# Patient Record
Sex: Female | Born: 1945 | Race: White | Hispanic: No | Smoking: Never smoker
Health system: Southern US, Community
[De-identification: ages and names within clinical notes are randomized; demographics above are authoritative.]

## PROBLEM LIST (undated history)

## (undated) DIAGNOSIS — K449 Diaphragmatic hernia without obstruction or gangrene: Secondary | ICD-10-CM

## (undated) DIAGNOSIS — R112 Nausea with vomiting, unspecified: Secondary | ICD-10-CM

## (undated) DIAGNOSIS — E785 Hyperlipidemia, unspecified: Secondary | ICD-10-CM

## (undated) DIAGNOSIS — K760 Fatty (change of) liver, not elsewhere classified: Secondary | ICD-10-CM

## (undated) DIAGNOSIS — J069 Acute upper respiratory infection, unspecified: Secondary | ICD-10-CM

## (undated) DIAGNOSIS — J018 Other acute sinusitis: Secondary | ICD-10-CM

## (undated) DIAGNOSIS — R0989 Other specified symptoms and signs involving the circulatory and respiratory systems: Secondary | ICD-10-CM

## (undated) DIAGNOSIS — I10 Essential (primary) hypertension: Secondary | ICD-10-CM

## (undated) DIAGNOSIS — Z8719 Personal history of other diseases of the digestive system: Secondary | ICD-10-CM

## (undated) DIAGNOSIS — H269 Unspecified cataract: Secondary | ICD-10-CM

## (undated) DIAGNOSIS — M1990 Unspecified osteoarthritis, unspecified site: Secondary | ICD-10-CM

## (undated) DIAGNOSIS — G4700 Insomnia, unspecified: Secondary | ICD-10-CM

## (undated) DIAGNOSIS — E7800 Pure hypercholesterolemia, unspecified: Secondary | ICD-10-CM

## (undated) DIAGNOSIS — R5381 Other malaise: Secondary | ICD-10-CM

## (undated) DIAGNOSIS — K648 Other hemorrhoids: Secondary | ICD-10-CM

## (undated) DIAGNOSIS — Z0000 Encounter for general adult medical examination without abnormal findings: Secondary | ICD-10-CM

## (undated) DIAGNOSIS — R5383 Other fatigue: Secondary | ICD-10-CM

## (undated) DIAGNOSIS — F5104 Psychophysiologic insomnia: Secondary | ICD-10-CM

## (undated) DIAGNOSIS — Z1211 Encounter for screening for malignant neoplasm of colon: Secondary | ICD-10-CM

## (undated) DIAGNOSIS — H35372 Puckering of macula, left eye: Secondary | ICD-10-CM

## (undated) DIAGNOSIS — Z86010 Personal history of colonic polyps: Secondary | ICD-10-CM

## (undated) DIAGNOSIS — Z780 Asymptomatic menopausal state: Secondary | ICD-10-CM

## (undated) DIAGNOSIS — I499 Cardiac arrhythmia, unspecified: Secondary | ICD-10-CM

## (undated) DIAGNOSIS — I480 Paroxysmal atrial fibrillation: Secondary | ICD-10-CM

## (undated) DIAGNOSIS — G4730 Sleep apnea, unspecified: Secondary | ICD-10-CM

## (undated) DIAGNOSIS — R011 Cardiac murmur, unspecified: Secondary | ICD-10-CM

## (undated) DIAGNOSIS — G2581 Restless legs syndrome: Secondary | ICD-10-CM

## (undated) DIAGNOSIS — K5790 Diverticulosis of intestine, part unspecified, without perforation or abscess without bleeding: Secondary | ICD-10-CM

## (undated) DIAGNOSIS — J189 Pneumonia, unspecified organism: Secondary | ICD-10-CM

## (undated) DIAGNOSIS — K219 Gastro-esophageal reflux disease without esophagitis: Secondary | ICD-10-CM

## (undated) DIAGNOSIS — Z9889 Other specified postprocedural states: Secondary | ICD-10-CM

## (undated) DIAGNOSIS — Z13220 Encounter for screening for lipoid disorders: Secondary | ICD-10-CM

## (undated) DIAGNOSIS — K635 Polyp of colon: Secondary | ICD-10-CM

## (undated) HISTORY — DX: Fatty (change of) liver, not elsewhere classified: K76.0

## (undated) HISTORY — PX: COLONOSCOPY: SHX174

## (undated) HISTORY — DX: Other specified symptoms and signs involving the circulatory and respiratory systems: R09.89

## (undated) HISTORY — DX: Encounter for screening for lipoid disorders: Z13.220

## (undated) HISTORY — DX: Encounter for general adult medical examination without abnormal findings: Z00.00

## (undated) HISTORY — DX: Diverticulosis of intestine, part unspecified, without perforation or abscess without bleeding: K57.90

## (undated) HISTORY — PX: OTHER SURGICAL HISTORY: SHX169

## (undated) HISTORY — DX: Insomnia, unspecified: G47.00

## (undated) HISTORY — PX: TUBAL LIGATION: SHX77

## (undated) HISTORY — DX: Hyperlipidemia, unspecified: E78.5

## (undated) HISTORY — DX: Asymptomatic menopausal state: Z78.0

## (undated) HISTORY — DX: Personal history of colonic polyps: Z86.010

## (undated) HISTORY — PX: EYE SURGERY: SHX253

## (undated) HISTORY — DX: Other fatigue: R53.81

## (undated) HISTORY — PX: KNEE ARTHROSCOPY: SUR90

## (undated) HISTORY — DX: Sleep apnea, unspecified: G47.30

## (undated) HISTORY — DX: Diaphragmatic hernia without obstruction or gangrene: K44.9

## (undated) HISTORY — PX: UPPER GI ENDOSCOPY: SHX6162

## (undated) HISTORY — DX: Other acute sinusitis: J01.80

## (undated) HISTORY — DX: Polyp of colon: K63.5

## (undated) HISTORY — DX: Essential (primary) hypertension: I10

## (undated) HISTORY — PX: CHOLECYSTECTOMY: SHX55

## (undated) HISTORY — DX: Acute upper respiratory infection, unspecified: J06.9

## (undated) HISTORY — DX: Other malaise: R53.83

## (undated) HISTORY — DX: Puckering of macula, left eye: H35.372

## (undated) HISTORY — DX: Other hemorrhoids: K64.8

## (undated) HISTORY — PX: PARS PLANA VITRECTOMY W/ REPAIR OF MACULAR HOLE: SHX2170

## (undated) HISTORY — DX: Unspecified cataract: H26.9

## (undated) HISTORY — DX: Psychophysiologic insomnia: F51.04

## (undated) HISTORY — DX: Restless legs syndrome: G25.81

## (undated) HISTORY — DX: Paroxysmal atrial fibrillation: I48.0

## (undated) HISTORY — DX: Pure hypercholesterolemia, unspecified: E78.00

## (undated) HISTORY — DX: Encounter for screening for malignant neoplasm of colon: Z12.11

---

## 1999-09-15 ENCOUNTER — Other Ambulatory Visit: Admission: RE | Admit: 1999-09-15 | Discharge: 1999-09-15 | Payer: Self-pay | Admitting: Obstetrics and Gynecology

## 1999-09-17 ENCOUNTER — Encounter: Payer: Self-pay | Admitting: Obstetrics and Gynecology

## 1999-09-17 ENCOUNTER — Ambulatory Visit (HOSPITAL_COMMUNITY): Admission: RE | Admit: 1999-09-17 | Discharge: 1999-09-17 | Payer: Self-pay | Admitting: Obstetrics and Gynecology

## 1999-09-19 DIAGNOSIS — D259 Leiomyoma of uterus, unspecified: Secondary | ICD-10-CM

## 1999-09-19 HISTORY — DX: Leiomyoma of uterus, unspecified: D25.9

## 1999-11-18 ENCOUNTER — Encounter (INDEPENDENT_AMBULATORY_CARE_PROVIDER_SITE_OTHER): Payer: Self-pay | Admitting: Specialist

## 1999-11-18 ENCOUNTER — Other Ambulatory Visit: Admission: RE | Admit: 1999-11-18 | Discharge: 1999-11-18 | Payer: Self-pay | Admitting: Obstetrics and Gynecology

## 2001-07-11 ENCOUNTER — Other Ambulatory Visit: Admission: RE | Admit: 2001-07-11 | Discharge: 2001-07-11 | Payer: Self-pay | Admitting: Obstetrics and Gynecology

## 2003-09-25 ENCOUNTER — Other Ambulatory Visit: Admission: RE | Admit: 2003-09-25 | Discharge: 2003-09-25 | Payer: Self-pay | Admitting: Obstetrics and Gynecology

## 2004-08-04 ENCOUNTER — Ambulatory Visit (HOSPITAL_BASED_OUTPATIENT_CLINIC_OR_DEPARTMENT_OTHER): Admission: RE | Admit: 2004-08-04 | Discharge: 2004-08-04 | Payer: Self-pay | Admitting: Family Medicine

## 2004-09-23 ENCOUNTER — Ambulatory Visit: Payer: Self-pay | Admitting: Pulmonary Disease

## 2005-04-07 HISTORY — PX: OTHER SURGICAL HISTORY: SHX169

## 2005-04-18 ENCOUNTER — Ambulatory Visit: Payer: Self-pay | Admitting: Family Medicine

## 2005-04-19 ENCOUNTER — Ambulatory Visit: Payer: Self-pay | Admitting: Family Medicine

## 2005-04-21 ENCOUNTER — Ambulatory Visit: Payer: Self-pay

## 2005-06-10 ENCOUNTER — Other Ambulatory Visit: Admission: RE | Admit: 2005-06-10 | Discharge: 2005-06-10 | Payer: Self-pay | Admitting: Obstetrics and Gynecology

## 2006-03-24 ENCOUNTER — Ambulatory Visit: Payer: Self-pay | Admitting: Family Medicine

## 2006-07-08 HISTORY — PX: OTHER SURGICAL HISTORY: SHX169

## 2006-08-01 ENCOUNTER — Ambulatory Visit (HOSPITAL_COMMUNITY): Admission: RE | Admit: 2006-08-01 | Discharge: 2006-08-02 | Payer: Self-pay | Admitting: Orthopaedic Surgery

## 2006-09-22 ENCOUNTER — Ambulatory Visit: Payer: Self-pay

## 2006-10-09 ENCOUNTER — Ambulatory Visit: Payer: Self-pay | Admitting: Cardiology

## 2006-10-18 ENCOUNTER — Ambulatory Visit: Payer: Self-pay

## 2006-10-18 ENCOUNTER — Encounter: Payer: Self-pay | Admitting: Internal Medicine

## 2006-10-26 ENCOUNTER — Ambulatory Visit: Payer: Self-pay | Admitting: Cardiology

## 2006-12-20 HISTORY — PX: OTHER SURGICAL HISTORY: SHX169

## 2006-12-21 ENCOUNTER — Encounter: Payer: Self-pay | Admitting: Internal Medicine

## 2006-12-22 ENCOUNTER — Ambulatory Visit: Payer: Self-pay | Admitting: Internal Medicine

## 2006-12-28 ENCOUNTER — Ambulatory Visit: Payer: Self-pay | Admitting: Family Medicine

## 2006-12-29 ENCOUNTER — Ambulatory Visit: Payer: Self-pay | Admitting: Family Medicine

## 2007-01-01 ENCOUNTER — Ambulatory Visit: Payer: Self-pay | Admitting: Family Medicine

## 2007-01-03 ENCOUNTER — Ambulatory Visit: Payer: Self-pay | Admitting: Internal Medicine

## 2007-01-08 ENCOUNTER — Ambulatory Visit: Payer: Self-pay | Admitting: Cardiology

## 2007-01-15 ENCOUNTER — Ambulatory Visit: Payer: Self-pay | Admitting: Internal Medicine

## 2007-01-22 ENCOUNTER — Ambulatory Visit: Payer: Self-pay | Admitting: Cardiology

## 2007-02-13 ENCOUNTER — Ambulatory Visit: Payer: Self-pay | Admitting: Cardiology

## 2007-02-16 ENCOUNTER — Emergency Department (HOSPITAL_COMMUNITY): Admission: EM | Admit: 2007-02-16 | Discharge: 2007-02-16 | Payer: Self-pay | Admitting: Emergency Medicine

## 2007-03-07 ENCOUNTER — Ambulatory Visit: Payer: Self-pay | Admitting: Internal Medicine

## 2007-04-05 ENCOUNTER — Ambulatory Visit: Payer: Self-pay | Admitting: *Deleted

## 2007-05-21 ENCOUNTER — Encounter: Payer: Self-pay | Admitting: Internal Medicine

## 2007-05-21 DIAGNOSIS — G4730 Sleep apnea, unspecified: Secondary | ICD-10-CM | POA: Insufficient documentation

## 2007-05-21 DIAGNOSIS — G4700 Insomnia, unspecified: Secondary | ICD-10-CM | POA: Insufficient documentation

## 2007-05-21 DIAGNOSIS — G2581 Restless legs syndrome: Secondary | ICD-10-CM | POA: Insufficient documentation

## 2007-05-24 ENCOUNTER — Ambulatory Visit: Payer: Self-pay | Admitting: Cardiology

## 2007-06-05 ENCOUNTER — Ambulatory Visit: Payer: Self-pay | Admitting: Cardiology

## 2007-07-11 ENCOUNTER — Ambulatory Visit: Payer: Self-pay | Admitting: Internal Medicine

## 2007-08-08 ENCOUNTER — Ambulatory Visit: Payer: Self-pay | Admitting: Cardiology

## 2007-11-22 ENCOUNTER — Ambulatory Visit: Payer: Self-pay | Admitting: Internal Medicine

## 2007-12-25 ENCOUNTER — Ambulatory Visit: Payer: Self-pay | Admitting: Cardiology

## 2008-01-11 ENCOUNTER — Ambulatory Visit: Payer: Self-pay | Admitting: Family Medicine

## 2008-01-22 ENCOUNTER — Ambulatory Visit: Payer: Self-pay | Admitting: Internal Medicine

## 2008-04-29 ENCOUNTER — Ambulatory Visit: Payer: Self-pay | Admitting: Cardiology

## 2008-05-27 ENCOUNTER — Ambulatory Visit: Payer: Self-pay | Admitting: Cardiology

## 2008-06-25 ENCOUNTER — Ambulatory Visit: Payer: Self-pay | Admitting: Cardiology

## 2008-07-16 ENCOUNTER — Ambulatory Visit: Payer: Self-pay | Admitting: Internal Medicine

## 2008-07-17 ENCOUNTER — Ambulatory Visit: Payer: Self-pay | Admitting: Internal Medicine

## 2008-07-25 DIAGNOSIS — I10 Essential (primary) hypertension: Secondary | ICD-10-CM | POA: Insufficient documentation

## 2008-07-28 ENCOUNTER — Ambulatory Visit: Payer: Self-pay | Admitting: Internal Medicine

## 2008-07-28 LAB — CONVERTED CEMR LAB
Albumin: 3.7 g/dL (ref 3.5–5.2)
Alkaline Phosphatase: 77 units/L (ref 39–117)
Amylase: 55 units/L (ref 27–131)
Basophils Absolute: 0 10*3/uL (ref 0.0–0.1)
Basophils Relative: 0.6 % (ref 0.0–3.0)
Eosinophils Absolute: 0.1 10*3/uL (ref 0.0–0.7)
Eosinophils Relative: 2 % (ref 0.0–5.0)
HCT: 40.1 % (ref 36.0–46.0)
Hemoglobin: 13.9 g/dL (ref 12.0–15.0)
MCHC: 34.8 g/dL (ref 30.0–36.0)
MCV: 90.5 fL (ref 78.0–100.0)
Neutro Abs: 3.2 10*3/uL (ref 1.4–7.7)
Neutrophils Relative %: 46.1 % (ref 43.0–77.0)
RBC: 4.43 M/uL (ref 3.87–5.11)
Total Protein: 7 g/dL (ref 6.0–8.3)
WBC: 6.7 10*3/uL (ref 4.5–10.5)

## 2008-07-31 ENCOUNTER — Ambulatory Visit (HOSPITAL_COMMUNITY): Admission: RE | Admit: 2008-07-31 | Discharge: 2008-07-31 | Payer: Self-pay | Admitting: Internal Medicine

## 2008-08-04 ENCOUNTER — Ambulatory Visit: Payer: Self-pay | Admitting: Internal Medicine

## 2008-08-04 ENCOUNTER — Encounter: Payer: Self-pay | Admitting: Internal Medicine

## 2008-08-05 ENCOUNTER — Telehealth: Payer: Self-pay | Admitting: Internal Medicine

## 2008-08-06 ENCOUNTER — Encounter: Payer: Self-pay | Admitting: Internal Medicine

## 2008-08-06 DIAGNOSIS — K8020 Calculus of gallbladder without cholecystitis without obstruction: Secondary | ICD-10-CM | POA: Insufficient documentation

## 2008-09-01 ENCOUNTER — Telehealth: Payer: Self-pay | Admitting: Internal Medicine

## 2008-09-01 ENCOUNTER — Ambulatory Visit: Payer: Self-pay | Admitting: Cardiology

## 2008-09-01 ENCOUNTER — Encounter: Payer: Self-pay | Admitting: Internal Medicine

## 2009-01-29 ENCOUNTER — Encounter: Payer: Self-pay | Admitting: Internal Medicine

## 2009-02-27 ENCOUNTER — Ambulatory Visit (HOSPITAL_COMMUNITY): Admission: RE | Admit: 2009-02-27 | Discharge: 2009-02-28 | Payer: Self-pay | Admitting: General Surgery

## 2009-02-27 ENCOUNTER — Encounter (INDEPENDENT_AMBULATORY_CARE_PROVIDER_SITE_OTHER): Payer: Self-pay | Admitting: General Surgery

## 2009-03-23 ENCOUNTER — Telehealth: Payer: Self-pay | Admitting: Internal Medicine

## 2009-03-23 ENCOUNTER — Encounter: Payer: Self-pay | Admitting: Internal Medicine

## 2009-03-24 ENCOUNTER — Ambulatory Visit: Payer: Self-pay | Admitting: Internal Medicine

## 2009-04-08 ENCOUNTER — Encounter: Payer: Self-pay | Admitting: *Deleted

## 2009-04-14 ENCOUNTER — Ambulatory Visit: Payer: Self-pay | Admitting: Cardiology

## 2009-04-14 LAB — CONVERTED CEMR LAB: POC INR: 2.5

## 2009-05-13 ENCOUNTER — Encounter: Payer: Self-pay | Admitting: *Deleted

## 2009-05-18 ENCOUNTER — Ambulatory Visit: Payer: Self-pay | Admitting: Cardiology

## 2009-05-18 ENCOUNTER — Encounter (INDEPENDENT_AMBULATORY_CARE_PROVIDER_SITE_OTHER): Payer: Self-pay | Admitting: Internal Medicine

## 2009-05-18 ENCOUNTER — Encounter (INDEPENDENT_AMBULATORY_CARE_PROVIDER_SITE_OTHER): Payer: Self-pay | Admitting: Cardiology

## 2009-05-18 LAB — CONVERTED CEMR LAB: Prothrombin Time: 20.4 s

## 2009-05-22 ENCOUNTER — Telehealth: Payer: Self-pay | Admitting: Internal Medicine

## 2009-06-15 ENCOUNTER — Ambulatory Visit: Payer: Self-pay | Admitting: Internal Medicine

## 2009-06-15 LAB — CONVERTED CEMR LAB: POC INR: 3.8

## 2009-08-11 ENCOUNTER — Ambulatory Visit: Payer: Self-pay | Admitting: Cardiology

## 2009-08-11 LAB — CONVERTED CEMR LAB: POC INR: 2.3

## 2009-11-15 ENCOUNTER — Emergency Department (HOSPITAL_COMMUNITY): Admission: EM | Admit: 2009-11-15 | Discharge: 2009-11-16 | Payer: Self-pay | Admitting: Emergency Medicine

## 2009-11-28 ENCOUNTER — Telehealth (INDEPENDENT_AMBULATORY_CARE_PROVIDER_SITE_OTHER): Payer: Self-pay | Admitting: Physician Assistant

## 2009-12-02 ENCOUNTER — Ambulatory Visit: Payer: Self-pay | Admitting: Internal Medicine

## 2009-12-10 ENCOUNTER — Ambulatory Visit: Payer: Self-pay | Admitting: Family Medicine

## 2009-12-22 ENCOUNTER — Other Ambulatory Visit: Admission: RE | Admit: 2009-12-22 | Discharge: 2009-12-22 | Payer: Self-pay | Admitting: Family Medicine

## 2009-12-22 ENCOUNTER — Ambulatory Visit: Payer: Self-pay | Admitting: Family Medicine

## 2009-12-22 DIAGNOSIS — R5383 Other fatigue: Secondary | ICD-10-CM | POA: Insufficient documentation

## 2009-12-23 LAB — CONVERTED CEMR LAB
ALT: 23 units/L (ref 0–35)
Bilirubin, Direct: 0.1 mg/dL (ref 0.0–0.3)
Chloride: 109 meq/L (ref 96–112)
Direct LDL: 126.1 mg/dL
GFR calc non Af Amer: 76.4 mL/min (ref >60)
HDL: 48 mg/dL (ref >39.00)
Potassium: 4 meq/L (ref 3.5–5.1)
Sodium: 143 meq/L (ref 135–145)
Total Bilirubin: 0.5 mg/dL (ref 0.3–1.2)
Total Protein: 6.1 g/dL (ref 6.0–8.3)
Triglycerides: 164 mg/dL — ABNORMAL HIGH (ref 0.0–149.0)
VLDL: 32.8 mg/dL (ref 0.0–40.0)

## 2009-12-24 ENCOUNTER — Encounter (INDEPENDENT_AMBULATORY_CARE_PROVIDER_SITE_OTHER): Payer: Self-pay | Admitting: *Deleted

## 2009-12-24 LAB — CONVERTED CEMR LAB: Pap Smear: NEGATIVE

## 2010-03-26 ENCOUNTER — Encounter (INDEPENDENT_AMBULATORY_CARE_PROVIDER_SITE_OTHER): Payer: Self-pay | Admitting: Pharmacist

## 2010-04-08 ENCOUNTER — Ambulatory Visit: Payer: Self-pay | Admitting: Cardiovascular Disease

## 2010-04-08 LAB — CONVERTED CEMR LAB: POC INR: 1.1

## 2010-04-14 ENCOUNTER — Ambulatory Visit: Payer: Self-pay | Admitting: Cardiovascular Disease

## 2010-04-14 LAB — CONVERTED CEMR LAB: POC INR: 1.9

## 2010-04-23 ENCOUNTER — Ambulatory Visit: Payer: Self-pay | Admitting: Cardiology

## 2010-04-29 ENCOUNTER — Ambulatory Visit: Payer: Self-pay | Admitting: Internal Medicine

## 2010-05-18 ENCOUNTER — Ambulatory Visit: Payer: Self-pay | Admitting: Cardiology

## 2010-06-01 ENCOUNTER — Ambulatory Visit: Payer: Self-pay | Admitting: Cardiovascular Disease

## 2010-06-16 ENCOUNTER — Telehealth: Payer: Self-pay | Admitting: Internal Medicine

## 2010-07-20 ENCOUNTER — Ambulatory Visit: Payer: Self-pay | Admitting: Internal Medicine

## 2010-07-22 ENCOUNTER — Ambulatory Visit: Payer: Self-pay | Admitting: Cardiology

## 2010-07-29 ENCOUNTER — Telehealth: Payer: Self-pay | Admitting: Internal Medicine

## 2010-07-29 ENCOUNTER — Ambulatory Visit: Payer: Self-pay | Admitting: Internal Medicine

## 2010-08-03 ENCOUNTER — Encounter: Payer: Self-pay | Admitting: Internal Medicine

## 2010-08-10 ENCOUNTER — Ambulatory Visit: Payer: Self-pay | Admitting: Family Medicine

## 2010-08-10 ENCOUNTER — Ambulatory Visit: Payer: Self-pay | Admitting: Cardiology

## 2010-08-10 DIAGNOSIS — E7800 Pure hypercholesterolemia, unspecified: Secondary | ICD-10-CM | POA: Insufficient documentation

## 2010-08-10 LAB — CONVERTED CEMR LAB: POC INR: 1.8

## 2010-08-11 ENCOUNTER — Encounter: Payer: Self-pay | Admitting: Family Medicine

## 2010-08-11 LAB — CONVERTED CEMR LAB
Cholesterol: 208 mg/dL — ABNORMAL HIGH (ref 0–200)
HDL: 46.8 mg/dL (ref >39.00)
Total CHOL/HDL Ratio: 4
Triglycerides: 142 mg/dL (ref 0.0–149.0)
VLDL: 28.4 mg/dL (ref 0.0–40.0)

## 2010-09-13 ENCOUNTER — Ambulatory Visit: Payer: Self-pay | Admitting: Cardiovascular Disease

## 2010-10-11 ENCOUNTER — Ambulatory Visit: Payer: Self-pay | Admitting: Internal Medicine

## 2010-10-29 ENCOUNTER — Ambulatory Visit: Payer: Self-pay | Admitting: Physician Assistant

## 2010-10-29 ENCOUNTER — Telehealth: Payer: Self-pay | Admitting: Physician Assistant

## 2010-10-29 LAB — CONVERTED CEMR LAB
Eosinophils Absolute: 0.1 10*3/uL (ref 0.0–0.7)
Eosinophils Relative: 1.4 % (ref 0.0–5.0)
HCT: 37.6 % (ref 36.0–46.0)
Lymphs Abs: 2.6 10*3/uL (ref 0.7–4.0)
MCHC: 34.7 g/dL (ref 30.0–36.0)
MCV: 90.5 fL (ref 78.0–100.0)
Monocytes Absolute: 0.6 10*3/uL (ref 0.1–1.0)
Platelets: 194 10*3/uL (ref 150.0–400.0)
RDW: 12.9 % (ref 11.5–14.6)

## 2010-11-03 ENCOUNTER — Ambulatory Visit: Payer: Self-pay | Admitting: Internal Medicine

## 2010-11-03 DIAGNOSIS — K449 Diaphragmatic hernia without obstruction or gangrene: Secondary | ICD-10-CM | POA: Insufficient documentation

## 2010-11-03 DIAGNOSIS — K5730 Diverticulosis of large intestine without perforation or abscess without bleeding: Secondary | ICD-10-CM | POA: Insufficient documentation

## 2010-11-03 DIAGNOSIS — K625 Hemorrhage of anus and rectum: Secondary | ICD-10-CM

## 2010-11-03 DIAGNOSIS — Z8719 Personal history of other diseases of the digestive system: Secondary | ICD-10-CM

## 2010-11-03 DIAGNOSIS — K648 Other hemorrhoids: Secondary | ICD-10-CM | POA: Insufficient documentation

## 2010-11-04 DIAGNOSIS — Z860100 Personal history of colon polyps, unspecified: Secondary | ICD-10-CM | POA: Insufficient documentation

## 2010-11-16 ENCOUNTER — Ambulatory Visit: Admission: RE | Admit: 2010-11-16 | Discharge: 2010-11-16 | Payer: Self-pay | Source: Home / Self Care

## 2010-11-16 LAB — CONVERTED CEMR LAB: POC INR: 2.8

## 2010-12-07 NOTE — Assessment & Plan Note (Signed)
Summary: discuss colon on BT--ch.   History of Present Illness Primary GI MD: Lina Sar MD Primary Provider: Ruthe Mannan, MD  Requesting Provider: na Chief Complaint: Consult colon. Pt is on Warfarin. Pt  denies any GI complaints  History of Present Illness:   This is a 65 showed white female who comes to discuss a colonoscopy. She had an appointment in March of this year and was a no-show but she says that nobody gave her the appointment information which she really wanted to have early in the year. Clearly, there was a communication problem. She denies any GI symptoms and there is no family history of colon cancer. We have seen her for upper GI problems including cholelithiasis for which she is status post cholecystectomy in 2009 and an esophageal stricture. An upper endoscopy in September 2009 showed a mild stricture which was dilated with a 48 [place] Maloney dilator. She has a 2 cm hiatal hernia. She has been taking omeprazole 20 mg daily. Patient has paroxysmal atrial fibrillation for which she takes Coumadin. Her Coumadin was discontinued for gallbladder surgery as well as for her last endoscopy.   GI Review of Systems      Denies abdominal pain, acid reflux, belching, bloating, chest pain, dysphagia with liquids, dysphagia with solids, heartburn, loss of appetite, nausea, vomiting, vomiting blood, weight loss, and  weight gain.        Denies anal fissure, black tarry stools, change in bowel habit, constipation, diarrhea, diverticulosis, fecal incontinence, heme positive stool, hemorrhoids, irritable bowel syndrome, jaundice, light color stool, liver problems, rectal bleeding, and  rectal pain.    Current Medications (verified): 1)  Toprol Xl 50 Mg  Tb24 (Metoprolol Succinate) .... Take 1 Tablet By Mouth Once A Day 2)  Propafenone Hcl 225 Mg Tabs (Propafenone Hcl) .... Take One Tablet By Mouth Bid 3)  Warfarin Sodium 5 Mg Tabs (Warfarin Sodium) .... Use As Directed By Anticoagulation  Clinic. 4)  Cozaar 100 Mg Tabs (Losartan Potassium) .Marland Kitchen.. 1 By Mouth Once Daily 5)  Ambien 10 Mg Tabs (Zolpidem Tartrate) .... Take 1 Tab By Mouth At Bedtime 6)  Omeprazole 40 Mg Cpdr (Omeprazole) .... Take 1 Tablet By Mouth Once A Day  Must Have Office Visit For Further Refills!  Allergies (verified): 1)  ! Codeine 2)  ! * Synthetic Codeine  Past History:  Past Medical History: Reviewed history from 12/02/2009 and no changes required. 1. ABDOMINAL PAIN, UNSPECIFIED SITE  2. NAUSEA 3. HYPERTENSION 4. URI 5. ATRIAL FIBRILLATION, PAROXYSMAL  11/07     --dobutamine cardiac MRI at [facility] - normal 6. INSOMNIA, CHRONIC  7. SLEEP APNEA  8. RESTLESS LEG SYNDROME   Past Surgical History: Reviewed history from 07/25/2008 and no changes required. cardio vascular stress MRI,  a.fib 12/20/2006 ORIF L) distal radius 9/07 cardiolyte neg 6/06 Tubal Ligation  Family History: Family History of Breast Cancer: Mother Family History of Heart Disease: Father Family History of Diabetes:  Cousins No FH of Colon Cancer:  Social History: Reviewed history from 04/13/2009 and no changes required. Occupation: Catering manager Alcohol Use - yes-on occasion Daily Caffeine Use- 1-2 daily Illicit Drug Use - no Married  Tobacco Use - No.   Review of Systems  The patient denies allergy/sinus, anemia, anxiety-new, arthritis/joint pain, back pain, blood in urine, breast changes/lumps, change in vision, confusion, cough, coughing up blood, depression-new, fainting, fatigue, fever, headaches-new, hearing problems, heart murmur, heart rhythm changes, itching, menstrual pain, muscle pains/cramps, night sweats, nosebleeds, pregnancy symptoms, shortness of breath,  skin rash, sleeping problems, sore throat, swelling of feet/legs, swollen lymph glands, thirst - excessive , urination - excessive , urination changes/pain, urine leakage, vision changes, and voice change.         Pertinent positive and negative review of  systems were noted in the above HPI. All other ROS was otherwise negative.   Vital Signs:  Patient profile:   65 year old female Height:      62 inches Weight:      200 pounds BMI:     36.71 BSA:     1.91 Pulse rate:   72 / minute Pulse rhythm:   regular BP sitting:   132 / 80  (left arm) Cuff size:   regular  Vitals Entered By: Ok Anis CMA (July 20, 2010 10:10 AM)  Physical Exam  General:  Well developed, well nourished, no acute distress. Eyes:  PERRLA, no icterus. Mouth:  No deformity or lesions, dentition normal. Lungs:  Clear throughout to auscultation. Heart:  Regular rate and rhythm; no murmurs, rubs,  or bruits. Abdomen:  multiple well healed surgical scars below the umbilicus. Normoactive bowel sounds. No tenderness. Extremities:  No clubbing, cyanosis, edema or deformities noted. Skin:  Intact without significant lesions or rashes. Psych:  Alert and cooperative. Normal mood and affect.   Impression & Recommendations:  Problem # 1:  SPECIAL SCREENING FOR MALIGNANT NEOPLASMS COLON (ICD-V76.51)  Patient is a suitable candidate for a screening colonoscopy. There are no risk factors. She will discontinue Coumadin 5 days prior to the procedure. We have discussed stopping the Coumadin and the risks and the benefits.  Orders: Colonoscopy (Colon)  Problem # 2:  CHOLELITHIASIS (ICD-574.20) Patient is status post laparoscopic cholecystectomy in 2009. Patient remains asymptomatic.  Problem # 3:  ATRIAL FIBRILLATION, PAROXYSMAL  11/07 (ICD-427.31) Patient is on chronic Coumadin therapy. Coumadin will be discontinued 5 days prior to colonoscopy.  Patient Instructions: 1)  screening colonoscopy using Moviprep. 2)  Hold Coumadin 5 days prior to the procedure. 3)  Copy sent to : Dr T.Aron 4)  The medication list was reviewed and reconciled.  All changed / newly prescribed medications were explained.  A complete medication list was provided to the patient /  caregiver. Prescriptions: MOVIPREP 100 GM  SOLR (PEG-KCL-NACL-NASULF-NA ASC-C) As per prep instructions.  #1 x 0   Entered by:   Lamona Curl CMA (AAMA)   Authorized by:   Hart Carwin MD   Signed by:   Lamona Curl CMA (AAMA) on 07/20/2010   Method used:   [facility]        [facility]  [place]       [address]       [place], [place]  [address]       Ph: [phone] or [phone]       Fax: [phone]   RxID:   [id number]

## 2010-12-07 NOTE — Assessment & Plan Note (Signed)
Summary: ROV/AMD   Visit Type:  ROV Primary Provider:  Joycelyn Man  CC:  no complaints.  History of Present Illness: [name] is a very pleasant 65 year old woman with a history of hypertension, glucose intolerance, sleep apnea, and paroxysmal atrial fibrillation maintaining sinus rhythm on propafenone.  She previously underwent a dobutamine cardiac MRI at [facility] in 2007 which was normal.    Has been doing realtively well except for a recent URI. Continues to take propafenone and coumadin without any difficulty. INRs have been well controlled. No palpitations, CP or HF symptoms. About to participate in a bp study at [facility]. Not exercising regulalrly. +cough. No fevers/chills/sputum.   Current Medications (verified): 1)  Toprol Xl 50 Mg  Tb24 (Metoprolol Succinate) .... Take 1 Tablet By Mouth Once A Day 2)  Propafenone Hcl 225 Mg Tabs (Propafenone Hcl) .... Take One Tablet By Mouth Bid 3)  Warfarin Sodium 5 Mg Tabs (Warfarin Sodium) .... Use As Directed By Anticoagulation Clinic. 4)  Cozaar 100 Mg Tabs (Losartan Potassium) .Marland Kitchen.. 1 By Mouth Once Daily  Allergies (verified): 1)  ! Codeine 2)  ! * Synthetic Codeine  Past History:  Past Medical History: 1. ABDOMINAL PAIN, UNSPECIFIED SITE  2. NAUSEA 3. HYPERTENSION 4. URI 5. ATRIAL FIBRILLATION, PAROXYSMAL  11/07     --dobutamine cardiac MRI at [facility] - normal 6. INSOMNIA, CHRONIC  7. SLEEP APNEA  8. RESTLESS LEG SYNDROME   Review of Systems       As per HPI and past medical history; otherwise all systems negative.   Vital Signs:  Patient profile:   65 year old female Height:      62 inches Weight:      192 pounds BMI:     35.24 Pulse rate:   74 / minute Pulse rhythm:   regular BP sitting:   132 / 70  (left arm) Cuff size:   regular  Vitals Entered By: Mercer Pod (December 02, 2009 2:21 PM)  Physical Exam  General:  Gen: well appearing. no resp difficulty. +cough HEENT: normal Neck:  supple. no JVD. Carotids 2+ bilat; no bruits. No lymphadenopathy or thryomegaly appreciated. Cor: PMI nondisplaced. Regular rate & rhythm. No rubs, gallops, murmur. Lungs: clear Abdomen: obese. soft, nontender, nondistended. No hepatosplenomegaly. No bruits or masses. Good bowel sounds. Extremities: no cyanosis, clubbing, rash, edema Neuro: alert & orientedx3, cranial nerves grossly intact. moves all 4 extremities w/o difficulty. affect pleasant    Impression & Recommendations:  Problem # 1:  ATRIAL FIBRILLATION, PAROXYSMAL  11/07 (ICD-427.31) Maintaining sinus rhythm on propafenone. No problems with coumadin. We did discuss pros/cons of switching to Pradaxa and we will call her insurance company to find out cost for her and get back to her.   Appended Document: R[facility], 1 month supply of pradaxa at local pharmacy is $75, 3 month mail order is $225.  pt informed. Charlena Cross RN BSN

## 2010-12-07 NOTE — Medication Information (Signed)
Summary: CCR/AMD  Anticoagulant Therapy  Managed by: Sherri Rad, RN, BSN Referring MD: Nona Dell PCP: Joycelyn Man Supervising MD: Julien Nordmann Indication 1: Atrial Fibrillation (ICD-427.31) Lab Used: [facility] [place] Site: [place] INR POC 1.1 INR RANGE 2 - 3  Dietary changes: no    Health status changes: no    Bleeding/hemorrhagic complications: no    Recent/future hospitalizations: no    Any changes in medication regimen? no    Recent/future dental: no  Any missed doses?: yes     Details: The patient missed a dose 3 days ago. She thinks she may have missed more doses over the last few weeks.  Is patient compliant with meds? yes       Allergies: 1)  ! Codeine 2)  ! * Synthetic Codeine  Anticoagulation Management History:      Positive risk factors for bleeding include an age of 67 years or older.  The bleeding index is 'intermediate risk'.  Positive CHADS2 values include History of HTN.  Negative CHADS2 values include Age > 67 years old.  The start date was 12/20/2006.  Anticoagulation responsible provider: Julien Nordmann.  INR POC: 1.1.  Cuvette Lot#: [id number].  Exp: 06/08/2011.    Anticoagulation Management Assessment/Plan:      The patient's current anticoagulation dose is Warfarin sodium 5 mg tabs: Use as directed by anticoagulation clinic..  The target INR is 2 - 3.  The next INR is due 04/15/2010.  Anticoagulation instructions were given to patient.  Results were reviewed/authorized by Sherri Rad, RN, BSN.  She was notified by Sherri Rad, RN, BSN.         Prior Anticoagulation Instructions: INR 2.3  Continue current dosing schedule. Take 1/2 tablet daily.  Current Anticoagulation Instructions: INR- 1.1  The patient took 2.5mg  this morning. She will take an extra 2.5mg  today, 5 mg tomorrow, then resume 2.5mg  once daily.  Recheck 1 week in the [place] office. Warfarin sodium 5 mg tabs: Use as directed by  anticoagulation clinic.Marland Kitchen

## 2010-12-07 NOTE — Progress Notes (Signed)
Summary: Triage  Phone Note Call from Patient Call back at Home Phone [phone]   Caller: Patient Call For: Dr. Juanda Chance Reason for Call: Talk to Nurse Summary of Call: pt. said she does not feel like she can finish the prep. She drunk 3/4 last night. Any suggestions? Initial call taken by: Karna Christmas,  July 29, 2010 8:06 AM  Follow-up for Phone Call        pt.stated that able to drink 3/4 of prep last night and stools are liquid tan. informed dr. Juanda Chance of pt. concerns and pt. instructed to drink prep at a slower pace and to start drinking at 9:00 a.m. and to notify us by 12 noon if she is unable to get any of prep down per dr. Juanda Chance, pt. informed and verbalize understanding. Follow-up by: Greer Ee RN,  July 29, 2010 8:23 AM

## 2010-12-07 NOTE — Letter (Signed)
Summary: Patient Notice- Polyp Results   Gastroenterology  [address] [place], [place] [address]   Phone: [phone]  Fax: [phone]        August 03, 2010 MRN: [id number]    MARLISA CARIDI [address] [place], [place]  [address]    Dear Ms. DSOUZA,  I am pleased to inform you that the colon polyp(s) removed during your recent colonoscopy was (were) found to be benign (no cancer detected) upon pathologic examination.The polyp was adenomatous ( precancerous) 5 I recommend you have a repeat colonoscopy examination in _ years to look for recurrent polyps, as having colon polyps increases your risk for having recurrent polyps or even colon cancer in the future.  Should you develop new or worsening symptoms of abdominal pain, bowel habit changes or bleeding from the rectum or bowels, please schedule an evaluation with either your primary care physician or with me.  Additional information/recommendations:  _x_ No further action with gastroenterology is needed at this time. Please      follow-up with your primary care physician for your other healthcare      needs.  __ Please call [phone] to schedule a return visit to review your      situation.  __ Please keep your follow-up visit as already scheduled.  __ Continue treatment plan as outlined the day of your exam.  Please call us if you are having persistent problems or have questions about your condition that have not been fully answered at this time.  Sincerely,  Hart Carwin MD  This letter has been electronically signed by your physician.  Appended Document: Patient Notice- Polyp Results letter mailed

## 2010-12-07 NOTE — Progress Notes (Signed)
Summary: Medication refill  Phone Note Call from Patient Call back at Home Phone [phone]   Caller: Patient Call For: Dr. Juanda Chance Reason for Call: Refill Medication Summary of Call: needs a refill on her Omeprazole....sch'd appt for 07-20-10.Marland KitchenMarland [facility] Initial call taken by: Karna Christmas,  June 16, 2010 3:20 PM  Follow-up for Phone Call        Prescription sent for patient to get enough medication until her appointment on 07/20/10. Follow-up by: Ronny Bacon CMA Duncan Dull),  June 16, 2010 3:25 PM    Prescriptions: OMEPRAZOLE 40 MG CPDR (OMEPRAZOLE) Take 1 tablet by mouth once a day  MUST HAVE OFFICE VISIT FOR FURTHER REFILLS!  #30 x 1   Entered by:   Ronny Bacon CMA (AAMA)   Authorized by:   Hart Carwin MD   Signed by:   Ronny Bacon CMA (AAMA) on 06/16/2010   Method used:   [facility]        [facility]  [place]       [address]       [place], [place]  [address]       Ph: [phone] or [phone]       Fax: [phone]   RxID:   [id number]

## 2010-12-07 NOTE — Medication Information (Signed)
Summary: rov/tm  Anticoagulant Therapy  Managed by: Weston Brass, PharmD Referring MD: Gala Romney PCP: Ruthe Mannan, MD  Supervising MD: Tenny Craw MD, Gunnar Fusi Indication 1: Atrial Fibrillation (ICD-427.31) Lab Used: [facility] [place] Site: [address] INR POC 2.5 INR RANGE 2 - 3  Dietary changes: no    Health status changes: no    Bleeding/hemorrhagic complications: no    Recent/future hospitalizations: no    Any changes in medication regimen? no    Recent/future dental: no  Any missed doses?: no       Is patient compliant with meds? yes       Allergies: 1)  ! Codeine 2)  ! * Synthetic Codeine  Anticoagulation Management History:      The patient is taking warfarin and comes in today for a routine follow up visit.  Positive risk factors for bleeding include an age of 36 years or older.  The bleeding index is 'intermediate risk'.  Positive CHADS2 values include History of HTN.  Negative CHADS2 values include Age > 85 years old.  The start date was 12/20/2006.  Anticoagulation responsible provider: Tenny Craw MD, Gunnar Fusi.  INR POC: 2.5.  Cuvette Lot#: [id number].  Exp: 10/2011.    Anticoagulation Management Assessment/Plan:      The patient's current anticoagulation dose is Warfarin sodium 5 mg tabs: Use as directed by anticoagulation clinic..  The target INR is 2 - 3.  The next INR is due 11/16/2010.  Anticoagulation instructions were given to patient.  Results were reviewed/authorized by Weston Brass, PharmD.  She was notified by Weston Brass PharmD.         Prior Anticoagulation Instructions: INR 2.0 Today take extra 2.5mg s then resume 2.5mg s everyday. Recheck in 4 weeks.   Current Anticoagulation Instructions: INR 2.5  Continue same dose of 1/2 tablet every day.  Recheck INR in 4 weeks.

## 2010-12-07 NOTE — Letter (Signed)
Summary: Generic Letter  [place] [facility]  [address] [place], [place] [address]   Phone: [phone]  Fax: [phone]    08/11/2010  [name] [address] [place], [place]  [address]  Dear [name],   We have received your lab results and Dr. Dayton Martes says that your triglycerides and good cholesterol (HDL) look great!  Bad cholesterol, (LDL) only mildly elevated.  Decrease added sugars, eliminate trans fats, increase fiber and limit alcohol.  We will recheck this next year.         Sincerely,        Linde Gillis CMA (AAMA)for Dr. Ruthe Mannan

## 2010-12-07 NOTE — Progress Notes (Signed)
  Phone Note Call from Patient   Call For: Dr Nicholes Mango Reason for Call: Refill Medication Action Taken: Rx Called In Summary of Call: Pt dtr called, pt has home in [place] and is down there. C/o tachypalps and does not have propafenone with her. Pt currently refusing eval and wants Rx. Advised dtr pt had not seen cardiac MD in > 25yr. Will call in temp Rx for propafenone and Toprol XL. Dtr promises she will make appt upon return. Initial call taken by: Park Breed PA-C,  November 28, 2009 10:17 AM

## 2010-12-07 NOTE — Letter (Signed)
Summary: [facility] - Delinquent Coumadin 1  Coumadin  [address]   [place], [place] [address]   Phone: [phone]  Fax: [phone]     Mar 26, 2010 MRN: [id number]   [name] [address] [place], [place]  [address]   Dear Ms. SACCA,  This letter is being sent to you as a reminder that it is necessary for you to get your INR/PT checked regularly so that we can optimize your care.  Our records indicate that you were scheduled to have a test done recently.  As of today, we have not received the results of this test.  It is very important that you have your INR checked.  Please call our office at the number listed above to schedule an appointment at your earliest convenience.    If you have recently had your protime checked or have discontinued this medication, please contact our office at the above phone number to clarify this issue.  Thank you for this prompt attention to this important health care matter.  Sincerely,   [facility] Team

## 2010-12-07 NOTE — Medication Information (Signed)
Summary: rov/sp  Anticoagulant Therapy  Managed by: Cloyde Reams, RN, BSN Referring MD: Gala Romney PCP: Joycelyn Man Supervising MD: Johney Frame MD, Fayrene Fearing Indication 1: Atrial Fibrillation (ICD-427.31) Lab Used: [facility] [place] Site: [place] INR POC 1.8 INR RANGE 2 - 3  Dietary changes: no    Health status changes: no    Bleeding/hemorrhagic complications: no    Recent/future hospitalizations: no    Any changes in medication regimen? no    Recent/future dental: no  Any missed doses?: no       Is patient compliant with meds? yes       Allergies: 1)  ! Codeine 2)  ! * Synthetic Codeine  Anticoagulation Management History:      The patient is taking warfarin and comes in today for a routine follow up visit.  Positive risk factors for bleeding include an age of 23 years or older.  The bleeding index is 'intermediate risk'.  Positive CHADS2 values include History of HTN.  Negative CHADS2 values include Age > 64 years old.  The start date was 12/20/2006.  Anticoagulation responsible provider: Jhordan Kinter MD, Fayrene Fearing.  INR POC: 1.8.  Cuvette Lot#: [id number].  Exp: 07/2011.    Anticoagulation Management Assessment/Plan:      The patient's current anticoagulation dose is Warfarin sodium 5 mg tabs: Use as directed by anticoagulation clinic..  The target INR is 2 - 3.  The next INR is due 05/18/2010.  Anticoagulation instructions were given to patient.  Results were reviewed/authorized by Cloyde Reams, RN, BSN.  She was notified by Cloyde Reams RN.         Prior Anticoagulation Instructions: INR 3.4  Skip today's dose of Coumadin then decrease dose to 1/2 tablet every day.    Current Anticoagulation Instructions: INR 1.8  Take 1 tablet on Thursdays and 1/2 tablet all other days.  Recheck as soon as returns back to town.

## 2010-12-07 NOTE — Medication Information (Signed)
Summary: rov/sl  Anticoagulant Therapy  Managed by: Bethena Midget, RN, BSN Referring MD: Gala Romney PCP: Ruthe Mannan, MD  Supervising MD: Nahser MD, Philip Indication 1: Atrial Fibrillation (ICD-427.31) Lab Used: [facility] [place] Site: [address] INR RANGE 2 - 3  Dietary changes: no    Health status changes: no    Bleeding/hemorrhagic complications: no    Recent/future hospitalizations: no    Any changes in medication regimen? no    Recent/future dental: no  Any missed doses?: no       Is patient compliant with meds? yes       Allergies: 1)  ! Codeine 2)  ! * Synthetic Codeine  Anticoagulation Management History:      The patient is taking warfarin and comes in today for a routine follow up visit.  Positive risk factors for bleeding include an age of 67 years or older.  The bleeding index is 'intermediate risk'.  Positive CHADS2 values include History of HTN.  Negative CHADS2 values include Age > 67 years old.  The start date was 12/20/2006.  Anticoagulation responsible provider: Nahser MD, Loistine Chance.  Cuvette Lot#: [id number].  Exp: 09/2011.    Anticoagulation Management Assessment/Plan:      The patient's current anticoagulation dose is Warfarin sodium 5 mg tabs: Use as directed by anticoagulation clinic..  The target INR is 2 - 3.  The next INR is due 10/11/2010.  Anticoagulation instructions were given to patient.  Results were reviewed/authorized by Bethena Midget, RN, BSN.  She was notified by Bethena Midget, RN, BSN.         Prior Anticoagulation Instructions: INR 1.8  Tomorrow, Wednesday, October 5th, take Coumadin 1 tab (5 mg). Then, continue regular schedule of Coumadin 0.5 tab (2.5 mg) every day. Return to clinic in 3 weeks.   Current Anticoagulation Instructions: INR 2.0 Today take extra 2.5mg s then resume 2.5mg s everyday. Recheck in 4 weeks.

## 2010-12-07 NOTE — Letter (Signed)
Summary: Results Follow up Letter  [place] [facility]  [address] [place], [place] [address]   Phone: [phone]  Fax: [phone]    12/24/2009 MRN: [id number]    [name] [address] [place], [place]  [address]    Dear [name],  The following are the results of your recent test(s):  Test         Result    Pap Smear:        Normal ___X__  Not Normal _____ Comments: ______________________________________________________ Cholesterol: LDL(Bad cholesterol):         Your goal is less than:         HDL (Good cholesterol):       Your goal is more than: Comments:  ______________________________________________________ Mammogram:        Normal _____  Not Normal _____ Comments:  ___________________________________________________________________ Hemoccult:        Normal _____  Not normal _______ Comments:    _____________________________________________________________________ Other Tests:    We routinely do not discuss normal results over the telephone.  If you desire a copy of the results, or you have any questions about this information we can discuss them at your next office visit.   Sincerely,    Ruthe Mannan,  M.D.  TA:lsf

## 2010-12-07 NOTE — Medication Information (Signed)
Summary: rov/cb  Anticoagulant Therapy  Managed by: Weston Brass, PharmD Referring MD: Gala Romney PCP: Joycelyn Man Supervising MD: Juanda Chance MD, Aiana Nordquist Indication 1: Atrial Fibrillation (ICD-427.31) Lab Used: [facility] [place] Site: [place] INR POC 3.4 INR RANGE 2 - 3  Dietary changes: no    Health status changes: yes       Details: noticed BP has been changing dramatically from day to day (SBP from 120 to 200 in 1 day).  Asked her to start writing BP down and noting any changes in salt intake, etc to see if there is any correlation between her BP and lifestyle   Bleeding/hemorrhagic complications: no    Recent/future hospitalizations: no    Any changes in medication regimen? no    Recent/future dental: no  Any missed doses?: no       Is patient compliant with meds? yes       Allergies: 1)  ! Codeine 2)  ! * Synthetic Codeine  Anticoagulation Management History:      The patient is taking warfarin and comes in today for a routine follow up visit.  Positive risk factors for bleeding include an age of 67 years or older.  The bleeding index is 'intermediate risk'.  Positive CHADS2 values include History of HTN.  Negative CHADS2 values include Age > 67 years old.  The start date was 12/20/2006.  Anticoagulation responsible provider: Juanda Chance MD, Smitty Cords.  INR POC: 3.4.  Cuvette Lot#: [id number].  Exp: 06/08/2011.    Anticoagulation Management Assessment/Plan:      The patient's current anticoagulation dose is Warfarin sodium 5 mg tabs: Use as directed by anticoagulation clinic..  The target INR is 2 - 3.  The next INR is due 04/29/2010.  Anticoagulation instructions were given to patient.  Results were reviewed/authorized by Weston Brass, PharmD.  She was notified by Weston Brass PharmD.         Prior Anticoagulation Instructions: INR 1.9. Take an extra 1/2 tablet today, then take 1/2 tablet daily except 1 tablet on Tues.  Recheck 04/23/10.  Current Anticoagulation  Instructions: INR 3.4  Skip today's dose of Coumadin then decrease dose to 1/2 tablet every day.

## 2010-12-07 NOTE — Medication Information (Signed)
Summary: rov/ewj  Anticoagulant Therapy  Managed by: Weston Brass, PharmD Referring MD: Gala Romney PCP: Joycelyn Man Supervising MD: Eden Emms MD, Theron Arista Indication 1: Atrial Fibrillation (ICD-427.31) Lab Used: [facility] [place] Site: [place] INR POC 2.3 INR RANGE 2 - 3  Dietary changes: no    Health status changes: no    Bleeding/hemorrhagic complications: no    Recent/future hospitalizations: no    Any changes in medication regimen? no    Recent/future dental: no  Any missed doses?: no       Is patient compliant with meds? yes      Comments: pt is having a colonoscopy on 8/18. does not know yet whether coumadin will be held. instructed patient to call us when she finds out so we can change her follow-up appointment if needed  Allergies: 1)  ! Codeine 2)  ! * Synthetic Codeine  Anticoagulation Management History:      The patient is taking warfarin and comes in today for a routine follow up visit.  Positive risk factors for bleeding include an age of 67 years or older.  The bleeding index is 'intermediate risk'.  Positive CHADS2 values include History of HTN.  Negative CHADS2 values include Age > 67 years old.  The start date was 12/20/2006.  Anticoagulation responsible provider: Eden Emms MD, Theron Arista.  INR POC: 2.3.  Cuvette Lot#: [id number].  Exp: 08/2011.    Anticoagulation Management Assessment/Plan:      The patient's current anticoagulation dose is Warfarin sodium 5 mg tabs: Use as directed by anticoagulation clinic..  The target INR is 2 - 3.  The next INR is due 06/25/2010.  Anticoagulation instructions were given to patient.  Results were reviewed/authorized by Weston Brass, PharmD.  She was notified by Dillard Cannon.         Prior Anticoagulation Instructions: INR 1.7  Start taking 1/2 tablet daily except 1 tablet on Tuesdays and Thursdays.  Recheck in 2 weeks.    Current Anticoagulation Instructions: INR 2.3  Continue same regimen of 1/2 tab daily  except for 1 tab on Tuesday and Thursday.  Re-check in 3.5 weeks.

## 2010-12-07 NOTE — Assessment & Plan Note (Signed)
Summary: CPX  CYD   Vital Signs:  Patient profile:   65 year old female Height:      62 inches Weight:      198 pounds BMI:     36.35 Temp:     98.3 degrees F oral Pulse rate:   80 / minute Pulse rhythm:   regular BP sitting:   140 / 76  (left arm) Cuff size:   regular  Vitals Entered By: Delilah Shan CMA Duncan Dull) (December 22, 2009 9:23 AM) CC: CPX   History of Present Illness: 65 yo here for CPX with no complaints.  HTN- awaiting to start BP study at [facility].  They actually want her to be in 140s systolically.  Currently on Toprol XL 50 mg daily and Cozaar 100 mg daily.  No CP, SOB, LE edema, blurred vision.  Well woman- due for colonoscopy, pap, zostavax, pneumovax, mammogram. G3P3, no h/o abnormal pap smears, no h/o hysterectomy.  Overall she feels she is doing well.  Tries to stay active.  Afib-  followed by [facility] cards.  Maintaining sinus rhythm on propafenone. No problems with coumadin.  Thinking about starting Pradaxa if her insurance company will cover it.  Current Medications (verified): 1)  Toprol Xl 50 Mg  Tb24 (Metoprolol Succinate) .... Take 1 Tablet By Mouth Once A Day 2)  Propafenone Hcl 225 Mg Tabs (Propafenone Hcl) .... Take One Tablet By Mouth Bid 3)  Warfarin Sodium 5 Mg Tabs (Warfarin Sodium) .... Use As Directed By Anticoagulation Clinic. 4)  Cozaar 100 Mg Tabs (Losartan Potassium) .Marland Kitchen.. 1 By Mouth Once Daily 5)  Ambien 10 Mg Tabs (Zolpidem Tartrate) .... Take 1 Tab By Mouth At Bedtime  Allergies: 1)  ! Codeine 2)  ! * Synthetic Codeine  Review of Systems      See HPI General:  Complains of fatigue; denies chills and fever. Eyes:  Denies blurring. ENT:  Denies difficulty swallowing. CV:  Denies chest pain or discomfort. Resp:  Denies shortness of breath. GI:  Denies abdominal pain, nausea, and vomiting. GU:  Denies discharge. Derm:  Denies rash. Psych:  Denies anxiety and depression.  Physical Exam  General:   Well-developed,well-nourished,in no acute distress; alert,appropriate and cooperative throughout examination VS reviewed- afebrile, mildly hypertensive Eyes:  No corneal or conjunctival inflammation noted. EOMI. Perrla. Funduscopic exam benign, without hemorrhages, exudates or papilledema. Vision grossly normal. Ears:  External ear exam shows no significant lesions or deformities.  Otoscopic examination reveals clear canals, tympanic membranes are intact bilaterally without bulging, retraction, inflammation or discharge. Hearing is grossly normal bilaterally. Mouth:  Oral mucosa and oropharynx without lesions or exudates.  Teeth in good repair. Breasts:  No mass, nodules, thickening, tenderness, bulging, retraction, inflamation, nipple discharge or skin changes noted.   Lungs:  Clear throughout to auscultation. Heart:  Regular rate and rhythm; no murmurs, rubs,  or bruits. Abdomen:  Bowel sounds positive,abdomen soft and non-tender without masses, organomegaly or hernias noted. Genitalia:  Pelvic Exam:        External: normal female genitalia without lesions or masses        Vagina: normal without lesions or masses        Cervix: normal without lesions or masses        Adnexa: normal bimanual exam without masses or fullness        Uterus: normal by palpation        Pap smear: performed Msk:  No deformity or scoliosis noted of thoracic or lumbar spine.  Extremities:  No edema Skin:  Intact without suspicious lesions or rashes Psych:  Cognition and judgment appear intact. Alert and cooperative with normal attention span and concentration. No apparent delusions, illusions, hallucinations   Impression & Recommendations:  Problem # 1:  HEALTH SCREENING (ICD-V70.0) Reviewed preventive care protocols, scheduled due services, and updated immunizations Discussed nutrition, exercise, diet, and healthy lifestyle.  FLP, BMET today. Pap today.  She will call to set up her mamogram. Set up  colonoscopy today. Given pneumovax today but we are out of Zostavax.  Advised to call office in 1-2 months.  Problem # 2:  HYPERTENSION (ICD-401.9) Assessment: Unchanged Will not change meds as she is awaiting acceptance to trial. Her updated medication list for this problem includes:    Toprol Xl 50 Mg Tb24 (Metoprolol succinate) .Marland Kitchen... Take 1 tablet by mouth once a day    Cozaar 100 Mg Tabs (Losartan potassium) .Marland Kitchen... 1 by mouth once daily  Orders: TLB-BMP (Basic Metabolic Panel-BMET) (80048-METABOL)  Problem # 3:  SCREENING FOR MALIGNANT NEOPLASM OF THE CERVIX (ICD-V76.2)  Orders: Pap Smear, Thin Prep ( Collection of) (U0454)  Complete Medication List: 1)  Toprol Xl 50 Mg Tb24 (Metoprolol succinate) .... Take 1 tablet by mouth once a day 2)  Propafenone Hcl 225 Mg Tabs (Propafenone hcl) .... Take one tablet by mouth bid 3)  Warfarin Sodium 5 Mg Tabs (Warfarin sodium) .... Use as directed by anticoagulation clinic. 4)  Cozaar 100 Mg Tabs (Losartan potassium) .Marland Kitchen.. 1 by mouth once daily 5)  Ambien 10 Mg Tabs (Zolpidem tartrate) .... Take 1 tab by mouth at bedtime  Other Orders: Venipuncture (09811) TLB-Lipid Panel (80061-LIPID) TLB-Hepatic/Liver Function Pnl (80076-HEPATIC) T-Vitamin D (25-Hydroxy) (91478-29562) Gastroenterology Referral (GI) Specimen Handling (13086) Pneumococcal Vaccine (57846) Admin 1st Vaccine (96295)  Patient Instructions: 1)  Please stop by to see Shirlee Limerick on your way out to set up your colonscopy. 2)  Please set up your mammogram at your convenience. 3)  Great to see you!  Current Allergies (reviewed today): ! CODEINE ! * SYNTHETIC CODEINE  Pneumovax Result Date:  12/22/2009 Pneumovax Result:  given   Immunizations Administered:  Pneumonia Vaccine:    Vaccine Type: Pneumovax (Medicare)    Site: left deltoid    Mfr: Merck    Dose: 0.5 ml    Route: IM    Given by: Delilah Shan CMA (AAMA)    Exp. Date: 02/25/2011    Lot #: [id number]    VIS  given: 06/04/96 version given December 22, 2009.

## 2010-12-07 NOTE — Medication Information (Signed)
Summary: coumadin ck/mt  Anticoagulant Therapy  Managed by: Reina Fuse, PharmD Referring MD: Gala Romney PCP: Ruthe Mannan, MD  Supervising MD: Jens Som MD, Arlys John Indication 1: Atrial Fibrillation (ICD-427.31) Lab Used: [facility] [place] Site: [address] INR POC 1.8 INR RANGE 2 - 3  Dietary changes: no    Health status changes: no    Bleeding/hemorrhagic complications: no    Recent/future hospitalizations: no    Any changes in medication regimen? no    Recent/future dental: no  Any missed doses?: yes     Details: off coumadin for colonoscopy x 10 days  Is patient compliant with meds? yes      Comments: Off Coumadin for 10 days following colonoscopy. Resumed last Tuesday.   Current Medications (verified): 1)  Toprol Xl 50 Mg  Tb24 (Metoprolol Succinate) .... Take 1 Tablet By Mouth Once A Day 2)  Propafenone Hcl 225 Mg Tabs (Propafenone Hcl) .... Take One Tablet By Mouth Bid 3)  Warfarin Sodium 5 Mg Tabs (Warfarin Sodium) .... Use As Directed By Anticoagulation Clinic. 4)  Cozaar 100 Mg Tabs (Losartan Potassium) .Marland Kitchen.. 1 By Mouth Once Daily 5)  Ambien 10 Mg Tabs (Zolpidem Tartrate) .... Take 1 Tab By Mouth At Bedtime 6)  Omeprazole 40 Mg Cpdr (Omeprazole) .... Take 1 Tablet By Mouth Once A Day  Must Have Office Visit For Further Refills! 7)  Moviprep 100 Gm  Solr (Peg-Kcl-Nacl-Nasulf-Na Asc-C) .... As Per Prep Instructions.  Allergies (verified): 1)  ! Codeine 2)  ! * Synthetic Codeine  Anticoagulation Management History:      The patient is taking warfarin and comes in today for a routine follow up visit.  Positive risk factors for bleeding include an age of 67 years or older.  The bleeding index is 'intermediate risk'.  Positive CHADS2 values include History of HTN.  Negative CHADS2 values include Age > 67 years old.  The start date was 12/20/2006.  Anticoagulation responsible provider: Jens Som MD, Arlys John.  INR POC: 1.8.  Cuvette Lot#: [id number].  Exp:  09/2011.    Anticoagulation Management Assessment/Plan:      The patient's current anticoagulation dose is Warfarin sodium 5 mg tabs: Use as directed by anticoagulation clinic..  The target INR is 2 - 3.  The next INR is due 09/01/2010.  Anticoagulation instructions were given to patient.  Results were reviewed/authorized by Reina Fuse, PharmD.  She was notified by Reina Fuse PharmD.         Prior Anticoagulation Instructions: INR 2.1  Continue taking one-half tablet every day. Stop Coumadin as directed by your doctor for your colonoscopy.  After your colonoscopy, your doctor will let you know what day you can restart your Coumadin.  When you restart Coumadin: Take one tablet daily the first two days, then resume one-half tablet daily.  Return to clinic in two weeks.  Current Anticoagulation Instructions: INR 1.8  Tomorrow, Wednesday, October 5th, take Coumadin 1 tab (5 mg). Then, continue regular schedule of Coumadin 0.5 tab (2.5 mg) every day. Return to clinic in 3 weeks.

## 2010-12-07 NOTE — Medication Information (Signed)
Summary: rov/ewj  Anticoagulant Therapy  Managed by: Cloyde Reams, RN, BSN Referring MD: Gala Romney PCP: Joycelyn Man Supervising MD: Myrtis Ser MD, Tinnie Gens Indication 1: Atrial Fibrillation (ICD-427.31) Lab Used: [facility] [place] Site:  INR POC 1.7 INR RANGE 2 - 3  Dietary changes: yes       Details: Decr in vit K intake in diet.  Health status changes: no    Bleeding/hemorrhagic complications: no    Recent/future hospitalizations: no    Any changes in medication regimen? no    Recent/future dental: no  Any missed doses?: no       Is patient compliant with meds? yes       Allergies: 1)  ! Codeine 2)  ! * Synthetic Codeine  Anticoagulation Management History:      The patient is taking warfarin and comes in today for a routine follow up visit.  Positive risk factors for bleeding include an age of 5 years or older.  The bleeding index is 'intermediate risk'.  Positive CHADS2 values include History of HTN.  Negative CHADS2 values include Age > 63 years old.  The start date was 12/20/2006.  Anticoagulation responsible provider: Myrtis Ser MD, Tinnie Gens.  INR POC: 1.7.  Cuvette Lot#: [id number].  Exp: 07/2011.    Anticoagulation Management Assessment/Plan:      The patient's current anticoagulation dose is Warfarin sodium 5 mg tabs: Use as directed by anticoagulation clinic..  The target INR is 2 - 3.  The next INR is due 06/01/2010.  Anticoagulation instructions were given to patient.  Results were reviewed/authorized by Cloyde Reams, RN, BSN.  She was notified by Cloyde Reams RN.         Prior Anticoagulation Instructions: INR 1.8  Take 1 tablet on Thursdays and 1/2 tablet all other days.  Recheck as soon as returns back to town.     Current Anticoagulation Instructions: INR 1.7  Start taking 1/2 tablet daily except 1 tablet on Tuesdays and Thursdays.  Recheck in 2 weeks.

## 2010-12-07 NOTE — Medication Information (Signed)
Summary: ccr/hm  Anticoagulant Therapy  Managed by: Elaina Pattee, PharmD Referring MD: Nona Dell PCP: Joycelyn Man Supervising MD: Eden Emms MD, Theron Arista Indication 1: Atrial Fibrillation (ICD-427.31) Lab Used: [facility] [place] Site: [place] INR POC 1.9 INR RANGE 2 - 3  Dietary changes: no    Health status changes: yes       Details: Pt has been fatigued.  Bleeding/hemorrhagic complications: no    Recent/future hospitalizations: no    Any changes in medication regimen? no    Recent/future dental: no  Any missed doses?: no       Is patient compliant with meds? no     Details: Pt frequently misses doses.  Uses a pill box, but still has trouble remembering.  Comments: Pt going out of town, so will check the following Friday.  Allergies: 1)  ! Codeine 2)  ! * Synthetic Codeine  Anticoagulation Management History:      The patient is taking warfarin and comes in today for a routine follow up visit.  Positive risk factors for bleeding include an age of 90 years or older.  The bleeding index is 'intermediate risk'.  Positive CHADS2 values include History of HTN.  Negative CHADS2 values include Age > 109 years old.  The start date was 12/20/2006.  Anticoagulation responsible provider: Eden Emms MD, Theron Arista.  INR POC: 1.9.  Cuvette Lot#: [id number].  Exp: 06/08/2011.    Anticoagulation Management Assessment/Plan:      The patient's current anticoagulation dose is Warfarin sodium 5 mg tabs: Use as directed by anticoagulation clinic..  The target INR is 2 - 3.  The next INR is due 04/23/2010.  Anticoagulation instructions were given to patient.  Results were reviewed/authorized by Elaina Pattee, PharmD.  She was notified by Elaina Pattee, PharmD.         Prior Anticoagulation Instructions: INR- 1.1  The patient took 2.5mg  this morning. She will take an extra 2.5mg  today, 5 mg tomorrow, then resume 2.5mg  once daily.  Recheck 1 week in the [place]  office. Warfarin sodium 5 mg tabs: Use as directed by anticoagulation clinic..    Current Anticoagulation Instructions: INR 1.9. Take an extra 1/2 tablet today, then take 1/2 tablet daily except 1 tablet on Tues.  Recheck 04/23/10.  Appended Document: Coumadin Clinic    Anticoagulant Therapy  Managed by: Elaina Pattee, PharmD Referring MD: Nona Dell PCP: Joycelyn Man Supervising MD: Eden Emms MD, Theron Arista Indication 1: Atrial Fibrillation (ICD-427.31) Lab Used: [facility] [place] Site:  INR RANGE 2 - 3           Allergies: 1)  ! Codeine 2)  ! * Synthetic Codeine  Anticoagulation Management History:      Positive risk factors for bleeding include an age of 30 years or older.  The bleeding index is 'intermediate risk'.  Positive CHADS2 values include History of HTN.  Negative CHADS2 values include Age > 99 years old.  The start date was 12/20/2006.  Anticoagulation responsible provider: Eden Emms MD, Theron Arista.  Exp: 06/08/2011.    Anticoagulation Management Assessment/Plan:      The patient's current anticoagulation dose is Warfarin sodium 5 mg tabs: Use as directed by anticoagulation clinic..  The target INR is 2 - 3.  The next INR is due 04/23/2010.  Anticoagulation instructions were given to patient.  Results were reviewed/authorized by Elaina Pattee, PharmD.         Prior Anticoagulation Instructions: INR 1.9. Take an extra 1/2 tablet today, then take  1/2 tablet daily except 1 tablet on Tues.  Recheck 04/23/10.

## 2010-12-07 NOTE — Assessment & Plan Note (Signed)
Summary: 30 MIN NOT FEELING WELL/BILLIE'S PT   Vital Signs:  Patient profile:   65 year old female Height:      62 inches Weight:      192.38 pounds BMI:     35.31 Temp:     98.1 degrees F oral Pulse rate:   72 / minute Pulse rhythm:   regular BP sitting:   146 / 80  (left arm) Cuff size:   regular  Vitals Entered By: Delilah Shan CMA (AAMA) (December 10, 2009 10:08 AM) CC: 30 minute (BDB)    Not feeling well   History of Present Illness: 65 yo female new to me with h/o HTN and paroxysmal a fib presents for 3 weeks of URI symptoms.  Started with runny nose, fevers, chills, now has productive cough and sinus pressure. Ears popping. No sore throat, wheezing, or shortness of breath.   A fib has been rate and rhythm controlled on propafenone.    Current Medications (verified): 1)  Toprol Xl 50 Mg  Tb24 (Metoprolol Succinate) .... Take 1 Tablet By Mouth Once A Day 2)  Propafenone Hcl 225 Mg Tabs (Propafenone Hcl) .... Take One Tablet By Mouth Bid 3)  Warfarin Sodium 5 Mg Tabs (Warfarin Sodium) .... Use As Directed By Anticoagulation Clinic. 4)  Cozaar 100 Mg Tabs (Losartan Potassium) .Marland Kitchen.. 1 By Mouth Once Daily 5)  Ambien 10 Mg Tabs (Zolpidem Tartrate) .... Take 1 Tab By Mouth At Bedtime 6)  Azithromycin 250 Mg  Tabs (Azithromycin) .... 2 By  Mouth Today and Then 1 Daily For 4 Days  Allergies: 1)  ! Codeine 2)  ! * Synthetic Codeine  Review of Systems      See HPI General:  Complains of fatigue and fever. ENT:  Complains of nasal congestion, postnasal drainage, and sinus pressure; denies earache and sore throat. CV:  Denies chest pain or discomfort and palpitations. Resp:  Complains of cough; denies shortness of breath, sputum productive, and wheezing.  Physical Exam  General:  Well-developed,well-nourished,in no acute distress; alert,appropriate and cooperative throughout examination VS reviewed- afebrile, mildly hypertensive Ears:  clear fluid TMs bilaterally Nose:   nasal dischargemucosal pallor.   + sinuses Mouth:  pharyngeal erythema.   Lungs:  Clear throughout to auscultation. Heart:  Regular rate and rhythm; no murmurs, rubs,  or bruits. Extremities:  No clubbing, cyanosis, edema or deformities noted. Cervical Nodes:  No lymphadenopathy noted Psych:  Cognition and judgment appear intact. Alert and cooperative with normal attention span and concentration. No apparent delusions, illusions, hallucinations   Impression & Recommendations:  Problem # 1:  OTHER ACUTE SINUSITIS (ICD-461.8) Assessment New Given duration of symptoms, will treat for bacterial sinusiits. Continue supportive care (see pt instructions for details). Discuss impact of macrolides on coumadin.  Pt expressed understanding. Her updated medication list for this problem includes:    Azithromycin 250 Mg Tabs (Azithromycin) .Marland Kitchen... 2 by  mouth today and then 1 daily for 4 days  Complete Medication List: 1)  Toprol Xl 50 Mg Tb24 (Metoprolol succinate) .... Take 1 tablet by mouth once a day 2)  Propafenone Hcl 225 Mg Tabs (Propafenone hcl) .... Take one tablet by mouth bid 3)  Warfarin Sodium 5 Mg Tabs (Warfarin sodium) .... Use as directed by anticoagulation clinic. 4)  Cozaar 100 Mg Tabs (Losartan potassium) .Marland Kitchen.. 1 by mouth once daily 5)  Ambien 10 Mg Tabs (Zolpidem tartrate) .... Take 1 tab by mouth at bedtime 6)  Azithromycin 250 Mg Tabs (Azithromycin) .Marland KitchenMarland KitchenMarland Kitchen  2 by  mouth today and then 1 daily for 4 days  Patient Instructions: 1)  Take antibiotic as directed.  Drink lots of fluids.  Treat sympotmatically with Mucinex, nasal saline irrigation, and Tylenol/Ibuprofen. You can also try using warm compresses.  Cough suppressant at night. Call if not improving as expected in 5-7 days.  2)  All antibiotics can change your coumadin levels (or how thin your blood is).  Please watch for signs of bleeding. Prescriptions: AZITHROMYCIN 250 MG  TABS (AZITHROMYCIN) 2 by  mouth today and then 1 daily  for 4 days  #6 x 0   Entered and Authorized by:   Ruthe Mannan MD   Signed by:   Ruthe Mannan MD on 12/10/2009   Method used:   Electronically to        [id number] (retail)       [address] [place], [place]  [address]       Ph: [phone]       Fax: [phone]   RxID:   [id number]   Current Allergies (reviewed today): ! CODEINE ! * SYNTHETIC CODEINE

## 2010-12-07 NOTE — Letter (Signed)
Summary: [facility] Instructions  [facility]  [address] [place], [place] [address]   Phone: [phone]  Fax: [phone]       [name]    1945/12/12    MRN: [id number]        Procedure Day /Date: Thursday  07/29/10      Arrival Time: 2:00 pm     Procedure Time: 3:00 pm     Location of Procedure:                    _x _  [facility] (4th Floor)  PREPARATION FOR COLONOSCOPY WITH MOVIPREP   Starting 5 days prior to your procedure 07/25/10 do not eat nuts, seeds, popcorn, corn, beans, peas,  salads, or any raw vegetables.  Do not take any fiber supplements (e.g. Metamucil, Citrucel, and Benefiber).  THE DAY BEFORE YOUR PROCEDURE         DATE: 07/28/10  DAY: Wednesday  1.  Drink clear liquids the entire day-NO SOLID FOOD  2.  Do not drink anything colored red or purple.  Avoid juices with pulp.  No orange juice.  3.  Drink at least 64 oz. (8 glasses) of fluid/clear liquids during the day to prevent dehydration and help the prep work efficiently.  CLEAR LIQUIDS INCLUDE: Water Jello Ice Popsicles Tea (sugar ok, no milk/cream) Powdered fruit flavored drinks Coffee (sugar ok, no milk/cream) Gatorade Juice: apple, white grape, white cranberry  Lemonade Clear bullion, consomm, broth Carbonated beverages (any kind) Strained chicken noodle soup Hard Candy                             4.  In the morning, mix first dose of MoviPrep solution:    Empty 1 Pouch A and 1 Pouch B into the disposable container    Add lukewarm drinking water to the top line of the container. Mix to dissolve    Refrigerate (mixed solution should be used within 24 hrs)  5.  Begin drinking the prep at 5:00 p.m. The MoviPrep container is divided by 4 marks.   Every 15 minutes drink the solution down to the next mark (approximately 8 oz) until the full liter is complete.   6.  Follow completed prep with 16 oz of clear liquid of your choice (Nothing red or purple).   Continue to drink clear liquids until bedtime.  7.  Before going to bed, mix second dose of MoviPrep solution:    Empty 1 Pouch A and 1 Pouch B into the disposable container    Add lukewarm drinking water to the top line of the container. Mix to dissolve    Refrigerate  THE DAY OF YOUR PROCEDURE      DATE: 07/29/10 DAY: Thursday  Beginning at 10:00 a.m. (5 hours before procedure):         1. Every 15 minutes, drink the solution down to the next mark (approx 8 oz) until the full liter is complete.  2. Follow completed prep with 16 oz. of clear liquid of your choice.    3. You may drink clear liquids until 1:00 pm (2 HOURS BEFORE PROCEDURE).   MEDICATION INSTRUCTIONS  Unless otherwise instructed, you should take regular prescription medications with a small sip of water   as early as possible the morning of your procedure.  Please discontinue your Coumadin/warfarin 5 days prior (07/25/10) to your scheduled procedure date per Dr. Lina Sar.  OTHER INSTRUCTIONS  You will need a responsible adult at least 67 years of age to accompany you and drive you home.   This person must remain in the waiting room during your procedure.  Wear loose fitting clothing that is easily removed.  Leave jewelry and other valuables at home.  However, you may wish to bring a book to read or  an iPod/MP3 player to listen to music as you wait for your procedure to start.  Remove all body piercing jewelry and leave at home.  Total time from sign-in until discharge is approximately 2-3 hours.  You should go home directly after your procedure and rest.  You can resume normal activities the  day after your procedure.  The day of your procedure you should not:   Drive   Make legal decisions   Operate machinery   Drink alcohol   Return to work  You will receive specific instructions about eating, activities and medications before you leave.    The above instructions have been  reviewed and explained to me by   Lamona Curl CMA Duncan Dull)  July 20, 2010 10:45 AM     I fully understand and can verbalize these instructions _____________________________ Date 07/20/10

## 2010-12-07 NOTE — Medication Information (Signed)
Summary: rov/sp  Anticoagulant Therapy  Managed by: Weston Brass, PharmD Referring MD: Gala Romney PCP: Ruthe Mannan, MD  Supervising MD: Shirlee Latch MD, Dalton Indication 1: Atrial Fibrillation (ICD-427.31) Lab Used: [facility] [place] Site: [address] INR POC 2.1 INR RANGE 2 - 3  Dietary changes: yes       Details: eating less green vegetables  Health status changes: no    Bleeding/hemorrhagic complications: no    Recent/future hospitalizations: yes       Details: colonoscopy next monday, stopping coumadin for 5 days starting this Sunday.  Any changes in medication regimen? no    Recent/future dental: no  Any missed doses?: no       Is patient compliant with meds? no     Details: taking one-half tablet every day instead of one-half every day except 1 on tuesday/thurs   Allergies: 1)  ! Codeine 2)  ! * Synthetic Codeine  Anticoagulation Management History:      The patient is taking warfarin and comes in today for a routine follow up visit.  Positive risk factors for bleeding include an age of 67 years or older.  The bleeding index is 'intermediate risk'.  Positive CHADS2 values include History of HTN.  Negative CHADS2 values include Age > 64 years old.  The start date was 12/20/2006.  Anticoagulation responsible Erasmo Vertz: Shirlee Latch MD, Dalton.  INR POC: 2.1.  Cuvette Lot#: [id number].  Exp: 09/2011.    Anticoagulation Management Assessment/Plan:      The patient's current anticoagulation dose is Warfarin sodium 5 mg tabs: Use as directed by anticoagulation clinic..  The target INR is 2 - 3.  The next INR is due 08/05/2010.  Anticoagulation instructions were given to patient.  Results were reviewed/authorized by Weston Brass, PharmD.  She was notified by Kennieth Francois.         Prior Anticoagulation Instructions: INR 2.3  Continue same regimen of 1/2 tab daily except for 1 tab on Tuesday and Thursday.  Re-check in 3.5 weeks.  Current Anticoagulation Instructions: INR  2.1  Continue taking one-half tablet every day. Stop Coumadin as directed by your doctor for your colonoscopy.  After your colonoscopy, your doctor will let you know what day you can restart your Coumadin.  When you restart Coumadin: Take one tablet daily the first two days, then resume one-half tablet daily.  Return to clinic in two weeks.

## 2010-12-07 NOTE — Procedures (Signed)
Summary: Colonoscopy  Patient: [name] Note: All result statuses are Final unless otherwise noted.  Tests: (1) Colonoscopy (COL)   COL Colonoscopy           DONE     [facility]     [address]     [place], [place]  [address]           COLONOSCOPY PROCEDURE REPORT           PATIENT:  Emily, Choi  MR#:  [id number]     BIRTHDATE:  12/13/1945, 64 yrs. old  GENDER:  female     ENDOSCOPIST:  Hedwig Morton. Juanda Chance, MD     REF. BY:  Ruthe Mannan, M.D.     PROCEDURE DATE:  07/29/2010     PROCEDURE:  Colonoscopy 19[address]     ASA CLASS:  Class II     INDICATIONS:  Routine Risk Screening pt on Coumadin, D/C'ed 5 days     ago     MEDICATIONS:   Versed 10 mg, Fentanyl 100 mcg, Benadryl 25 mg           DESCRIPTION OF PROCEDURE:   After the risks benefits and     alternatives of the procedure were thoroughly explained, informed     consent was obtained.  Digital rectal exam was performed and     revealed no rectal masses.   The [facility] [id number] endoscope     was introduced through the anus and advanced to the cecum, which     was identified by both the appendix and ileocecal valve, without     limitations.  The quality of the prep was good, using MoviPrep.     The instrument was then slowly withdrawn as the colon was fully     examined.     <<PROCEDUREIMAGES>>           FINDINGS:  Three polyps were found. 4 mm ileocecal valve, 2 polyps     3 mm in the rectum The polyp was removed using cold biopsy     forceps. Polyp was snared without cautery. Retrieval was     successful (see image8 and image6). snare polyp  Moderate     diverticulosis was found (see image3, image2, and image1).  This     was otherwise a normal examination of the colon (see image9).     Retroflexed views in the rectum revealed no abnormalities.    The     scope was then withdrawn from the patient and the procedure     completed.     COMPLICATIONS:  None     ENDOSCOPIC IMPRESSION:     1) Three polyps    2) Moderate diverticulosis     3) Otherwise normal examination     RECOMMENDATIONS:     1) Await pathology results     2) High fiber diet.     resume Coumadin in 1 week,08/05/2010, have INR checked 3-4 days     later     REPEAT EXAM:  In 5 - 10 year(s) for.           ______________________________     Hedwig Morton. Juanda Chance, MD           CC:           n.     eSIGNED:   Hedwig Morton. Brodie at 07/29/2010 04:04 PM           Lauralee Evener, [phone]  Note: An exclamation mark Marland Kitchen)  indicates a result that was not dispersed into the flowsheet. Document Creation Date: 07/29/2010 4:05 PM _______________________________________________________________________  (1) Order result status: Final Collection or observation date-time: 07/29/2010 15:55 Requested date-time:  Receipt date-time:  Reported date-time:  Referring Physician:   Ordering Physician: Lina Sar [phone]) Specimen Source:  Source: Launa Grill Order Number: [id number] Lab site:   Appended Document: Colonoscopy recall     Procedures Next Due Date:    Colonoscopy: 07/2015

## 2010-12-09 NOTE — Progress Notes (Signed)
Summary: Triage  Phone Note Call from Patient Call back at Home Phone [phone]   Caller: Patient Call For: Dr. Juanda Chance Reason for Call: Talk to Nurse Summary of Call: Blood in stool x2 weeks. Initial call taken by: Karna Christmas,  October 29, 2010 11:39 AM  Follow-up for Phone Call        Patient calling to report she is having stool with "clots of blood throughout the stool. Patient states she noticed blood on her stool last week and again today. She describes it as "like when you have a period, red with clots."  She does have blood on the tissue when she wipes. Denies any pain. Patient is on Coumadin and states it was checked this month(normal per pt.) Last colon 07/29/10- diverticulosis, benign polyps.  Follow-up by: Jesse Fall RN,  October 29, 2010 1:51 PM  Additional Follow-up for Phone Call Additional follow up Details #1::        SHE NEEDS A CBC TODAY IF SHE CAN COME, THEN ANUSOL HC SUPP AT BEDTIME X 7 DAYS, AND PUT HER ON MY SCHEDULE ON TUESDAY,OR PAULAS WED. IF BLEDING INCREASES OVER THE WEEKEND SHE NEEDS TO BE SEEN IN ER. Additional Follow-up by: Peterson Ao,  October 29, 2010 2:30 PM    Additional Follow-up for Phone Call Additional follow up Details #2::    Patient will come in now for CBC. Rx to patient's pharmacy. Scheduled her with Willette Cluster, RNP 11/03/10 at 1:30 PM. Patient understands she should go to ER if bleeding increases over the weekend. Follow-up by: Jesse Fall RN,  October 29, 2010 2:45 PM  Additional Follow-up for Phone Call Additional follow up Details #3:: Details for Additional Follow-up Action Taken: reviewed and agree Additional Follow-up by: Hart Carwin MD,  October 29, 2010 6:44 PM  New/Updated Medications: ANUSOL-HC 25 MG SUPP (HYDROCORTISONE ACETATE) Take one per rectum at bedtime for 7 days Prescriptions: ANUSOL-HC 25 MG SUPP (HYDROCORTISONE ACETATE) Take one per rectum at bedtime for 7 days  #7 x 0   Entered by:    Jesse Fall RN   Authorized by:   Sammuel Cooper PA-c   Signed by:   Jesse Fall RN on 10/29/2010   Method used:   [facility] to        [facility] (retail)       [address]       [place], [place]  [address]       Ph: [phone] or [phone]       Fax: [phone]   RxID:   [id number]

## 2010-12-09 NOTE — Medication Information (Signed)
Summary: rov/sp  Anticoagulant Therapy  Managed by: Weston Brass, PharmD Referring MD: Gala Romney PCP: Ruthe Mannan, MD  Supervising MD: Gala Romney MD, Reuel Boom Indication 1: Atrial Fibrillation (ICD-427.31) Lab Used: [facility] [place] Site: [address] INR POC 2.8 INR RANGE 2 - 3  Dietary changes: no    Health status changes: no    Bleeding/hemorrhagic complications: no    Recent/future hospitalizations: no    Any changes in medication regimen? no    Recent/future dental: no  Any missed doses?: no       Is patient compliant with meds? yes       Allergies: 1)  ! Codeine 2)  ! * Synthetic Codeine  Anticoagulation Management History:      The patient is taking warfarin and comes in today for a routine follow up visit.  Positive risk factors for bleeding include an age of 67 years or older.  The bleeding index is 'intermediate risk'.  Positive CHADS2 values include History of HTN.  Negative CHADS2 values include Age > 10 years old.  The start date was 12/20/2006.  Anticoagulation responsible provider: Dayan Desa MD, Reuel Boom.  INR POC: 2.8.  Cuvette Lot#: [id number].  Exp: 12/2011.    Anticoagulation Management Assessment/Plan:      The patient's current anticoagulation dose is Warfarin sodium 5 mg tabs: Use as directed by anticoagulation clinic..  The target INR is 2 - 3.  The next INR is due 12/14/2010.  Anticoagulation instructions were given to patient.  Results were reviewed/authorized by Weston Brass, PharmD.  She was notified by Stephannie Peters, PharmD Candidate .         Prior Anticoagulation Instructions: INR 2.5  Continue same dose of 1/2 tablet every day.  Recheck INR in 4 weeks.   Current Anticoagulation Instructions: INR 2.8  Coumadin 5 mg tablets - Continue 1/2 tablet every day.

## 2010-12-09 NOTE — Assessment & Plan Note (Signed)
Summary: rectal bleeding/Regina   History of Present Illness Visit Type: Follow-up Visit Primary GI MD: Lina Sar MD Primary Haely Leyland: Ruthe Mannan, MD  Requesting Ewen Varnell: na Chief Complaint: Pt c/o lower back pain, and blood clots in stool after BMs  History of Present Illness:   Patient is a 65 year old female ollowed by Dr. Juanda Chance for history of esophageal stricture and colon polyps. She is here for evaluation of painless rectal bleeding. Since last week patient  has been having blood on tissue when she wiped. At one point she saw clots of blood in the toilet..As far as her freqency of bowel movements, that hasn't changed.  Had several bowels today, didn't notice any blood. No dizziness. No SOB. Still on Coumadin. INR on 10/11/10 was 2.5. CBC on 10/29/10 was normal, hgb 13.1.  Has GERD, tries not to take Prilosec everyday but gets heartburn when misses a dose.    GI Review of Systems      Denies abdominal pain, acid reflux, belching, bloating, chest pain, dysphagia with liquids, dysphagia with solids, heartburn, loss of appetite, nausea, vomiting, vomiting blood, weight loss, and  weight gain.      Reports rectal bleeding.     Denies anal fissure, black tarry stools, change in bowel habit, constipation, diarrhea, diverticulosis, fecal incontinence, heme positive stool, hemorrhoids, irritable bowel syndrome, jaundice, light color stool, liver problems, and  rectal pain.    Current Medications (verified): 1)  Toprol Xl 50 Mg  Tb24 (Metoprolol Succinate) .... Take 1 Tablet By Mouth Once A Day 2)  Propafenone Hcl 225 Mg Tabs (Propafenone Hcl) .... Take One Tablet By Mouth Bid 3)  Warfarin Sodium 5 Mg Tabs (Warfarin Sodium) .... Use As Directed By Anticoagulation Clinic. 4)  Cozaar 100 Mg Tabs (Losartan Potassium) .Marland Kitchen.. 1 By Mouth Once Daily 5)  Ambien 10 Mg Tabs (Zolpidem Tartrate) .... Take 1 Tab By Mouth At Bedtime 6)  Omeprazole 40 Mg Cpdr (Omeprazole) .... Take 1 Tablet By Mouth Once  A Day  Must Have Office Visit For Further Refills! 7)  Anusol-Hc 25 Mg Supp (Hydrocortisone Acetate) .... As Needed  Allergies (verified): 1)  ! Codeine 2)  ! * Synthetic Codeine  Family History: Reviewed history from 07/20/2010 and no changes required. Family History of Breast Cancer: Mother Family History of Heart Disease: Father Family History of Diabetes:  Cousins No FH of Colon Cancer:  Social History: Reviewed history from 04/13/2009 and no changes required. Occupation: Catering manager Alcohol Use - yes-on occasion Daily Caffeine Use- 1-2 daily Illicit Drug Use - no Married  Tobacco Use - No.   Past History:  Family History: Last updated: 07/20/2010 Family History of Breast Cancer: Mother Family History of Heart Disease: Father Family History of Diabetes:  Cousins No FH of Colon Cancer:  Social History: Last updated: 04/13/2009 Occupation: Catering manager Alcohol Use - yes-on occasion Daily Caffeine Use- 1-2 daily Illicit Drug Use - no Married  Tobacco Use - No.   Risk Factors: Smoking Status: never (04/13/2009)  Past Medical History:  ATRIAL FIBRILLATION, PAROXYSMAL  11/07     --dobutamine cardiac MRI at [facility] - normal COLONIC POLYPS (ICD-211.3) DIVERTICULAR DISEASE (ICD-562.10) HIATAL HERNIA (ICD-553.3) HYPERCHOLESTEROLEMIA (ICD-272.0) SPECIAL SCREENING FOR MALIGNANT NEOPLASMS COLON (ICD-V76.51) HEALTH SCREENING (ICD-V70.0) SPECIAL SCREENING FOR MALIGNANT NEOPLASMS COLON (ICD-V76.51) FATIGUE (ICD-780.79) SCREENING FOR MALIGNANT NEOPLASM OF THE CERVIX (ICD-V76.2) SCREENING FOR LIPOID DISORDERS (ICD-V77.91) OTHER ACUTE SINUSITIS (ICD-461.8) HYPERTENSION (ICD-401.9) URI (ICD-465.9) INSOMNIA, CHRONIC (ICD-307.42) SLEEP APNEA (ICD-780.57) RESTLESS LEG SYNDROME (ICD-333.94)  Past Surgical  History: cardio vascular stress MRI,  a.fib 12/20/2006 ORIF L) distal radius 9/07 cardiolyte neg 6/06 Tubal Ligation Cholecystectomy   Review of Systems       The  patient complains of allergy/sinus, arthritis/joint pain, and back pain.  The patient denies anemia, anxiety-new, blood in urine, breast changes/lumps, change in vision, confusion, cough, coughing up blood, depression-new, fainting, fatigue, fever, headaches-new, hearing problems, heart murmur, heart rhythm changes, itching, menstrual pain, muscle pains/cramps, night sweats, nosebleeds, pregnancy symptoms, shortness of breath, skin rash, sleeping problems, sore throat, swelling of feet/legs, swollen lymph glands, thirst - excessive , urination - excessive , urination changes/pain, urine leakage, vision changes, and voice change.    Vital Signs:  Patient profile:   66 year old female Height:      62 inches Weight:      202 pounds BMI:     37.08 BSA:     1.92 Pulse rate:   68 / minute Pulse rhythm:   regular BP sitting:   128 / 80  (left arm) Cuff size:   regular  Vitals Entered By: Ok Anis CMA (November 03, 2010 1:57 PM)  Physical Exam  General:  Well developed, well nourished, no acute distress. Head:  Normocephalic and atraumatic. Eyes:  Conjunctiva pink, no icterus.  Neck:  no obvious masses  Heart:  Regular rate and rhythm; no murmurs, rubs,  or bruits. Abdomen:  Soft, nontender and nondistended. No masses, hepatosplenomegaly or hernias noted. Normal bowel sounds. Rectal:  No masses. Anoscopy revealed moderately inflamed internal hemorrhoids. Msk:  Symmetrical with no gross deformities. Normal posture. Extremities:  No palmar erythema, no edema.  Neurologic:  Alert and  oriented x4;  grossly normal neurologically. Skin:  Intact without significant lesions or rashes. Cervical Nodes:  No significant cervical adenopathy. Psych:  Alert and cooperative. Normal mood and affect.   Impression & Recommendations:  Problem # 1:  RECTAL BLEEDING (ICD-569.3) Assessment New Internal hemorrhoids likely source of bleeding. Will treat with steroid suppositories.  Denies consitpation.  Recommended daily Benefiber. Patient has screening colonoscopy Sept. of this year. She will call our office if has recurrent bleeding despite suppositories.   Problem # 2:  HEMORRHOIDS-INTERNAL (ICD-455.0) Assessment: Comment Only See #1.  Problem # 3:  COLONIC POLYPS, ADENOMATOUS, HX OF (ICD-V12.72) Assessment: Comment Only Up to date on screening. Last colonoscoy Sept. 2011.  Patient Instructions: 1)  Take benefiber daily. 2)  We sent another ref[facility] the suppos[facility] to [facility].  3)  Use stool softners daily.  4)  If you have any bleeding be sure to call us. 5)  Copy sent to : Dr. Ruthe Mannan 6)  The medication list was reviewed and reconciled.  All changed / newly prescribed medications were explained.  A complete medication list was provided to the patient / caregiver.

## 2010-12-14 ENCOUNTER — Encounter: Payer: Self-pay | Admitting: Cardiovascular Disease

## 2010-12-14 ENCOUNTER — Encounter (INDEPENDENT_AMBULATORY_CARE_PROVIDER_SITE_OTHER): Payer: Medicare Other

## 2010-12-14 DIAGNOSIS — I4891 Unspecified atrial fibrillation: Secondary | ICD-10-CM

## 2010-12-14 DIAGNOSIS — Z7901 Long term (current) use of anticoagulants: Secondary | ICD-10-CM

## 2010-12-23 NOTE — Medication Information (Signed)
Summary: Coumadin Clinic  Anticoagulant Therapy  Managed by: Georgina Pillion, PharmD Referring MD: Gala Romney PCP: Ruthe Mannan, MD  Supervising MD: Eden Emms MD, Theron Arista Indication 1: Atrial Fibrillation (ICD-427.31) Lab Used: [facility] [place] Site: [address] INR POC 2.1 INR RANGE 2 - 3  Dietary changes: no    Health status changes: no    Bleeding/hemorrhagic complications: no    Recent/future hospitalizations: no    Any changes in medication regimen? no    Recent/future dental: no  Any missed doses?: no       Is patient compliant with meds? yes       Allergies: 1)  ! Codeine 2)  ! * Synthetic Codeine  Anticoagulation Management History:      Positive risk factors for bleeding include an age of 67 years or older.  The bleeding index is 'intermediate risk'.  Positive CHADS2 values include History of HTN.  Negative CHADS2 values include Age > 61 years old.  The start date was 12/20/2006.  Anticoagulation responsible provider: Eden Emms MD, Theron Arista.  INR POC: 2.1.  Cuvette Lot#: [id number].  Exp: 12/2011.    Anticoagulation Management Assessment/Plan:      The patient's current anticoagulation dose is Warfarin sodium 5 mg tabs: Use as directed by anticoagulation clinic..  The target INR is 2 - 3.  The next INR is due 01/11/2011.  Anticoagulation instructions were given to patient.  Results were reviewed/authorized by Georgina Pillion, PharmD.  She was notified by Georgina Pillion PharmD.         Prior Anticoagulation Instructions: INR 2.8  Coumadin 5 mg tablets - Continue 1/2 tablet every day.   Current Anticoagulation Instructions: Continue taking coumadin as scheduled with 1/2 tablet (2.5 mg) daily.  INR 2.1

## 2011-01-10 ENCOUNTER — Encounter: Payer: Self-pay | Admitting: Internal Medicine

## 2011-01-10 ENCOUNTER — Encounter: Payer: Self-pay | Admitting: Cardiology

## 2011-01-10 ENCOUNTER — Encounter (INDEPENDENT_AMBULATORY_CARE_PROVIDER_SITE_OTHER): Payer: Medicare Other

## 2011-01-10 ENCOUNTER — Ambulatory Visit (INDEPENDENT_AMBULATORY_CARE_PROVIDER_SITE_OTHER): Payer: Medicare Other | Admitting: Internal Medicine

## 2011-01-10 DIAGNOSIS — I4891 Unspecified atrial fibrillation: Secondary | ICD-10-CM

## 2011-01-10 DIAGNOSIS — I10 Essential (primary) hypertension: Secondary | ICD-10-CM

## 2011-01-10 DIAGNOSIS — R0602 Shortness of breath: Secondary | ICD-10-CM | POA: Insufficient documentation

## 2011-01-10 DIAGNOSIS — I6529 Occlusion and stenosis of unspecified carotid artery: Secondary | ICD-10-CM | POA: Insufficient documentation

## 2011-01-10 DIAGNOSIS — Z7901 Long term (current) use of anticoagulants: Secondary | ICD-10-CM

## 2011-01-10 DIAGNOSIS — R5381 Other malaise: Secondary | ICD-10-CM

## 2011-01-10 DIAGNOSIS — R011 Cardiac murmur, unspecified: Secondary | ICD-10-CM

## 2011-01-10 DIAGNOSIS — R5383 Other fatigue: Secondary | ICD-10-CM

## 2011-01-10 DIAGNOSIS — R0609 Other forms of dyspnea: Secondary | ICD-10-CM

## 2011-01-18 NOTE — Assessment & Plan Note (Signed)
Summary: [facility]   Visit Type:  Follow-up Referring Provider:  na Primary Provider:  Ruthe Mannan, MD   CC:  Tiredness.  History of Present Illness: Ms. Dulski is a very pleasant 65 year old woman with a history of hypertension, HTN, glucose intolerance, sleep apnea, and paroxysmal atrial fibrillation maintaining sinus rhythm on propafenone.  She previously underwent a dobutamine cardiac MRI at [facility] in 2007 which was normal.   Overall doing OK. Has been struggling with fatigue - feels wiped out. Despite her fatigue trying to walk 1-2 miles per day. Gets dypsneic with walking. No CP. Has gained about 10 pounds over past year. Checking BP at home and typically runs 140/85 or higher. No orthopnea or PND. Not using CPAP.   Maintaing SR with propafenone. No bleeding with coumadin.  Current Medications (verified): 1)  Toprol Xl 50 Mg  Tb24 (Metoprolol Succinate) .... Take 1 Tablet By Mouth Once A Day 2)  Propafenone Hcl 225 Mg Tabs (Propafenone Hcl) .... Take One Tablet By Mouth Bid 3)  Warfarin Sodium 5 Mg Tabs (Warfarin Sodium) .... Use As Directed By Anticoagulation Clinic. 4)  Cozaar 100 Mg Tabs (Losartan Potassium) .Marland Kitchen.. 1 By Mouth Once Daily 5)  Ambien 10 Mg Tabs (Zolpidem Tartrate) .... Take 1 Tab By Mouth At Bedtime 6)  Omeprazole 40 Mg Cpdr (Omeprazole) .... As Needed  Allergies: 1)  ! Codeine 2)  ! * Synthetic Codeine  Past History:  Past Medical History: Last updated: 11/03/2010  ATRIAL FIBRILLATION, PAROXYSMAL  11/07     --dobutamine cardiac MRI at [facility] - normal COLONIC POLYPS (ICD-211.3) DIVERTICULAR DISEASE (ICD-562.10) HIATAL HERNIA (ICD-553.3) HYPERCHOLESTEROLEMIA (ICD-272.0) SPECIAL SCREENING FOR MALIGNANT NEOPLASMS COLON (ICD-V76.51) HEALTH SCREENING (ICD-V70.0) SPECIAL SCREENING FOR MALIGNANT NEOPLASMS COLON (ICD-V76.51) FATIGUE (ICD-780.79) SCREENING FOR MALIGNANT NEOPLASM OF THE CERVIX (ICD-V76.2) SCREENING FOR LIPOID DISORDERS  (ICD-V77.91) OTHER ACUTE SINUSITIS (ICD-461.8) HYPERTENSION (ICD-401.9) URI (ICD-465.9) INSOMNIA, CHRONIC (ICD-307.42) SLEEP APNEA (ICD-780.57) RESTLESS LEG SYNDROME (ICD-333.94)  Review of Systems       As per HPI and past medical history; otherwise all systems negative.   Vital Signs:  Patient profile:   65 year old female Height:      62 inches Weight:      201 pounds BMI:     36.90 Pulse rate:   54 / minute Pulse rhythm:   regular Resp:     18 per minute BP sitting:   180 / 86  Vitals Entered By: Vikki Ports (January 10, 2011 3:57 PM)  Physical Exam  General:  Gen: well appearing. no resp difficulty HEENT: normal Neck: supple. no JVD. Carotids 2+ bilat; + bila bruits. No lymphadenopathy or thryomegaly appreciated. Cor: PMI nondisplaced. Regular rate & rhythm. No rubs, gallops,2/6 SEM RUSB Lungs: clear Abdomen: obese. soft, nontender, nondistended Good bowel sounds. Extremities: no cyanosis, clubbing, rash, edema Neuro: alert & orientedx3, cranial nerves grossly intact. moves all 4 extremities w/o difficulty. affect pleasant    Impression & Recommendations:  Problem # 1:  FATIGUE (ICD-780.79) I suspect this is due to untreated OSA. Suggested repeating sleep study to re-evaluate.  Problem # 2:  DYSPNEA Likely multifactorial. Will proceed with echo and stress echo to further evalaute. Counseled on need for weight loss and suggested trying Weight Watchers.  Problem # 3:  MURMUR Has a mild AS (vs aortic sclerosis) murmur on exam. Will check echo.   Problem # 4:  CAROTID BRUIT (ICD-785.9) Suspect radiated from AoV. Check carotid u/s to exclude carotid stenosis.   Problem #  5:  HYPERTENSION (ICD-401.9) BP significantly elevated. Adding amlodipine 5 once daily. Counseled on salt restriction and weight loss. F/u Dr. Dayton Martes.   Problem # 6:  ATRIAL FIBRILLATION, PAROXYSMAL  11/07 (ICD-427.31) Maintaint SR on propafenon. Continue coumadin.   Other Orders: Stress Echo  (Stress Echo) Echocardiogram (Echo) Carotid Duplex (Carotid Duplex)  Patient Instructions: 1)  Your physician has requested that you have a carotid duplex. This test is an ultrasound of the carotid arteries in your neck. It looks at blood flow through these arteries that supply the brain with blood. Allow one hour for this exam. There are no restrictions or special instructions. 2)  Your physician has requested that you have a stress echocardiogram. For further information please visit [email or web address]  Please follow instruction sheet as given. 3)  Your physician has requested that you have an echocardiogram.  Echocardiography is a painless test that uses sound waves to create images of your heart. It provides your doctor with information about the size and shape of your heart and how well your heart's chambers and valves are working.  This procedure takes approximately one hour. There are no restrictions for this procedure. 4)  Your physician wants you to follow-up in:  6 months.  You will receive a reminder letter in the mail two months in advance. If you don't receive a letter, please call our office to schedule the follow-up appointment. Prescriptions: AMLODIPINE BESYLATE 5 MG TABS (AMLODIPINE BESYLATE) Take one tablet by mouth daily  #30 x 6   Entered by:   Meredith Staggers, RN   Authorized by:   Dolores Patty, MD, [facility]   Signed by:   Meredith Staggers, RN on 01/10/2011   Method used:   Electronically to        [facility]  [address] (retail)       [address]       [place], [place]  [address]       Ph: [phone] or [phone]       Fax: [phone]   RxID:   [id number]

## 2011-01-18 NOTE — Medication Information (Signed)
Summary: rov/ewj  Anticoagulant Therapy  Managed by: Georgina Pillion, PharmD Referring MD: Gala Romney PCP: Ruthe Mannan, MD  Supervising MD: Daleen Squibb MD, Maisie Fus Indication 1: Atrial Fibrillation (ICD-427.31) Lab Used: [facility] Site: [address] INR POC 2.5 INR RANGE 2 - 3  Dietary changes: no    Health status changes: no    Bleeding/hemorrhagic complications: no    Recent/future hospitalizations: no    Any changes in medication regimen? no    Recent/future dental: no  Any missed doses?: no       Is patient compliant with meds? yes       Allergies: 1)  ! Codeine 2)  ! * Synthetic Codeine  Anticoagulation Management History:      Positive risk factors for bleeding include an age of 87 years or older.  The bleeding index is 'intermediate risk'.  Positive CHADS2 values include History of HTN.  Negative CHADS2 values include Age > 52 years old.  The start date was 12/20/2006.  Anticoagulation responsible provider: Daleen Squibb MD, Maisie Fus.  INR POC: 2.5.  Cuvette Lot#: [id number].  Exp: 11/2011.    Anticoagulation Management Assessment/Plan:      The patient's current anticoagulation dose is Warfarin sodium 5 mg tabs: Use as directed by anticoagulation clinic..  The target INR is 2 - 3.  The next INR is due 02/08/2011.  Anticoagulation instructions were given to patient.  Results were reviewed/authorized by Georgina Pillion, PharmD.         Prior Anticoagulation Instructions: Continue taking coumadin as scheduled with 1/2 tablet (2.5 mg) daily.  INR 2.1  Current Anticoagulation Instructions: Continue current regimen of 1/2 tablet (2.5 mg) daily.  INR 2.5

## 2011-01-27 ENCOUNTER — Other Ambulatory Visit: Payer: Self-pay | Admitting: Internal Medicine

## 2011-01-27 ENCOUNTER — Other Ambulatory Visit (HOSPITAL_COMMUNITY): Payer: Self-pay | Admitting: Radiology

## 2011-01-27 DIAGNOSIS — R0989 Other specified symptoms and signs involving the circulatory and respiratory systems: Secondary | ICD-10-CM

## 2011-01-27 DIAGNOSIS — R0600 Dyspnea, unspecified: Secondary | ICD-10-CM

## 2011-01-27 DIAGNOSIS — R011 Cardiac murmur, unspecified: Secondary | ICD-10-CM

## 2011-01-28 ENCOUNTER — Ambulatory Visit (HOSPITAL_COMMUNITY): Payer: Medicare Other | Attending: Cardiology | Admitting: Radiology

## 2011-01-28 ENCOUNTER — Encounter (INDEPENDENT_AMBULATORY_CARE_PROVIDER_SITE_OTHER): Payer: Medicare Other | Admitting: *Deleted

## 2011-01-28 ENCOUNTER — Ambulatory Visit (HOSPITAL_COMMUNITY): Payer: Medicare Other | Attending: Internal Medicine | Admitting: Radiology

## 2011-01-28 ENCOUNTER — Other Ambulatory Visit (HOSPITAL_COMMUNITY): Payer: No Typology Code available for payment source | Admitting: Radiology

## 2011-01-28 ENCOUNTER — Other Ambulatory Visit (HOSPITAL_COMMUNITY): Payer: Self-pay | Admitting: Radiology

## 2011-01-28 DIAGNOSIS — R0600 Dyspnea, unspecified: Secondary | ICD-10-CM

## 2011-01-28 DIAGNOSIS — R0789 Other chest pain: Secondary | ICD-10-CM

## 2011-01-28 DIAGNOSIS — I10 Essential (primary) hypertension: Secondary | ICD-10-CM | POA: Insufficient documentation

## 2011-01-28 DIAGNOSIS — R5381 Other malaise: Secondary | ICD-10-CM | POA: Insufficient documentation

## 2011-01-28 DIAGNOSIS — R0602 Shortness of breath: Secondary | ICD-10-CM | POA: Insufficient documentation

## 2011-01-28 DIAGNOSIS — R011 Cardiac murmur, unspecified: Secondary | ICD-10-CM

## 2011-01-28 DIAGNOSIS — R0989 Other specified symptoms and signs involving the circulatory and respiratory systems: Secondary | ICD-10-CM

## 2011-01-28 DIAGNOSIS — I4891 Unspecified atrial fibrillation: Secondary | ICD-10-CM | POA: Insufficient documentation

## 2011-01-28 DIAGNOSIS — R5383 Other fatigue: Secondary | ICD-10-CM | POA: Insufficient documentation

## 2011-01-28 DIAGNOSIS — I6529 Occlusion and stenosis of unspecified carotid artery: Secondary | ICD-10-CM

## 2011-02-01 ENCOUNTER — Telehealth: Payer: Self-pay | Admitting: Internal Medicine

## 2011-02-01 NOTE — Telephone Encounter (Signed)
Pt had echo and stress echo 3/23, results not in epic yet, echo dept. Did pull reports test are both normal pt is aware

## 2011-02-01 NOTE — Telephone Encounter (Signed)
PT CALLING RE TEST RESULTS

## 2011-02-04 ENCOUNTER — Telehealth: Payer: Self-pay | Admitting: *Deleted

## 2011-02-04 NOTE — Telephone Encounter (Signed)
Pt aware of carotid u/s results

## 2011-02-08 ENCOUNTER — Encounter: Payer: No Typology Code available for payment source | Admitting: *Deleted

## 2011-02-10 ENCOUNTER — Encounter: Payer: Self-pay | Admitting: Internal Medicine

## 2011-02-14 ENCOUNTER — Other Ambulatory Visit: Payer: Self-pay | Admitting: *Deleted

## 2011-02-14 MED ORDER — OMEPRAZOLE 40 MG PO CPDR: 40.0000 mg | DELAYED_RELEASE_CAPSULE | Freq: Every day | ORAL | Status: DC

## 2011-02-16 LAB — COMPREHENSIVE METABOLIC PANEL
AST: 21 U/L (ref 0–37)
CO2: 29 mEq/L (ref 19–32)
Calcium: 9.3 mg/dL (ref 8.4–10.5)
Creatinine, Ser: 0.92 mg/dL (ref 0.4–1.2)
GFR calc Af Amer: >60 mL/min (ref >60)
GFR calc non Af Amer: >60 mL/min (ref >60)
Glucose, Bld: 104 mg/dL — ABNORMAL HIGH (ref 70–99)

## 2011-02-16 LAB — DIFFERENTIAL
Lymphocytes Relative: 38 % (ref 12–46)
Lymphs Abs: 2.1 10*3/uL (ref 0.7–4.0)
Monocytes Absolute: 0.4 10*3/uL (ref 0.1–1.0)
Monocytes Relative: 8 % (ref 3–12)
Neutro Abs: 2.8 10*3/uL (ref 1.7–7.7)

## 2011-02-16 LAB — CBC
MCHC: 34.5 g/dL (ref 30.0–36.0)
MCV: 90.1 fL (ref 78.0–100.0)
RBC: 4.45 MIL/uL (ref 3.87–5.11)
RDW: 13.3 % (ref 11.5–15.5)

## 2011-02-16 LAB — APTT: aPTT: 28 seconds (ref 24–37)

## 2011-02-16 LAB — PROTIME-INR
INR: 1.3 (ref 0.00–1.49)
Prothrombin Time: 16.4 seconds — ABNORMAL HIGH (ref 11.6–15.2)

## 2011-02-24 ENCOUNTER — Other Ambulatory Visit: Payer: Self-pay | Admitting: Internal Medicine

## 2011-03-01 ENCOUNTER — Telehealth: Payer: Self-pay | Admitting: Internal Medicine

## 2011-03-01 NOTE — Telephone Encounter (Signed)
4 month history of rectal bleeding.  Was seen in December for the same problem,.  She was prescribed suppositories that minimally improved her symptoms.  She has had 3 BM with blood this am, every once in a while she will see clots of blood.  She has no pain with BMs, but does c/o some lower abdominal cramping and back pain. Stools can be loose or solid and has been going on for several months.  She will come in tomorrow and see Amy Esterwood PA at 1:30.

## 2011-03-02 ENCOUNTER — Ambulatory Visit (INDEPENDENT_AMBULATORY_CARE_PROVIDER_SITE_OTHER): Payer: Medicare Other | Admitting: Physician Assistant

## 2011-03-02 ENCOUNTER — Encounter: Payer: Self-pay | Admitting: Physician Assistant

## 2011-03-02 ENCOUNTER — Other Ambulatory Visit (INDEPENDENT_AMBULATORY_CARE_PROVIDER_SITE_OTHER): Payer: Medicare Other

## 2011-03-02 DIAGNOSIS — K5140 Inflammatory polyps of colon without complications: Secondary | ICD-10-CM

## 2011-03-02 DIAGNOSIS — K625 Hemorrhage of anus and rectum: Secondary | ICD-10-CM

## 2011-03-02 DIAGNOSIS — K51411 Inflammatory polyps of colon with rectal bleeding: Secondary | ICD-10-CM

## 2011-03-02 LAB — CBC WITH DIFFERENTIAL/PLATELET
Basophils Relative: 0.6 % (ref 0.0–3.0)
Eosinophils Absolute: 0.2 10*3/uL (ref 0.0–0.7)
Eosinophils Relative: 2.2 % (ref 0.0–5.0)
Lymphocytes Relative: 41.4 % (ref 12.0–46.0)
MCHC: 34.3 g/dL (ref 30.0–36.0)
Monocytes Relative: 8.1 % (ref 3.0–12.0)
Neutrophils Relative %: 47.7 % (ref 43.0–77.0)
RBC: 4.18 Mil/uL (ref 3.87–5.11)
WBC: 7.1 10*3/uL (ref 4.5–10.5)

## 2011-03-02 LAB — PROTIME-INR: INR: 3.2 ratio — ABNORMAL HIGH (ref 0.8–1.0)

## 2011-03-02 MED ORDER — HYDROCORTISONE ACETATE 25 MG RE SUPP: RECTAL | Status: DC

## 2011-03-02 NOTE — Patient Instructions (Signed)
Please go to the basement level to have your labs drawn.  We scheduled the Flex Sigmoidoscopy with Dr. Juanda Chance on 03-10-2011. Directions and brochure provided.

## 2011-03-03 ENCOUNTER — Telehealth: Payer: Self-pay | Admitting: *Deleted

## 2011-03-03 ENCOUNTER — Encounter: Payer: Self-pay | Admitting: Physician Assistant

## 2011-03-03 ENCOUNTER — Ambulatory Visit (INDEPENDENT_AMBULATORY_CARE_PROVIDER_SITE_OTHER): Payer: Medicare Other | Admitting: Internal Medicine

## 2011-03-03 DIAGNOSIS — I4891 Unspecified atrial fibrillation: Secondary | ICD-10-CM

## 2011-03-03 LAB — PROTIME-INR

## 2011-03-03 NOTE — Progress Notes (Signed)
Subjective:    Patient ID: [name], female    DOB: 02/06/1946, 65 y.o.   MRN: [id number]  HPI [name] is a 65 year old white female known to Dr. Lina Sar with history of an esophageal stricture and colon polyps. She was seen last in December of 2011 for painless rectal bleeding. At that time she was occasionally seen some clots in the toilet as well had not noted any changes in her bowel habits. Hemoglobin was normal at 13.1. She is on chronic Coumadin for history of atrial fibrillation. She was seen by [facility] and on anoscopy felt to have internal hemorrhoids which were inflamed. She was treated with a course of Anusol-HC suppositories. Patient says that the suppositories did seem to help decrease the bleeding for a while but then it recurred. Now over the past couple of months she is seeing blood on a regular basis, usually with each bowel movement. She says her stools are generally not hard and she is not having difficulty with straining. Occasionally she does get some low back discomfort. She is concerned at this time because over the past month she feels that she's passing more blood than she had previously, and that there is blood mixed in with her bowel movements not just blood in the commode or on the tissue. She says that she is frequently seen dark clots mixed in with her bowel movements as well.  From her last colonoscopy was done in September of 2011 him and she had 3 polyps found in moderate diverticulosis. 2 of the polyps were found in the rectum at 10 cm and 5 cm and were hyperplastic.  She is concerned that she is bleeding from something besides hemorrhoids.    Review of Systems  Constitutional: Negative.   HENT: Negative.   Eyes: Negative.   Respiratory: Negative.   Cardiovascular: Negative.   Gastrointestinal: Positive for blood in stool and anal bleeding.  Genitourinary: Negative.   Musculoskeletal: Positive for back pain.  Skin: Negative.     Neurological: Negative.   Hematological: Negative.   Psychiatric/Behavioral: Negative.        Objective:   Physical Exam Well-developed white female in no acute distress, alert and oriented x3, pleasant HEENT nontraumatic normocephalic EOMI PERRLA sclera anicteric  Neck; supple no JVD  Cardiovascular; regular rate and rhythm with S1-S2 no murmur rub or gallop  Pulmonary; clear bilaterally Abdomen soft nontender nondistended bowel sounds active no palpable mass or hepatosplenomegaly  Rectal; no external hemorrhoids on anoscopy she has some oozing of fresh blood and one inflamed internal hemorrhoid question polypoid tissue.  Skin; warm and dry benign without lesion  Psych; mood and affect appropriate        Assessment & Plan:  #14 65 year old female with persistent rectal bleeding, in the setting of chronic Coumadin. Previous anoscopy showed inflamed internal hemorrhoids. Patient also had rectal polyps on colonoscopy September 2011. She does have small internal hemorrhoids on current exam, but am concerned she may have some polypoid tissue in the rectum as well.  Plan; Check CBC today  Resume Anusol-HC suppositories at bedtime x2-3 weeks then as needed if bleeding recurs. After discussion with the patient, Will schedule for flexible sigmoidoscopy with Dr. Juanda Chance to reevaluate and assure no other bleeding source other than internal hemorrhoids. If the flexible sigmoidoscopy is otherwise unremarkable, patient may want to consider surgical management for her hemorrhoids.  #2 Chronic anticoagulation secondary to history of atrial fibrillation.  #3 diverticulosis  #4 History of hyperplastic  colon polyps.

## 2011-03-03 NOTE — Progress Notes (Signed)
Reviewed and agree with management. Carl E. Gessner, MD, FACG  

## 2011-03-03 NOTE — Telephone Encounter (Signed)
Left a message for patient to call me at home and cell number. 

## 2011-03-03 NOTE — Telephone Encounter (Signed)
Message copied by Jesse Fall on Thu Mar 03, 2011 10:39 AM ------      Message from: [place], [place]      Created: Thu Mar 03, 2011 10:10 AM       Please let Kori know her hgb is very stable at 13.2,so no significant blood loss. inr is 3.2, we should fax this to whoever manages her coumadin thanks:)

## 2011-03-04 ENCOUNTER — Telehealth: Payer: Self-pay | Admitting: *Deleted

## 2011-03-04 ENCOUNTER — Encounter: Payer: Self-pay | Admitting: Internal Medicine

## 2011-03-04 NOTE — Telephone Encounter (Signed)
Message copied by Jesse Fall on Fri Mar 04, 2011  9:14 AM ------      Message from: [place], [place]      Created: Thu Mar 03, 2011 10:10 AM       Please let [name] know her hgb is very stable at 13.2,so no significant blood loss. inr is 3.2, we should fax this to whoever manages her coumadin thanks:)

## 2011-03-04 NOTE — Telephone Encounter (Signed)
Patient given results as per Mike Gip, PA. Patient states the Coumadin in managed by [facility] and they have already called her about the INR. Faxed results as requested.

## 2011-03-10 ENCOUNTER — Other Ambulatory Visit: Payer: Medicare Other | Admitting: Internal Medicine

## 2011-03-21 ENCOUNTER — Encounter: Payer: Medicare Other | Admitting: *Deleted

## 2011-03-22 NOTE — Op Note (Signed)
NAME:  [name], [name]             ACCOUNT NO.:  [id number]   MEDICAL RECORD NO.:  [id number]          PATIENT TYPE:  AMB   LOCATION:  DAY                          FACILITY:  [facility]   PHYSICIAN:  Adolph Pollack, M.D.DATE OF BIRTH:  [birth year]   DATE OF PROCEDURE:  02/27/2009  DATE OF DISCHARGE:                               OPERATIVE REPORT   PREOPERATIVE DIAGNOSIS:  Symptomatic cholelithiasis.   POSTOPERATIVE DIAGNOSIS:  Symptomatic cholelithiasis.   PROCEDURE:  Laparoscopic cholecystectomy with intraoperative  cholangiogram.   SURGEON:  Adolph Pollack, M.D.   ASSISTANT:  Amber L. Freida Busman, M.D.   ANESTHESIA:  General.   INDICATIONS:  Ms. Miguez is a 65 year old female who has had some  biliary colic type pain and has cholelithiasis.  She has had cardiac  clearance.  She is on chronic anticoagulation, but her Coumadin has been  held, and now she presents for elective cholecystectomy.   TECHNIQUE:  She is brought to the operating room and placed supine on  the operating table and a general anesthetic was administered.  Her  abdominal wall was sterilely prepped and draped.  In the supraumbilical  region, Marcaine was infiltrated in the skin and subcutaneous tissue.  An incision was made through the skin and subcutaneous tissues until the  midline fascia was identified.  A small incision was made in the midline  fascia and the peritoneal cavity was entered under direct vision.  A  pursestring suture of 0 Vicryl was placed around the fascial edges.  A  Hassan trocar was introduced into the peritoneal cavity and a  pneumoperitoneum created by insufflation of CO2 gas.   The laparoscope was introduced and there was no underlying bleeding or  organ injury.  She was placed in the reverse Trendelenburg position and  the right side tilted slightly up.  An 11-mm trocar was placed through  an epigastric incision and two 5-mm trocars placed in the right upper  quadrant.  The fundus  of the gallbladder was grasped and the gallbladder  was noted to have a dull red color consistent with chronic inflammatory  change.  There were omental and duodenal adhesions to the gallbladder.  These were taken down bluntly and sharply.  The fundus of the  gallbladder was then retracted toward the right shoulder.  Using blunt  dissection, the infundibulum was mobilized, with dissection staying on  the gallbladder.  I then identified the cystic duct, created a window  around it, and achieved a critical view.  A clip was placed at the  cystic duct/gallbladder junction.  A small incision was made in the  cystic duct.  A cholangiocath was passed through the anterior abdominal  wall, placed into the cystic duct, and the cholangiogram was performed.   Under real time fluoroscopy, dilute contrast material was injected into  the cystic duct which was of moderate length.  The common hepatic, right  and left hepatic, common bile ducts all filled promptly and contrast  drained promptly into the duodenum without obvious evidence of  obstruction.  Final report is pending the radiologist's interpretation.   Following this, the cholangiocatheter  was removed, the cystic duct was  clipped 3 times on the biliary side and divided.  I then identified both  an anterior and posterior branch of the cystic artery close to the  gallbladder.  Windows were created around these and they were clipped  and divided.  The gallbladder was then dissected free from the liver  intact using electrocautery and placed in an Endopouch bag.   The gallbladder fossa was copiously irrigated and bleeding points were  controlled with electrocautery.  Further inspection demonstrated no  bleeding and no bile.  Irrigation fluid was evacuated as much as  possible.  A piece of Surgicel was placed in the gallbladder fossa.   The gallbladder in the Endopouch bag was then removed through the  subumbilical port and the subumbilical  fascial defect closed under  laparoscopic vision by tightening up and tying down the pursestring  suture.  The remaining trocars were removed and the CO2 was released.   The skin incisions were closed with 4-0 Monocryl subcuticular stitches  followed by Steri-Strips and sterile dressings.  She tolerated the  procedure without any apparent complications and was taken to recovery  in satisfactory condition.      Adolph Pollack, M.D.  Electronically Signed     TJR/MEDQ  D:  02/27/2009  T:  02/27/2009  Job:  [id number]   cc:   Hedwig Morton. Juanda Chance, MD  [address]  [place]  [place] [address]   Bevelyn Buckles. Bensimhon, MD  [address], [place] [address]

## 2011-03-22 NOTE — Assessment & Plan Note (Signed)
[facility] OFFICE NOTE   NAME:[name], [name]                    MRN:          [id number]  DATE:07/17/2008                            DOB:          [birth year]    PRIMARY CARE Paisely Brick:  Atha Starks. Bean, FNP   INTERVAL HISTORY:  [name] is a very pleasant 65 year old woman with  a history of hypertension, glucose intolerance, sleep apnea, and  paroxysmal atrial fibrillation.  She previously underwent a dobutamine  MRI at [facility] which was normal.   She presents today for a scheduled visit.  On Tuesday, she had an  episode of what sounds like recurrent atrial fibrillation with rapid  ventricular response.  Her heart rate she says was up in the 160s.  She  felt lightheaded and somewhat tight in her chest.  This lasted about 2-3  hours.  She ended up taking an extra dose of propafenone and it  resolved.  This was the first episode she really had since April 2008.  She is wondering if we need to do anything different.   Otherwise she is doing quite well.  She denies any exertional chest pain  or significant dyspnea.  She has not had any problems with her Coumadin.  She does drink an occasional cup of coffee, but no clear triggers to her  last episode.   CURRENT MEDICATIONS:  1. Atacand 32 a day.  2. Coumadin.  3. Propafenone 225 b.i.d.  4. Toprol XL 50.  5. Ambien 12.5.   PHYSICAL EXAMINATION:  GENERAL:  She is in no acute distress, ambulates  in and around the office without any respiratory difficulty.  VITAL SIGNS:  Blood pressure is 160/76, heart rate is 55, weight is 193.  HEENT:  Normal.  NECK:  Supple.  There is no JVD.  Carotids are 2+ plus bilateral without  bruits.  There is no lymphadenopathy or thyromegaly.  CARDIAC:  PMI is  nondisplaced.  She is bradycardic and regular.  No obvious murmurs, rubs  or gallops.  LUNGS:  Clear.  ABDOMEN:  Obese, nontender, nondistended.  No  hepatosplenomegaly.  No  bruits.  No masses.  Good bowel sounds.  EXTREMITIES:  Warm with no  cyanosis, clubbing or edema.  No rash.  NEURO:  Alert and oriented x3.  Cranial nerves II through XII are  intact.  Moves all four extremities without difficulty.   EKG shows sinus bradycardia at the rate of 55 with no ST-T wave  abnormalities.   ASSESSMENT AND PLAN:  1. Recurrent paroxysmal atrial fibrillation with rapid ventricular      response.  We had a fairly lengthy discussion about the      intermittent nature of atrial fibrillation.  Given that this is her      first episode in over a year and half, I think the propafenone has      been very successful.  I told her I think we ought to continue on      with propafenone therapy.  However, she does continue to have more  frequent episodes.  I did tell her that there are other options      such as other medications or even ablation.  She is quite      comfortable with continuing the propafenone and also will use the      pill in the pocket method as needed for any breakthroughs.  2. Hypertension.  Blood pressure is elevated today.  She says usually      it is more in the 130 range.  I have asked her to keep her blood      pressure log and followup with Billie Bean.   I will see her back in 2 months for followup.  I told her she is having  more atrial fibrillation, she should contact me and come in sooner.     Bevelyn Buckles. Bensimhon, MD  Electronically Signed    DRB/MedQ  DD: 07/17/2008  DT: 07/18/2008  Job #: [id number]

## 2011-03-25 NOTE — Assessment & Plan Note (Signed)
[name] OFFICE NOTE   [name], [name]                      MRN:          [id number]  DATE:12/21/2006                            DOB:          1946/08/24    PRIMARY CARDIOLOGIST:  Jonelle Sidle, M.D.   PRIMARY CARE PHYSICIAN:  Billie D. Bean, F.N.P.   INTERVAL HISTORY:  [name] is a very pleasant 65 year old woman with  a history of hypertension, glucose intolerance, sleep apnea, and  paroxysmal atrial fibrillation.  Apparently she underwent dobutamine MRI  at [facility] yesterday with Dr. Rosaland Lao as part of her  research study.  The MRI was normal with a normal ejection fraction and  no evidence of ischemia.  However, she did develop atrial fibrillation  with rapid ventricular response with ventricular rates up to 170.  She  was given beta blocker.  They called our office and she was set up to  come in for an acute care visit today.  This morning she is back in  sinus rhythm with a rate of 53.  She tells me that over the past few  weeks she has been having daily multiple episodes of palpitations with  rapid heart rates.  These leave her feeling very weak.  She did have a  Holter monitor at the end of last year which showed multiple runs of  atrial fibrillation with RVR.  She denies any chest pain, no orthopnea  or PND.   CURRENT MEDICATIONS:  1. Toprol-XL 50 a day.  2. Aspirin 325 a day.  3. Atacand 32 a day.  4. Ambien 12.5 q.h.s.   PHYSICAL EXAMINATION:  GENERAL:  She is well-appearing in no acute  distress, ambulates around the clinic without any respiratory  difficulty.  VITAL SIGNS:  Blood pressure is 156/86, the heart rate is 53, her weight  is 184.  HEENT:  Sclerae anicteric, EOMI.  There is no xanthelasmas.  Mucous  membranes are moist.  NECK:  Supple, no JVD.  Carotids are 2+ bilaterally without any bruits.  There is no lymphadenopathy or thyromegaly.  CARDIAC:  She  is bradycardic and regular.  There are no murmurs, rubs or  gallops.  LUNGS:  Clear.  ABDOMEN:  Obese, nontender, nondistended.  No hepatosplenomegaly, no  bruits, no masses appreciated.  EXTREMITIES:  Warm with no cyanosis, clubbing or edema; good distal  pulses.  NEUROLOGIC:  Alert and oriented x3.  Cranial nerves II-XII are intact.  Moves all four extremities without difficulty.  Affect is bright.   EKG shows sinus bradycardia at a rate of 53.   Echocardiogram from December 2007 shows an EF of 60-65%, trivial mitral  regurgitation.  There is mild septal hypertrophy but otherwise no  significant LVH.   ASSESSMENT AND PLAN:  Paroxysmal atrial fibrillation.  This seems like  it has become much more persistent for her and is quite symptomatic.  She also has evidence of sick sinus syndrome with resting bradycardia.  I have discussed it with Dr. Diona Browner and I do think the best thing is  to put her on  an antiarrhythmic and will start her on Rythmol SR 225 mg  b.i.d.  We also had a long discussion about the risks and benefits about  aspirin versus Coumadin.  Her CHADS score is 1, due to her hypertension.  However, she does also have glucose intolerance and I think that would  push me toward Coumadin anticoagulation.  We will go ahead and start her  on this and stop her aspirin.  She will followup in the [facility]  with Billie Bean.  Her TSH was on the low end of normal previously and  we will recheck her thyroid functions today.  Finally, she will be set  up for a stress test next week to rule out any proarrhythmia from the  propafenone.     Bevelyn Buckles. Bensimhon, MD  Electronically Signed    DRB/MedQ  DD: 12/21/2006  DT: 12/21/2006  Job #: [id number]   cc:   Jonelle Sidle, MD  Atha Starks. Bean, FNP

## 2011-03-25 NOTE — Assessment & Plan Note (Signed)
[facility] HEALTHCARE                            CARDIOLOGY OFFICE NOTE   NAME:WRIGHTTeeghan, Hammer                    MRN:          [id number]  DATE:12/20/2006                            DOB:          08/19/1946    This is a telephone call from Dr. Tilman Neat, [facility], [phone], concerning Lauralee Evener.   She had a dobutamine MRI today as part of a study.  I am not sure of the  name of the study.  She has a history of atrial fib and atrial tach.  She developed atrial fib, atrial flutter. There is no evidence of  ischemia.  She is still in atrial fib.  Dr. Lucianne Muss says she is quite  stable; her rhythm is stable.  She had held her beta blocker today and  is restarting that and suggested that she be seen here in the office  tomorrow.  Dr. Ladona Ridgel is the DOD.  We will give her an appointment to be  seen tomorrow, 12/21/2006, by Dr. Lewayne Bunting.     Cecil Cranker, MD, [facility]  Electronically Signed    EJL/MedQ  DD: 12/20/2006  DT: 12/20/2006  Job #: [id number]

## 2011-03-25 NOTE — Assessment & Plan Note (Signed)
[facility]                                 ON-CALL NOTE   NAME:Choi, Emily                    MRN:          [id number]  DATE:02/16/2007                            DOB:          [birth year]    PRIMARY CARDIOLOGIST:  Jonelle Sidle, MD   I received a call this evening from [name] who has a history of  paroxysmal atrial fibrillation on Rythmol therapy since December 20, 2006.  She says her heart rate has been fast this evening, as high as into the  150's.  She is quite distraught on the telephone, crying, although she  denies any chest pain or shortness of breath. She is just upset that she  is tachycardic.  I advised that she should come to the ED for further  evaluation.  She was agreeable and will come into the [facility] ED.     Nicolasa Ducking, ANP  Electronically Signed    CB/MedQ  DD: 02/16/2007  DT: 02/16/2007  Job #: [id number]   cc:   Jonelle Sidle, MD

## 2011-03-25 NOTE — Procedures (Signed)
NAME:  [name], [name] NO.:  [id number]   MEDICAL RECORD NO.:  [id number]          PATIENT TYPE:  OUT   LOCATION:  SLEEP CENTER                 FACILITY:  [facility]   PHYSICIAN:  Marcelyn Bruins, M.D. [facility] DATE OF BIRTH:  10/04/1946   DATE OF STUDY:  08/04/2004                              NOCTURNAL POLYSOMNOGRAM   REFERRING PHYSICIAN:  Laurita Quint, MD   INDICATIONS FOR STUDY:  Hypersomnia with sleep apnea.   SLEEP ARCHITECTURE:  The patient had a total sleep time of only 194 minutes  with a sleep efficiency of 43%. She was extremely restless during the entire  study.  The patient had minimal REM and never achieved slow wake sleep.  Sleep onset latency was very prolonged at 87 minutes and REM latency was  prolonged as well.   IMPRESSION/RECOMMENDATIONS:  1.  Moderate obstructive sleep apnea/hypopnea syndrome with O2 desaturations      low as 85%. The patient was found to have a respiratory disturbance      index of 28 events per hour and was associated with moderate snoring.      The patient did not meet split night  criteria due to the most of the      events occurring in the latter half of the night.  2.  No clinically significant cardiac arrhythmias.  3.  Large numbers of periodic leg movements with significant sleep      disruption. Clinical correlation is suggested if the patient does not      respond adequately to appropriate therapy for her obstructive sleep      apnea.      KC/MEDQ  D:  08/27/2004 17:14:35  T:  08/28/2004 04:54:09  Job:  [id number]
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
## 2011-03-25 NOTE — Assessment & Plan Note (Signed)
[facility]                            CARDIOLOGY OFFICE NOTE   NAME:WRIGHTJurnie, Garritano                    MRN:          [id number]  DATE:10/26/2006                            DOB:          [birth year]    PRIMARY CARE Malvika Tung:  Everrett Coombe, FNP   REASON FOR VISIT:  Follow-up cardiac testing.   HISTORY OF PRESENT ILLNESS:  I saw Mrs. Prange back in early December.  She has paroxysmal atrial fibrillation/atrial tachycardia.  She had had  prior reassuring ischemic evaluation and we referred her for TSH level  which was in the normal range at 0.62 and also a resting 2-D  echocardiogram which revealed an ejection fraction of 60% to 65% with no  wall motion abnormalities and no major valvular abnormalities.  She had  mildly increased pulmonary artery pressures with only trivial tricuspid  regurgitation, likely not clinically significant.   An electrocardiogram today showed sinus rhythm at 60 beats per minute.  She states that she tolerated well the addition of Toprol XL to her  regimen.  She has fewer palpitations and has felt better.  We otherwise  have her on aspirin at this point.   ALLERGIES:  CODEINE   PRESENT MEDICATIONS:  1. Toprol XL 50 mg p.o. daily.  2. Aspirin 325 mg p.o. daily.  3. Ambien 12.5 mg p.o. nightly.  4. Atacand 32 mg p.o. daily.   REVIEW OF SYSTEMS:  See history of present illness.   EXAMINATION:  Blood pressure 130/86, heart rate 60, weight 185 pounds.  The patient is comfortable, in no acute distress.  NECK:  No elevated jugular pressure, no bruits.  No thyromegaly.  LUNGS:  Clear without labored breathing.  CARDIAC:  Regular rate and rhythm without murmur, rub or gallop.  EXTREMITIES:  No pitting edema.   IMPRESSION:  1. Paroxysmal atrial fibrillation/atrial tachycardia.  We will plan to      continue the present medical      regimen and have him return for symptom follow up over the next 6      months.  2. Otherwise  continue regular followup with Billie Bean.     Jonelle Sidle, MD  Electronically Signed    SGM/MedQ  DD: 10/26/2006  DT: 10/26/2006  Job #: [id number]   cc:   Billie D. Bean, FNP  Melvyn Novas, M.D.
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
## 2011-03-25 NOTE — Procedures (Signed)
[facility] HEALTH[facility]                              EXERCISE TREADMILL   NAME:WRIGHTAprile, Dickenson                    MRN:          [id number]  DATE:01/03/2007                            DOB:          10/24/1946    PATIENT IDENTIFICATION:  [name] is a 65 year old woman followed by  Dr. Diona Browner.  She was recently discovered to have a paroxysmal atrial  fibrillation with multiple episodes of rapid ventricular response which  were symptomatic.  She was started on Propafenone.  She is here today  for routine stress testing to rule out Proarrhythmia.   EXERCISE TEST:  The patient walked for 5 minutes on a modified Bruce  protocol.  Peak heart rate was 116.  There was no chest pain.  She did  stop because of shortness of breath.  There were no ST-T wave changes  with exercise.  There was no evidence of ventricular ectopy.  There was  no chest pain.  She had good heart rate recovery.   CONCLUSIONS:  1. Normal exercise treadmill test with no events of proarrhythmia or      exercise-induced ischemia.  2. She does seem deconditioned and I have suggested starting a routine      exercise program.     Bevelyn Buckles. Bensimhon, MD  Electronically Signed    DRB/MedQ  DD: 01/03/2007  DT: 01/03/2007  Job #: [id number]

## 2011-03-25 NOTE — Assessment & Plan Note (Signed)
[facility]                            CARDIOLOGY OFFICE NOTE   NAME:WRIGHTAnya, [name]                    MRN:          [id number]  DATE:01/22/2007                            DOB:          16-Jul-1946    PRIMARY CARE PHYSICIAN:  Emily Starks. Bean, FNP   REASON FOR VISIT:  Routine cardiac followup.   HISTORY OF PRESENT ILLNESS:  [name] was last in the office in  February.  She has a history of paroxysmal atrial fibrillation with  recent reassuring ischemic evaluation via a dobutamine cardiac MRI at  [facility] showing no evidence of ischemia.  We initially managed  her with beta blocker therapy and aspirin; however, given recurrent  episodes of atrial fibrillation she was ultimately placed on Coumadin as  well as Rythmol as detailed below.  She saw Dr. Gala Romney in February  with these changes, and ultimately was referred for a followup exercise  treadmill test which showed no evidence of proarrhythmia or exercise-  induced ischemia.  I discussed this with her today.  She states that  generally  she has felt fairly well.  She initially had some continued  breakthroughs, although has had no major palpitations of late.  She is  apparently being considered for arthroscopic knee surgery due to a  medial meniscal tear on the right.  She is followed through our Coumadin  Clinic at this point.   ALLERGIES:  CODEINE.   PRESENT MEDICATIONS:  1. Rhymol SR 225 mg p.o. b.i.d.  2. Toprol XL 50 mg p.o. daily.  3. Atacand 32 mg p.o. daily.  4. Coumadin as directed by the Coumadin Clinic.  5. Ambien 12.5 mg p.o. q.h.s.   REVIEW OF SYSTEMS:  As described in the present illness.  She does state  that her blood pressure has been elevated some in the evenings, although  admits that she was not initially was not taking her Atacand regularly.  She has been doing this more regularly now.   PHYSICAL EXAMINATION:  Blood pressure 126/94, heart rate 60, weight  192  pounds.  The patient is comfortable in no acute distress.  NECK:  Examination of the neck reveals no elevated jugular venous  distention, no bruits, no thyromegaly.  LUNGS:  Clear without labored breathing.  CARDIAC EXAM:  Reveals a regular rate and rhythm.  No loud murmur or S3  gallops.  EXTREMITIES:  Showed no pitting edema.   IMPRESSION AND RECOMMENDATIONS:  1. Paroxysmal atrial fibrillation, under better control at this point,      on both Rhymol SR and Toprol XL as well as Coumadin.  No      proarrhythmia demonstrated by followup treadmill test and no      underlying ischemic heart disease evident by dobutamine cardiac MRI      through [facility].  2. From the prospective of pre-operative assessment prior to      arthroscopic knee surgery, [name] should be able to proceed as      planned.  She can certainly come off of Coumadin temporarily for      this, although  I would continue her other medications. She should      not require any additional cardiac studies given her reassuring      testing within the last 6 months.  I will otherwise plan to see her      back over the next 6 months assume that her rhythm remains stable.     Jonelle Sidle, MD  Electronically Signed    SGM/MedQ  DD: 01/22/2007  DT: 01/23/2007  Job #: [id number]   cc:   Emily D. Jillyn Hidden, FNP  [name], M.D.

## 2011-04-18 ENCOUNTER — Other Ambulatory Visit: Payer: Self-pay | Admitting: *Deleted

## 2011-04-18 MED ORDER — OMEPRAZOLE 40 MG PO CPDR: 40.0000 mg | DELAYED_RELEASE_CAPSULE | Freq: Every day | ORAL | Status: DC

## 2011-04-27 ENCOUNTER — Encounter: Payer: Self-pay | Admitting: Cardiovascular Disease

## 2011-05-03 ENCOUNTER — Ambulatory Visit (INDEPENDENT_AMBULATORY_CARE_PROVIDER_SITE_OTHER): Payer: Medicare Other | Admitting: *Deleted

## 2011-05-03 ENCOUNTER — Other Ambulatory Visit: Payer: Self-pay

## 2011-05-03 DIAGNOSIS — I4891 Unspecified atrial fibrillation: Secondary | ICD-10-CM

## 2011-05-03 MED ORDER — WARFARIN SODIUM 5 MG PO TABS: ORAL_TABLET | ORAL | Status: DC

## 2011-05-23 ENCOUNTER — Other Ambulatory Visit: Payer: Self-pay | Admitting: Internal Medicine

## 2011-05-31 ENCOUNTER — Ambulatory Visit (INDEPENDENT_AMBULATORY_CARE_PROVIDER_SITE_OTHER): Payer: Medicare Other | Admitting: *Deleted

## 2011-05-31 DIAGNOSIS — I4891 Unspecified atrial fibrillation: Secondary | ICD-10-CM

## 2011-06-08 ENCOUNTER — Ambulatory Visit (INDEPENDENT_AMBULATORY_CARE_PROVIDER_SITE_OTHER): Payer: Medicare Other | Admitting: Family Medicine

## 2011-06-08 ENCOUNTER — Encounter: Payer: Self-pay | Admitting: Family Medicine

## 2011-06-08 VITALS — BP 130/84 | HR 80 | Temp 98.6°F | Wt 195.5 lb

## 2011-06-08 DIAGNOSIS — J069 Acute upper respiratory infection, unspecified: Secondary | ICD-10-CM

## 2011-06-08 DIAGNOSIS — Z23 Encounter for immunization: Secondary | ICD-10-CM

## 2011-06-08 DIAGNOSIS — Z2911 Encounter for prophylactic immunotherapy for respiratory syncytial virus (RSV): Secondary | ICD-10-CM

## 2011-06-08 MED ORDER — AZITHROMYCIN 250 MG PO TABS: ORAL_TABLET | ORAL | Status: DC

## 2011-06-08 NOTE — Progress Notes (Signed)
SUBJECTIVE:  [name] is a 65 y.o. female who complains of coryza, sneezing, sore throat, post nasal drip and dry cough for 7 days. She denies a history of anorexia, chest pain, chills, dizziness, fevers, shortness of breath and vomiting and denies a history of asthma. Patient denies smoke cigarettes.   Patient Active Problem List  Diagnoses  . HYPERCHOLESTEROLEMIA  . INSOMNIA, CHRONIC  . RESTLESS LEG SYNDROME  . HYPERTENSION  . CHOLELITHIASIS  . SLEEP APNEA  . FATIGUE  . HEMORRHOIDS-INTERNAL  . HIATAL HERNIA  . DIVERTICULAR DISEASE  . RECTAL BLEEDING  . COLONIC POLYPS, ADENOMATOUS, HX OF  . FATTY LIVER DISEASE, HX OF  . Atrial fibrillation  . CAROTID BRUIT  . DYSPNEA   Past Medical History  Diagnosis Date  . Paroxysmal atrial fibrillation   . Diverticulosis   . Hiatal hernia   . Hyperlipidemia   . Hypertension   . Chronic insomnia   . Sleep apnea   . Restless leg syndrome   . Hx of adenomatous colonic polyps   . Internal hemorrhoids   . Carotid bruit   . Fatty liver   . Colon polyps   . Pure hypercholesterolemia   . Special screening for malignant neoplasms, colon   . Routine general medical examination at a health care facility   . Other malaise and fatigue   . Screening for lipoid disorders   . Other acute sinusitis   . Acute upper respiratory infections of unspecified site    Past Surgical History  Procedure Date  . Tubal ligation   . Cholecystectomy   . Cardiovascular stress mri 12/20/06    afib  . Orif- l distal radius 9/07  . Cardiolyte neg 6/06   History  Substance Use Topics  . Smoking status: Never Smoker   . Smokeless tobacco: Never Used  . Alcohol Use: Yes     occasional   Family History  Problem Relation Age of Onset  . Breast cancer Mother   . Heart disease Father   . Diabetes      cousins  . Colon cancer Neg Hx    Allergies  Allergen Reactions  . Codeine     REACTION: vomiting   Current Outpatient Prescriptions on File  Prior to Visit  Medication Sig Dispense Refill  . amLODipine (NORVASC) 5 MG tablet Take 5 mg by mouth daily.        . hydrocortisone (ANUSOL-HC) 25 MG suppository Use 1 suppository at bedtime  14 suppository  2  . losartan (COZAAR) 100 MG tablet Take 100 mg by mouth daily.        . metoprolol (TOPROL-XL) 50 MG 24 hr tablet TAKE 1 TABLET BY MOUTH ONCE A DAY  30 tablet  3  . omeprazole (PRILOSEC) 40 MG capsule Take 1 capsule (40 mg total) by mouth daily.  30 capsule  3  . propafenone (RYTHMOL) 225 MG tablet Take 225 mg by mouth 2 (two) times daily.        Marland Kitchen warfarin (COUMADIN) 5 MG tablet Take as directed by Anticoagulation clinic  30 tablet  0  . zolpidem (AMBIEN) 10 MG tablet Take 10 mg by mouth at bedtime as needed.         The PMH, PSH, Social History, Family History, Medications, and allergies have been reviewed in [facility], and have been updated if relevant.  OBJECTIVE: BP 130/84  Pulse 80  Temp 98.6 F (37 C)  Wt 195 lb 8 oz (88.678 kg)  She  appears well, vital signs are as noted. Ears normal.  Throat and pharynx normal.  Neck supple. No adenopathy in the neck. Nose is congested. Sinuses non tender. The chest is clear, without wheezes or rales.  ASSESSMENT:  viral upper respiratory illness  PLAN: Symptomatic therapy suggested: push fluids, rest and return office visit prn if symptoms persist or worsen. Lack of antibiotic effectiveness discussed with her. Call or return to clinic prn if these symptoms worsen or fail to improve as anticipated. Rx for Zpack given if symptoms progress or don't improve with next 5-7 days.

## 2011-06-20 ENCOUNTER — Other Ambulatory Visit: Payer: Self-pay | Admitting: Internal Medicine

## 2011-06-28 ENCOUNTER — Encounter: Payer: Medicare Other | Admitting: *Deleted

## 2011-07-22 ENCOUNTER — Ambulatory Visit (INDEPENDENT_AMBULATORY_CARE_PROVIDER_SITE_OTHER): Payer: Medicare Other | Admitting: *Deleted

## 2011-07-22 DIAGNOSIS — I4891 Unspecified atrial fibrillation: Secondary | ICD-10-CM

## 2011-07-22 LAB — POCT INR: INR: 2.3

## 2011-07-27 ENCOUNTER — Other Ambulatory Visit: Payer: Self-pay | Admitting: Pharmacist

## 2011-07-27 MED ORDER — WARFARIN SODIUM 5 MG PO TABS: ORAL_TABLET | ORAL | Status: DC

## 2011-08-19 ENCOUNTER — Ambulatory Visit (INDEPENDENT_AMBULATORY_CARE_PROVIDER_SITE_OTHER): Payer: Medicare Other | Admitting: *Deleted

## 2011-08-19 ENCOUNTER — Encounter: Payer: Self-pay | Admitting: Physician Assistant

## 2011-08-19 ENCOUNTER — Ambulatory Visit (INDEPENDENT_AMBULATORY_CARE_PROVIDER_SITE_OTHER): Payer: Medicare Other | Admitting: Physician Assistant

## 2011-08-19 VITALS — BP 151/79 | HR 58 | Ht 63.0 in | Wt 197.1 lb

## 2011-08-19 DIAGNOSIS — I4891 Unspecified atrial fibrillation: Secondary | ICD-10-CM

## 2011-08-19 DIAGNOSIS — I6529 Occlusion and stenosis of unspecified carotid artery: Secondary | ICD-10-CM

## 2011-08-19 DIAGNOSIS — I10 Essential (primary) hypertension: Secondary | ICD-10-CM

## 2011-08-19 NOTE — Assessment & Plan Note (Signed)
Repeat Dopplers in March 2013.

## 2011-08-19 NOTE — Patient Instructions (Signed)
Your physician has requested that you have a carotid duplex DX 433.10 CAROTID ARTERY DISEASE THIS IS TO BE DONE 01/2012 PER SCOTT WEAVER, PA-C. This test is an ultrasound of the carotid arteries in your neck. It looks at blood flow through these arteries that supply the brain with blood. Allow one hour for this exam. There are no restrictions or special instructions.  Your physician wants you to follow-up in: 5 MONTHS FOR A CAROTID DUPLEX. You will receive a reminder letter in the mail two months in advance. If you don't receive a letter, please call our office to schedule the follow-up appointment.

## 2011-08-19 NOTE — Assessment & Plan Note (Signed)
Maintaining sinus rhythm on Rythmol.  She remains on Coumadin which is followed in our Coumadin clinic.  She does have occasional palpitations.  We discussed proceeding with an event monitor versus watchful waiting.  She prefers watchful waiting.  Followup with Dr. Gala Romney in 6 months.

## 2011-08-19 NOTE — Assessment & Plan Note (Addendum)
Elevated.  She notes blood pressures in the optimal range when she checks them at home.  She has had some episodes of blood pressures dropping into the 90s systolically.  She does not have symptoms with this.  I recommend that she continue to try to hydrate herself well.  Continue current medications.

## 2011-08-19 NOTE — Progress Notes (Signed)
History of Present Illness: Primary Cardiologist:  Dr. Arvilla Meres   [name] is a 65 y.o. female with a h/o paroxysmal atrial fibrillation, maintaining NSR on Rythmol, HTN, HLP, glucose intolerance and OSA.  She had a negative dobutamine MRI in 2007.  She saw Dr. Gala Romney last 3/12.   Last echo 3/12: EF 60-65%, mild LAE.  Last stress echo 3/12: normal.  Carotids 3/12: RICA 40-59%, LICA 0-39%.  She returns for followup.  Overall, she is doing well.  She denies chest pain.  She denies significant dyspnea.  She denies orthopnea, PND or edema.  She has occasional palpitations.  These come and go and last only a couple of minutes.  They did not remind her of her atrial fibrillation.  She denies any bleeding problems.  We discussed the results of her carotids, stress echo an echocardiogram that were done in March.  Past Medical History  Diagnosis Date  . Paroxysmal atrial fibrillation   . Diverticulosis   . Hiatal hernia   . Hyperlipidemia   . Hypertension   . Chronic insomnia   . Sleep apnea   . Restless leg syndrome   . Hx of adenomatous colonic polyps   . Internal hemorrhoids   . Carotid bruit   . Fatty liver   . Colon polyps   . Pure hypercholesterolemia   . Special screening for malignant neoplasms, colon   . Routine general medical examination at a health care facility   . Other malaise and fatigue   . Screening for lipoid disorders   . Other acute sinusitis   . Acute upper respiratory infections of unspecified site     Current Outpatient Prescriptions  Medication Sig Dispense Refill  . amLODipine (NORVASC) 5 MG tablet Take 5 mg by mouth daily.        . hydrocortisone (ANUSOL-HC) 25 MG suppository as needed. Use 1 suppository at bedtime       . losartan (COZAAR) 100 MG tablet Take 100 mg by mouth daily.        . metoprolol (TOPROL-XL) 50 MG 24 hr tablet TAKE 1 TABLET BY MOUTH ONCE A DAY  30 tablet  3  . omeprazole (PRILOSEC) 40 MG capsule Take 40 mg by mouth as  needed.        . propafenone (RYTHMOL) 225 MG tablet TAKE 1 TABLET BY MOUTH TWICE DAILY  60 tablet  5  . warfarin (COUMADIN) 5 MG tablet Take as directed by Anticoagulation clinic  30 tablet  0  . zolpidem (AMBIEN) 10 MG tablet Take 10 mg by mouth at bedtime as needed.          Allergies: Allergies  Allergen Reactions  . Codeine     REACTION: vomiting    Vital Signs: BP 151/79  Pulse 58  Ht 5\' 3"  (1.6 m)  Wt 197 lb 1.9 oz (89.413 kg)  BMI 34.92 kg/m2  PHYSICAL EXAM: Well nourished, well developed, in no acute distress HEENT: normal Neck: no JVD Cardiac:  normal S1, S2; RRR; no murmur Lungs:  clear to auscultation bilaterally, no wheezing, rhonchi or rales Abd: soft, nontender, no hepatomegaly Ext: no edema Skin: warm and dry Neuro:  CNs 2-12 intact, no focal abnormalities noted  EKG:  Sinus bradycardia, heart rate 58, rightward axis, first degree AV block with a PR interval of 210 ms, no ischemic changes  ASSESSMENT AND PLAN:

## 2011-09-19 ENCOUNTER — Other Ambulatory Visit: Payer: Self-pay | Admitting: Internal Medicine

## 2011-09-19 ENCOUNTER — Encounter: Payer: Medicare Other | Admitting: *Deleted

## 2011-09-20 ENCOUNTER — Telehealth: Payer: Self-pay

## 2011-09-20 ENCOUNTER — Ambulatory Visit (INDEPENDENT_AMBULATORY_CARE_PROVIDER_SITE_OTHER): Payer: Medicare Other | Admitting: *Deleted

## 2011-09-20 DIAGNOSIS — I4891 Unspecified atrial fibrillation: Secondary | ICD-10-CM

## 2011-09-20 NOTE — Telephone Encounter (Signed)
Pt reports onset of URI symptoms x 1 week ago.  Nasal congestion clear drainage, but also has productive cough with yellow phlegm. Pt is requesting rx for z-pak please call pt and advise. [facility]

## 2011-09-20 NOTE — Telephone Encounter (Signed)
Unfortunately, we cannot call in abx without seeing pt. Drink lots of fluids.  Treat sympotmatically with Mucinex, nasal saline irrigation, and Tylenol/Ibuprofen.  Ok to send rx for tessalon perles 100 mg three times daily as needed for cough, #30 no refills if she is interested.

## 2011-09-20 NOTE — Telephone Encounter (Signed)
Called patient on home number [phone] and there was no answer, mailbox is full and I couldn't leave a message.  I will try calling patient again later.

## 2011-09-24 ENCOUNTER — Other Ambulatory Visit: Payer: Self-pay | Admitting: Internal Medicine

## 2011-10-28 ENCOUNTER — Ambulatory Visit (INDEPENDENT_AMBULATORY_CARE_PROVIDER_SITE_OTHER): Payer: Medicare Other | Admitting: *Deleted

## 2011-10-28 DIAGNOSIS — I4891 Unspecified atrial fibrillation: Secondary | ICD-10-CM

## 2011-11-02 NOTE — Telephone Encounter (Signed)
Called patient on home number, no answer and couldn't leave message on machine.  Will wait to hear from patient.

## 2011-12-07 ENCOUNTER — Other Ambulatory Visit: Payer: Self-pay | Admitting: Internal Medicine

## 2011-12-09 ENCOUNTER — Ambulatory Visit (INDEPENDENT_AMBULATORY_CARE_PROVIDER_SITE_OTHER): Payer: Medicare Other | Admitting: Pharmacist

## 2011-12-09 DIAGNOSIS — I4891 Unspecified atrial fibrillation: Secondary | ICD-10-CM | POA: Diagnosis not present

## 2011-12-09 LAB — POCT INR: INR: 1.8

## 2011-12-21 ENCOUNTER — Other Ambulatory Visit: Payer: Self-pay | Admitting: Internal Medicine

## 2012-01-09 ENCOUNTER — Ambulatory Visit (INDEPENDENT_AMBULATORY_CARE_PROVIDER_SITE_OTHER): Payer: Medicare Other

## 2012-01-09 ENCOUNTER — Telehealth: Payer: Self-pay | Admitting: Internal Medicine

## 2012-01-09 ENCOUNTER — Telehealth: Payer: Self-pay

## 2012-01-09 DIAGNOSIS — I4891 Unspecified atrial fibrillation: Secondary | ICD-10-CM

## 2012-01-09 NOTE — Telephone Encounter (Signed)
Please have her f/u with another provider

## 2012-01-09 NOTE — Telephone Encounter (Signed)
Pt wanting to know if Dr Gala Romney will be continuing to see her at the Heart Failure clinic or if she needs to establish with a new cardiologist?

## 2012-01-09 NOTE — Telephone Encounter (Signed)
Staff message sent to schedulers to schedule an appt for her.

## 2012-01-09 NOTE — Telephone Encounter (Signed)
Walk In Pt Form " Pt Of Bensimhon, Needs to know if he will be assigned to  Another Doctor" sent to Message Nurse  01/09/12/KM

## 2012-01-09 NOTE — Telephone Encounter (Signed)
Please schedule an appt to get established with a new cardiologist.

## 2012-01-09 NOTE — Telephone Encounter (Signed)
Pt was notified and states she would like to establish with Dr [place] if possible.

## 2012-01-19 ENCOUNTER — Other Ambulatory Visit (HOSPITAL_COMMUNITY): Payer: Self-pay | Admitting: Internal Medicine

## 2012-01-20 DIAGNOSIS — G4700 Insomnia, unspecified: Secondary | ICD-10-CM | POA: Diagnosis not present

## 2012-01-26 ENCOUNTER — Other Ambulatory Visit: Payer: Self-pay | Admitting: *Deleted

## 2012-01-26 DIAGNOSIS — I6529 Occlusion and stenosis of unspecified carotid artery: Secondary | ICD-10-CM

## 2012-01-31 ENCOUNTER — Encounter (INDEPENDENT_AMBULATORY_CARE_PROVIDER_SITE_OTHER): Payer: Medicare Other

## 2012-01-31 DIAGNOSIS — I6529 Occlusion and stenosis of unspecified carotid artery: Secondary | ICD-10-CM | POA: Diagnosis not present

## 2012-01-31 DIAGNOSIS — I779 Disorder of arteries and arterioles, unspecified: Secondary | ICD-10-CM

## 2012-01-31 HISTORY — DX: Disorder of arteries and arterioles, unspecified: I77.9

## 2012-02-15 ENCOUNTER — Ambulatory Visit (INDEPENDENT_AMBULATORY_CARE_PROVIDER_SITE_OTHER): Payer: Medicare Other | Admitting: Cardiology

## 2012-02-15 ENCOUNTER — Encounter: Payer: Self-pay | Admitting: Cardiology

## 2012-02-15 ENCOUNTER — Ambulatory Visit (INDEPENDENT_AMBULATORY_CARE_PROVIDER_SITE_OTHER): Payer: Medicare Other | Admitting: *Deleted

## 2012-02-15 VITALS — BP 130/78 | HR 60 | Ht 63.0 in | Wt 194.4 lb

## 2012-02-15 DIAGNOSIS — I6529 Occlusion and stenosis of unspecified carotid artery: Secondary | ICD-10-CM

## 2012-02-15 DIAGNOSIS — Z7901 Long term (current) use of anticoagulants: Secondary | ICD-10-CM | POA: Diagnosis not present

## 2012-02-15 DIAGNOSIS — R5383 Other fatigue: Secondary | ICD-10-CM | POA: Diagnosis not present

## 2012-02-15 DIAGNOSIS — R5381 Other malaise: Secondary | ICD-10-CM | POA: Diagnosis not present

## 2012-02-15 DIAGNOSIS — I4891 Unspecified atrial fibrillation: Secondary | ICD-10-CM | POA: Diagnosis not present

## 2012-02-15 LAB — BASIC METABOLIC PANEL
BUN: 20 mg/dL (ref 6–23)
CO2: 24 mEq/L (ref 19–32)
Chloride: 106 mEq/L (ref 96–112)
Potassium: 4.2 mEq/L (ref 3.5–5.1)

## 2012-02-15 LAB — CBC WITH DIFFERENTIAL/PLATELET
Basophils Relative: 0.8 % (ref 0.0–3.0)
Eosinophils Relative: 3 % (ref 0.0–5.0)
Lymphocytes Relative: 36.1 % (ref 12.0–46.0)
Monocytes Absolute: 0.6 10*3/uL (ref 0.1–1.0)
Monocytes Relative: 8.2 % (ref 3.0–12.0)
Neutrophils Relative %: 51.9 % (ref 43.0–77.0)
Platelets: 246 10*3/uL (ref 150.0–400.0)
RBC: 4.45 Mil/uL (ref 3.87–5.11)
WBC: 6.8 10*3/uL (ref 4.5–10.5)

## 2012-02-15 LAB — HEPATIC FUNCTION PANEL
Bilirubin, Direct: 0 mg/dL (ref 0.0–0.3)
Total Protein: 6.8 g/dL (ref 6.0–8.3)

## 2012-02-15 LAB — POCT INR: INR: 1.9

## 2012-02-15 LAB — TSH: TSH: 0.96 u[IU]/mL (ref 0.35–5.50)

## 2012-02-15 NOTE — Assessment & Plan Note (Signed)
She's had recent worsening fatigue. We'll check lab work today including CBC, chemistries, and TSH.

## 2012-02-15 NOTE — Progress Notes (Signed)
History of Present Illness: [name] is seen today to establish cardiac care. She is a former patient of Dr. Gala Romney. She has a history of paroxysmal atrial fibrillation for the past 6 years. She has been managed with Coumadin and Rythmol. She had extensive evaluation this past year including an echocardiogram and a stress echo. These studies were normal. She had mild left atrial enlargement. She also had recent carotid Doppler studies which demonstrated less than 40% disease bilaterally. On followup today she does report occasional palpitations typically at night. These last a few minutes. This does feel like her a 2 fibrillation. She states it hasn't been bad enough to where. Lately she has felt extremely tired and thinks she may have had a viral illness. She notices some mild shortness of breath.  Past Medical History  Diagnosis Date  . Paroxysmal atrial fibrillation   . Diverticulosis   . Hiatal hernia   . Hyperlipidemia   . Hypertension   . Chronic insomnia   . Sleep apnea   . Restless leg syndrome   . Hx of adenomatous colonic polyps   . Internal hemorrhoids   . Carotid bruit   . Fatty liver   . Colon polyps   . Pure hypercholesterolemia   . Special screening for malignant neoplasms, colon   . Routine general medical examination at a health care facility   . Other malaise and fatigue   . Screening for lipoid disorders   . Other acute sinusitis   . Acute upper respiratory infections of unspecified site     Current Outpatient Prescriptions  Medication Sig Dispense Refill  . amLODipine (NORVASC) 5 MG tablet TAKE ONE TABLET BY MOUTH DAILY  30 tablet  2  . hydrocortisone (ANUSOL-HC) 25 MG suppository as needed. Use 1 suppository at bedtime       . losartan (COZAAR) 100 MG tablet TAKE 1 TABLET BY MOUTH EVERY DAY  30 tablet  11  . metoprolol (TOPROL-XL) 50 MG 24 hr tablet TAKE 1 TABLET BY MOUTH ONCE A DAY  30 tablet  6  . omeprazole (PRILOSEC) 40 MG capsule Take 40 mg by  mouth as needed.        . propafenone (RYTHMOL) 225 MG tablet TAKE 1 TABLET BY MOUTH TWICE DAILY  60 tablet  5  . warfarin (COUMADIN) 5 MG tablet TAKE AS DIRECTED BY ANTICOAGULATION CLINIC  30 tablet  2  . zolpidem (AMBIEN) 10 MG tablet Take 10 mg by mouth at bedtime as needed.        Marland Kitchen DISCONTD: omeprazole (PRILOSEC) 40 MG capsule Take 1 capsule (40 mg total) by mouth daily.  30 capsule  3    Allergies: Allergies  Allergen Reactions  . Codeine     REACTION: vomiting    Vital Signs: BP 130/78  Pulse 60  Ht 5\' 3"  (1.6 m)  Wt 194 lb 6.4 oz (88.179 kg)  BMI 34.44 kg/m2  PHYSICAL EXAM: Well nourished, well developed, in no acute distress HEENT: normal Neck: no JVD or bruits Cardiac:  normal S1, S2; RRR; no murmur Lungs:  clear to auscultation bilaterally, no wheezing, rhonchi or rales Abd: soft, nontender, no hepatomegaly Ext: no edema Skin: warm and dry Neuro:  CNs 2-12 intact, no focal abnormalities noted  EKG:    ASSESSMENT AND PLAN:

## 2012-02-15 NOTE — Progress Notes (Signed)
Addended by: Tonita Phoenix on: 02/15/2012 10:57 AM   Modules accepted: Orders

## 2012-02-15 NOTE — Assessment & Plan Note (Signed)
She has chronic, paroxysmal atrial fibrillation. This has been well-controlled with Rythmol. She does have some mild breakthrough symptoms. [place] score is 1 and I recommended long-term anticoagulation. We discussed alternative options to Coumadin. She has done well on Coumadin and this is very inexpensive for her so she would like to continue on her current therapy. Will follow up again in 6 months we will repeat ECG at that time.

## 2012-02-15 NOTE — Patient Instructions (Signed)
We will check lab work on you today.  Continue your current medication  I will see you again in 6 months.

## 2012-02-15 NOTE — Assessment & Plan Note (Signed)
She has nonobstructive carotid disease. Will continue risk factor modification.

## 2012-03-03 ENCOUNTER — Emergency Department (HOSPITAL_COMMUNITY)
Admission: EM | Admit: 2012-03-03 | Discharge: 2012-03-03 | Disposition: A | Discharge status: Home / Self Care | Payer: Medicare Other | Attending: Emergency Medicine | Admitting: Emergency Medicine

## 2012-03-03 ENCOUNTER — Encounter (HOSPITAL_COMMUNITY): Payer: Self-pay | Admitting: Emergency Medicine

## 2012-03-03 ENCOUNTER — Emergency Department (HOSPITAL_COMMUNITY): Payer: Medicare Other

## 2012-03-03 DIAGNOSIS — E785 Hyperlipidemia, unspecified: Secondary | ICD-10-CM | POA: Diagnosis not present

## 2012-03-03 DIAGNOSIS — I10 Essential (primary) hypertension: Secondary | ICD-10-CM | POA: Insufficient documentation

## 2012-03-03 DIAGNOSIS — M1710 Unilateral primary osteoarthritis, unspecified knee: Secondary | ICD-10-CM | POA: Diagnosis not present

## 2012-03-03 DIAGNOSIS — Z860100 Personal history of colon polyps, unspecified: Secondary | ICD-10-CM | POA: Insufficient documentation

## 2012-03-03 DIAGNOSIS — M25569 Pain in unspecified knee: Secondary | ICD-10-CM | POA: Diagnosis not present

## 2012-03-03 DIAGNOSIS — I4891 Unspecified atrial fibrillation: Secondary | ICD-10-CM | POA: Diagnosis not present

## 2012-03-03 NOTE — ED Provider Notes (Signed)
Medical screening examination/treatment/procedure(s) were performed by non-physician practitioner and as supervising physician I was immediately available for consultation/collaboration.   Carleene Cooper III, MD 03/03/12 2105

## 2012-03-03 NOTE — ED Provider Notes (Signed)
History     CSN: [id number]  Arrival date & time 03/03/12  7829   First MD Initiated Contact with Patient 03/03/12 1008      Chief Complaint  Patient presents with  . Knee Pain    (Consider location/radiation/quality/duration/timing/severity/associated sxs/prior treatment) Patient is a 66 y.o. female presenting with knee pain.  Knee Pain This is a chronic problem. Episode onset: started 4 months ago. The problem occurs intermittently. The problem has been waxing and waning. Associated symptoms include joint swelling. Pertinent negatives include no abdominal pain, anorexia, arthralgias, change in bowel habit, chest pain, chills, congestion, coughing, diaphoresis, fatigue, fever, headaches, myalgias, nausea, neck pain, numbness, rash, sore throat, swollen glands, urinary symptoms, vertigo, visual change, vomiting or weakness. The symptoms are aggravated by walking. She has tried NSAIDs for the symptoms. The treatment provided mild relief.   Pt is currently on Coumadin for atrial fibrillation.  Recent long travel in January to [place], [place] and more recently to [facility] which was less than 5 hours.  No recent surgical h/o.  No h/o DVT.  Past Medical History  Diagnosis Date  . Paroxysmal atrial fibrillation   . Diverticulosis   . Hiatal hernia   . Hyperlipidemia   . Hypertension   . Chronic insomnia   . Sleep apnea   . Restless leg syndrome   . Hx of adenomatous colonic polyps   . Internal hemorrhoids   . Carotid bruit   . Fatty liver   . Colon polyps   . Pure hypercholesterolemia   . Special screening for malignant neoplasms, colon   . Routine general medical examination at a health care facility   . Other malaise and fatigue   . Screening for lipoid disorders   . Other acute sinusitis   . Acute upper respiratory infections of unspecified site     Past Surgical History  Procedure Date  . Tubal ligation   . Cholecystectomy   . Cardiovascular stress mri 12/20/06   afib  . Orif- l distal radius 9/07  . Cardiolyte neg 6/06    Family History  Problem Relation Age of Onset  . Breast cancer Mother   . Heart disease Father   . Diabetes      cousins  . Colon cancer Neg Hx     History  Substance Use Topics  . Smoking status: Never Smoker   . Smokeless tobacco: Never Used  . Alcohol Use: 0.6 oz/week    1 Glasses of wine per week     occasional    OB History    Grav Para Term Preterm Abortions TAB SAB Ect Mult Living                  Review of Systems  Constitutional: Negative for fever, chills, diaphoresis and fatigue.  HENT: Negative for congestion, sore throat and neck pain.   Respiratory: Negative for cough.   Cardiovascular: Negative for chest pain.  Gastrointestinal: Negative for nausea, vomiting, abdominal pain, anorexia and change in bowel habit.  Musculoskeletal: Positive for joint swelling. Negative for myalgias and arthralgias.  Skin: Negative for rash.  Neurological: Negative for vertigo, weakness, numbness and headaches.  All other systems reviewed and are negative.    Allergies  Amoxicillin and Codeine  Home Medications   Current Outpatient Rx  Name Route Sig Dispense Refill  . DIPHENHYDRAMINE-APAP (SLEEP) 25-500 MG PO TABS Oral Take 2 tablets by mouth at bedtime as needed. For pain and sleep.    Marland Kitchen LOSARTAN POTASSIUM  100 MG PO TABS  TAKE 1 TABLET BY MOUTH EVERY DAY 30 tablet 11  . METOPROLOL SUCCINATE ER 50 MG PO TB24  TAKE 1 TABLET BY MOUTH ONCE A DAY 30 tablet 6  . PROPAFENONE HCL 225 MG PO TABS  TAKE 1 TABLET BY MOUTH TWICE DAILY 60 tablet 5  . WARFARIN SODIUM 5 MG PO TABS Oral Take 2.5 mg by mouth daily.    Marland Kitchen ZOLPIDEM TARTRATE 10 MG PO TABS Oral Take 10 mg by mouth at bedtime as needed. For sleep    . WARFARIN SODIUM 5 MG PO TABS        BP 153/78  Pulse 68  Temp(Src) 98.3 F (36.8 C) (Oral)  Resp 16  SpO2 98%  Physical Exam  Nursing note and vitals reviewed. Constitutional: She is oriented to person,  place, and time. She appears well-developed and well-nourished. No distress.  HENT:  Head: Normocephalic and atraumatic.  Eyes: Conjunctivae and EOM are normal. Pupils are equal, round, and reactive to light.  Neck: Normal range of motion. Neck supple.  Cardiovascular: Normal rate, regular rhythm and normal heart sounds.   Musculoskeletal: Normal range of motion.       Left knee: She exhibits swelling. She exhibits no effusion, no ecchymosis, no deformity, no laceration, no erythema and no bony tenderness. No medial joint line, no lateral joint line, no MCL, no LCL and no patellar tendon tenderness noted.       Legs: Neurological: She is alert and oriented to person, place, and time.  Skin: Skin is warm and dry. No rash noted. She is not diaphoretic. No erythema.  Psychiatric: She has a normal mood and affect. Her behavior is normal.    ED Course  Procedures (including critical care time)  Labs Reviewed - No data to display Dg Knee Complete 4 Views Left  03/03/2012  *RADIOLOGY REPORT*  Clinical Data: Knee pain  LEFT KNEE - COMPLETE 4+ VIEW  Comparison: None  Findings: There is no evidence of fracture or dislocation.  There is no evidence of arthropathy or other focal bone abnormality. Soft tissues are unremarkable.  IMPRESSION:  Negative exam.  Original Report Authenticated By: Rosealee Albee, M.D.     1. Knee osteoarthritis       MDM  Knee pain likely osteoarthritis in nature.  Advised to continue taking Aleve or NSAIDs.  If pain continues, pt needs to f/u in the ED.  Wells criteria reveals low probability.  If sx worsen, advised to return to ED.  Pt and husband voice understanding.        Lindley Magnus [place], [place] 03/03/12 1059

## 2012-03-03 NOTE — ED Notes (Signed)
Pt. Stated, i've had some pain behind my left knee for several days to the point at one time I couldn't hardly walk

## 2012-03-03 NOTE — Discharge Instructions (Signed)
Knee Pain The knee is the complex joint between your thigh and your lower leg. It is made up of bones, tendons, ligaments, and cartilage. The bones that make up the knee are:  The femur in the thigh.   The tibia and fibula in the lower leg.   The patella or kneecap riding in the groove on the lower femur.  CAUSES  Knee pain is a common complaint with many causes. A few of these causes are:  Injury, such as:   A ruptured ligament or tendon injury.   Torn cartilage.   Medical conditions, such as:   Gout   Arthritis   Infections   Overuse, over training or overdoing a physical activity.  Knee pain can be minor or severe. Knee pain can accompany debilitating injury. Minor knee problems often respond well to self-care measures or get well on their own. More serious injuries may need medical intervention or even surgery. SYMPTOMS The knee is complex. Symptoms of knee problems can vary widely. Some of the problems are:  Pain with movement and weight bearing.   Swelling and tenderness.   Buckling of the knee.   Inability to straighten or extend your knee.   Your knee locks and you cannot straighten it.   Warmth and redness with pain and fever.   Deformity or dislocation of the kneecap.  DIAGNOSIS  Determining what is wrong may be very straight forward such as when there is an injury. It can also be challenging because of the complexity of the knee. Tests to make a diagnosis may include:  Your caregiver taking a history and doing a physical exam.   Routine X-rays can be used to rule out other problems. X-rays will not reveal a cartilage tear. Some injuries of the knee can be diagnosed by:   Arthroscopy a surgical technique by which a small video camera is inserted through tiny incisions on the sides of the knee. This procedure is used to examine and repair internal knee joint problems. Tiny instruments can be used during arthroscopy to repair the torn knee cartilage  (meniscus).   Arthrography is a radiology technique. A contrast liquid is directly injected into the knee joint. Internal structures of the knee joint then become visible on X-ray film.   An MRI scan is a non x-ray radiology procedure in which magnetic fields and a computer produce two- or three-dimensional images of the inside of the knee. Cartilage tears are often visible using an MRI scanner. MRI scans have largely replaced arthrography in diagnosing cartilage tears of the knee.   Blood work.   Examination of the fluid that helps to lubricate the knee joint (synovial fluid). This is done by taking a sample out using a needle and a syringe.  TREATMENT The treatment of knee problems depends on the cause. Some of these treatments are:  Depending on the injury, proper casting, splinting, surgery or physical therapy care will be needed.   Give yourself adequate recovery time. Do not overuse your joints. If you begin to get sore during workout routines, back off. Slow down or do fewer repetitions.   For repetitive activities such as cycling or running, maintain your strength and nutrition.   Alternate muscle groups. For example if you are a weight lifter, work the upper body on one day and the lower body the next.   Either tight or weak muscles do not give the proper support for your knee. Tight or weak muscles do not absorb the stress placed   on the knee joint. Keep the muscles surrounding the knee strong.   Take care of mechanical problems.   If you have flat feet, orthotics or special shoes may help. See your caregiver if you need help.   Arch supports, sometimes with wedges on the inner or outer aspect of the heel, can help. These can shift pressure away from the side of the knee most bothered by osteoarthritis.   A brace called an "unloader" brace also may be used to help ease the pressure on the most arthritic side of the knee.   If your caregiver has prescribed crutches, braces,  wraps or ice, use as directed. The acronym for this is PRICE. This means protection, rest, ice, compression and elevation.   Nonsteroidal anti-inflammatory drugs (NSAID's), can help relieve pain. But if taken immediately after an injury, they may actually increase swelling. Take NSAID's with food in your stomach. Stop them if you develop stomach problems. Do not take these if you have a history of ulcers, stomach pain or bleeding from the bowel. Do not take without your caregiver's approval if you have problems with fluid retention, heart failure, or kidney problems.   For ongoing knee problems, physical therapy may be helpful.   Glucosamine and chondroitin are over-the-counter dietary supplements. Both may help relieve the pain of osteoarthritis in the knee. These medicines are different from the usual anti-inflammatory drugs. Glucosamine may decrease the rate of cartilage destruction.   Injections of a corticosteroid drug into your knee joint may help reduce the symptoms of an arthritis flare-up. They may provide pain relief that lasts a few months. You may have to wait a few months between injections. The injections do have a small increased risk of infection, water retention and elevated blood sugar levels.   Hyaluronic acid injected into damaged joints may ease pain and provide lubrication. These injections may work by reducing inflammation. A series of shots may give relief for as long as 6 months.   Topical painkillers. Applying certain ointments to your skin may help relieve the pain and stiffness of osteoarthritis. Ask your pharmacist for suggestions. Many over the-counter products are approved for temporary relief of arthritis pain.   In some countries, doctors often prescribe topical NSAID's for relief of chronic conditions such as arthritis and tendinitis. A review of treatment with NSAID creams found that they worked as well as oral medications but without the serious side effects.    PREVENTION  Maintain a healthy weight. Extra pounds put more strain on your joints.   Get strong, stay limber. Weak muscles are a common cause of knee injuries. Stretching is important. Include flexibility exercises in your workouts.   Be smart about exercise. If you have osteoarthritis, chronic knee pain or recurring injuries, you may need to change the way you exercise. This does not mean you have to stop being active. If your knees ache after jogging or playing basketball, consider switching to swimming, water aerobics or other low-impact activities, at least for a few days a week. Sometimes limiting high-impact activities will provide relief.   Make sure your shoes fit well. Choose footwear that is right for your sport.   Protect your knees. Use the proper gear for knee-sensitive activities. Use kneepads when playing volleyball or laying carpet. Buckle your seat belt every time you drive. Most shattered kneecaps occur in car accidents.   Rest when you are tired.  SEEK MEDICAL CARE IF:  You have knee pain that is continual and does not   seem to be getting better.  SEEK IMMEDIATE MEDICAL CARE IF:  Your knee joint feels hot to the touch and you have a high fever. MAKE SURE YOU:   Understand these instructions.   Will watch your condition.   Will get help right away if you are not doing well or get worse.  Document Released: 08/21/2007 Document Revised: 10/13/2011 Document Reviewed: 08/21/2007 [employer] [facility] [employer] Patient Information 2012 [place], [place].Arthritis, Nonspecific Arthritis is inflammation of a joint. This usually means pain, redness, warmth or swelling are present. One or more joints may be involved. There are a number of types of arthritis. Your caregiver may not be able to tell what type of arthritis you have right away. CAUSES  The most common cause of arthritis is the wear and tear on the joint (osteoarthritis). This causes damage to the cartilage, which can break down over time.  The knees, hips, back and neck are most often affected by this type of arthritis. Other types of arthritis and common causes of joint pain include:  Sprains and other injuries near the joint. Sometimes minor sprains and injuries cause pain and swelling that develop hours later.   Rheumatoid arthritis. This affects hands, feet and knees. It usually affects both sides of your body at the same time. It is often associated with chronic ailments, fever, weight loss and general weakness.   Crystal arthritis. Gout and pseudo gout can cause occasional acute severe pain, redness and swelling in the foot, ankle, or knee.   Infectious arthritis. Bacteria can get into a joint through a break in overlying skin. This can cause infection of the joint. Bacteria and viruses can also spread through the blood and affect your joints.   Drug, infectious and allergy reactions. Sometimes joints can become mildly painful and slightly swollen with these types of illnesses.  SYMPTOMS   Pain is the main symptom.   Your joint or joints can also be red, swollen and warm or hot to the touch.   You may have a fever with certain types of arthritis, or even feel overall ill.   The joint with arthritis will hurt with movement. Stiffness is present with some types of arthritis.  DIAGNOSIS  Your caregiver will suspect arthritis based on your description of your symptoms and on your exam. Testing may be needed to find the type of arthritis:  Blood and sometimes urine tests.   X-ray tests and sometimes CT or MRI scans.   Removal of fluid from the joint (arthrocentesis) is done to check for bacteria, crystals or other causes. Your caregiver (or a specialist) will numb the area over the joint with a local anesthetic, and use a needle to remove joint fluid for examination. This procedure is only minimally uncomfortable.   Even with these tests, your caregiver may not be able to tell what kind of arthritis you have.  Consultation with a specialist (rheumatologist) may be helpful.  TREATMENT  Your caregiver will discuss with you treatment specific to your type of arthritis. If the specific type cannot be determined, then the following general recommendations may apply. Treatment of severe joint pain includes:  Rest.   Elevation.   Anti-inflammatory medication (for example, ibuprofen) may be prescribed. Avoiding activities that cause increased pain.   Only take over-the-counter or prescription medicines for pain and discomfort as recommended by your caregiver.   Cold packs over an inflamed joint may be used for 10 to 15 minutes every hour. Hot packs sometimes feel better, but do not use  overnight. Do not use hot packs if you are diabetic without your caregiver's permission.   A cortisone shot into arthritic joints may help reduce pain and swelling.   Any acute arthritis that gets worse over the next 1 to 2 days needs to be looked at to be sure there is no joint infection.  Long-term arthritis treatment involves modifying activities and lifestyle to reduce joint stress jarring. This can include weight loss. Also, exercise is needed to nourish the joint cartilage and remove waste. This helps keep the muscles around the joint strong. HOME CARE INSTRUCTIONS   Do not take aspirin to relieve pain if gout is suspected. This elevates uric acid levels.   Only take over-the-counter or prescription medicines for pain, discomfort or fever as directed by your caregiver.   Rest the joint as much as possible.   If your joint is swollen, keep it elevated.   Use crutches if the painful joint is in your leg.   Drinking plenty of fluids may help for certain types of arthritis.   Follow your caregiver's dietary instructions.   Try low-impact exercise such as:   Swimming.   Water aerobics.   Biking.   Walking.   Morning stiffness is often relieved by a warm shower.   Put your joints through regular  range-of-motion.  SEEK MEDICAL CARE IF:   You do not feel better in 24 hours or are getting worse.   You have side effects to medications, or are not getting better with treatment.  SEEK IMMEDIATE MEDICAL CARE IF:   You have a fever.   You develop severe joint pain, swelling or redness.   Many joints are involved and become painful and swollen.   There is severe back pain and/or leg weakness.   You have loss of bowel or bladder control.  Document Released: 12/01/2004 Document Revised: 10/13/2011 Document Reviewed: 12/17/2008 [facility] Patient Information 2012 [place], [place].

## 2012-03-05 DIAGNOSIS — M25569 Pain in unspecified knee: Secondary | ICD-10-CM | POA: Diagnosis not present

## 2012-03-07 ENCOUNTER — Ambulatory Visit (INDEPENDENT_AMBULATORY_CARE_PROVIDER_SITE_OTHER): Payer: Medicare Other | Admitting: Pharmacist

## 2012-03-07 DIAGNOSIS — I4891 Unspecified atrial fibrillation: Secondary | ICD-10-CM

## 2012-03-07 LAB — POCT INR: INR: 1.9

## 2012-03-13 DIAGNOSIS — M25569 Pain in unspecified knee: Secondary | ICD-10-CM | POA: Diagnosis not present

## 2012-03-21 ENCOUNTER — Ambulatory Visit (INDEPENDENT_AMBULATORY_CARE_PROVIDER_SITE_OTHER): Payer: Medicare Other | Admitting: Pharmacist

## 2012-03-21 DIAGNOSIS — I4891 Unspecified atrial fibrillation: Secondary | ICD-10-CM

## 2012-03-21 LAB — POCT INR: INR: 1.9

## 2012-03-27 ENCOUNTER — Other Ambulatory Visit: Payer: Self-pay | Admitting: Pharmacist

## 2012-03-27 MED ORDER — WARFARIN SODIUM 5 MG PO TABS: ORAL_TABLET | ORAL | Status: DC

## 2012-04-07 DIAGNOSIS — M1710 Unilateral primary osteoarthritis, unspecified knee: Secondary | ICD-10-CM | POA: Diagnosis not present

## 2012-04-10 ENCOUNTER — Ambulatory Visit (INDEPENDENT_AMBULATORY_CARE_PROVIDER_SITE_OTHER): Payer: Medicare Other | Admitting: Pharmacist

## 2012-04-10 DIAGNOSIS — M23329 Other meniscus derangements, posterior horn of medial meniscus, unspecified knee: Secondary | ICD-10-CM | POA: Diagnosis not present

## 2012-04-10 DIAGNOSIS — I4891 Unspecified atrial fibrillation: Secondary | ICD-10-CM | POA: Diagnosis not present

## 2012-04-10 LAB — POCT INR: INR: 3.1

## 2012-04-16 ENCOUNTER — Other Ambulatory Visit (HOSPITAL_COMMUNITY): Payer: Self-pay | Admitting: Adult Health

## 2012-04-16 MED ORDER — METOPROLOL SUCCINATE ER 50 MG PO TB24: 50.0000 mg | ORAL_TABLET | Freq: Every day | ORAL | Status: DC

## 2012-04-26 DIAGNOSIS — M23329 Other meniscus derangements, posterior horn of medial meniscus, unspecified knee: Secondary | ICD-10-CM | POA: Diagnosis not present

## 2012-04-26 DIAGNOSIS — M23305 Other meniscus derangements, unspecified medial meniscus, unspecified knee: Secondary | ICD-10-CM | POA: Diagnosis not present

## 2012-04-26 DIAGNOSIS — M9420 Chondromalacia, unspecified site: Secondary | ICD-10-CM | POA: Diagnosis not present

## 2012-05-28 ENCOUNTER — Other Ambulatory Visit (HOSPITAL_COMMUNITY): Payer: Self-pay

## 2012-05-30 MED ORDER — METOPROLOL SUCCINATE ER 50 MG PO TB24: 50.0000 mg | ORAL_TABLET | Freq: Every day | ORAL | Status: DC

## 2012-05-31 ENCOUNTER — Encounter (INDEPENDENT_AMBULATORY_CARE_PROVIDER_SITE_OTHER): Payer: Medicare Other

## 2012-05-31 DIAGNOSIS — I4891 Unspecified atrial fibrillation: Secondary | ICD-10-CM

## 2012-05-31 DIAGNOSIS — M25569 Pain in unspecified knee: Secondary | ICD-10-CM | POA: Diagnosis not present

## 2012-05-31 DIAGNOSIS — M2240 Chondromalacia patellae, unspecified knee: Secondary | ICD-10-CM | POA: Diagnosis not present

## 2012-05-31 DIAGNOSIS — M23329 Other meniscus derangements, posterior horn of medial meniscus, unspecified knee: Secondary | ICD-10-CM | POA: Diagnosis not present

## 2012-05-31 DIAGNOSIS — Z4789 Encounter for other orthopedic aftercare: Secondary | ICD-10-CM | POA: Diagnosis not present

## 2012-05-31 LAB — POCT INR: INR: 2.7

## 2012-06-21 ENCOUNTER — Other Ambulatory Visit (HOSPITAL_COMMUNITY): Payer: Self-pay | Admitting: *Deleted

## 2012-06-21 MED ORDER — PROPAFENONE HCL 225 MG PO TABS: 225.0000 mg | ORAL_TABLET | Freq: Two times a day (BID) | ORAL | Status: DC

## 2012-09-12 ENCOUNTER — Encounter: Payer: Self-pay | Admitting: Cardiology

## 2012-09-12 ENCOUNTER — Ambulatory Visit (INDEPENDENT_AMBULATORY_CARE_PROVIDER_SITE_OTHER): Payer: Medicare Other | Admitting: Cardiology

## 2012-09-12 ENCOUNTER — Ambulatory Visit (INDEPENDENT_AMBULATORY_CARE_PROVIDER_SITE_OTHER): Payer: Medicare Other | Admitting: *Deleted

## 2012-09-12 VITALS — BP 120/70 | HR 64 | Ht 63.0 in | Wt 178.0 lb

## 2012-09-12 DIAGNOSIS — I10 Essential (primary) hypertension: Secondary | ICD-10-CM | POA: Diagnosis not present

## 2012-09-12 DIAGNOSIS — R5381 Other malaise: Secondary | ICD-10-CM | POA: Diagnosis not present

## 2012-09-12 DIAGNOSIS — Z7901 Long term (current) use of anticoagulants: Secondary | ICD-10-CM

## 2012-09-12 DIAGNOSIS — R5383 Other fatigue: Secondary | ICD-10-CM

## 2012-09-12 DIAGNOSIS — I4891 Unspecified atrial fibrillation: Secondary | ICD-10-CM

## 2012-09-12 LAB — POCT INR: INR: 2.1

## 2012-09-12 NOTE — Patient Instructions (Addendum)
Continue your current medication.  Reduce Losartan to 50 mg daily.  I will see you again in 6 months.

## 2012-09-12 NOTE — Progress Notes (Signed)
History of Present Illness: [name] is seen today to followup on her atrial fibrillation. She has done well since her last visit. She underwent arthroscopic surgery on her left knee in May. She does have occasional episodes of palpitations in the evening when she is tired. Typically these will last less than 30 minutes. This is unchanged from her prior pattern. She does note that there are times when her blood pressure gets down low even into the 80s systolic. These episodes are associated with significant fatigue.  Past Medical History  Diagnosis Date  . Paroxysmal atrial fibrillation   . Diverticulosis   . Hiatal hernia   . Hyperlipidemia   . Hypertension   . Chronic insomnia   . Sleep apnea   . Restless leg syndrome   . Hx of adenomatous colonic polyps   . Internal hemorrhoids   . Carotid bruit   . Fatty liver   . Colon polyps   . Pure hypercholesterolemia   . Special screening for malignant neoplasms, colon   . Routine general medical examination at a health care facility   . Other malaise and fatigue   . Screening for lipoid disorders   . Other acute sinusitis   . Acute upper respiratory infections of unspecified site     Current Outpatient Prescriptions  Medication Sig Dispense Refill  . losartan (COZAAR) 100 MG tablet TAKE 1 TABLET BY MOUTH EVERY DAY  30 tablet  11  . metoprolol succinate (TOPROL-XL) 50 MG 24 hr tablet Take 1 tablet (50 mg total) by mouth daily. Take with or immediately following a meal.  30 tablet  6  . propafenone (RYTHMOL) 225 MG tablet Take 1 tablet (225 mg total) by mouth 2 (two) times daily.  60 tablet  5  . warfarin (COUMADIN) 5 MG tablet Take as directed by Anticoagulation clinic  30 tablet  2  . zolpidem (AMBIEN) 10 MG tablet Take 10 mg by mouth at bedtime as needed. For sleep      . diphenhydramine-acetaminophen (TYLENOL PM) 25-500 MG TABS Take 2 tablets by mouth at bedtime as needed. For pain and sleep.      Marland Kitchen warfarin (COUMADIN) 5 MG  tablet         Allergies: Allergies  Allergen Reactions  . Amoxicillin Other (See Comments)    blister  . Codeine     REACTION: vomiting   Review of systems: As noted in history of present illness. All other review of systems are reviewed and are negative.  Vital Signs: BP 120/70  Pulse 64  Ht 5\' 3"  (1.6 m)  Wt 80.74 kg (178 lb)  BMI 31.53 kg/m2  SpO2 97%  PHYSICAL EXAM: Well nourished, well developed, in no acute distress HEENT: normal Neck: no JVD or bruits Cardiac:  normal S1, S2; RRR; no murmur Lungs:  clear to auscultation bilaterally, no wheezing, rhonchi or rales Abd: soft, nontender, no hepatomegaly Ext: no edema Skin: warm and dry Neuro:  CNs 2-12 intact, no focal abnormalities noted  ASSESSMENT AND PLAN:  1. Paroxysmal atrial fibrillation. This appears to be reasonably well controlled on Rythmol. She is on chronic anticoagulation with Coumadin. We will continue her current therapy.  2. Hypertension. Patient is having episodes of low blood pressure. I recommended reducing her losartan to 50 mg per day. We'll followup again in 6 months.

## 2012-10-10 ENCOUNTER — Ambulatory Visit (INDEPENDENT_AMBULATORY_CARE_PROVIDER_SITE_OTHER): Payer: Medicare Other | Admitting: Pharmacist

## 2012-10-10 DIAGNOSIS — I4891 Unspecified atrial fibrillation: Secondary | ICD-10-CM

## 2012-11-13 ENCOUNTER — Ambulatory Visit (INDEPENDENT_AMBULATORY_CARE_PROVIDER_SITE_OTHER): Payer: Medicare Other | Admitting: *Deleted

## 2012-11-13 DIAGNOSIS — I4891 Unspecified atrial fibrillation: Secondary | ICD-10-CM

## 2012-12-11 ENCOUNTER — Ambulatory Visit (INDEPENDENT_AMBULATORY_CARE_PROVIDER_SITE_OTHER): Payer: Medicare Other | Admitting: *Deleted

## 2012-12-11 DIAGNOSIS — I4891 Unspecified atrial fibrillation: Secondary | ICD-10-CM | POA: Diagnosis not present

## 2012-12-11 LAB — POCT INR: INR: 2.5

## 2012-12-16 ENCOUNTER — Other Ambulatory Visit (HOSPITAL_COMMUNITY): Payer: Self-pay | Admitting: Internal Medicine

## 2012-12-17 ENCOUNTER — Telehealth: Payer: Self-pay | Admitting: Cardiology

## 2012-12-17 MED ORDER — METOPROLOL SUCCINATE ER 25 MG PO TB24: ORAL_TABLET | ORAL | Status: DC

## 2012-12-17 NOTE — Addendum Note (Signed)
Addended by: Scherrie Bateman E on: 12/17/2012 03:48 PM   Modules accepted: Orders

## 2012-12-17 NOTE — Telephone Encounter (Signed)
Pt having problem with heart rate , this am pulse 54 about 9a

## 2012-12-17 NOTE — Telephone Encounter (Signed)
Can reduce metoprolol to 25 mg daily.  Peter Swaziland MD, [facility]

## 2012-12-17 NOTE — Telephone Encounter (Signed)
ATTEMPTED TO CALL PT MAIL BOX FULL UNABLE TO LEAVE MESSAGE  WILL TRY  TO PHONE PT LATER  RE MESSAGE .[name]

## 2012-12-17 NOTE — Telephone Encounter (Signed)
PT  AWARE./CY 

## 2012-12-17 NOTE — Telephone Encounter (Signed)
SPOKE WITH PT  COMPLAINING OF FATIGUE AND  HEART RATE RUNNING IN  THE 50'S FOR SEVERAL WEEKS  OCC RISING UP TO 60 B/P TODAY WAS  140/70  CURRENTLY TAKING METOPROLOL 50 MG  QD AND LOSARTAN 100 MG   WILL FORWARD TO DR Swaziland  TO REVIEW .Zack Seal

## 2013-01-15 ENCOUNTER — Ambulatory Visit (INDEPENDENT_AMBULATORY_CARE_PROVIDER_SITE_OTHER): Payer: Medicare Other

## 2013-01-15 ENCOUNTER — Other Ambulatory Visit: Payer: Self-pay

## 2013-01-15 DIAGNOSIS — I4891 Unspecified atrial fibrillation: Secondary | ICD-10-CM | POA: Diagnosis not present

## 2013-01-15 NOTE — Telephone Encounter (Signed)
According to last office visit Dr. Swaziland advised to reduce Losartan to 50 MG

## 2013-01-16 MED ORDER — LOSARTAN POTASSIUM 100 MG PO TABS: 100.0000 mg | ORAL_TABLET | Freq: Every day | ORAL | Status: DC

## 2013-01-16 NOTE — Telephone Encounter (Signed)
Called patient and she stated she takes 100 mg losartan but in the last office visit Dr. Swaziland said to take 1/2 tablet (50 mg).

## 2013-01-16 NOTE — Telephone Encounter (Signed)
Spoke with patient she stated she had to start back taking losartan 100 mg daily.States B/P was elevated and she has been taking losartan 100 mg daily for several months and B/P is normal.

## 2013-02-05 DIAGNOSIS — D235 Other benign neoplasm of skin of trunk: Secondary | ICD-10-CM | POA: Diagnosis not present

## 2013-02-05 DIAGNOSIS — L820 Inflamed seborrheic keratosis: Secondary | ICD-10-CM | POA: Diagnosis not present

## 2013-02-05 DIAGNOSIS — C44319 Basal cell carcinoma of skin of other parts of face: Secondary | ICD-10-CM | POA: Diagnosis not present

## 2013-02-06 ENCOUNTER — Ambulatory Visit: Payer: Medicare Other | Admitting: Cardiology

## 2013-02-28 ENCOUNTER — Ambulatory Visit (INDEPENDENT_AMBULATORY_CARE_PROVIDER_SITE_OTHER): Payer: Medicare Other | Admitting: *Deleted

## 2013-02-28 DIAGNOSIS — I4891 Unspecified atrial fibrillation: Secondary | ICD-10-CM

## 2013-02-28 LAB — POCT INR: INR: 1.7

## 2013-03-14 ENCOUNTER — Other Ambulatory Visit: Payer: Self-pay | Admitting: Neurology

## 2013-03-20 ENCOUNTER — Encounter: Payer: Self-pay | Admitting: Cardiology

## 2013-03-20 ENCOUNTER — Ambulatory Visit (INDEPENDENT_AMBULATORY_CARE_PROVIDER_SITE_OTHER): Payer: Medicare Other | Admitting: Cardiology

## 2013-03-20 ENCOUNTER — Ambulatory Visit (INDEPENDENT_AMBULATORY_CARE_PROVIDER_SITE_OTHER): Payer: Medicare Other | Admitting: *Deleted

## 2013-03-20 VITALS — BP 148/80 | HR 62 | Ht 63.0 in | Wt 189.6 lb

## 2013-03-20 DIAGNOSIS — E7800 Pure hypercholesterolemia, unspecified: Secondary | ICD-10-CM | POA: Diagnosis not present

## 2013-03-20 DIAGNOSIS — I4891 Unspecified atrial fibrillation: Secondary | ICD-10-CM | POA: Diagnosis not present

## 2013-03-20 DIAGNOSIS — I10 Essential (primary) hypertension: Secondary | ICD-10-CM | POA: Diagnosis not present

## 2013-03-20 MED ORDER — LOSARTAN POTASSIUM 100 MG PO TABS: 50.0000 mg | ORAL_TABLET | Freq: Every day | ORAL | Status: DC

## 2013-03-20 NOTE — Progress Notes (Signed)
History of Present Illness: [name] is seen today to followup on her atrial fibrillation. In general she has done well since her last visit. In February she noted her heart rate was down in the 40s. She stop taking her metoprolol and it came back up. She took amlodipine for blood pressure for a short while and then went back to the metoprolol. Since then her heart rate has been fine. She is really unsure about the dose of her losartan. She denies any symptoms of atrial fibrillation over the past 3 months.  Past Medical History  Diagnosis Date  . Paroxysmal atrial fibrillation   . Diverticulosis   . Hiatal hernia   . Hyperlipidemia   . Hypertension   . Chronic insomnia   . Sleep apnea   . Restless leg syndrome   . Hx of adenomatous colonic polyps   . Internal hemorrhoids   . Carotid bruit   . Fatty liver   . Colon polyps   . Pure hypercholesterolemia   . Special screening for malignant neoplasms, colon   . Routine general medical examination at a health care facility   . Other malaise and fatigue   . Screening for lipoid disorders   . Other acute sinusitis   . Acute upper respiratory infections of unspecified site     Current Outpatient Prescriptions  Medication Sig Dispense Refill  . losartan (COZAAR) 100 MG tablet Take 0.5 tablets (50 mg total) by mouth daily.  30 tablet  6  . metoprolol succinate (TOPROL-XL) 25 MG 24 hr tablet Take with or immediately following a meal.  30 tablet  6  . propafenone (RYTHMOL) 225 MG tablet TAKE 1 TABLET (225 MG TOTAL) BY MOUTH 2 (TWO) TIMES DAILY.  60 tablet  5  . topiramate (TOPAMAX) 25 MG tablet TAKE ONE TABLET BY MOUTH IN THE MORNING  90 tablet  0  . warfarin (COUMADIN) 5 MG tablet Take as directed by Anticoagulation clinic  30 tablet  2  . zolpidem (AMBIEN) 10 MG tablet Take 10 mg by mouth at bedtime as needed. For sleep       No current facility-administered medications for this visit.    Allergies: Allergies  Allergen  Reactions  . Amoxicillin Other (See Comments)    blister  . Codeine     REACTION: vomiting   Review of systems: As noted in history of present illness. All other review of systems are reviewed and are negative.  Vital Signs: BP 148/80  Pulse 62  Ht 5\' 3"  (1.6 m)  Wt 189 lb 9.6 oz (86.002 kg)  BMI 33.59 kg/m2  SpO2 99%  PHYSICAL EXAM: Well nourished, well developed, in no acute distress HEENT: normal Neck: no JVD or bruits Cardiac:  normal S1, S2; RRR; no murmur Lungs:  clear to auscultation bilaterally, no wheezing, rhonchi or rales Abd: soft, nontender, no hepatomegaly Ext: no edema Skin: warm and dry Neuro:  CNs 2-12 intact, no focal abnormalities noted  Laboratory data: ECG today demonstrates sinus rhythm with a rate of 58 beats per minute. It is otherwise normal.  ASSESSMENT AND PLAN:  1. Paroxysmal atrial fibrillation. This appears to be  well controlled on Rythmol. She is on chronic anticoagulation with Coumadin. We will continue her current therapy.  2. Hypertension. Blood pressure is under fairly good control today. We will continue with metoprolol and losartan. She needs to verify the dose of losartan. If she were to have recurrent episodes of bradycardia may need to stop her  beta blocker and use amlodipine instead.

## 2013-03-20 NOTE — Patient Instructions (Signed)
Continue your current therapy. Call if your heart rate stays slow.  I will see you in 6 months.

## 2013-04-22 ENCOUNTER — Ambulatory Visit (INDEPENDENT_AMBULATORY_CARE_PROVIDER_SITE_OTHER): Payer: Medicare Other

## 2013-04-22 DIAGNOSIS — I4891 Unspecified atrial fibrillation: Secondary | ICD-10-CM

## 2013-05-21 ENCOUNTER — Ambulatory Visit (INDEPENDENT_AMBULATORY_CARE_PROVIDER_SITE_OTHER): Payer: Medicare Other | Admitting: *Deleted

## 2013-05-21 DIAGNOSIS — I4891 Unspecified atrial fibrillation: Secondary | ICD-10-CM | POA: Diagnosis not present

## 2013-06-05 ENCOUNTER — Encounter: Payer: Self-pay | Admitting: Family Medicine

## 2013-06-05 ENCOUNTER — Ambulatory Visit (INDEPENDENT_AMBULATORY_CARE_PROVIDER_SITE_OTHER): Payer: Medicare Other | Admitting: Family Medicine

## 2013-06-05 VITALS — BP 150/80 | HR 64 | Temp 97.6°F | Wt 192.8 lb

## 2013-06-05 DIAGNOSIS — R32 Unspecified urinary incontinence: Secondary | ICD-10-CM

## 2013-06-05 DIAGNOSIS — N3946 Mixed incontinence: Secondary | ICD-10-CM | POA: Diagnosis not present

## 2013-06-05 DIAGNOSIS — N63 Unspecified lump in unspecified breast: Secondary | ICD-10-CM | POA: Diagnosis not present

## 2013-06-05 DIAGNOSIS — N649 Disorder of breast, unspecified: Secondary | ICD-10-CM | POA: Diagnosis not present

## 2013-06-05 DIAGNOSIS — N6310 Unspecified lump in the right breast, unspecified quadrant: Secondary | ICD-10-CM

## 2013-06-05 LAB — POCT URINALYSIS DIPSTICK
Bilirubin, UA: NEGATIVE
Glucose, UA: NEGATIVE
Ketones, UA: NEGATIVE

## 2013-06-05 NOTE — Addendum Note (Signed)
Addended by: Alvina Chou on: 06/05/2013 04:22 PM   Modules accepted: Orders

## 2013-06-05 NOTE — Patient Instructions (Addendum)
Good to see you. Please call your dermatologist immediately.  Please stop by to see Shirlee Limerick on your way out to set up your referral.

## 2013-06-05 NOTE — Progress Notes (Signed)
Subjective:    Patient ID: [name], female    DOB: [birth year], 67 y.o.   MRN: [id number]  HPI Very pleasant 67 yo female with h/o a fib who I have not seen in 2 years here for:  1.  Right breast lesion and mass- noticed it a few months ago.  It is not getting smaller- it itches.  Underneath that lesion, she feels a pea sized, painful mass.  Used to see Dr. Huel Cote, OBGYN. Has not been back in years.  Not sure when she last had a mammogram.  Mother was 18 yo when she had breast CA.   2.  Urinary incontinence- worsening past few months.  Mainly urge incontinence but if she laughs or bends down it gets worse.  Has not had a hysterectomy. No dysuria.  Patient Active Problem List   Diagnosis Date Noted  . Chronic anticoagulation 09/12/2012  . Atrial fibrillation 01/10/2011  . Carotid stenosis 01/10/2011  . DYSPNEA 01/10/2011  . COLONIC POLYPS, ADENOMATOUS, HX OF 11/04/2010  . HEMORRHOIDS-INTERNAL 11/03/2010  . HIATAL HERNIA 11/03/2010  . DIVERTICULAR DISEASE 11/03/2010  . RECTAL BLEEDING 11/03/2010  . FATTY LIVER DISEASE, HX OF 11/03/2010  . HYPERCHOLESTEROLEMIA 08/10/2010  . FATIGUE 12/22/2009  . CHOLELITHIASIS 08/06/2008  . HYPERTENSION 07/25/2008  . INSOMNIA, CHRONIC 05/21/2007  . RESTLESS LEG SYNDROME 05/21/2007  . SLEEP APNEA 05/21/2007   Past Medical History  Diagnosis Date  . Paroxysmal atrial fibrillation   . Diverticulosis   . Hiatal hernia   . Hyperlipidemia   . Hypertension   . Chronic insomnia   . Sleep apnea   . Restless leg syndrome   . Hx of adenomatous colonic polyps   . Internal hemorrhoids   . Carotid bruit   . Fatty liver   . Colon polyps   . Pure hypercholesterolemia   . Special screening for malignant neoplasms, colon   . Routine general medical examination at a health care facility   . Other malaise and fatigue   . Screening for lipoid disorders   . Other acute sinusitis   . Acute upper respiratory infections of unspecified  site    Past Surgical History  Procedure Laterality Date  . Tubal ligation    . Cholecystectomy    . Cardiovascular stress mri  12/20/06    afib  . Orif- l distal radius  9/07  . Cardiolyte neg  6/06  . Knee arthroscopy      left   History  Substance Use Topics  . Smoking status: Never Smoker   . Smokeless tobacco: Never Used  . Alcohol Use: 0.6 oz/week    1 Glasses of wine per week     Comment: occasional   Family History  Problem Relation Age of Onset  . Breast cancer Mother   . Heart disease Father   . Diabetes      cousins  . Colon cancer Neg Hx    Allergies  Allergen Reactions  . Amoxicillin Other (See Comments)    blister  . Codeine     REACTION: vomiting   Current Outpatient Prescriptions on File Prior to Visit  Medication Sig Dispense Refill  . losartan (COZAAR) 100 MG tablet Take 0.5 tablets (50 mg total) by mouth daily.  30 tablet  6  . propafenone (RYTHMOL) 225 MG tablet TAKE 1 TABLET (225 MG TOTAL) BY MOUTH 2 (TWO) TIMES DAILY.  60 tablet  5  . topiramate (TOPAMAX) 25 MG tablet TAKE ONE TABLET BY MOUTH IN  THE MORNING  90 tablet  0  . warfarin (COUMADIN) 5 MG tablet Take as directed by Anticoagulation clinic  30 tablet  2  . zolpidem (AMBIEN) 10 MG tablet Take 10 mg by mouth at bedtime as needed. For sleep      . metoprolol succinate (TOPROL-XL) 25 MG 24 hr tablet Take with or immediately following a meal.  30 tablet  6   No current facility-administered medications on file prior to visit.   The PMH, PSH, Social History, Family History, Medications, and allergies have been reviewed in [facility], and have been updated if relevant.   Review of Systems    See HPI Objective:   Physical Exam BP 150/80  Pulse 64  Temp(Src) 97.6 F (36.4 C) (Oral)  Wt 192 lb 12.8 oz (87.454 kg)  BMI 34.16 kg/m2 Gen:  Alert, pleasant, NAD Right breast: Large raised, pink lesion on right breast At 6 oclock, firm, pea sized mass, mildly TTP No nipple discharge Left breast:   No masses or changes in nipple noted Psych:  Good eye contact, not anxious or depressed appearing    Assessment & Plan:   1. Skin lesion of breast ?SCC vs BCC. She has a dermatologist- cannot remember his name.  She will call him to make and appt and call me once this has been made or if she has difficulty making the appt.  2. Breast mass, right New- diag mammogram, bilateral, due for screening as well. - MM Digital Diagnostic Bilat; Future  3. Urinary incontinence, mixed Progressive- UA pos small blood. Will send for cx. Send to urology- she would like to avoid medication if possible. - Ambulatory referral to Urology

## 2013-06-06 ENCOUNTER — Other Ambulatory Visit: Payer: Self-pay | Admitting: Family Medicine

## 2013-06-06 DIAGNOSIS — N63 Unspecified lump in unspecified breast: Secondary | ICD-10-CM | POA: Diagnosis not present

## 2013-06-06 DIAGNOSIS — N6310 Unspecified lump in the right breast, unspecified quadrant: Secondary | ICD-10-CM

## 2013-06-06 DIAGNOSIS — N6009 Solitary cyst of unspecified breast: Secondary | ICD-10-CM | POA: Diagnosis not present

## 2013-06-07 ENCOUNTER — Encounter: Payer: Self-pay | Admitting: Family Medicine

## 2013-06-07 LAB — URINE CULTURE: Organism ID, Bacteria: NO GROWTH

## 2013-06-11 ENCOUNTER — Ambulatory Visit (INDEPENDENT_AMBULATORY_CARE_PROVIDER_SITE_OTHER): Payer: Medicare Other | Admitting: *Deleted

## 2013-06-11 DIAGNOSIS — I4891 Unspecified atrial fibrillation: Secondary | ICD-10-CM

## 2013-06-11 LAB — POCT INR: INR: 2.9

## 2013-06-15 ENCOUNTER — Other Ambulatory Visit (HOSPITAL_COMMUNITY): Payer: Self-pay | Admitting: Cardiology

## 2013-06-15 ENCOUNTER — Other Ambulatory Visit (HOSPITAL_COMMUNITY): Payer: Self-pay | Admitting: Internal Medicine

## 2013-06-17 ENCOUNTER — Other Ambulatory Visit: Payer: Self-pay | Admitting: Neurology

## 2013-06-17 ENCOUNTER — Other Ambulatory Visit (HOSPITAL_COMMUNITY): Payer: Self-pay | Admitting: Internal Medicine

## 2013-06-20 ENCOUNTER — Other Ambulatory Visit: Payer: Self-pay | Admitting: *Deleted

## 2013-06-20 MED ORDER — WARFARIN SODIUM 5 MG PO TABS: ORAL_TABLET | ORAL | Status: DC

## 2013-07-23 ENCOUNTER — Other Ambulatory Visit: Payer: Self-pay

## 2013-07-23 MED ORDER — PROPAFENONE HCL 225 MG PO TABS: 225.0000 mg | ORAL_TABLET | Freq: Three times a day (TID) | ORAL | Status: DC

## 2013-07-23 MED ORDER — METOPROLOL SUCCINATE ER 50 MG PO TB24: 50.0000 mg | ORAL_TABLET | Freq: Every day | ORAL | Status: DC

## 2013-07-23 MED ORDER — LOSARTAN POTASSIUM 100 MG PO TABS: 50.0000 mg | ORAL_TABLET | Freq: Every day | ORAL | Status: DC

## 2013-07-25 ENCOUNTER — Other Ambulatory Visit: Payer: Self-pay | Admitting: *Deleted

## 2013-07-25 MED ORDER — WARFARIN SODIUM 5 MG PO TABS: ORAL_TABLET | ORAL | Status: DC

## 2013-07-25 NOTE — Telephone Encounter (Signed)
Patient request 90 day supply Called patient and made her coumadin clinic appointment since she missed last one

## 2013-07-30 ENCOUNTER — Other Ambulatory Visit: Payer: Self-pay

## 2013-07-30 ENCOUNTER — Ambulatory Visit (INDEPENDENT_AMBULATORY_CARE_PROVIDER_SITE_OTHER): Payer: Medicare Other

## 2013-07-30 DIAGNOSIS — I4891 Unspecified atrial fibrillation: Secondary | ICD-10-CM

## 2013-07-30 LAB — POCT INR: INR: 2

## 2013-07-30 MED ORDER — ZOLPIDEM TARTRATE 10 MG PO TABS: 10.0000 mg | ORAL_TABLET | Freq: Every evening | ORAL | Status: DC | PRN

## 2013-07-30 NOTE — Telephone Encounter (Signed)
Patient called requesting a refill on Ambien.  She has an appt scheduled in 2015.

## 2013-07-31 NOTE — Telephone Encounter (Signed)
Rx signed and faxed.

## 2013-08-22 ENCOUNTER — Other Ambulatory Visit: Payer: Self-pay | Admitting: Cardiology

## 2013-08-27 ENCOUNTER — Ambulatory Visit (INDEPENDENT_AMBULATORY_CARE_PROVIDER_SITE_OTHER): Payer: Medicare Other | Admitting: Pharmacist

## 2013-08-27 DIAGNOSIS — I4891 Unspecified atrial fibrillation: Secondary | ICD-10-CM | POA: Diagnosis not present

## 2013-08-27 LAB — POCT INR: INR: 2.6

## 2013-09-30 ENCOUNTER — Ambulatory Visit (INDEPENDENT_AMBULATORY_CARE_PROVIDER_SITE_OTHER): Payer: Medicare Other | Admitting: Pharmacist

## 2013-09-30 DIAGNOSIS — I4891 Unspecified atrial fibrillation: Secondary | ICD-10-CM | POA: Diagnosis not present

## 2013-10-29 ENCOUNTER — Ambulatory Visit (INDEPENDENT_AMBULATORY_CARE_PROVIDER_SITE_OTHER): Payer: Medicare Other | Admitting: Pharmacist

## 2013-10-29 DIAGNOSIS — I4891 Unspecified atrial fibrillation: Secondary | ICD-10-CM | POA: Diagnosis not present

## 2013-10-29 LAB — POCT INR: INR: 2.3

## 2013-11-26 ENCOUNTER — Ambulatory Visit (INDEPENDENT_AMBULATORY_CARE_PROVIDER_SITE_OTHER): Payer: Medicare Other | Admitting: *Deleted

## 2013-11-26 DIAGNOSIS — I4891 Unspecified atrial fibrillation: Secondary | ICD-10-CM

## 2013-11-26 LAB — POCT INR: INR: 2.5

## 2013-11-28 ENCOUNTER — Other Ambulatory Visit (HOSPITAL_COMMUNITY): Payer: Self-pay | Admitting: Cardiology

## 2013-12-10 ENCOUNTER — Encounter: Payer: Self-pay | Admitting: Neurology

## 2014-01-01 ENCOUNTER — Encounter: Payer: Self-pay | Admitting: Neurology

## 2014-01-01 ENCOUNTER — Ambulatory Visit (INDEPENDENT_AMBULATORY_CARE_PROVIDER_SITE_OTHER): Payer: Medicare Other | Admitting: Neurology

## 2014-01-01 ENCOUNTER — Ambulatory Visit (INDEPENDENT_AMBULATORY_CARE_PROVIDER_SITE_OTHER): Payer: Medicare Other | Admitting: *Deleted

## 2014-01-01 VITALS — BP 172/86 | HR 59 | Resp 15 | Ht 62.0 in | Wt 200.0 lb

## 2014-01-01 DIAGNOSIS — G4700 Insomnia, unspecified: Secondary | ICD-10-CM

## 2014-01-01 DIAGNOSIS — R0683 Snoring: Secondary | ICD-10-CM

## 2014-01-01 DIAGNOSIS — I4891 Unspecified atrial fibrillation: Secondary | ICD-10-CM

## 2014-01-01 DIAGNOSIS — I10 Essential (primary) hypertension: Secondary | ICD-10-CM | POA: Diagnosis not present

## 2014-01-01 DIAGNOSIS — R0609 Other forms of dyspnea: Secondary | ICD-10-CM | POA: Diagnosis not present

## 2014-01-01 DIAGNOSIS — R0989 Other specified symptoms and signs involving the circulatory and respiratory systems: Secondary | ICD-10-CM

## 2014-01-01 DIAGNOSIS — R519 Headache, unspecified: Secondary | ICD-10-CM

## 2014-01-01 DIAGNOSIS — E7800 Pure hypercholesterolemia, unspecified: Secondary | ICD-10-CM | POA: Diagnosis not present

## 2014-01-01 DIAGNOSIS — R51 Headache: Secondary | ICD-10-CM | POA: Diagnosis not present

## 2014-01-01 HISTORY — DX: Insomnia, unspecified: G47.00

## 2014-01-01 LAB — POCT INR: INR: 1.8

## 2014-01-01 MED ORDER — ZOLPIDEM TARTRATE 10 MG PO TABS: 10.0000 mg | ORAL_TABLET | Freq: Every evening | ORAL | Status: DC | PRN

## 2014-01-01 MED ORDER — TOPIRAMATE 25 MG PO TABS: 25.0000 mg | ORAL_TABLET | Freq: Every day | ORAL | Status: DC

## 2014-01-01 NOTE — Progress Notes (Signed)
[name]  Provider:  Larey Seat, M D  Referring Provider: Lucille Passy, MD Primary Care Physician:  Arnette Norris, MD  Chief Complaint  Patient presents with  . Follow-up    Room 10  . Insomnia    HPI:  [name] is a 68 y.o. female  Is seen here as a referral/ revisit  from Dr. Deborra Medina for chronic insomnia.   The patient was last seen on  01-20-12  and has lost a lot of weight in the meantime.  After trazodone and Remeron did feel to produce satisfying results for now she will sometimes take a Benadryl or Tylenol PM to assist her with her sleep initiation. She thinks was a night was never the main problem. The patient states that she has lost a lot of weight intentionally and that this has helped certainly some of her other health issues including hypertension. She was diagnosed with atrial fibrillation in the past has been on chronic anticoagulation. She reported that topiramate caused her to be drowsy and daytime when taken in the morning for that reason we will change the intake time 2 PM this may assist with her sleepiness as well as was her headaches. It has also helped her to lose weight.  At bedtime routines were reviewed : the patient was to bed around 11 PM rises at 6:30 AM depending on how much sleep she got.  She did have amnestic spells from after some nights when using Ambien but she sleeps all much better on Ambien but on the other medications.  The onset of her insomnia was related to the development of menopause but she only has really hot flashes now.   Her bedroom is described as quiet, cool and dark she shares a bedroom with her husband.      As to  past medical history she see previous notes.      Review of Systems: Out of a complete 14 system review, the patient complains of only the following symptoms, and all other reviewed systems are negative. The patient endorsed a runny nose, some hearing loss, some palpitations and  intolerance of cold temperatures, restless legs and insomnia daytime sleepiness and snoring.  History   Social History  . Marital Status: Married    Spouse Name: [name]    Number of Children: 3  . Years of Education: College   Occupational History  . BOOK KEEPER    Social History Main Topics  . Smoking status: Never Smoker   . Smokeless tobacco: Never Used  . Alcohol Use: 0.6 oz/week    1 Glasses of wine per week     Comment: rarely  . Drug Use: No  . Sexual Activity: Not on file   Other Topics Concern  . Not on file   Social History Narrative   Patient is married [name]) and lives at home with her husband.   Patient has three adult children.   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    Patient is right-handed.   Patient drinks two cups of caffeine daily.   Patient has a college education.    Family History  Problem Relation Age of Onset  . Breast cancer Mother   . Heart disease Father   . Diabetes      cousins  . Colon cancer Neg Hx     Past Medical History  Diagnosis Date  . Paroxysmal atrial fibrillation   . Diverticulosis   .  Hiatal hernia   . Hyperlipidemia   . Hypertension   . Chronic insomnia   . Sleep apnea   . Restless leg syndrome   . Hx of adenomatous colonic polyps   . Internal hemorrhoids   . Carotid bruit   . Fatty liver   . Colon polyps   . Pure hypercholesterolemia   . Special screening for malignant neoplasms, colon   . Routine general medical examination at a health care facility   . Other malaise and fatigue   . Screening for lipoid disorders   . Other acute sinusitis   . Acute upper respiratory infections of unspecified site   . Menopause     noticed in early 53's  . Insomnia, controlled   . Insomnia, controlled 01/01/2014    Past Surgical History  Procedure Laterality Date  . Tubal ligation    . Cholecystectomy    . Cardiovascular stress mri  12/20/06    afib  . Orif- l distal  radius  9/07  . Cardiolyte neg  6/06  . Knee arthroscopy      left    Current Outpatient Prescriptions  Medication Sig Dispense Refill  . losartan (COZAAR) 100 MG tablet Take 0.5 tablets (50 mg total) by mouth daily.  30 tablet  6  . metoprolol succinate (TOPROL-XL) 50 MG 24 hr tablet Take 1 tablet (50 mg total) by mouth daily. Take with or immediately following a meal.  30 tablet  6  . propafenone (RYTHMOL) 225 MG tablet Take 1 tablet (225 mg total) by mouth every 8 (eight) hours.  90 tablet  6  . topiramate (TOPAMAX) 25 MG tablet TAKE ONE TABLET BY MOUTH IN THE MORNING  30 tablet  0  . warfarin (COUMADIN) 5 MG tablet Take as directed by Anticoagulation clinic  90 tablet  1  . zolpidem (AMBIEN) 10 MG tablet Take 1 tablet (10 mg total) by mouth at bedtime as needed. For sleep  30 tablet  5   No current facility-administered medications for this visit.    Allergies as of 01/01/2014 - Review Complete 01/01/2014  Allergen Reaction Noted  . Amoxicillin Other (See Comments) 03/03/2012  . Codeine      Vitals: BP 172/86  Pulse 59  Resp 15  Ht 5\' 2"  (1.575 m)  Wt 200 lb (90.719 kg)  BMI 36.57 kg/m2 Last Weight:  Wt Readings from Last 1 Encounters:  01/01/14 200 lb (90.719 kg)   Last Height:   Ht Readings from Last 1 Encounters:  01/01/14 5\' 2"  (1.575 m)    Physical exam:  General: The patient is awake, alert and appears not in acute distress. The patient is well groomed. Head: Normocephalic, atraumatic. Neck is supple. Mallampati 3 , neck [id number]:16 ,no TMJ clicking.  Cardiovascular:  irregular rate and rhythm , without  murmurs or carotid bruit, and without distended neck veins. Respiratory: Lungs are clear to auscultation. Skin:  Without evidence of edema, or rash Trunk: BMI is  elevated , the patient  has normal posture.  Neurologic exam : The patient is awake and alert, oriented to place and time.  Memory subjective described as intact. There is a normal attention  span & concentration ability. Speech is fluent without  dysarthria, dysphonia or aphasia. Mood and affect are appropriate.  Cranial nerves: Pupils are equal and briskly reactive to light. Funduscopic exam without  evidence of pallor or edema. Extraocular movements  in vertical and horizontal planes intact , she has left over right ptosis.  Visual fields by finger perimetry are intact. Hearing to finger rub intact.  Facial sensation intact to fine touch.  Facial motor strength is symmetric and tongue and uvula move midline.  Motor exam:   Normal tone and normal muscle bulk and symmetric normal strength in all extremities.  Sensory:  Fine touch, pinprick and vibration were normal in all extremities. Proprioception is normal. She has right wrist carpal tunnel residual.  Coordination: Rapid alternating movements in the fingers/hands is tested and normal. Finger-to-nose maneuver tested and normal without evidence of ataxia, dysmetria or tremor.  Gait and station: Patient walks without assistive device . Strength within normal limits. Stance is stable and normal. Tandem gait is unfragmented. Romberg testing is normal.  Deep tendon reflexes: in the  upper and lower extremities are symmetric and intact. Babinski maneuver response is downgoing.     Assessment:  After physical and neurologic examination, review of laboratory studies, imaging, neurophysiology testing and pre-existing records, assessment is   1) likely Insomnia with OSA - patient reported reluctantly about her snoring and waking up with a dry mouth.  No morning headaches , no nocturia.  BMI is still elevated, atrial fibrillation.  No headaches on topiramate, which helped with weight loss, too.    Plan:  Treatment plan and additional workup : 1) refilled medications , including ambien and  Topamax.  2)I will order a SPLIT study AHi at 15 and a scored at 4% for suspected OSA.  3) patient failed Sonata, Remoron and Temazepam, and  Trazodone. Ambien refilled.

## 2014-01-01 NOTE — Patient Instructions (Signed)

## 2014-01-16 ENCOUNTER — Telehealth: Payer: Self-pay | Admitting: Family Medicine

## 2014-01-16 NOTE — Telephone Encounter (Signed)
Patient Information:  Caller Name: Priscella  Phone: [phone]  Patient: [name], [name]  Gender: Female  DOB: January 02, 1946  Age: 68 Years  PCP: Arnette Norris [facility])  Office Follow Up:  Does the office need to follow up with this patient?: No  Instructions For The Office: N/A   Symptoms  Reason For Call & Symptoms: Patient reports sinus congestion and possible infection.  Sinus pain rated as moderate as it interferes with activities.  Emergent symptoms ruled out.  See Today in Office per Sinus Pain and  Congestion guideline due to Earache (ringing in ears).  Reviewed Health History In EMR: Yes  Reviewed Medications In EMR: Yes  Reviewed Allergies In EMR: Yes  Reviewed Surgeries / Procedures: Yes  Date of Onset of Symptoms: 01/12/2014  Treatments Tried: OTC sinus medication, nose spray with temporary relief  Treatments Tried Worked: No  Guideline(s) Used:  Sinus Pain and Congestion  Disposition Per Guideline:   See Today in Office  Reason For Disposition Reached:   Earache  Advice Given:  Medicines for a Stuffy or Runny Nose:  Antihistamines: Are only helpful if you also have nasal allergies.  Nasal Decongestants for a Very Stuffy or Runny Nose:  If you have a very stuffy nose, nasal decongestant medicines can shrink the swollen nasal mucosa and allow for easier breathing. If you have a very runny nose, these medicines can reduce the amount of drainage. They may be taken as pills by mouth or as a nasal spray.  Phenylephrine (Sudafed PE) is available OTC in pill form. Typical adult dosage is two 30 mg tablets every 6 hours.  Pain and Fever Medicines:  Acetaminophen (e.g., Tylenol):  Regular Strength Tylenol: Take 650 mg (two 325 mg pills) by mouth every 4-6 hours as needed. Each Regular Strength Tylenol pill has 325 mg of acetaminophen.  Hydration:  Drink plenty of liquids (6-8 glasses of water daily). If the air in your home is dry, use a cool mist humidifier  Expected Course:  Sinus congestion from viral upper respiratory infections (colds) usually lasts 5-10 days.  Occasionally a cold can worsen and turn into bacterial sinusitis. Clues to this are sinus symptoms lasting longer than 10 days, fever lasting longer than 3 days, and worsening pain. Bacterial sinusitis may need antibiotic treatment.  Call Back If:   You become worse.  Patient Refused Recommendation:  Patient Refused Appt, Patient Requests Appt At Later Date  States plan to follow up with office at appointment that is scheduled for 01/17/14 @ 13:15 wiht Dr. Deborra Medina.  Declined appointment at another location.

## 2014-01-17 ENCOUNTER — Encounter: Payer: Self-pay | Admitting: Family Medicine

## 2014-01-17 ENCOUNTER — Ambulatory Visit (INDEPENDENT_AMBULATORY_CARE_PROVIDER_SITE_OTHER): Payer: Medicare Other | Admitting: Family Medicine

## 2014-01-17 VITALS — BP 128/88 | HR 73 | Temp 98.2°F | Ht 61.5 in | Wt 195.8 lb

## 2014-01-17 DIAGNOSIS — J329 Chronic sinusitis, unspecified: Secondary | ICD-10-CM | POA: Diagnosis not present

## 2014-01-17 MED ORDER — AZITHROMYCIN 250 MG PO TABS: ORAL_TABLET | ORAL | Status: AC

## 2014-01-17 NOTE — Progress Notes (Signed)
SUBJECTIVE:  [name] is a 68 y.o. female who complains of coryza, congestion, sneezing, sore throat and bilateral sinus pain for 8 days. She denies a history of anorexia and chest pain and denies a history of asthma. Patient denies smoke cigarettes.   She is on coumadin for a fib.  She is taking delsym and sudafed for symptoms.  Teeth are also hurting.  Patient Active Problem List   Diagnosis Date Noted  . Insomnia, controlled 01/01/2014  . Skin lesion of breast 06/05/2013  . Urinary incontinence, mixed 06/05/2013  . Chronic anticoagulation 09/12/2012  . Atrial fibrillation 01/10/2011  . Carotid stenosis 01/10/2011  . DYSPNEA 01/10/2011  . COLONIC POLYPS, ADENOMATOUS, HX OF 11/04/2010  . HEMORRHOIDS-INTERNAL 11/03/2010  . HIATAL HERNIA 11/03/2010  . DIVERTICULAR DISEASE 11/03/2010  . RECTAL BLEEDING 11/03/2010  . FATTY LIVER DISEASE, HX OF 11/03/2010  . HYPERCHOLESTEROLEMIA 08/10/2010  . FATIGUE 12/22/2009  . CHOLELITHIASIS 08/06/2008  . HYPERTENSION 07/25/2008  . INSOMNIA, CHRONIC 05/21/2007  . RESTLESS LEG SYNDROME 05/21/2007  . SLEEP APNEA 05/21/2007   Past Medical History  Diagnosis Date  . Paroxysmal atrial fibrillation   . Diverticulosis   . Hiatal hernia   . Hyperlipidemia   . Hypertension   . Chronic insomnia   . Sleep apnea   . Restless leg syndrome   . Hx of adenomatous colonic polyps   . Internal hemorrhoids   . Carotid bruit   . Fatty liver   . Colon polyps   . Pure hypercholesterolemia   . Special screening for malignant neoplasms, colon   . Routine general medical examination at a health care facility   . Other malaise and fatigue   . Screening for lipoid disorders   . Other acute sinusitis   . Acute upper respiratory infections of unspecified site   . Menopause     noticed in early 93's  . Insomnia, controlled   . Insomnia, controlled 01/01/2014   Past Surgical History  Procedure Laterality Date  . Tubal ligation    .  Cholecystectomy    . Cardiovascular stress mri  12/20/06    afib  . Orif- l distal radius  9/07  . Cardiolyte neg  6/06  . Knee arthroscopy      left   History  Substance Use Topics  . Smoking status: Never Smoker   . Smokeless tobacco: Never Used  . Alcohol Use: 0.6 oz/week    1 Glasses of wine per week     Comment: rarely   Family History  Problem Relation Age of Onset  . Breast cancer Mother   . Heart disease Father   . Diabetes      cousins  . Colon cancer Neg Hx    Allergies  Allergen Reactions  . Amoxicillin Other (See Comments)    blister  . Codeine     REACTION: vomiting   Current Outpatient Prescriptions on File Prior to Visit  Medication Sig Dispense Refill  . losartan (COZAAR) 100 MG tablet Take 0.5 tablets (50 mg total) by mouth daily.  30 tablet  6  . metoprolol succinate (TOPROL-XL) 50 MG 24 hr tablet Take 1 tablet (50 mg total) by mouth daily. Take with or immediately following a meal.  30 tablet  6  . propafenone (RYTHMOL) 225 MG tablet Take 1 tablet (225 mg total) by mouth every 8 (eight) hours.  90 tablet  6  . topiramate (TOPAMAX) 25 MG tablet Take 1 tablet (25 mg total) by mouth daily.  Take one in PM , 1-2  hours before bedtime. Migraine Prophylaxis.  90 tablet  3  . warfarin (COUMADIN) 5 MG tablet Take as directed by Anticoagulation clinic  90 tablet  1  . zolpidem (AMBIEN) 10 MG tablet Take 1 tablet (10 mg total) by mouth at bedtime as needed. For sleep  30 tablet  5   No current facility-administered medications on file prior to visit.   The PMH, PSH, Social History, Family History, Medications, and allergies have been reviewed in [facility], and have been updated if relevant.  OBJECTIVE: BP 128/88  Pulse 73  Temp(Src) 98.2 F (36.8 C) (Oral)  Ht 5' 1.5" (1.562 m)  Wt 195 lb 12 oz (88.792 kg)  BMI 36.39 kg/m2  SpO2 98%  She appears well, vital signs are as noted. Ears normal.  Throat and pharynx normal.  Neck supple. No adenopathy in the neck. Nose  is congested. Frontal sinuses tender. The chest is clear, without wheezes or rales.  ASSESSMENT:  sinusitis  PLAN: Given duration and progression of symptoms, will treat for bacterial sinusitis with Zpack (PCN allergic).  She is having INR checked next week.  Discussed red flag symptoms which would suggest checking it sooner. Symptomatic therapy suggested: push fluids, rest and return office visit prn if symptoms persist or worsen.Call or return to clinic prn if these symptoms worsen or fail to improve as anticipated.

## 2014-01-17 NOTE — Progress Notes (Signed)
Pre visit review using our clinic review tool, if applicable. No additional management support is needed unless otherwise documented below in the visit note. 

## 2014-01-17 NOTE — Patient Instructions (Signed)
Take Zpack as directed.  Drink lots of fluids.   Try over the counter nasocort-start with 2 sprays per nostril per day...and then try to taper to 1 spray per nostril once symptoms improve.   Keep your appointment to have your coumadin levels checked next week.   Call if not improving as expected in 5-7 days.

## 2014-01-19 ENCOUNTER — Other Ambulatory Visit: Payer: Self-pay | Admitting: Cardiology

## 2014-01-29 ENCOUNTER — Ambulatory Visit (INDEPENDENT_AMBULATORY_CARE_PROVIDER_SITE_OTHER): Payer: Medicare Other

## 2014-01-29 ENCOUNTER — Telehealth: Payer: Self-pay

## 2014-01-29 ENCOUNTER — Ambulatory Visit (INDEPENDENT_AMBULATORY_CARE_PROVIDER_SITE_OTHER): Payer: Medicare Other | Admitting: *Deleted

## 2014-01-29 DIAGNOSIS — I4891 Unspecified atrial fibrillation: Secondary | ICD-10-CM

## 2014-01-29 DIAGNOSIS — R0683 Snoring: Secondary | ICD-10-CM

## 2014-01-29 DIAGNOSIS — G4733 Obstructive sleep apnea (adult) (pediatric): Secondary | ICD-10-CM | POA: Diagnosis not present

## 2014-01-29 DIAGNOSIS — G4700 Insomnia, unspecified: Secondary | ICD-10-CM

## 2014-01-29 DIAGNOSIS — R519 Headache, unspecified: Secondary | ICD-10-CM

## 2014-01-29 DIAGNOSIS — R51 Headache: Secondary | ICD-10-CM

## 2014-01-29 LAB — POCT INR: INR: 4.3

## 2014-01-29 NOTE — Telephone Encounter (Signed)
She was given a zpack on 3/13.  This would have just finished it's course in her body.  Please give it a few more days of supportive care.  We do not want to make the mistake of overusing antibiotics.

## 2014-01-29 NOTE — Telephone Encounter (Signed)
Pt was seen 01/17/14;pt finished antibiotic; pt is better but still has drainage at back of throat with prod cough with thick light yellow, no fever,wheezing or SOB. Pt request antibiotic sent to [facility].

## 2014-01-30 NOTE — Telephone Encounter (Signed)
Lm on pts vm requesting a call back 

## 2014-01-31 NOTE — Telephone Encounter (Signed)
Lm on pts vm requesting a call back 

## 2014-01-31 NOTE — Telephone Encounter (Signed)
Spoke to pt and advised per Dr Deborra Medina. States that she will contact office next week with update of s/s

## 2014-02-11 ENCOUNTER — Ambulatory Visit (INDEPENDENT_AMBULATORY_CARE_PROVIDER_SITE_OTHER): Payer: Medicare Other | Admitting: *Deleted

## 2014-02-11 DIAGNOSIS — I4891 Unspecified atrial fibrillation: Secondary | ICD-10-CM

## 2014-02-11 LAB — POCT INR: INR: 3.1

## 2014-02-13 ENCOUNTER — Telehealth: Payer: Self-pay | Admitting: Neurology

## 2014-02-13 ENCOUNTER — Encounter: Payer: Self-pay | Admitting: *Deleted

## 2014-02-13 DIAGNOSIS — G4733 Obstructive sleep apnea (adult) (pediatric): Secondary | ICD-10-CM

## 2014-02-13 NOTE — Telephone Encounter (Signed)
I called and spoke with the patient about her recent sleep study results. 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 Dr. Brett Fairy recommend PAP therapy which require a repeat study for the proper titration and mask fitting. Patient stated she will callback to schedule her repeat study for May. I will fax a copy of the report to Dr. Tanja Port Aron's office and mail a copy of the report to the patient.

## 2014-02-24 ENCOUNTER — Other Ambulatory Visit: Payer: Self-pay | Admitting: Cardiology

## 2014-02-25 ENCOUNTER — Other Ambulatory Visit: Payer: Self-pay | Admitting: Cardiology

## 2014-03-04 ENCOUNTER — Ambulatory Visit (INDEPENDENT_AMBULATORY_CARE_PROVIDER_SITE_OTHER): Payer: Medicare Other | Admitting: Pharmacist

## 2014-03-04 DIAGNOSIS — I4891 Unspecified atrial fibrillation: Secondary | ICD-10-CM | POA: Diagnosis not present

## 2014-03-04 LAB — POCT INR: INR: 2.1

## 2014-04-08 ENCOUNTER — Ambulatory Visit (INDEPENDENT_AMBULATORY_CARE_PROVIDER_SITE_OTHER): Payer: Medicare Other | Admitting: Pharmacist

## 2014-04-08 DIAGNOSIS — I4891 Unspecified atrial fibrillation: Secondary | ICD-10-CM | POA: Diagnosis not present

## 2014-04-08 LAB — POCT INR: INR: 3.3

## 2014-04-29 ENCOUNTER — Ambulatory Visit (INDEPENDENT_AMBULATORY_CARE_PROVIDER_SITE_OTHER): Payer: Medicare Other | Admitting: *Deleted

## 2014-04-29 DIAGNOSIS — I4891 Unspecified atrial fibrillation: Secondary | ICD-10-CM | POA: Diagnosis not present

## 2014-04-29 LAB — POCT INR: INR: 2.2

## 2014-05-27 ENCOUNTER — Ambulatory Visit (INDEPENDENT_AMBULATORY_CARE_PROVIDER_SITE_OTHER): Payer: Medicare Other | Admitting: *Deleted

## 2014-05-27 DIAGNOSIS — I4891 Unspecified atrial fibrillation: Secondary | ICD-10-CM | POA: Diagnosis not present

## 2014-05-27 LAB — POCT INR: INR: 2.7

## 2014-06-24 ENCOUNTER — Ambulatory Visit (INDEPENDENT_AMBULATORY_CARE_PROVIDER_SITE_OTHER): Payer: Medicare Other | Admitting: *Deleted

## 2014-06-24 DIAGNOSIS — I4891 Unspecified atrial fibrillation: Secondary | ICD-10-CM

## 2014-06-24 LAB — POCT INR: INR: 2.9

## 2014-07-07 ENCOUNTER — Other Ambulatory Visit: Payer: Self-pay | Admitting: Neurology

## 2014-07-16 ENCOUNTER — Other Ambulatory Visit: Payer: Self-pay | Admitting: Cardiology

## 2014-08-05 ENCOUNTER — Ambulatory Visit (INDEPENDENT_AMBULATORY_CARE_PROVIDER_SITE_OTHER): Payer: Medicare Other | Admitting: Pharmacist Clinician (PhC)/ Clinical Pharmacy Specialist

## 2014-08-05 DIAGNOSIS — I4891 Unspecified atrial fibrillation: Secondary | ICD-10-CM | POA: Diagnosis not present

## 2014-08-05 LAB — POCT INR: INR: 3.4

## 2014-08-08 ENCOUNTER — Ambulatory Visit (INDEPENDENT_AMBULATORY_CARE_PROVIDER_SITE_OTHER): Payer: Medicare Other | Admitting: Internal Medicine

## 2014-08-08 ENCOUNTER — Telehealth: Payer: Self-pay | Admitting: Cardiology

## 2014-08-08 ENCOUNTER — Encounter: Payer: Self-pay | Admitting: Internal Medicine

## 2014-08-08 VITALS — BP 140/84 | HR 59 | Ht 63.0 in | Wt 195.0 lb

## 2014-08-08 DIAGNOSIS — R5383 Other fatigue: Secondary | ICD-10-CM

## 2014-08-08 DIAGNOSIS — R002 Palpitations: Secondary | ICD-10-CM

## 2014-08-08 DIAGNOSIS — Z79899 Other long term (current) drug therapy: Secondary | ICD-10-CM

## 2014-08-08 DIAGNOSIS — I480 Paroxysmal atrial fibrillation: Secondary | ICD-10-CM | POA: Diagnosis not present

## 2014-08-08 DIAGNOSIS — R5381 Other malaise: Secondary | ICD-10-CM | POA: Diagnosis not present

## 2014-08-08 DIAGNOSIS — I4891 Unspecified atrial fibrillation: Secondary | ICD-10-CM | POA: Diagnosis not present

## 2014-08-08 LAB — CBC
HCT: 43.3 % (ref 36.0–46.0)
Hemoglobin: 14.8 g/dL (ref 12.0–15.0)
MCH: 30.3 pg (ref 26.0–34.0)
MCHC: 34.2 g/dL (ref 30.0–36.0)
MCV: 88.7 fL (ref 78.0–100.0)
PLATELETS: 170 10*3/uL (ref 150–400)
RBC: 4.88 MIL/uL (ref 3.87–5.11)
RDW: 13.3 % (ref 11.5–15.5)
WBC: 8.4 10*3/uL (ref 4.0–10.5)

## 2014-08-08 MED ORDER — PROPAFENONE HCL 300 MG PO TABS: 300.0000 mg | ORAL_TABLET | Freq: Two times a day (BID) | ORAL | Status: DC

## 2014-08-08 NOTE — Telephone Encounter (Signed)
Pt called in stating that she has been in Afib for 2 days, her HR is at 110 and she just took her BP and it is 154/94. She would like to be seen if she could. Please call  Thanks

## 2014-08-08 NOTE — Progress Notes (Signed)
OFFICE NOTE  Chief Complaint:  A-fib, fatigue  Primary Care Physician: Arnette Norris, MD  HPI:  [name] is seen today for Dr. Martinique as the DOD for a-fib and fatigue. She has a history of hypertension atrial fibrillation on Rythmol. She last saw Dr. Martinique over year and a half and was doing fairly well with her A. fib. She reports that this past Friday she had another episode of A. fib were her rate was elevated for most of the night. The next day or rated on down around 50 and she felt very fatigued with little energy. She says that she generally has felt that way before with A. fib however this is much worse. This is now persisted for 2 days. She says that her rate intermittently goes out into the 100s. She reportedly is aware that her heart rate is back and rhythm today and her EKG does show sinus bradycardia. She denies any chest pain. She has no constitutional symptoms such as fever, chills or other signs or symptoms of infection such as cough, nausea, vomiting, diarrhea, burning in the urine, et Ronney Asters.    PMHx:  Past Medical History  Diagnosis Date  . Paroxysmal atrial fibrillation   . Diverticulosis   . Hiatal hernia   . Hyperlipidemia   . Hypertension   . Chronic insomnia   . Sleep apnea   . Restless leg syndrome   . Hx of adenomatous colonic polyps   . Internal hemorrhoids   . Carotid bruit   . Fatty liver   . Colon polyps   . Pure hypercholesterolemia   . Special screening for malignant neoplasms, colon   . Routine general medical examination at a health care facility   . Other malaise and fatigue   . Screening for lipoid disorders   . Other acute sinusitis   . Acute upper respiratory infections of unspecified site   . Menopause     noticed in early 99's  . Insomnia, controlled   . Insomnia, controlled 01/01/2014    Past Surgical History  Procedure Laterality Date  . Tubal ligation    . Cholecystectomy    . Cardiovascular stress mri  12/20/06    afib   . Orif- l distal radius  9/07  . Cardiolyte neg  6/06  . Knee arthroscopy      left    FAMHx:  Family History  Problem Relation Age of Onset  . Breast cancer Mother   . Heart disease Father   . Diabetes      cousins  . Colon cancer Neg Hx     SOCHx:   reports that she has never smoked. She has never used smokeless tobacco. She reports that she drinks about .6 ounces of alcohol per week. She reports that she does not use illicit drugs.  ALLERGIES:  Allergies  Allergen Reactions  . Codeine     REACTION: vomiting    ROS: A comprehensive review of systems was negative except for: Constitutional: positive for fatigue and malaise Cardiovascular: positive for palpitations  HOME MEDS: Current Outpatient Prescriptions  Medication Sig Dispense Refill  . losartan (COZAAR) 100 MG tablet Take 0.5 tablets (50 mg total) by mouth daily.  30 tablet  6  . metoprolol succinate (TOPROL-XL) 50 MG 24 hr tablet TAKE 1 TABLET (50 MG TOTAL) BY MOUTH DAILY. TAKE WITH OR IMMEDIATELY FOLLOWING A MEAL.  30 tablet  6  . topiramate (TOPAMAX) 25 MG tablet Take 1 tablet (25 mg  total) by mouth daily. Take one in PM , 1-2  hours before bedtime. Migraine Prophylaxis.  90 tablet  3  . warfarin (COUMADIN) 5 MG tablet TAKE AS DIRECTED BY ANTICOAGULATION CLINIC  90 tablet  1  . zolpidem (AMBIEN) 10 MG tablet TAKE 1 TABLET BY MOUTH AT BEDTIME AS NEEDED FOR SLEEP  30 tablet  5  . propafenone (RYTHMOL) 300 MG tablet Take 1 tablet (300 mg total) by mouth 2 (two) times daily.  60 tablet  6   No current facility-administered medications for this visit.    LABS/IMAGING: No results found for this or any previous visit (from the past 48 hour(s)). No results found.  VITALS: BP 140/84  Pulse 59  Ht 5\' 3"  (1.6 m)  Wt 195 lb (88.451 kg)  BMI 34.55 kg/m2  EXAM: General appearance: alert and no distress Neck: no carotid bruit and no JVD Lungs: clear to auscultation bilaterally Heart: regular rate and rhythm,  S1, S2 normal, no murmur, click, rub or gallop Abdomen: soft, non-tender; bowel sounds normal; no masses,  no organomegaly Extremities: extremities normal, atraumatic, no cyanosis or edema Pulses: 2+ and symmetric Skin: Skin color, texture, turgor normal. No rashes or lesions Neurologic: Grossly normal Psych: Anxious  EKG: Sinus bradycardia 59, QTc 397 msec  ASSESSMENT: 1. Paroxysmal atrial fibrillation 2. Fatigue/malaise  PLAN: 1.   Mrs. Hellmer is here today for evaluation of what seems to be paroxysmal later fibrillation. She says she's had several episodes over the past couple days were heart rate is elevated into 110-120 range. She feels fatigue and malaise related to this however even though her heart rate has improved and her rhythm is now back in sinus she still feels bad. Symptoms are worse than they had been in the past with her A. fib. At this point I would recommend increasing her Rythmol to 300 mg twice daily. However also recommend a two-week monitor to evaluate for any potential signs of tachybradycardia syndrome. Finally, we will obtain lab work including a CMP, CBC and urinalysis to make sure there is no other sign of infection or other potential cause of her symptoms.  Followup with Dr. Martinique in a few weeks.  Pixie Casino, MD, [facility] Attending Cardiologist [facility]  Mally Gavina C 08/08/2014, 2:50 PM

## 2014-08-08 NOTE — Telephone Encounter (Signed)
Returned a call to patient. She informs me that she has been going in and out of A-fib for two days. HR this AM was around 110 bpm. States that she is feeling better now. Recommended to patient to come into office today for evaluation by Dr. Debara Pickett (DOD).

## 2014-08-08 NOTE — Patient Instructions (Addendum)
Your physician has recommended that you wear an event monitor. Event monitors are medical devices that record the heart's electrical activity. Doctors most often [place] these monitors to diagnose arrhythmias. Arrhythmias are problems with the speed or rhythm of the heartbeat. The monitor is a small, portable device. You can wear one while you do your normal daily activities. This is usually used to diagnose what is causing palpitations/syncope (passing out). ** 2 week event monitor ** the sensor and monitor need to be within 100 feet of each other ** please call the company should your skin become sensitive to the stickers ** when finished with your 2 weeks, please package all the items in the silver box and mail to [place] has recommended you make the following change in your medication: INCREASE propafenone to 300mg  twice daily   Please have lab work TODAY  Your physician recommends that you schedule a follow-up appointment with Dr. Martinique after your monitor.

## 2014-08-09 LAB — COMPREHENSIVE METABOLIC PANEL
ALK PHOS: 83 U/L (ref 39–117)
ALT: 19 U/L (ref 0–35)
AST: 19 U/L (ref 0–37)
Albumin: 4.2 g/dL (ref 3.5–5.2)
BILIRUBIN TOTAL: 0.5 mg/dL (ref 0.2–1.2)
BUN: 27 mg/dL — ABNORMAL HIGH (ref 6–23)
CO2: 24 mEq/L (ref 19–32)
CREATININE: 0.93 mg/dL (ref 0.50–1.10)
Calcium: 8.6 mg/dL (ref 8.4–10.5)
Chloride: 108 mEq/L (ref 96–112)
Glucose, Bld: 105 mg/dL — ABNORMAL HIGH (ref 70–99)
Potassium: 4.4 mEq/L (ref 3.5–5.3)
SODIUM: 140 meq/L (ref 135–145)
Total Protein: 6.5 g/dL (ref 6.0–8.3)

## 2014-08-09 LAB — URINALYSIS
Bilirubin Urine: NEGATIVE
Glucose, UA: NEGATIVE mg/dL
HGB URINE DIPSTICK: NEGATIVE
Ketones, ur: NEGATIVE mg/dL
LEUKOCYTES UA: NEGATIVE
NITRITE: NEGATIVE
PROTEIN: NEGATIVE mg/dL
Specific Gravity, Urine: 1.03 (ref 1.005–1.030)
UROBILINOGEN UA: 0.2 mg/dL (ref 0.0–1.0)
pH: 5 (ref 5.0–8.0)

## 2014-08-12 ENCOUNTER — Other Ambulatory Visit: Payer: Self-pay | Admitting: *Deleted

## 2014-08-12 DIAGNOSIS — R002 Palpitations: Secondary | ICD-10-CM

## 2014-08-14 ENCOUNTER — Other Ambulatory Visit: Payer: Self-pay | Admitting: *Deleted

## 2014-08-14 MED ORDER — LOSARTAN POTASSIUM 100 MG PO TABS
50.0000 mg | ORAL_TABLET | Freq: Every day | ORAL | Status: DC
Start: 2014-08-14 — End: 2015-08-27

## 2014-08-26 ENCOUNTER — Ambulatory Visit (INDEPENDENT_AMBULATORY_CARE_PROVIDER_SITE_OTHER): Payer: Medicare Other | Admitting: Pharmacist Clinician (PhC)/ Clinical Pharmacy Specialist

## 2014-08-26 DIAGNOSIS — I4891 Unspecified atrial fibrillation: Secondary | ICD-10-CM | POA: Diagnosis not present

## 2014-08-26 LAB — POCT INR: INR: 1.5

## 2014-08-28 ENCOUNTER — Ambulatory Visit (INDEPENDENT_AMBULATORY_CARE_PROVIDER_SITE_OTHER): Payer: Medicare Other | Admitting: Cardiology

## 2014-08-28 ENCOUNTER — Encounter: Payer: Self-pay | Admitting: Cardiology

## 2014-08-28 VITALS — BP 151/86 | HR 55 | Ht 63.0 in | Wt 197.2 lb

## 2014-08-28 DIAGNOSIS — Z7901 Long term (current) use of anticoagulants: Secondary | ICD-10-CM | POA: Diagnosis not present

## 2014-08-28 DIAGNOSIS — I480 Paroxysmal atrial fibrillation: Secondary | ICD-10-CM | POA: Diagnosis not present

## 2014-08-28 DIAGNOSIS — R0602 Shortness of breath: Secondary | ICD-10-CM | POA: Diagnosis not present

## 2014-08-28 DIAGNOSIS — E7800 Pure hypercholesterolemia, unspecified: Secondary | ICD-10-CM

## 2014-08-28 DIAGNOSIS — I10 Essential (primary) hypertension: Secondary | ICD-10-CM

## 2014-08-28 DIAGNOSIS — I4891 Unspecified atrial fibrillation: Secondary | ICD-10-CM | POA: Diagnosis not present

## 2014-08-28 LAB — TSH: TSH: 2.086 u[IU]/mL (ref 0.350–4.500)

## 2014-08-28 NOTE — Progress Notes (Signed)
History of Present Illness: [name] is seen today to followup of atrial fibrillation. She has a history of paroxysmal atrial fibrillation managed with Rhythmol and metoprolol. She is on coumadin for anticoagulation. She was seen recently by Dr. Debara Pickett with complaints of palpitations and fatigue. Her Rhythmol dose was increased to 300 mg bid and an event monitor was placed. She notes some improvement in her palpitations but states she feels her heart "quiver" even when HR and BP are normal. Tolerating medication well. She does complain that over the past month she is getting SOB walking short distances around the house. No chest pain or edema. No cough. She did drive to [place] several weeks ago.   Past Medical History  Diagnosis Date  . Paroxysmal atrial fibrillation   . Diverticulosis   . Hiatal hernia   . Hyperlipidemia   . Hypertension   . Chronic insomnia   . Sleep apnea   . Restless leg syndrome   . Hx of adenomatous colonic polyps   . Internal hemorrhoids   . Carotid bruit   . Fatty liver   . Colon polyps   . Pure hypercholesterolemia   . Special screening for malignant neoplasms, colon   . Routine general medical examination at a health care facility   . Other malaise and fatigue   . Screening for lipoid disorders   . Other acute sinusitis   . Acute upper respiratory infections of unspecified site   . Menopause     noticed in early 37's  . Insomnia, controlled   . Insomnia, controlled 01/01/2014    Current Outpatient Prescriptions  Medication Sig Dispense Refill  . losartan (COZAAR) 100 MG tablet Take 0.5 tablets (50 mg total) by mouth daily.  15 tablet  11  . metoprolol succinate (TOPROL-XL) 50 MG 24 hr tablet TAKE 1 TABLET (50 MG TOTAL) BY MOUTH DAILY. TAKE WITH OR IMMEDIATELY FOLLOWING A MEAL.  30 tablet  6  . propafenone (RYTHMOL) 300 MG tablet Take 1 tablet (300 mg total) by mouth 2 (two) times daily.  60 tablet  6  . topiramate (TOPAMAX) 25 MG tablet Take 1  tablet (25 mg total) by mouth daily. Take one in PM , 1-2  hours before bedtime. Migraine Prophylaxis.  90 tablet  3  . warfarin (COUMADIN) 5 MG tablet TAKE AS DIRECTED BY ANTICOAGULATION CLINIC  90 tablet  1  . zolpidem (AMBIEN) 10 MG tablet TAKE 1 TABLET BY MOUTH AT BEDTIME AS NEEDED FOR SLEEP  30 tablet  5   No current facility-administered medications for this visit.    Allergies: Allergies  Allergen Reactions  . Codeine     REACTION: vomiting   Review of systems: As noted in history of present illness. All other review of systems are reviewed and are negative.  Vital Signs: BP 151/86  Pulse 55  Ht 5\' 3"  (1.6 m)  Wt 197 lb 3.2 oz (89.449 kg)  BMI 34.94 kg/m2  PHYSICAL EXAM: Well nourished, obese, in no acute distress HEENT: normal Neck: no JVD or bruits Cardiac:  normal S1, S2; RRR; no murmur Lungs:  clear to auscultation bilaterally, no wheezing, rhonchi or rales Abd: soft, nontender, no hepatomegaly Ext: no edema Skin: warm and dry Neuro:  CNs 2-12 intact, no focal abnormalities noted  Laboratory data: ECG today demonstrates sinus rhythm with a rate of 70 beats per minute. First degree AV block. It is otherwise normal.I have personally reviewed and interpreted this study.  Event monitor was personally  reviewed and showed 2 episodes of Afib on 08/18/14. The longest lasted 10 minutes. HR controlled.   ASSESSMENT AND PLAN:  1. Paroxysmal atrial fibrillation. This appears to be  well controlled on increased  Rythmol. She is on chronic anticoagulation with Coumadin. We will continue her current therapy. Will check TSH.   2. Dyspnea on exertion. Clearly very limiting now. Need to consider coronary ischemia, LF function, ?PE. Will check d-dimer and BNP. Schedule for Echo and stress Myoview. If she has CAD or LV dysfunction would need to consider alternate antiarrhythmic drug therapy.  2. Hypertension. Blood pressure is under fairly good control today. We will continue with  metoprolol and losartan.

## 2014-08-28 NOTE — Patient Instructions (Signed)
We will do some additional blood work today.  We will schedule you for a nuclear stress test and an Echocardiogram.  Continue your current medication.

## 2014-08-29 ENCOUNTER — Encounter: Payer: Self-pay | Admitting: Cardiology

## 2014-08-29 LAB — BRAIN NATRIURETIC PEPTIDE: Brain Natriuretic Peptide: 32 pg/mL (ref 0.0–100.0)

## 2014-08-29 LAB — D-DIMER, QUANTITATIVE: D-Dimer, Quant: 0.35 ug/mL-FEU (ref 0.00–0.48)

## 2014-09-05 ENCOUNTER — Telehealth (HOSPITAL_COMMUNITY): Payer: Self-pay

## 2014-09-05 NOTE — Telephone Encounter (Signed)
Encounter complete. 

## 2014-09-09 ENCOUNTER — Telehealth (HOSPITAL_COMMUNITY): Payer: Self-pay

## 2014-09-09 NOTE — Telephone Encounter (Signed)
Encounter complete. 

## 2014-09-10 ENCOUNTER — Ambulatory Visit (HOSPITAL_BASED_OUTPATIENT_CLINIC_OR_DEPARTMENT_OTHER)
Admission: RE | Admit: 2014-09-10 | Discharge: 2014-09-10 | Disposition: A | Discharge status: Home / Self Care | Payer: Medicare Other | Source: Ambulatory Visit | Attending: Cardiology | Admitting: Cardiology

## 2014-09-10 ENCOUNTER — Ambulatory Visit (HOSPITAL_COMMUNITY)
Admission: RE | Admit: 2014-09-10 | Discharge: 2014-09-10 | Disposition: A | Discharge status: Home / Self Care | Payer: Medicare Other | Source: Ambulatory Visit | Attending: Cardiovascular Disease | Admitting: Cardiovascular Disease

## 2014-09-10 DIAGNOSIS — R0602 Shortness of breath: Secondary | ICD-10-CM

## 2014-09-10 DIAGNOSIS — R42 Dizziness and giddiness: Secondary | ICD-10-CM | POA: Insufficient documentation

## 2014-09-10 DIAGNOSIS — I059 Rheumatic mitral valve disease, unspecified: Secondary | ICD-10-CM

## 2014-09-10 DIAGNOSIS — R002 Palpitations: Secondary | ICD-10-CM | POA: Diagnosis not present

## 2014-09-10 DIAGNOSIS — I272 Pulmonary hypertension, unspecified: Secondary | ICD-10-CM

## 2014-09-10 DIAGNOSIS — Z8249 Family history of ischemic heart disease and other diseases of the circulatory system: Secondary | ICD-10-CM | POA: Diagnosis not present

## 2014-09-10 DIAGNOSIS — I10 Essential (primary) hypertension: Secondary | ICD-10-CM

## 2014-09-10 DIAGNOSIS — Z7901 Long term (current) use of anticoagulants: Secondary | ICD-10-CM

## 2014-09-10 DIAGNOSIS — E7800 Pure hypercholesterolemia, unspecified: Secondary | ICD-10-CM

## 2014-09-10 DIAGNOSIS — R0600 Dyspnea, unspecified: Secondary | ICD-10-CM | POA: Insufficient documentation

## 2014-09-10 DIAGNOSIS — I480 Paroxysmal atrial fibrillation: Secondary | ICD-10-CM

## 2014-09-10 DIAGNOSIS — I340 Nonrheumatic mitral (valve) insufficiency: Secondary | ICD-10-CM

## 2014-09-10 HISTORY — DX: Pulmonary hypertension, unspecified: I27.20

## 2014-09-10 HISTORY — DX: Nonrheumatic mitral (valve) insufficiency: I34.0

## 2014-09-10 MED ORDER — TECHNETIUM TC 99M SESTAMIBI GENERIC - CARDIOLITE
10.0000 | Freq: Once | INTRAVENOUS | Status: AC | PRN
  Administered 2014-09-10: 10 via INTRAVENOUS

## 2014-09-10 MED ORDER — TECHNETIUM TC 99M SESTAMIBI GENERIC - CARDIOLITE
30.0000 | Freq: Once | INTRAVENOUS | Status: AC | PRN
  Administered 2014-09-10: 30 via INTRAVENOUS

## 2014-09-10 NOTE — Procedures (Addendum)
[facility] [address] [place] [address] [phone]  Cardiology Nuclear Med Study  [name] is a 68 y.o. female     MRN : [id number]     DOB: 1946/02/10  Procedure Date: 09/10/2014  Nuclear Med Background Indication for Stress Test:  Evaluation for Ischemia and Abnormal EKG History:  PAF;Carotid bruit;carotid stenosis;DVT;No prior NUC MPI for comparison;ECHO on 01/28/2011-negative for ischemia. Cardiac Risk Factors: Family History - CAD, Hypertension, Lipids and Obesity  Symptoms:  Dizziness, DOE, Fatigue, Light-Headedness, Near Syncope and Palpitations   Nuclear Pre-Procedure Caffeine/Decaff Intake:  9:30pm NPO After: 7:30am   IV Site: R Forearm  IV 0.9% NS with Angio Cath:  22g  Chest Size (in):  n/a IV Started by: Rolene Course, RN  Height: 5\' 3"  (1.6 m)  Cup Size: C  BMI:  Body mass index is 34.91 kg/(m^2). Weight:  197 lb (89.359 kg)   Tech Comments:  n/a    Nuclear Med Study 1 or 2 day study: 1 day  Stress Test Type:  Stress  Order Authorizing Provider:  Peter Martinique, MD   Resting Radionuclide: Technetium 44m Sestamibi  Resting Radionuclide Dose: 10.5 mCi   Stress Radionuclide:  Technetium 54m Sestamibi  Stress Radionuclide Dose: 29.8 mCi           Stress Protocol Rest HR: 63 Stress HR: 125  Rest BP: 150/83 Stress BP: 186/75  Exercise Time (min): 6:41 METS: 8.00   Predicted Max HR: 150 bpm % Max HR: 83.33 bpm Rate Pressure Product: 23250  Dose of Adenosine (mg):  n/a Dose of Lexiscan: n/a mg  Dose of Atropine (mg): n/a Dose of Dobutamine: n/a mcg/kg/min (at max HR)  Stress Test Technologist: Mellody Memos, CCT Nuclear Technologist: Otho Perl, CNMT   Rest Procedure:  Myocardial perfusion imaging was performed at rest 45 minutes following the intravenous administration of Technetium 1m Sestamibi. Stress Procedure:  The patient performed treadmill exercise using a Bruce  Protocol for 6  minutes 41 seconds. The patient stopped due to shortness of breath and fatigue. Patient denied any chest pain.  There were no significant ST-T wave changes.  Technetium 21m Sestamibi was injected IV at peak exercise and myocardial perfusion imaging was performed after a brief delay.  Transient Ischemic Dilatation (Normal <1.22):  1.15 QGS EDV:  73  ml QGS ESV:  18 ml LV Ejection Fraction: 75%   Rest ECG: NSR - Normal EKG  Stress ECG: No significant change from baseline ECG  QPS Raw Data Images:  Normal; no motion artifact; normal heart/lung ratio. Stress Images:  Normal homogeneous uptake in all areas of the myocardium. Rest Images:  Normal homogeneous uptake in all areas of the myocardium. Subtraction (SDS):  No evidence of ischemia. LV Wall Motion:  NL LV Function; NL Wall Motion  Impression Exercise Capacity:  Good exercise capacity. BP Response:  Normal blood pressure response. Clinical Symptoms:  No significant symptoms noted. ECG Impression:  No significant ECG changes with Lexiscan. Comparison with Prior Nuclear Study: No previous nuclear study performed   Overall Impression:  Normal stress nuclear study.   Sanda Klein, MD  09/10/2014 12:15 PM

## 2014-09-10 NOTE — Progress Notes (Signed)
2D Echocardiogram Complete.  09/10/2014   [name], [name]

## 2014-09-16 ENCOUNTER — Ambulatory Visit (INDEPENDENT_AMBULATORY_CARE_PROVIDER_SITE_OTHER): Payer: Medicare Other | Admitting: Surgery

## 2014-09-16 DIAGNOSIS — I4891 Unspecified atrial fibrillation: Secondary | ICD-10-CM | POA: Diagnosis not present

## 2014-09-16 LAB — POCT INR: INR: 3.2

## 2014-09-30 ENCOUNTER — Ambulatory Visit (INDEPENDENT_AMBULATORY_CARE_PROVIDER_SITE_OTHER): Payer: Medicare Other | Admitting: *Deleted

## 2014-09-30 DIAGNOSIS — I4891 Unspecified atrial fibrillation: Secondary | ICD-10-CM | POA: Diagnosis not present

## 2014-09-30 LAB — POCT INR: INR: 3.1

## 2014-10-14 ENCOUNTER — Ambulatory Visit (INDEPENDENT_AMBULATORY_CARE_PROVIDER_SITE_OTHER): Payer: Medicare Other

## 2014-10-14 DIAGNOSIS — I4891 Unspecified atrial fibrillation: Secondary | ICD-10-CM

## 2014-10-14 LAB — POCT INR: INR: 2.3

## 2014-11-11 ENCOUNTER — Ambulatory Visit (INDEPENDENT_AMBULATORY_CARE_PROVIDER_SITE_OTHER): Payer: Medicare Other

## 2014-11-11 DIAGNOSIS — I4891 Unspecified atrial fibrillation: Secondary | ICD-10-CM | POA: Diagnosis not present

## 2014-11-11 LAB — POCT INR: INR: 2

## 2014-11-24 ENCOUNTER — Other Ambulatory Visit: Payer: Self-pay | Admitting: *Deleted

## 2014-11-24 MED ORDER — WARFARIN SODIUM 5 MG PO TABS: ORAL_TABLET | ORAL | Status: DC

## 2014-11-27 ENCOUNTER — Ambulatory Visit (INDEPENDENT_AMBULATORY_CARE_PROVIDER_SITE_OTHER): Payer: Medicare Other | Admitting: Cardiology

## 2014-11-27 ENCOUNTER — Encounter: Payer: Self-pay | Admitting: Cardiology

## 2014-11-27 VITALS — BP 156/81 | HR 58 | Ht 63.0 in | Wt 200.1 lb

## 2014-11-27 DIAGNOSIS — Z7901 Long term (current) use of anticoagulants: Secondary | ICD-10-CM | POA: Diagnosis not present

## 2014-11-27 DIAGNOSIS — I10 Essential (primary) hypertension: Secondary | ICD-10-CM | POA: Diagnosis not present

## 2014-11-27 DIAGNOSIS — I480 Paroxysmal atrial fibrillation: Secondary | ICD-10-CM

## 2014-11-27 NOTE — Patient Instructions (Signed)
Continue your current therapy  I will see you in 6 months.   

## 2014-11-27 NOTE — Progress Notes (Signed)
History of Present Illness: [name] is seen today to followup of atrial fibrillation and dyspnea. She has a history of paroxysmal atrial fibrillation managed with Rhythmol and metoprolol. She is on coumadin for anticoagulation. In October she complained of increased palpitations and her Rhythmol dose was increased with good response. She complained of increased dyspnea and had a normal stress Myoview. Echo showed normal LV function with mild MR and mild Pulmonary HTN. Tolerating medication well.  No chest pain or edema. No cough.   Past Medical History  Diagnosis Date  . Paroxysmal atrial fibrillation   . Diverticulosis   . Hiatal hernia   . Hyperlipidemia   . Hypertension   . Chronic insomnia   . Sleep apnea   . Restless leg syndrome   . Hx of adenomatous colonic polyps   . Internal hemorrhoids   . Carotid bruit   . Fatty liver   . Colon polyps   . Pure hypercholesterolemia   . Special screening for malignant neoplasms, colon   . Routine general medical examination at a health care facility   . Other malaise and fatigue   . Screening for lipoid disorders   . Other acute sinusitis   . Acute upper respiratory infections of unspecified site   . Menopause     noticed in early 32's  . Insomnia, controlled   . Insomnia, controlled 01/01/2014    Current Outpatient Prescriptions  Medication Sig Dispense Refill  . losartan (COZAAR) 100 MG tablet Take 0.5 tablets (50 mg total) by mouth daily. 15 tablet 11  . metoprolol succinate (TOPROL-XL) 50 MG 24 hr tablet TAKE 1 TABLET (50 MG TOTAL) BY MOUTH DAILY. TAKE WITH OR IMMEDIATELY FOLLOWING A MEAL. 30 tablet 6  . propafenone (RYTHMOL) 300 MG tablet Take 1 tablet (300 mg total) by mouth 2 (two) times daily. 60 tablet 6  . topiramate (TOPAMAX) 25 MG tablet Take 1 tablet (25 mg total) by mouth daily. Take one in PM , 1-2  hours before bedtime. Migraine Prophylaxis. 90 tablet 3  . warfarin (COUMADIN) 5 MG tablet TAKE AS DIRECTED BY  ANTICOAGULATION CLINIC 90 tablet 1  . zolpidem (AMBIEN) 10 MG tablet TAKE 1 TABLET BY MOUTH AT BEDTIME AS NEEDED FOR SLEEP 30 tablet 5   No current facility-administered medications for this visit.    Allergies: Allergies  Allergen Reactions  . Codeine     REACTION: vomiting   Review of systems: As noted in history of present illness. All other review of systems are reviewed and are negative.  Vital Signs: BP 156/81 mmHg  Pulse 58  Ht 5\' 3"  (1.6 m)  Wt 200 lb 1.6 oz (90.765 kg)  BMI 35.46 kg/m2  PHYSICAL EXAM: Well nourished, obese, in no acute distress HEENT: normal Neck: no JVD or bruits Cardiac:  normal S1, S2; RRR; no murmur Lungs:  clear to auscultation bilaterally, no wheezing, rhonchi or rales Abd: soft, nontender, no hepatomegaly Ext: no edema Skin: warm and dry Neuro:  CNs 2-12 intact, no focal abnormalities noted  Laboratory data: Echo: 09/10/14: Study Conclusions  - Left ventricle: The cavity size was normal. Wall thickness was normal. Systolic function was normal. The estimated ejection fraction was in the range of 55% to 65%. Wall motion was normal; there were no regional wall motion abnormalities. - Mitral valve: There was mild regurgitation. - Pulmonary arteries: Systolic pressure was mildly increased.  Cardiology Nuclear Med Study  [name] is a 69 y.o. female   MRN :  [id number]    DOB: 1946/09/14  Procedure Date: 09/10/2014  Nuclear Med Background Indication for Stress Test: Evaluation for Ischemia and Abnormal EKG History: PAF;Carotid bruit;carotid stenosis;DVT;No prior NUC MPI for comparison;ECHO on 01/28/2011-negative for ischemia. Cardiac Risk Factors: Family History - CAD, Hypertension, Lipids  and Obesity  Symptoms: Dizziness, DOE, Fatigue, Light-Headedness, Near  Syncope and Palpitations   Nuclear Pre-Procedure Caffeine/Decaff Intake: 9:30pm NPO After: 7:30am  IV Site: R Forearm IV 0.9% NS with Angio Cath:  22g  Chest Size (in): n/a IV Started by: Rolene Course, RN  Height: 5\' 3"  (1.6 m) Cup Size: C  BMI: Body mass index is 34.91 kg/(m^2). Weight: 197 lb (89.359  kg)  Tech Comments: n/a    Nuclear Med Study 1 or 2 day study: 1 day Stress Test Type: Stress  Order Authorizing Provider: Peter Martinique, MD  Resting Radionuclide: Technetium 13m Sestamibi Resting  Radionuclide Dose: 10.5 mCi  Stress Radionuclide: Technetium 8m Sestamibi Stress  Radionuclide Dose: 29.8 mCi      Stress Protocol Rest HR: 63 Stress HR: 125  Rest BP: 150/83 Stress BP: 186/75  Exercise Time (min): 6:41 METS: 8.00   Predicted Max HR: 150 bpm % Max HR: 83.33 bpm Rate Pressure Product: 23250  Dose of Adenosine (mg): n/a Dose of Lexiscan: n/a mg  Dose of Atropine (mg): n/a Dose of Dobutamine: n/a mcg/kg/min (at max HR)  Stress Test Technologist: Mellody Memos, CCT Nuclear  Technologist: Otho Perl, CNMT   Rest Procedure: Myocardial perfusion imaging was performed at  rest 45 minutes following the intravenous administration of  Technetium 48m Sestamibi. Stress Procedure: The patient performed treadmill exercise using a Bruce Protocol for 6 minutes 41 seconds. The patient stopped  due to shortness of breath and fatigue. Patient denied any chest  pain. There were no significant ST-T wave changes. Technetium  16m Sestamibi was injected IV at peak exercise and myocardial  perfusion imaging was performed after a brief delay.  Transient Ischemic Dilatation (Normal <1.22): 1.15 QGS EDV: 73 ml QGS ESV: 18 ml LV Ejection Fraction: 75%   Rest ECG: NSR - Normal EKG  Stress ECG: No significant change from baseline ECG  QPS Raw Data Images: Normal; no motion artifact; normal heart/lung  ratio. Stress Images: Normal homogeneous uptake in all areas of the  myocardium. Rest Images: Normal homogeneous uptake in all areas of the  myocardium. Subtraction (SDS): No evidence of  ischemia. LV Wall Motion: NL LV Function; NL Wall Motion  Impression Exercise Capacity: Good exercise capacity. BP Response: Normal blood pressure response. Clinical Symptoms: No significant symptoms noted. ECG Impression: No significant ECG changes with Lexiscan. Comparison with Prior Nuclear Study: No previous nuclear study  performed   Overall Impression: Normal stress nuclear study.   CROITORU,MIHAI, MD   ASSESSMENT AND PLAN:  1. Paroxysmal atrial fibrillation. This appears to be  well controlled on increased  Rythmol. She is on chronic anticoagulation with Coumadin. We will continue her current therapy. INR 2.0. Will follow up in 6 months with Ecg.  2. Dyspnea on exertion. Cardiac work up was negative. Symptoms improved. I think conditioning and weight are playing a major role. Recommend aerobic exercise and weight loss.  2. Hypertension. Blood pressure is under fairly good control today. We will continue with metoprolol and losartan.

## 2014-12-06 ENCOUNTER — Other Ambulatory Visit: Payer: Self-pay | Admitting: Neurology

## 2014-12-09 ENCOUNTER — Ambulatory Visit (INDEPENDENT_AMBULATORY_CARE_PROVIDER_SITE_OTHER): Payer: Medicare Other

## 2014-12-09 DIAGNOSIS — I4891 Unspecified atrial fibrillation: Secondary | ICD-10-CM

## 2014-12-09 LAB — POCT INR: INR: 1.8

## 2014-12-16 DIAGNOSIS — L820 Inflamed seborrheic keratosis: Secondary | ICD-10-CM | POA: Diagnosis not present

## 2014-12-16 DIAGNOSIS — D225 Melanocytic nevi of trunk: Secondary | ICD-10-CM | POA: Diagnosis not present

## 2014-12-16 DIAGNOSIS — X32XXXA Exposure to sunlight, initial encounter: Secondary | ICD-10-CM | POA: Diagnosis not present

## 2014-12-16 DIAGNOSIS — L570 Actinic keratosis: Secondary | ICD-10-CM | POA: Diagnosis not present

## 2014-12-29 DIAGNOSIS — H2513 Age-related nuclear cataract, bilateral: Secondary | ICD-10-CM | POA: Diagnosis not present

## 2014-12-29 DIAGNOSIS — H25013 Cortical age-related cataract, bilateral: Secondary | ICD-10-CM | POA: Diagnosis not present

## 2015-01-06 ENCOUNTER — Ambulatory Visit (INDEPENDENT_AMBULATORY_CARE_PROVIDER_SITE_OTHER): Payer: Medicare Other

## 2015-01-06 DIAGNOSIS — I4891 Unspecified atrial fibrillation: Secondary | ICD-10-CM

## 2015-01-06 LAB — POCT INR: INR: 2.7

## 2015-01-07 ENCOUNTER — Telehealth: Payer: Self-pay | Admitting: *Deleted

## 2015-01-07 DIAGNOSIS — H2511 Age-related nuclear cataract, right eye: Secondary | ICD-10-CM | POA: Diagnosis not present

## 2015-01-07 DIAGNOSIS — H25012 Cortical age-related cataract, left eye: Secondary | ICD-10-CM | POA: Diagnosis not present

## 2015-01-07 DIAGNOSIS — H35372 Puckering of macula, left eye: Secondary | ICD-10-CM | POA: Diagnosis not present

## 2015-01-07 DIAGNOSIS — H2512 Age-related nuclear cataract, left eye: Secondary | ICD-10-CM | POA: Diagnosis not present

## 2015-01-07 DIAGNOSIS — H35371 Puckering of macula, right eye: Secondary | ICD-10-CM | POA: Diagnosis not present

## 2015-01-07 MED ORDER — ZOLPIDEM TARTRATE 10 MG PO TABS: 10.0000 mg | ORAL_TABLET | Freq: Every evening | ORAL | Status: DC | PRN

## 2015-01-09 NOTE — Telephone Encounter (Signed)
Pt is calling back checking on Rx for zolpidem (AMBIEN) 10 MG tablet. Wants to know if it will be sent in today. Please call and advise.

## 2015-01-11 ENCOUNTER — Other Ambulatory Visit: Payer: Self-pay | Admitting: Neurology

## 2015-01-13 ENCOUNTER — Encounter: Payer: Self-pay | Admitting: Neurology

## 2015-01-13 ENCOUNTER — Ambulatory Visit (INDEPENDENT_AMBULATORY_CARE_PROVIDER_SITE_OTHER): Payer: Medicare Other | Admitting: Neurology

## 2015-01-13 ENCOUNTER — Telehealth: Payer: Self-pay

## 2015-01-13 VITALS — BP 202/85 | HR 61 | Resp 18 | Ht 63.0 in | Wt 201.4 lb

## 2015-01-13 DIAGNOSIS — R0902 Hypoxemia: Secondary | ICD-10-CM | POA: Diagnosis not present

## 2015-01-13 DIAGNOSIS — G4700 Insomnia, unspecified: Secondary | ICD-10-CM

## 2015-01-13 MED ORDER — ZOLPIDEM TARTRATE 10 MG PO TABS: 10.0000 mg | ORAL_TABLET | Freq: Every evening | ORAL | Status: DC | PRN

## 2015-01-13 NOTE — Patient Instructions (Signed)
Macular Pucker A macular pucker is scar tissue that has formed on the eye's macula. The macula is located in the center of the eye's light-sensitive tissue called the retina. The macula provides the sharp, central vision we need for reading, driving, and seeing fine detail. A macular pucker can cause blurred and distorted central vision. Most of the eye's interior is filled with a gel-like substance (vitreous) that fills about 80 percent of the eye. Vitreous helps the eye maintain a round shape. The vitreous contains millions of fine fibers that are attached to the surface of the retina. As we age, the vitreous slowly shrinks. This shrinkage causes the vitreous to pull away from the retinal surface. This is called a vitreous detachment, and it is normal. In most cases, there are no negative effects. It may cause a small increase in floaters, which are little "cobwebs" or specks. These seem to float about in your field of vision. Sometimes, when the vitreous pulls away from the retina, there is microscopic damage to the retina's surface. When this happens, the retina begins a healing process to the damaged area. This process forms scar tissue (epiretinal membrane). This scar tissue is firmly attached to the retina surface. When the scar tissue contracts (gets smaller), it causes the retina to wrinkle, or pucker, usually without any effect on central vision. If the scar tissue has formed over the macula, your sharp, central vision becomes blurred and distorted. For most people with macular pucker, vision remains stable and does not get increasingly worse. Usually macular pucker affects one eye, although it may affect the other eye later. Other names for macular pucker:  Epiretinal membrane.  Preretinal membrane.  Cellophane maculopathy.  Retina wrinkle.  Surface wrinkling retinopathy.  Premacular fibrosis.  Internal limiting membrane disease. CAUSES   Most macular puckers are related to vitreous  detachment. Macular pucker usually occurs in people over age 10. As you age, you are at increased risk for macular pucker.  A macular pucker can also be triggered by certain eye diseases and disorders. Detached retina and inflammation of the eye (uveitis) can cause macular pucker.  People with diabetes sometimes develop an eye disease called diabetic retinopathy. Diabetic retinopathy can cause a macular pucker.  A macular pucker can also be caused by trauma from surgery or an eye injury. SYMPTOMS   Vision loss from a macular pucker can vary from no loss to severe loss. Severe vision loss is uncommon.  People with a macular pucker may notice that their vision is blurry or mildly distorted, and straight lines can appear wavy.  You may have difficulty seeing fine detail and reading small print.  There may be a gray area in the center of your vision. There may be a blind spot. TREATMENT  In many cases, the symptoms of vision distortion and blurriness are mild. No treatment may be needed. People usually adjust to the mild visual distortion. Mild visual distortion does not affect activities of daily life, such as reading and driving. Eye drops, medicines, or nutritional supplements will not improve vision distorted from macular pucker. Sometimes the scar tissue, which causes a macular pucker, separates from the retina, and the macular pucker clears up. Surgery Rarely, vision grows worse, to the point that it affects daily activities. When vision gets worse, surgery may be recommended. This procedure is called a vitrectomy. In this procedure, the vitreous gel is removed. Removal prevents the gel from pulling on the retina. The vitreous is replaced with a salt solution.  You will notice no change between the salt solution and the normal vitreous. The scar tissue which causes the wrinkling is removed. A vitrectomy is usually performed under local anesthesia. After the operation, you will need to wear an  eye patch for a few days or weeks. The eye patch will protect the eye. You will also need to use medicated eye drops to protect against infection. Surgery to repair macular pucker is very delicate. Vision improves in most cases. It does not usually return to normal. About half of the vision lost from a macular pucker is restored. Some people have much more vision restored, some less. In most cases, vision distortion is reduced. Recovery of vision can take up to three months. You should talk with your caregiver about whether treatment is right for you. The most common complication of a vitrectomy is the development of acataract (clouded eye lens). Cataract surgery may be needed within a few months or years after the vitrectomy. Other, less common complications are:  Retinal detachment, either during or after surgery.  Infection.  The macular pucker may grow back (rare). Macular pucker and macular holes.  A macular pucker and a macular hole are different conditions, although they both result from the pulling on the retina, from a shrinking vitreous.  When the "pulling" causes microscopic damage, the retina can heal itself. Scar tissue, or a macular pucker, can be the result.  If the shrinking vitreous pulls too hard, it can tear the retina, creating a macular hole, which is more serious.  Both conditions have similar symptoms: distorted and blurred vision.  A macular pucker will not "develop" into a macular hole.  An eye care specialist will know the difference. Macular pucker and age-related macular degeneration A macular pucker and age-related macular degeneration are two separate and distinct conditions, although the symptoms for each are similar. An eye care professional will know the difference. RESEARCH Please note that both of the procedures described below need additional clinical testing. We suggest you share this information with your caregiver.  Some caregivers are researching the  use of a surgical procedure, in which scar tissue is peeled off, without performing the vitrectomy.  Other caregivers are researching a new surgical technique to remove the internal limiting membrane (a layer of the retina) for patients with both macular pucker and macular hole. This surgical technique is called Fluidic Internal Limiting Membrane Separation (FILMS). After a vitrectomy, fluid is injected between the membrane and the retina, which causes the membrane and the scar tissue to lift away. It is then removed with forceps (grasping tool). Document Released: 10/27/2003 Document Revised: 01/16/2012 Document Reviewed: 09/14/2009 [facility] Patient Information 2015 [place], [place]. This information is not intended to replace advice given to you by your health care provider. Make sure you discuss any questions you have with your health care provider.

## 2015-01-13 NOTE — Telephone Encounter (Signed)
Printed and faxed

## 2015-01-13 NOTE — Progress Notes (Signed)
[facility]  Provider:  Larey Seat, M D  Referring Provider: Lucille Passy, MD Primary Care Physician:  Arnette Norris, MD  Chief Complaint  Patient presents with  . RV cpap    Rm 10, alone    HPI:  [name] is a 69 y.o. female  Is seen here as a referral/ revisit  from Dr. Deborra Medina for chronic insomnia.    12-2013 Note, CD  The patient was last seen on  01-20-12  and has lost a lot of weight in the meantime.  After trazodone and Remeron did feel to produce satisfying results for now she will sometimes take a Benadryl or Tylenol PM to assist her with her sleep initiation. She thinks was a night was never the main problem. The patient states that she has lost a lot of weight intentionally and that this has helped certainly some of her other health issues including hypertension. She was diagnosed with atrial fibrillation in the past has been on chronic anticoagulation. She reported that topiramate caused her to be drowsy and daytime when taken in the morning for that reason we will change the intake time 2 PM this may assist with her sleepiness as well as was her headaches. It has also helped her to lose weight.  At bedtime routines were reviewed : the patient was to bed around 11 PM rises at 6:30 AM depending on how much sleep she got.  She did have amnestic spells from after some nights when using Ambien but she sleeps all much better on Ambien but on the other medications.  The onset of her insomnia was related to the development of menopause but she only has really hot flashes now.   Her bedroom is described as quiet, cool and dark she shares a bedroom with her husband.    Interval history , 01-13-15 CD,  [name] is seen here following up on her sleep study from 3-20 5-15 the study showed a patient with normal heart rate some prolonged oxygen desaturation and very mild apnea. A problem for the patient was that she couldn't really sleep through the night she was  53 minutes awake during her sleep study. 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 Dr. Zenia Resides had originally been referred to the patient for insomnia evaluation. Based on the hypoxemia and the mix of a mild sleep apnea , she could be invited the patient to come in for a CPAP titration split-night study.  The patient is beginning cataracts and she has just been seen by optometry, she has been insomnic again. Testing was suspected to reveal also retinal disease and she was referred to Dr. Zadie Rhine a retinal specialist. She has a macular pucker. Surgery is scheduled for tomorrow morning 01-14-15. She is in need of a refill for Ambien which he uses to treat her chronic insomnia. In spite of this the last week had been difficult for her I think because of anxiety in anticipation of the surgery. Her fatigue severity score is up to 40 points and her fatigue her Epworth sleepiness score is normal at 2 points.  Her sleep is not restorative, still. She will have an overnight pulse-oximetry after her surgery is done. I will schedule this though [facility].  Rv with NP or me in May-June 2016.    As to past medical history she see previous notes.    Review of Systems: Out of a complete 14 system review, the patient complains of only the following  symptoms, and all other reviewed systems are negative. The patient endorsed a runny nose, some hearing loss, some palpitations and intolerance of cold temperatures, restless legs and insomnia daytime sleepiness and snoring.  History   Social History  . Marital Status: Married    Spouse Name: Sterling Big  . Number of Children: 3  . Years of Education: College   Occupational History  . BOOK KEEPER    Social History Main Topics  . Smoking status: Never Smoker   . Smokeless tobacco: Never Used  . Alcohol Use: 0.6 oz/week    1 Glasses of wine per week     Comment: rarely  . Drug Use: No  . Sexual Activity: Not on file   Other Topics Concern  . Not on  file   Social History Narrative   Patient is married Sterling Big) and lives at home with her husband.   Patient has three adult children.   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    Patient is right-handed.   Patient drinks two cups of caffeine daily.   Patient has a college education.    Family History  Problem Relation Age of Onset  . Breast cancer Mother   . Heart disease Father   . Diabetes      cousins  . Colon cancer Neg Hx     Past Medical History  Diagnosis Date  . Paroxysmal atrial fibrillation   . Diverticulosis   . Hiatal hernia   . Hyperlipidemia   . Hypertension   . Chronic insomnia   . Sleep apnea   . Restless leg syndrome   . Hx of adenomatous colonic polyps   . Internal hemorrhoids   . Carotid bruit   . Fatty liver   . Colon polyps   . Pure hypercholesterolemia   . Special screening for malignant neoplasms, colon   . Routine general medical examination at a health care facility   . Other malaise and fatigue   . Screening for lipoid disorders   . Other acute sinusitis   . Acute upper respiratory infections of unspecified site   . Menopause     noticed in early 74's  . Insomnia, controlled   . Insomnia, controlled 01/01/2014  . Macular pucker, left eye     surgery planned 01-14-15  . Cataracts, both eyes     Past Surgical History  Procedure Laterality Date  . Tubal ligation    . Cholecystectomy    . Cardiovascular stress mri  12/20/06    afib  . Orif- l distal radius  9/07  . Cardiolyte neg  6/06  . Knee arthroscopy      left    Current Outpatient Prescriptions  Medication Sig Dispense Refill  . DUREZOL 0.05 % EMUL Place 1 drop into the left eye.   6  . losartan (COZAAR) 100 MG tablet Take 0.5 tablets (50 mg total) by mouth daily. 15 tablet 11  . metoprolol succinate (TOPROL-XL) 50 MG 24 hr tablet TAKE 1 TABLET (50 MG TOTAL) BY MOUTH DAILY. TAKE WITH OR IMMEDIATELY FOLLOWING A MEAL. 30 tablet 6  .  ofloxacin (OCUFLOX) 0.3 % ophthalmic solution Place 1 drop into the left eye.   6  . propafenone (RYTHMOL) 300 MG tablet Take 1 tablet (300 mg total) by mouth 2 (two) times daily. 60 tablet 6  . topiramate (TOPAMAX) 25 MG tablet TAKE 1 TABLET IN THE EVENING 1-2 HOURS BEFORE BEDTIME 30 tablet 0  .  warfarin (COUMADIN) 5 MG tablet TAKE AS DIRECTED BY ANTICOAGULATION CLINIC 90 tablet 1  . zolpidem (AMBIEN) 10 MG tablet Take 1 tablet (10 mg total) by mouth at bedtime as needed. for sleep 30 tablet 0   No current facility-administered medications for this visit.    Allergies as of 01/13/2015 - Review Complete 01/13/2015  Allergen Reaction Noted  . Codeine      Vitals: BP 202/85 mmHg  Pulse 61  Resp 18  Ht 5\' 3"  (1.6 m)  Wt 201 lb 6.4 oz (91.354 kg)  BMI 35.69 kg/m2 Last Weight:  Wt Readings from Last 1 Encounters:  01/13/15 201 lb 6.4 oz (91.354 kg)   Last Height:   Ht Readings from Last 1 Encounters:  01/13/15 5\' 3"  (1.6 m)    Physical exam:  General: The patient is awake, alert and appears not in acute distress. The patient is well groomed. Head: Normocephalic, atraumatic. Neck is supple. Mallampati 3 , neck [id number] ,no TMJ clicking.  Cardiovascular:  irregular rate and rhythm , without  murmurs or carotid bruit, and without distended neck veins. Respiratory: Lungs are clear to auscultation. Skin:  Without evidence of edema, or rash Trunk: BMI is  elevated , the patient  has normal posture.  Neurologic exam : The patient is awake and alert, oriented to place and time.  Memory subjective described as intact. There is a normal attention span & concentration ability. Speech is fluent without  dysarthria, dysphonia or aphasia. Mood and affect are appropriate.  Cranial nerves: Pupils are equal and briskly reactive to light.  Extraocular movements  in vertical and horizontal planes intact , she has left over right ptosis.  Visual fields by finger perimetry are  intact. Hearing to finger rub intact.  Facial sensation intact to fine touch.  Facial motor strength is symmetric and tongue and uvula move midline.  Motor exam:   Normal tone and normal muscle bulk and symmetric normal strength in all extremities.  Sensory:  Fine touch, pinprick and vibration were normal in all extremities. Proprioception is normal. She has right wrist carpal tunnel residual.  Assessment:  After physical and neurologic examination, review of laboratory studies, imaging, neurophysiology testing and pre-existing records, assessment is   1) likely Insomnia with OSA - patient reported reluctantly about her snoring and waking up with a dry mouth.  No morning headaches , no nocturia.  BMI is still elevated, atrial fibrillation.  No headaches on topiramate, which helped with weight loss, too.    Plan:  Treatment plan and additional workup : 1) refilled medications , including  Topamax.  3) patient failed Sonata, Remoron and Temazepam, and Trazodone.  I  Have Ambien refilled.

## 2015-01-13 NOTE — Telephone Encounter (Signed)
The patient indicates Zolpidem requires a prior auth.  Her prescription ins card is not scanned in the chart.  I called the pharmacy to obtain ins info.  Spoke with Gus.  He said the patient has coverage through [place] ID# [id number] or [id number].  Ins has been provided with all clinical info, request is currently under review Ref Key: [id number]

## 2015-01-14 DIAGNOSIS — H35372 Puckering of macula, left eye: Secondary | ICD-10-CM | POA: Diagnosis not present

## 2015-01-14 DIAGNOSIS — H547 Unspecified visual loss: Secondary | ICD-10-CM | POA: Diagnosis not present

## 2015-01-14 NOTE — Telephone Encounter (Signed)
Patient's policy is actually through [place].  [name] has approved there request for coverage on Zolpidem effective until 11/07/2015  ID # [id number] Ref Auth # [id number]

## 2015-01-14 NOTE — Telephone Encounter (Signed)
The clinic was supposed to be doing refills, however this one did not get completed by them.

## 2015-01-26 DIAGNOSIS — H35372 Puckering of macula, left eye: Secondary | ICD-10-CM | POA: Diagnosis not present

## 2015-02-10 ENCOUNTER — Ambulatory Visit (INDEPENDENT_AMBULATORY_CARE_PROVIDER_SITE_OTHER): Payer: Medicare Other | Admitting: *Deleted

## 2015-02-10 DIAGNOSIS — I4891 Unspecified atrial fibrillation: Secondary | ICD-10-CM

## 2015-02-10 DIAGNOSIS — Z5181 Encounter for therapeutic drug level monitoring: Secondary | ICD-10-CM | POA: Insufficient documentation

## 2015-02-10 DIAGNOSIS — I480 Paroxysmal atrial fibrillation: Secondary | ICD-10-CM | POA: Diagnosis not present

## 2015-02-10 LAB — POCT INR: INR: 2

## 2015-03-09 ENCOUNTER — Other Ambulatory Visit: Payer: Self-pay | Admitting: Neurology

## 2015-03-10 DIAGNOSIS — R092 Respiratory arrest: Secondary | ICD-10-CM | POA: Diagnosis not present

## 2015-03-10 DIAGNOSIS — G4709 Other insomnia: Secondary | ICD-10-CM | POA: Diagnosis not present

## 2015-03-10 DIAGNOSIS — R0902 Hypoxemia: Secondary | ICD-10-CM | POA: Diagnosis not present

## 2015-03-10 NOTE — Telephone Encounter (Signed)
RX signed and faxed.

## 2015-03-24 ENCOUNTER — Telehealth: Payer: Self-pay | Admitting: Neurology

## 2015-03-24 DIAGNOSIS — H35372 Puckering of macula, left eye: Secondary | ICD-10-CM | POA: Diagnosis not present

## 2015-03-24 DIAGNOSIS — Z09 Encounter for follow-up examination after completed treatment for conditions other than malignant neoplasm: Secondary | ICD-10-CM | POA: Diagnosis not present

## 2015-03-24 DIAGNOSIS — H2512 Age-related nuclear cataract, left eye: Secondary | ICD-10-CM | POA: Diagnosis not present

## 2015-03-24 NOTE — Telephone Encounter (Signed)
Returned pt's call, no answer, left message on machine to call me back,

## 2015-03-24 NOTE — Telephone Encounter (Signed)
Called both numbers and both went to voicemail. Left message on home number asking her to call me back.

## 2015-03-24 NOTE — Telephone Encounter (Signed)
Spoke with pt. She will call me back tomorrow with the DME company that performed her pulse oximetry test and I will contact them looking for results.

## 2015-03-24 NOTE — Telephone Encounter (Signed)
Patient called wanting to follow up on her result on her procedure she had done for her oxygen. Please call and advise patient can be reached @ [phone]

## 2015-03-24 NOTE — Telephone Encounter (Signed)
Patient is calling back.  She may be reached @[phone]) or [phone]).  Thanks!

## 2015-04-01 ENCOUNTER — Telehealth: Payer: Self-pay

## 2015-04-01 ENCOUNTER — Other Ambulatory Visit: Payer: Self-pay | Admitting: *Deleted

## 2015-04-01 MED ORDER — METOPROLOL SUCCINATE ER 50 MG PO TB24: ORAL_TABLET | ORAL | Status: DC

## 2015-04-01 NOTE — Telephone Encounter (Signed)
Following up on the question of what DME completed the ONO. No answer. Left message on home phone to please call me back.

## 2015-04-02 ENCOUNTER — Telehealth: Payer: Self-pay

## 2015-04-02 NOTE — Telephone Encounter (Signed)
Returned the pt's call. She said the DME was a company out of [place]. Researched DME companies with offices in [place]. Found that [employer] is one, called and left a message. Will continue to research. Informed pt that when we get the results of her test, we would contact her.

## 2015-04-02 NOTE — Telephone Encounter (Signed)
Called pt to give her results of ONO and make her an appt with Dr. Brett Fairy. No answer, left message on voicemail of cell phone asking her to please call me back.

## 2015-04-02 NOTE — Telephone Encounter (Signed)
Patient called/returning Krinstin's call from 04/01/15. Please call and advise. Patient can be reached @ [phone]

## 2015-04-03 NOTE — Telephone Encounter (Signed)
Attempted to call pt again. No answer. Left message on cell phone voicemail to please call me back.

## 2015-04-07 NOTE — Telephone Encounter (Signed)
Pt returned my call. Gave results of ONO and Dr. Edwena Felty request that she come in for an appt to start her on O2. Pt agreeable and we made an appt for her on 6/23 at 8:30. Informed her to arrive 15 minutes early. Pt verbalized understanding.

## 2015-04-15 DIAGNOSIS — H2512 Age-related nuclear cataract, left eye: Secondary | ICD-10-CM | POA: Diagnosis not present

## 2015-04-15 DIAGNOSIS — H25012 Cortical age-related cataract, left eye: Secondary | ICD-10-CM | POA: Diagnosis not present

## 2015-04-29 DIAGNOSIS — H25011 Cortical age-related cataract, right eye: Secondary | ICD-10-CM | POA: Diagnosis not present

## 2015-04-29 DIAGNOSIS — H2511 Age-related nuclear cataract, right eye: Secondary | ICD-10-CM | POA: Diagnosis not present

## 2015-04-30 ENCOUNTER — Ambulatory Visit: Payer: Self-pay | Admitting: Neurology

## 2015-05-15 ENCOUNTER — Encounter: Payer: Self-pay | Admitting: Internal Medicine

## 2015-05-15 ENCOUNTER — Ambulatory Visit: Payer: Medicare Other | Admitting: Neurology

## 2015-05-18 ENCOUNTER — Other Ambulatory Visit: Payer: Self-pay | Admitting: Internal Medicine

## 2015-05-25 ENCOUNTER — Other Ambulatory Visit: Payer: Self-pay

## 2015-05-25 MED ORDER — METOPROLOL SUCCINATE ER 50 MG PO TB24: ORAL_TABLET | ORAL | Status: DC

## 2015-06-04 ENCOUNTER — Ambulatory Visit (INDEPENDENT_AMBULATORY_CARE_PROVIDER_SITE_OTHER): Payer: Medicare Other | Admitting: Neurology

## 2015-06-04 ENCOUNTER — Encounter: Payer: Self-pay | Admitting: Neurology

## 2015-06-04 VITALS — BP 142/82 | HR 78 | Resp 20 | Ht 63.0 in | Wt 201.0 lb

## 2015-06-04 DIAGNOSIS — G4733 Obstructive sleep apnea (adult) (pediatric): Secondary | ICD-10-CM | POA: Diagnosis not present

## 2015-06-04 DIAGNOSIS — G4730 Sleep apnea, unspecified: Secondary | ICD-10-CM | POA: Diagnosis not present

## 2015-06-04 DIAGNOSIS — R0902 Hypoxemia: Secondary | ICD-10-CM | POA: Diagnosis not present

## 2015-06-04 DIAGNOSIS — G4700 Insomnia, unspecified: Secondary | ICD-10-CM | POA: Diagnosis not present

## 2015-06-04 MED ORDER — ZOLPIDEM TARTRATE 10 MG PO TABS: 10.0000 mg | ORAL_TABLET | Freq: Every evening | ORAL | Status: DC | PRN

## 2015-06-04 NOTE — Progress Notes (Signed)
[facility]  Provider:  Larey Seat, M D  Referring Provider: Lucille Passy, MD Primary Care Physician:  Arnette Norris, MD  Chief Complaint  Patient presents with  . Follow-up    sleep study results and ONO, pt does not want to wear cpap, rm 10, alone    HPI:  [name] is a 69 y.o. female  Is seen here as a referral/ revisit  from Dr. Deborra Medina for chronic insomnia. The patient will be evaluated for organic reasons of insomnia.     12-2013 Note, CD  The patient was last seen on  01-20-12  and has lost a lot of weight in the meantime.  After trazodone and Remeron did feel to produce satisfying results for now she will sometimes take a Benadryl or Tylenol PM to assist her with her sleep initiation. She thinks was a night was never the main problem. The patient states that she has lost a lot of weight intentionally and that this has helped certainly some of her other health issues including hypertension. She was diagnosed with atrial fibrillation in the past has been on chronic anticoagulation. She reported that topiramate caused her to be drowsy and daytime when taken in the morning for that reason we will change the intake time 2 PM this may assist with her sleepiness as well as was her headaches. It has also helped her to lose weight.  At bedtime routines were reviewed : the patient was to bed around 11 PM rises at 6:30 AM depending on how much sleep she got.  She did have amnestic spells from after some nights when using Ambien but she sleeps all much better on Ambien but on the other medications.  The onset of her insomnia was related to the development of menopause but she only has really hot flashes now.  Her bedroom is described as quiet, cool and dark she shares a bedroom with her husband.    Interval history , 01-13-15 CD,  [name] is seen here following up on her sleep study from 3-20 5-15 the study showed a patient with normal heart rate some prolonged  oxygen desaturation and very mild apnea. A problem for the patient was that she couldn't really sleep through the night she was 53 minutes awake during her sleep study. 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 Dr. Zenia Resides had originally been referred to the patient for insomnia evaluation. Based on the hypoxemia and the mix of a mild sleep apnea , she could be invited the patient to come in for a CPAP titration split-night study. The patient is beginning cataracts and she has just been seen by optometry, she has been insomnic again. Testing was suspected to reveal also retinal disease and she was referred to Dr. Zadie Rhine a retinal specialist. She has a macular pucker. Surgery is scheduled for tomorrow morning 01-14-15. She is in need of a refill for Ambien which he uses to treat her chronic insomnia. In spite of this the last week had been difficult for her I think because of anxiety in anticipation of the surgery. Her fatigue severity score is up to 40 points and her fatigue her Epworth sleepiness score is normal at 2 points.  Her sleep is not restorative, still. She will have an overnight pulse-oximetry after her surgery is done. I will schedule this though [facility].  Rv with NP or me in May-June 2016.  06-04-15, [name] is here status post cataract ectomy bilaterally and  surgery to the retina. As I had quoted in my last visit she underwent a sleep study on 3-20 5-15. She continued to complain of nonrestorative not refreshing sleep. She feels however that she has started to manage better with that and her fatigue is no longer as excessive. She endorsed the fatigue severity score of 31 points and the Epworth sleepiness score at 3 points. She's not likely to fall asleep unintended. She doesn't struggle with sleep attacks. In her geriatric depression score she endorsed only 1 point which is not indicative of suffering from clinical depression. She wears a fit bit, which records her pulse, she  wore a ONO, and had 2 hours of low oxygen levels, she will need a referral to pulmonology for further evaluation or will proceed with a CPAP auto-titration, to see if correcting the mild apnea is an option. She needs to lose weight.       Review of Systems: Out of a complete 14 system review, the patient complains of only the following symptoms, and all other reviewed systems are negative. The patient endorsed a runny nose, some hearing loss, some palpitations and intolerance of cold temperatures, restless legs and insomnia daytime sleepiness and snoring.  History   Social History  . Marital Status: Married    Spouse Name: Sterling Big  . Number of Children: 3  . Years of Education: College   Occupational History  . BOOK KEEPER    Social History Main Topics  . Smoking status: Never Smoker   . Smokeless tobacco: Never Used  . Alcohol Use: 0.6 oz/week    1 Glasses of wine per week     Comment: rarely  . Drug Use: No  . Sexual Activity: Not on file   Other Topics Concern  . Not on file   Social History Narrative   Patient is married Sterling Big) and lives at home with her husband.   Patient has three adult children.   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    Patient is right-handed.   Patient drinks two cups of caffeine daily.   Patient has a college education.    Family History  Problem Relation Age of Onset  . Breast cancer Mother   . Heart disease Father   . Diabetes      cousins  . Colon cancer Neg Hx     Past Medical History  Diagnosis Date  . Paroxysmal atrial fibrillation   . Diverticulosis   . Hiatal hernia   . Hyperlipidemia   . Hypertension   . Chronic insomnia   . Sleep apnea   . Restless leg syndrome   . Hx of adenomatous colonic polyps   . Internal hemorrhoids   . Carotid bruit   . Fatty liver   . Colon polyps   . Pure hypercholesterolemia   . Special screening for malignant neoplasms, colon   . Routine general  medical examination at a health care facility   . Other malaise and fatigue   . Screening for lipoid disorders   . Other acute sinusitis   . Acute upper respiratory infections of unspecified site   . Menopause     noticed in early 80's  . Insomnia, controlled   . Insomnia, controlled 01/01/2014  . Macular pucker, left eye     surgery planned 01-14-15  . Cataracts, both eyes     Past Surgical History  Procedure Laterality Date  . Tubal ligation    .  Cholecystectomy    . Cardiovascular stress mri  12/20/06    afib  . Orif- l distal radius  9/07  . Cardiolyte neg  6/06  . Knee arthroscopy      left    Current Outpatient Prescriptions  Medication Sig Dispense Refill  . losartan (COZAAR) 100 MG tablet Take 0.5 tablets (50 mg total) by mouth daily. 15 tablet 11  . metoprolol succinate (TOPROL-XL) 50 MG 24 hr tablet TAKE 1 TABLET (50 MG TOTAL) BY MOUTH DAILY. TAKE WITH OR IMMEDIATELY FOLLOWING A MEAL. 30 tablet 0  . propafenone (RYTHMOL) 300 MG tablet TAKE 1 TABLET (300 MG TOTAL) BY MOUTH 2 (TWO) TIMES DAILY. 60 tablet 1  . warfarin (COUMADIN) 5 MG tablet TAKE AS DIRECTED BY ANTICOAGULATION CLINIC 90 tablet 1  . zolpidem (AMBIEN) 10 MG tablet TAKE 1 TABLET BY MOUTH AT BEDTIME AS NEEDED SLEEP 30 tablet 5  . topiramate (TOPAMAX) 25 MG tablet TAKE 1 TABLET IN THE EVENING 1-2 HOURS BEFORE BEDTIME (Patient not taking: Reported on 06/04/2015) 90 tablet 3   No current facility-administered medications for this visit.    Allergies as of 06/04/2015 - Review Complete 06/04/2015  Allergen Reaction Noted  . Codeine      Vitals: BP 142/82 mmHg  Pulse 78  Resp 20  Ht 5\' 3"  (1.6 m)  Wt 201 lb (91.173 kg)  BMI 35.61 kg/m2 Last Weight:  Wt Readings from Last 1 Encounters:  06/04/15 201 lb (91.173 kg)   Last Height:   Ht Readings from Last 1 Encounters:  06/04/15 5\' 3"  (1.6 m)    Physical exam:  General: The patient is awake, alert and appears not in acute distress. The patient is well  groomed. Head: Normocephalic, atraumatic. Neck is supple. Mallampati 3 , neck [id number] ,no TMJ clicking.  Cardiovascular:  irregular rate and rhythm , without  murmurs or carotid bruit, and without distended neck veins. Respiratory: Lungs are clear to auscultation. Skin:  Without evidence of edema, or rash Trunk: BMI is elevated.  Neurologic exam : The patient is awake and alert, oriented to place and time.  Memory subjective described as intact. There is a normal attention span & concentration ability. Speech is fluent without  dysarthria, dysphonia or aphasia. Mood and affect are appropriate.  Cranial nerves: Pupils are equal and briskly reactive to light.  Extraocular movements  in vertical and horizontal planes intact , she has left over right ptosis.  Visual fields by finger perimetry are intact. Hearing to finger rub intact.  Facial sensation intact to fine touch.  Facial motor strength is symmetric and tongue and uvula move midline.  Motor exam:   Normal tone and normal muscle bulk and symmetric normal strength in all extremities.  Sensory:  Fine touch, pinprick and vibration were normal in all extremities. Proprioception is normal. She has right wrist carpal tunnel residual.  Assessment:  After physical and neurologic examination, review of laboratory studies, imaging, neurophysiology testing and pre-existing records, assessment is   1) likely Insomnia with OSA - patient reported reluctantly about her snoring and waking up with a dry mouth.  No morning headaches , no nocturia.   BMI is still elevated, atrial fibrillation.  No headaches on topiramate, which helped with weight loss, too.    Plan:  Treatment plan and additional workup : 1) ordered CPAP auto-titration to see effect on hypoxemia of sleep, suspect hypoventilation in relation to BMI.   2) patient failed Sonata, Remoron and Temazepam, and Trazodone.  I  have Ambien refilled.   3) referral to pulmonology.

## 2015-06-04 NOTE — Patient Instructions (Signed)

## 2015-06-09 ENCOUNTER — Ambulatory Visit (INDEPENDENT_AMBULATORY_CARE_PROVIDER_SITE_OTHER): Payer: Medicare Other | Admitting: Pharmacist

## 2015-06-09 VITALS — BP 168/82

## 2015-06-09 DIAGNOSIS — I480 Paroxysmal atrial fibrillation: Secondary | ICD-10-CM | POA: Diagnosis not present

## 2015-06-09 DIAGNOSIS — I4891 Unspecified atrial fibrillation: Secondary | ICD-10-CM | POA: Diagnosis not present

## 2015-06-09 DIAGNOSIS — Z5181 Encounter for therapeutic drug level monitoring: Secondary | ICD-10-CM

## 2015-06-09 LAB — POCT INR: INR: 5.2

## 2015-06-22 ENCOUNTER — Ambulatory Visit (INDEPENDENT_AMBULATORY_CARE_PROVIDER_SITE_OTHER): Payer: Medicare Other | Admitting: *Deleted

## 2015-06-22 DIAGNOSIS — Z5181 Encounter for therapeutic drug level monitoring: Secondary | ICD-10-CM

## 2015-06-22 DIAGNOSIS — I4891 Unspecified atrial fibrillation: Secondary | ICD-10-CM

## 2015-06-22 DIAGNOSIS — I480 Paroxysmal atrial fibrillation: Secondary | ICD-10-CM | POA: Diagnosis not present

## 2015-06-22 LAB — POCT INR: INR: 3.2

## 2015-06-23 ENCOUNTER — Other Ambulatory Visit: Payer: Self-pay | Admitting: *Deleted

## 2015-06-23 MED ORDER — METOPROLOL SUCCINATE ER 50 MG PO TB24: ORAL_TABLET | ORAL | Status: DC

## 2015-07-15 ENCOUNTER — Ambulatory Visit (INDEPENDENT_AMBULATORY_CARE_PROVIDER_SITE_OTHER): Payer: Medicare Other | Admitting: *Deleted

## 2015-07-15 DIAGNOSIS — I4891 Unspecified atrial fibrillation: Secondary | ICD-10-CM | POA: Diagnosis not present

## 2015-07-15 DIAGNOSIS — I480 Paroxysmal atrial fibrillation: Secondary | ICD-10-CM

## 2015-07-15 DIAGNOSIS — Z5181 Encounter for therapeutic drug level monitoring: Secondary | ICD-10-CM | POA: Diagnosis not present

## 2015-07-15 LAB — POCT INR: INR: 2.9

## 2015-07-16 ENCOUNTER — Telehealth: Payer: Self-pay | Admitting: Neurology

## 2015-07-16 NOTE — Telephone Encounter (Signed)
Patient called stating she has not received a message regarding getting a CPAP machine.  Please call and advise. Patient can be reached at [phone] and [phone]

## 2015-07-16 NOTE — Telephone Encounter (Signed)
Called patient and spoke to her and she is still waiting for CPAP to be approved. Patient will call [facility] Next week to see if everything has been processed. Psatient has number for [facility].

## 2015-07-19 ENCOUNTER — Other Ambulatory Visit: Payer: Self-pay | Admitting: Cardiology

## 2015-07-20 ENCOUNTER — Ambulatory Visit: Payer: Medicare Other | Admitting: Neurology

## 2015-07-24 ENCOUNTER — Other Ambulatory Visit: Payer: Self-pay | Admitting: *Deleted

## 2015-07-24 MED ORDER — METOPROLOL SUCCINATE ER 50 MG PO TB24: ORAL_TABLET | ORAL | Status: DC

## 2015-08-12 ENCOUNTER — Ambulatory Visit (INDEPENDENT_AMBULATORY_CARE_PROVIDER_SITE_OTHER): Payer: Medicare Other | Admitting: *Deleted

## 2015-08-12 DIAGNOSIS — Z5181 Encounter for therapeutic drug level monitoring: Secondary | ICD-10-CM | POA: Diagnosis not present

## 2015-08-12 DIAGNOSIS — I4891 Unspecified atrial fibrillation: Secondary | ICD-10-CM | POA: Diagnosis not present

## 2015-08-12 DIAGNOSIS — I480 Paroxysmal atrial fibrillation: Secondary | ICD-10-CM

## 2015-08-12 LAB — POCT INR: INR: 3.4

## 2015-08-17 ENCOUNTER — Other Ambulatory Visit: Payer: Self-pay | Admitting: Cardiology

## 2015-08-17 NOTE — Telephone Encounter (Signed)
REFILL 

## 2015-08-25 ENCOUNTER — Encounter: Payer: Self-pay | Admitting: Gastroenterology

## 2015-08-27 ENCOUNTER — Other Ambulatory Visit: Payer: Self-pay | Admitting: *Deleted

## 2015-08-27 MED ORDER — LOSARTAN POTASSIUM 100 MG PO TABS: 50.0000 mg | ORAL_TABLET | Freq: Every day | ORAL | Status: DC

## 2015-09-02 ENCOUNTER — Ambulatory Visit (INDEPENDENT_AMBULATORY_CARE_PROVIDER_SITE_OTHER): Payer: Medicare Other | Admitting: *Deleted

## 2015-09-02 DIAGNOSIS — Z5181 Encounter for therapeutic drug level monitoring: Secondary | ICD-10-CM | POA: Diagnosis not present

## 2015-09-02 DIAGNOSIS — I480 Paroxysmal atrial fibrillation: Secondary | ICD-10-CM

## 2015-09-02 DIAGNOSIS — I4891 Unspecified atrial fibrillation: Secondary | ICD-10-CM

## 2015-09-02 LAB — POCT INR: INR: 2.6

## 2015-09-14 DIAGNOSIS — Z961 Presence of intraocular lens: Secondary | ICD-10-CM | POA: Diagnosis not present

## 2015-09-22 DIAGNOSIS — H35372 Puckering of macula, left eye: Secondary | ICD-10-CM | POA: Diagnosis not present

## 2015-09-22 DIAGNOSIS — H35371 Puckering of macula, right eye: Secondary | ICD-10-CM | POA: Diagnosis not present

## 2015-09-22 DIAGNOSIS — H35352 Cystoid macular degeneration, left eye: Secondary | ICD-10-CM | POA: Diagnosis not present

## 2015-09-23 ENCOUNTER — Other Ambulatory Visit: Payer: Self-pay | Admitting: *Deleted

## 2015-09-23 MED ORDER — METOPROLOL SUCCINATE ER 50 MG PO TB24: ORAL_TABLET | ORAL | Status: DC

## 2015-09-24 ENCOUNTER — Encounter: Payer: Self-pay | Admitting: Cardiology

## 2015-09-24 ENCOUNTER — Ambulatory Visit (INDEPENDENT_AMBULATORY_CARE_PROVIDER_SITE_OTHER): Payer: Medicare Other | Admitting: Cardiology

## 2015-09-24 VITALS — BP 150/86 | HR 59 | Ht 63.0 in | Wt 200.1 lb

## 2015-09-24 DIAGNOSIS — E785 Hyperlipidemia, unspecified: Secondary | ICD-10-CM

## 2015-09-24 DIAGNOSIS — I480 Paroxysmal atrial fibrillation: Secondary | ICD-10-CM | POA: Diagnosis not present

## 2015-09-24 DIAGNOSIS — I4891 Unspecified atrial fibrillation: Secondary | ICD-10-CM | POA: Diagnosis not present

## 2015-09-24 LAB — CBC WITH DIFFERENTIAL/PLATELET
BASOS PCT: 1 % (ref 0–1)
Basophils Absolute: 0.1 10*3/uL (ref 0.0–0.1)
EOS ABS: 0.1 10*3/uL (ref 0.0–0.7)
Eosinophils Relative: 2 % (ref 0–5)
HCT: 39.8 % (ref 36.0–46.0)
HEMOGLOBIN: 13.6 g/dL (ref 12.0–15.0)
Lymphocytes Relative: 36 % (ref 12–46)
Lymphs Abs: 2.5 10*3/uL (ref 0.7–4.0)
MCH: 30.4 pg (ref 26.0–34.0)
MCHC: 34.2 g/dL (ref 30.0–36.0)
MCV: 89 fL (ref 78.0–100.0)
MONO ABS: 0.6 10*3/uL (ref 0.1–1.0)
MONOS PCT: 8 % (ref 3–12)
MPV: 10.2 fL (ref 8.6–12.4)
Neutro Abs: 3.7 10*3/uL (ref 1.7–7.7)
Neutrophils Relative %: 53 % (ref 43–77)
PLATELETS: 224 10*3/uL (ref 150–400)
RBC: 4.47 MIL/uL (ref 3.87–5.11)
RDW: 13.1 % (ref 11.5–15.5)
WBC: 7 10*3/uL (ref 4.0–10.5)

## 2015-09-24 LAB — BASIC METABOLIC PANEL
BUN: 18 mg/dL (ref 7–25)
CALCIUM: 8.6 mg/dL (ref 8.6–10.4)
CO2: 27 mmol/L (ref 20–31)
CREATININE: 0.88 mg/dL (ref 0.60–0.93)
Chloride: 105 mmol/L (ref 98–110)
GLUCOSE: 92 mg/dL (ref 65–99)
Potassium: 4.3 mmol/L (ref 3.5–5.3)
SODIUM: 139 mmol/L (ref 135–146)

## 2015-09-24 LAB — HEPATIC FUNCTION PANEL
ALK PHOS: 91 U/L (ref 33–130)
ALT: 22 U/L (ref 6–29)
AST: 17 U/L (ref 10–35)
Albumin: 4 g/dL (ref 3.6–5.1)
BILIRUBIN DIRECT: 0.1 mg/dL (ref <=0.2)
Indirect Bilirubin: 0.5 mg/dL (ref 0.2–1.2)
Total Bilirubin: 0.6 mg/dL (ref 0.2–1.2)
Total Protein: 6.4 g/dL (ref 6.1–8.1)

## 2015-09-24 LAB — LIPID PANEL
CHOL/HDL RATIO: 3.8 ratio (ref <=5.0)
CHOLESTEROL: 198 mg/dL (ref 125–200)
HDL: 52 mg/dL (ref >=46)
LDL Cholesterol: 121 mg/dL (ref <130)
Triglycerides: 127 mg/dL (ref <150)
VLDL: 25 mg/dL (ref <30)

## 2015-09-24 MED ORDER — LOSARTAN POTASSIUM 100 MG PO TABS: 100.0000 mg | ORAL_TABLET | Freq: Every day | ORAL | Status: DC

## 2015-09-24 MED ORDER — RIVAROXABAN 20 MG PO TABS: 20.0000 mg | ORAL_TABLET | Freq: Every day | ORAL | Status: DC

## 2015-09-24 NOTE — Progress Notes (Signed)
History of Present Illness: [name] is seen today to followup of atrial fibrillation and dyspnea. She has a history of paroxysmal atrial fibrillation managed with Rhythmol and metoprolol. She is on coumadin for anticoagulation. In October 2015 she complained of increased palpitations and her Rhythmol dose was increased with good response. She complained of increased dyspnea and had a normal stress Myoview. Echo showed normal LV function with mild MR and mild Pulmonary HTN.  On follow up today she is doing well. She is concerned about the variability of her INR checks. Her BP has been running high. She has infrequent episodes of AFib happening only a few times in the last year and longest 45 minutes.  Past Medical History  Diagnosis Date  . Paroxysmal atrial fibrillation (HCC)   . Diverticulosis   . Hiatal hernia   . Hyperlipidemia   . Hypertension   . Chronic insomnia   . Sleep apnea   . Restless leg syndrome   . Hx of adenomatous colonic polyps   . Internal hemorrhoids   . Carotid bruit   . Fatty liver   . Colon polyps   . Pure hypercholesterolemia   . Special screening for malignant neoplasms, colon   . Routine general medical examination at a health care facility   . Other malaise and fatigue   . Screening for lipoid disorders   . Other acute sinusitis   . Acute upper respiratory infections of unspecified site   . Menopause     noticed in early 72's  . Insomnia, controlled   . Insomnia, controlled 01/01/2014  . Macular pucker, left eye     surgery planned 01-14-15  . Cataracts, both eyes     Current Outpatient Prescriptions  Medication Sig Dispense Refill  . ketorolac (ACULAR) 0.5 % ophthalmic solution Place 0.5 drops into the left eye 4 (four) times daily.    Marland Kitchen losartan (COZAAR) 100 MG tablet Take 1 tablet (100 mg total) by mouth daily. 90 tablet 3  . metoprolol succinate (TOPROL-XL) 50 MG 24 hr tablet TAKE 1 TABLET (50 MG TOTAL) BY MOUTH DAILY. TAKE WITH OR  IMMEDIATELY FOLLOWING A MEAL. 30 tablet 2  . propafenone (RYTHMOL) 300 MG tablet TAKE 1 TABLET BY MOUTH 2 TIMES DAILY. 60 tablet 3  . topiramate (TOPAMAX) 25 MG tablet TAKE 1 TABLET IN THE EVENING 1-2 HOURS BEFORE BEDTIME 90 tablet 3  . zolpidem (AMBIEN) 10 MG tablet Take 1 tablet (10 mg total) by mouth at bedtime as needed for sleep. 90 tablet 1  . rivaroxaban (XARELTO) 20 MG TABS tablet Take 1 tablet (20 mg total) by mouth daily with supper. 90 tablet 3   No current facility-administered medications for this visit.    Allergies: Allergies  Allergen Reactions  . Codeine     REACTION: vomiting   Review of systems: As noted in history of present illness. All other review of systems are reviewed and are negative.  Vital Signs: BP 150/86 mmHg  Pulse 59  Ht 5\' 3"  (1.6 m)  Wt 90.765 kg (200 lb 1.6 oz)  BMI 35.46 kg/m2  PHYSICAL EXAM: Well nourished, obese, in no acute distress HEENT: normal Neck: no JVD or bruits Cardiac:  normal S1, S2; RRR; no murmur Lungs:  clear to auscultation bilaterally, no wheezing, rhonchi or rales Abd: soft, nontender, no hepatomegaly Ext: no edema Skin: warm and dry Neuro:  CNs 2-12 intact, no focal abnormalities noted  Laboratory data: Echo: 09/10/14: Study Conclusions  - Left ventricle: The  cavity size was normal. Wall thickness was normal. Systolic function was normal. The estimated ejection fraction was in the range of 55% to 65%. Wall motion was normal; there were no regional wall motion abnormalities. - Mitral valve: There was mild regurgitation. - Pulmonary arteries: Systolic pressure was mildly increased.  Ecg today shows NSR with rate 59. Normal Ecg. I have personally reviewed and interpreted this study.    ASSESSMENT AND PLAN:  1. Paroxysmal atrial fibrillation. This appears to be  well controlled on increased  Rythmol. She is on chronic anticoagulation with Coumadin with variable INRs. She is interested in switching to a NOAC.  Will transition to Xarelto 20 mg daily under pharmacy guidance.   2. Dyspnea on exertion. Improved from last year.   2. Hypertension. Blood pressure is elevated. We will continue with metoprolol but increase losartan to 100 mg daily  We will check chemistries, CBC and lipids today.

## 2015-09-24 NOTE — Patient Instructions (Signed)
Increase losartan to 100 mg daily  We will switch you from coumadin to Xarelto 20 mg daily  We will check blood work today

## 2015-09-28 ENCOUNTER — Ambulatory Visit (INDEPENDENT_AMBULATORY_CARE_PROVIDER_SITE_OTHER): Payer: Medicare Other | Admitting: *Deleted

## 2015-09-28 DIAGNOSIS — Z5181 Encounter for therapeutic drug level monitoring: Secondary | ICD-10-CM | POA: Diagnosis not present

## 2015-09-28 DIAGNOSIS — I4891 Unspecified atrial fibrillation: Secondary | ICD-10-CM | POA: Diagnosis not present

## 2015-09-28 DIAGNOSIS — I480 Paroxysmal atrial fibrillation: Secondary | ICD-10-CM

## 2015-09-28 LAB — POCT INR: INR: 3.2

## 2015-10-26 ENCOUNTER — Ambulatory Visit (INDEPENDENT_AMBULATORY_CARE_PROVIDER_SITE_OTHER): Payer: Medicare Other

## 2015-10-26 DIAGNOSIS — Z5181 Encounter for therapeutic drug level monitoring: Secondary | ICD-10-CM

## 2015-10-26 DIAGNOSIS — I4891 Unspecified atrial fibrillation: Secondary | ICD-10-CM | POA: Diagnosis not present

## 2015-10-26 LAB — BASIC METABOLIC PANEL
BUN: 18 mg/dL (ref 7–25)
CHLORIDE: 107 mmol/L (ref 98–110)
CO2: 25 mmol/L (ref 20–31)
CREATININE: 1.08 mg/dL — AB (ref 0.60–0.93)
Calcium: 8.7 mg/dL (ref 8.6–10.4)
Glucose, Bld: 126 mg/dL — ABNORMAL HIGH (ref 65–99)
POTASSIUM: 3.9 mmol/L (ref 3.5–5.3)
Sodium: 141 mmol/L (ref 135–146)

## 2015-10-26 LAB — CBC
HEMATOCRIT: 39.4 % (ref 36.0–46.0)
Hemoglobin: 13.8 g/dL (ref 12.0–15.0)
MCH: 30.9 pg (ref 26.0–34.0)
MCHC: 35 g/dL (ref 30.0–36.0)
MCV: 88.1 fL (ref 78.0–100.0)
MPV: 9.4 fL (ref 8.6–12.4)
Platelets: 183 10*3/uL (ref 150–400)
RBC: 4.47 MIL/uL (ref 3.87–5.11)
RDW: 13.1 % (ref 11.5–15.5)
WBC: 7.1 10*3/uL (ref 4.0–10.5)

## 2015-10-26 NOTE — Progress Notes (Signed)
Pt was started on Xarelto 20mg  QD for afib on 09/30/15 by Dr Martinique.    Reviewed patients medication list.  Pt is not currently on any combined P-gp and strong CYP3A4 inhibitors/inducers (ketoconazole, traconazole, ritonavir, carbamazepine, phenytoin, rifampin, St. John's wort).  Reviewed labs.  SCr 1.08, Weight 87kg, CrCl- 65.62.  Dose appropriate based on CrCl.   Hgb and HCT Within Normal Limits 13.8/39.4 on 10/26/15.  Called spoke with pt advised to continue on same dosage of Xarelto 20mg  QD.  Recheck CBC/CMP in 6 months appt made in clinic on 04/26/16.

## 2015-10-26 NOTE — Patient Instructions (Signed)

## 2015-10-29 ENCOUNTER — Encounter: Payer: Self-pay | Admitting: Gastroenterology

## 2015-10-29 ENCOUNTER — Ambulatory Visit (INDEPENDENT_AMBULATORY_CARE_PROVIDER_SITE_OTHER): Payer: Medicare Other | Admitting: Gastroenterology

## 2015-10-29 ENCOUNTER — Telehealth: Payer: Self-pay

## 2015-10-29 VITALS — BP 122/80 | HR 66 | Ht 63.0 in | Wt 197.6 lb

## 2015-10-29 DIAGNOSIS — Z86010 Personal history of colonic polyps: Secondary | ICD-10-CM | POA: Diagnosis not present

## 2015-10-29 DIAGNOSIS — K219 Gastro-esophageal reflux disease without esophagitis: Secondary | ICD-10-CM

## 2015-10-29 DIAGNOSIS — R1310 Dysphagia, unspecified: Secondary | ICD-10-CM | POA: Diagnosis not present

## 2015-10-29 DIAGNOSIS — Z7901 Long term (current) use of anticoagulants: Secondary | ICD-10-CM

## 2015-10-29 MED ORDER — NA SULFATE-K SULFATE-MG SULF 17.5-3.13-1.6 GM/177ML PO SOLN: ORAL | Status: DC

## 2015-10-29 NOTE — Progress Notes (Signed)
HPI :  69 y/o female seen in consultation for symptoms of dysphagia, and to discuss surveillance colonoscopy, as she has a history of tubular adenoma.   She has been having some dysphagia to solids, usually steak or fried foods cause symptoms. No dysphagia to liquds or soft foods. She feels food get hung up in the mid chest. She thinks this has been ongoing for several months. She will have this every time she eats any kind of meat or bulky foods. She does have some odynophagia when this occurs. No weight loss. No postprandial vomiting or abdominal pains. She does have some heartburn rarely. She is on zantac PRN which works well for her. She takes zantac maybe once per week or so for symptoms. No known FH of esophageal cancer. She had an EGD in 2009 for these symptoms and had a esophageal stricture, dilated up to 48Fr [facility].   She otherwise had a colonoscopy in 2011 with one adenoma removed. She denies any problems with her bowel habits. No diarrhea or constipation. No blood in the stools. No colon cancer known in the family. Due for surveillance colonoscopy at this time.   She is taking Xarelto for a history of paroxysmal atrial fibrillation. She has not had problems with bleeding while on this regimen.   EGD 2009 -22mm stricture, dilated with 48Fr [facility] Colonoscopy 2011 - one adenoma, 2 HPs   Past Medical History  Diagnosis Date  . Paroxysmal atrial fibrillation (HCC)   . Diverticulosis   . Hiatal hernia   . Hyperlipidemia   . Hypertension   . Chronic insomnia   . Sleep apnea   . Restless leg syndrome   . Hx of adenomatous colonic polyps   . Internal hemorrhoids   . Carotid bruit   . Fatty liver   . Colon polyps   . Pure hypercholesterolemia   . Special screening for malignant neoplasms, colon   . Routine general medical examination at a health care facility   . Other malaise and fatigue   . Screening for lipoid disorders   . Other acute sinusitis   . Acute upper  respiratory infections of unspecified site   . Menopause     noticed in early 27's  . Insomnia, controlled   . Insomnia, controlled 01/01/2014  . Macular pucker, left eye     surgery planned 01-14-15  . Cataracts, both eyes      Past Surgical History  Procedure Laterality Date  . Tubal ligation    . Cholecystectomy    . Cardiovascular stress mri  12/20/06    afib  . Orif- l distal radius  9/07  . Cardiolyte neg  6/06  . Knee arthroscopy      left   Family History  Problem Relation Age of Onset  . Breast cancer Mother   . Heart disease Father   . Diabetes      cousins  . Colon cancer Neg Hx    Social History  Substance Use Topics  . Smoking status: Never Smoker   . Smokeless tobacco: Never Used  . Alcohol Use: 0.6 oz/week    1 Glasses of wine per week     Comment: rarely   Current Outpatient Prescriptions  Medication Sig Dispense Refill  . ketorolac (ACULAR) 0.5 % ophthalmic solution Place 0.5 drops into the left eye 4 (four) times daily.    Marland Kitchen losartan (COZAAR) 100 MG tablet Take 1 tablet (100 mg total) by mouth daily. 90 tablet 3  .  metoprolol succinate (TOPROL-XL) 50 MG 24 hr tablet TAKE 1 TABLET (50 MG TOTAL) BY MOUTH DAILY. TAKE WITH OR IMMEDIATELY FOLLOWING A MEAL. 30 tablet 2  . propafenone (RYTHMOL) 300 MG tablet TAKE 1 TABLET BY MOUTH 2 TIMES DAILY. 60 tablet 3  . rivaroxaban (XARELTO) 20 MG TABS tablet Take 1 tablet (20 mg total) by mouth daily with supper. 90 tablet 3  . topiramate (TOPAMAX) 25 MG tablet TAKE 1 TABLET IN THE EVENING 1-2 HOURS BEFORE BEDTIME 90 tablet 3  . zolpidem (AMBIEN) 10 MG tablet Take 1 tablet (10 mg total) by mouth at bedtime as needed for sleep. 90 tablet 1   No current facility-administered medications for this visit.   Allergies  Allergen Reactions  . Codeine     REACTION: vomiting     Review of Systems: All systems reviewed and negative except where noted in HPI.   Lab Results  Component Value Date   WBC 7.1 10/26/2015    HGB 13.8 10/26/2015   HCT 39.4 10/26/2015   MCV 88.1 10/26/2015   PLT 183 10/26/2015    Lab Results  Component Value Date   CREATININE 1.08* 10/26/2015   BUN 18 10/26/2015   NA 141 10/26/2015   K 3.9 10/26/2015   CL 107 10/26/2015   CO2 25 10/26/2015    Lab Results  Component Value Date   ALT 22 09/24/2015   AST 17 09/24/2015   ALKPHOS 91 09/24/2015   BILITOT 0.6 09/24/2015     Physical Exam: BP 122/80 mmHg  Pulse 66  Ht 5\' 3"  (1.6 m)  Wt 197 lb 9.6 oz (89.631 kg)  BMI 35.01 kg/m2  SpO2 97% Constitutional: Pleasant,well-developed, female in no acute distress. HEENT: Normocephalic and atraumatic. Conjunctivae are normal. No scleral icterus. Neck supple.  Cardiovascular: Normal rate, regular rhythm.  Pulmonary/chest: Effort normal and breath sounds normal. No wheezing, rales or rhonchi. Abdominal: Soft, nondistended, nontender. Bowel sounds active throughout. There are no masses palpable. No hepatomegaly. Extremities: no edema Lymphadenopathy: No cervical adenopathy noted. Neurological: Alert and oriented to person place and time. Skin: Skin is warm and dry. No rashes noted. Psychiatric: Normal mood and affect. Behavior is normal.   ASSESSMENT AND PLAN: 69 y/o female with history of paroxysmal AFibb on Xarelto, here for symptoms of dysphagia and for a surveillance colonoscopy. Also with history of mild GERD.  Dysphagia - history as above, usually to solids, suspect related to benign stricture. She had a prior EGD for symptoms and had a dilation for benign esophageal stricture. Recommend an EGD to further evaluate and potentially treat with dilation. I discussed risks / benefits of EGD with her, and the need to hold Xarelto for this procedure, likely 3 days. She understands increased risk of CVA while holding Xarelto, and will need to obtain approval from the ordering provider to hold it prior to endoscopy. She will continue zantac PRN For GERD which is working well for  this symptom.   History of tubular adenoma in 2011, she is due for a surveillance colonoscopy. Risks / benefits of colonoscopy as discussed below, and she will need to hold Xarelto as outlined. She wishes to proceed.   The indications, risks, and benefits of EGD and colonoscopy were explained to the patient in detail. Risks include but are not limited to bleeding, perforation, adverse reaction to medications, and cardiopulmonary compromise. Sequelae include but are not limited to the possibility of surgery, hositalization, and mortality. The patient verbalized understanding and wished to proceed. All questions  answered, referred to scheduler and bowel prep ordered. Further recommendations pending results of the exam.   Cherokee Cellar, MD [facility] Pager [phone]  CC: Lucille Passy, MD

## 2015-10-29 NOTE — Telephone Encounter (Signed)
  10/29/2015   RE: [name] DOB: 1946/11/05 MRN: [id number]   Dear Dr. Collier Salina,    We have scheduled the above patient for an endoscopic procedure. Our records show that she is on anticoagulation therapy.   Please advise as to how long the patient may come off her therapy of Xarelto prior to the procedure, which is scheduled for 12/08/2015.  Please fax back/ or route the completed form to Jezabella Schriever at [phone].   Sincerely,    Margreta Journey

## 2015-10-29 NOTE — Patient Instructions (Signed)
You have been scheduled for an endoscopy and colonoscopy. Please follow the written instructions given to you at your visit today. Please pick up your prep supplies at the pharmacy within the next 1-3 days. If you use inhalers (even only as needed), please bring them with you on the day of your procedure. Your physician has requested that you go to www.startemmi.com and enter the access code given to you at your visit today. This web site gives a general overview about your procedure. However, you should still follow specific instructions given to you by our office regarding your preparation for the procedure.  We will contact you about holding Xarelto before your procedure. If you do not hear from our office one week before your procedure please call our office.

## 2015-10-30 NOTE — Telephone Encounter (Signed)
Called pt to inform her she is to hold [place] but phone kept ringing. Will call back.

## 2015-10-30 NOTE — Telephone Encounter (Signed)
She may hold Xarelto for 48 hours prior to endoscopy.  Peter Martinique MD, [facility]

## 2015-11-04 ENCOUNTER — Telehealth: Payer: Self-pay

## 2015-11-04 MED ORDER — PANTOPRAZOLE SODIUM 20 MG PO TBEC: 20.0000 mg | DELAYED_RELEASE_TABLET | Freq: Every day | ORAL | Status: DC

## 2015-11-04 NOTE — Telephone Encounter (Signed)
Called pt. Voicemail not set up. Will call back.

## 2015-11-04 NOTE — Telephone Encounter (Signed)
Pt had procedure with you on 10/29/2015. Pt is requesting a refill on Protonix 20mg  BID. This has not been filled since July. Is it ok to refill?

## 2015-11-04 NOTE — Telephone Encounter (Signed)
Yes that's okay. Thanks  

## 2015-11-05 ENCOUNTER — Encounter: Payer: Self-pay | Admitting: *Deleted

## 2015-11-05 NOTE — Telephone Encounter (Signed)
Called pt and informed her to hold Xarelto 2 days before her procedure. Patient understands.

## 2015-11-13 ENCOUNTER — Ambulatory Visit: Payer: Medicare Other | Admitting: Cardiology

## 2015-12-08 ENCOUNTER — Encounter: Payer: Self-pay | Admitting: Gastroenterology

## 2015-12-08 ENCOUNTER — Ambulatory Visit (AMBULATORY_SURGERY_CENTER): Payer: Medicare Other | Admitting: Gastroenterology

## 2015-12-08 VITALS — BP 184/97 | HR 65 | Temp 97.6°F | Resp 16 | Ht 63.0 in | Wt 197.0 lb

## 2015-12-08 DIAGNOSIS — Z86010 Personal history of colonic polyps: Secondary | ICD-10-CM | POA: Diagnosis not present

## 2015-12-08 DIAGNOSIS — K209 Esophagitis, unspecified: Secondary | ICD-10-CM | POA: Diagnosis not present

## 2015-12-08 DIAGNOSIS — D120 Benign neoplasm of cecum: Secondary | ICD-10-CM | POA: Diagnosis not present

## 2015-12-08 DIAGNOSIS — I4891 Unspecified atrial fibrillation: Secondary | ICD-10-CM | POA: Diagnosis not present

## 2015-12-08 DIAGNOSIS — G4730 Sleep apnea, unspecified: Secondary | ICD-10-CM | POA: Diagnosis not present

## 2015-12-08 DIAGNOSIS — D123 Benign neoplasm of transverse colon: Secondary | ICD-10-CM

## 2015-12-08 DIAGNOSIS — R1310 Dysphagia, unspecified: Secondary | ICD-10-CM

## 2015-12-08 DIAGNOSIS — D122 Benign neoplasm of ascending colon: Secondary | ICD-10-CM | POA: Diagnosis not present

## 2015-12-08 DIAGNOSIS — I10 Essential (primary) hypertension: Secondary | ICD-10-CM | POA: Diagnosis not present

## 2015-12-08 DIAGNOSIS — K222 Esophageal obstruction: Secondary | ICD-10-CM | POA: Diagnosis not present

## 2015-12-08 MED ORDER — SODIUM CHLORIDE 0.9 % IV SOLN: 500.0000 mL | INTRAVENOUS | Status: DC

## 2015-12-08 MED ORDER — OMEPRAZOLE 20 MG PO CPDR: 20.0000 mg | DELAYED_RELEASE_CAPSULE | Freq: Every day | ORAL | Status: DC

## 2015-12-08 NOTE — Progress Notes (Signed)
TO Pacu-Pt awake and alert, coughing, lungs clear, O2 sat 99%, Report to RN

## 2015-12-08 NOTE — Progress Notes (Signed)
Called to room to assist during endoscopic procedure.  Patient ID and intended procedure confirmed with present staff. Received instructions for my participation in the procedure from the performing physician.  

## 2015-12-08 NOTE — Op Note (Signed)
[place]  [address] [place], [address]   COLONOSCOPY PROCEDURE REPORT  PATIENT: [name], [name]  MR#: [id number] BIRTHDATE: [birth year] , 23  yrs. old GENDER: female ENDOSCOPIST: Yetta Flock, MD REFERRED BY: Arnette Norris MD PROCEDURE DATE:  12/08/2015 PROCEDURE:   Colonoscopy, surveillance and Colonoscopy with snare polypectomy First Screening Colonoscopy - Avg.  risk and is 50 yrs.  old or older - No.      History of Adenoma - Now for follow-up colonoscopy & has been > or = to 3 yrs.  Yes hx of adenoma.  Has been 3 or more years since last colonoscopy.  Polyps removed today? Yes ASA CLASS:   Class III INDICATIONS:Surveillance due to prior colonic neoplasia and Colorectal Neoplasm Risk Assessment for this procedure is average risk. MEDICATIONS: Propofol 150 mg IV  DESCRIPTION OF PROCEDURE:   After the risks benefits and alternatives of the procedure were thoroughly explained, informed consent was obtained.  The digital rectal exam revealed no abnormalities of the rectum.   The LB [id number]  endoscope was introduced through the anus and advanced to the cecum, which was identified by both the appendix and ileocecal valve. No adverse events experienced.   The quality of the prep was fair.  The instrument was then slowly withdrawn as the colon was fully examined. Estimated blood loss is zero unless otherwise noted in this procedure report.   COLON FINDINGS: The bowel preparation was only fair.  There was residual seeds / nuts throughout the colon which intermittently clogged the endoscope.  Parts of the ascending colon and dependant portions of the colon had some residual stool which could not be cleared.  There was severe diverticulosis in the left colon with narrowed lumen and restricted mobility of the colon, as well as mild diverticulosis of the right colon.  The patient had some coughing and nasal regurgitation of gastric contents on  intubation of the colon, which was paused for several minutes while anesthesia attended to the patient.  This resolved and the procedure resumed. There was a 8-61mm sessile polyp in the cecum which was removed with cold snare.  A 3-74mm sessile polyp was noted in the cecum and removed with cold snare.  A nonbleeding small AVM was appreciated in the cecum.  A 62mm sessile polyp was noted in the distal ascending colon and removed with cold snare.  A 55mm sessile polyp was noted in the hepatic flexure and removed with cold snare.  No other polyps were noted during this exam however again due to the bowel prep, small or flat polyps may not have been appreciated in some portions of the colon.  Retroflexed views revealed internal hemorrhoids. The time to cecum = 11.1 Withdrawal time = 15.2   The scope was withdrawn and the procedure completed. COMPLICATIONS: There were no immediate complications.  ENDOSCOPIC IMPRESSION: Fair bowel prep as described above. 4 polyps removed via cold snare as outlined above Severe diverticulosis Internal hemorrhoids  RECOMMENDATIONS: Await pathology results Resume diet Resume Xarelto in 3 days Resume other medications  eSigned:  Yetta Flock, MD 12/08/2015 5:23 PM   cc: Arnette Norris MD, the patient   PATIENT NAME:  Alysea, Creasy MR#: [id number]

## 2015-12-08 NOTE — Op Note (Signed)
[place]  [address]. [place], [address]   ENDOSCOPY PROCEDURE REPORT  PATIENT: Emily, Choi  MR#: [id number] BIRTHDATE: 07-08-1946 , 65  yrs. old GENDER: female ENDOSCOPIST: Yetta Flock, MD REFERRED BY: PROCEDURE DATE:  12/08/2015 PROCEDURE:  EGD w/ balloon dilation and EGD w/ biopsy ASA CLASS:     Class III INDICATIONS:  dysphagia. MEDICATIONS: Propofol 200 mg IV TOPICAL ANESTHETIC:  DESCRIPTION OF PROCEDURE: After the risks benefits and alternatives of the procedure were thoroughly explained, informed consent was obtained.  The LB [id number] endoscope was introduced through the mouth and advanced to the second portion of the duodenum , Without limitations.  The instrument was slowly withdrawn as the mucosa was fully examined.  A short segment of esophagus a few cm in length in the proximal esophagus had a whitish appearance of mucosa sloughing off the esophagus, consistent with esophagitis dissecans superficialis. Biopsies were taken of this area to rule out Candida infection. There was otherwise a benign appearing Shatski ring at the GEJ which appeared mostly patent.  Given the patient's symptoms a TTS balloon was inflated from 16 to 48mm without any resistance nor heme noted following dilation.  Biopsies were taken of the stricture to ensure benign and open it up further.  DH noted at 35cm from the incisors, with SCJ and GEJ noted 33cm from the incisors, with a 2cm hiatal hernia.The stomach was normal in appearance.  The duodenal bulb and 2nd portion of the duodenum were normal.  Retroflexed views revealed a hiatal hernia.     The scope was then withdrawn from the patient and the procedure completed.  COMPLICATIONS: There were no immediate complications.  ENDOSCOPIC IMPRESSION: Short segment of proximal esophagus concerning for  esophagitis dissecans superficialis. Biopsies taken to rule out Candida 2cm hiatal hernia Benign  patent Shatski ring at the GEJ, dilated up to 11mm without mucosal wrent, biopsies obtained Normal appearing stomach Normal duodenum  RECOMMENDATIONS: Resume diet Resume medications Resume Xarelto in 3 days based on colonoscopy findings Await pathology results Start omeprazole 20mg  daily in light of findings to see if this helps symotoms of dysphagia    eSigned:  Yetta Flock, MD 12/08/2015 5:31 PM    CC: the patient  PATIENT NAME:  Emily, Choi MR#: [id number]

## 2015-12-08 NOTE — Progress Notes (Signed)
Pt. Coughed and projectile vomitted bile color secretions from nose. Colonoscopy procedure stopped and oral pharynx suctioned and clear. O2 Sat Maintained 95-99% throughout.

## 2015-12-08 NOTE — Patient Instructions (Signed)
YOU MAY RESTART YOUR XARELTO IN THREE DAYS, December 11, 2015.  NO NSAIDS FOR TWO WEEKS, December 23, 2015. 9EXAMPLE OF NSAIDS- MOTRIN, IBUPROFEN, ADVIL, NAPROSYN, ALEVE.    YOU HAD AN ENDOSCOPIC PROCEDURE TODAY AT [facility]:   Refer to the procedure report that was given to you for any specific questions about what was found during the examination.  If the procedure report does not answer your questions, please call your gastroenterologist to clarify.  If you requested that your care partner not be given the details of your procedure findings, then the procedure report has been included in a sealed envelope for you to review at your convenience later.  YOU SHOULD EXPECT: Some feelings of bloating in the abdomen. Passage of more gas than usual.  Walking can help get rid of the air that was put into your GI tract during the procedure and reduce the bloating. If you had a lower endoscopy (such as a colonoscopy or flexible sigmoidoscopy) you may notice spotting of blood in your stool or on the toilet paper. If you underwent a bowel prep for your procedure, you may not have a normal bowel movement for a few days.  Please Note:  You might notice some irritation and congestion in your nose or some drainage.  This is from the oxygen used during your procedure.  There is no need for concern and it should clear up in a day or so.  SYMPTOMS TO REPORT IMMEDIATELY:   Following lower endoscopy (colonoscopy or flexible sigmoidoscopy):  Excessive amounts of blood in the stool  Significant tenderness or worsening of abdominal pains  Swelling of the abdomen that is new, acute  Fever of 100F or higher   Following upper endoscopy (EGD)  Vomiting of blood or coffee ground material  New chest pain or pain under the shoulder blades  Painful or persistently difficult swallowing  New shortness of breath  Fever of 100F or higher  Black, tarry-looking stools  For urgent or emergent issues, a  gastroenterologist can be reached at any hour by calling [phone].   DIET: NOTHING TO EAT OR DRINK UNTIL 6:30. 6:30 UNTIL 7;30 ONLY CLEAR LIQUIDS. AFTER 7:30 ONLY SOFT FOODS UNTIL THE MORNING.  ACTIVITY:  You should plan to take it easy for the rest of today and you should NOT DRIVE or use heavy machinery until tomorrow (because of the sedation medicines used during the test).    FOLLOW UP: Our staff will call the number listed on your records the next business day following your procedure to check on you and address any questions or concerns that you may have regarding the information given to you following your procedure. If we do not reach you, we will leave a message.  However, if you are feeling well and you are not experiencing any problems, there is no need to return our call.  We will assume that you have returned to your regular daily activities without incident.  If any biopsies were taken you will be contacted by phone or by letter within the next 1-3 weeks.  Please call us at [phone] if you have not heard about the biopsies in 3 weeks.    SIGNATURES/CONFIDENTIALITY: You and/or your care partner have signed paperwork which will be entered into your electronic medical record.  These signatures attest to the fact that that the information above on your After Visit Summary has been reviewed and is understood.  Full responsibility of the confidentiality of this  discharge information lies with you and/or your care-partner. 

## 2015-12-08 NOTE — Progress Notes (Signed)
PATIENT CHEKED BY [facility] CRNA AND DR. ARMBRUSTER PRIOR TO DISCHARGE. CLEAR FOR DISCHARGE.

## 2015-12-09 ENCOUNTER — Telehealth: Payer: Self-pay

## 2015-12-09 NOTE — Telephone Encounter (Signed)
  Follow up Call-  Call back number 12/08/2015  Post procedure Call Back phone  # [phone]  Permission to leave phone message Yes     Patient questions:  Do you have a fever, pain , or abdominal swelling? No. Pain Score  0 *  Have you tolerated food without any problems? Yes.    Have you been able to return to your normal activities? Yes.    Do you have any questions about your discharge instructions: Diet   No. Medications  No. Follow up visit  No.  Do you have questions or concerns about your Care? No.  Actions: * If pain score is 4 or above: No action needed, pain <4.

## 2015-12-11 ENCOUNTER — Other Ambulatory Visit: Payer: Self-pay | Admitting: Neurology

## 2015-12-11 NOTE — Telephone Encounter (Signed)
Home phone invalid. LVM on mobile phone informing patient she was last seen 05/2015, has no FU scheduled. Requested she call back to let RN know if she does need refill, and if so, she needs to schedule FU with Dr Brett Fairy in order to get refill. Left office phone number.

## 2015-12-14 ENCOUNTER — Other Ambulatory Visit: Payer: Self-pay | Admitting: Neurology

## 2015-12-14 DIAGNOSIS — H35371 Puckering of macula, right eye: Secondary | ICD-10-CM | POA: Diagnosis not present

## 2015-12-14 NOTE — Telephone Encounter (Signed)
Patient returned [facility] call, "Home Ph# is correct, phone has been messing up", states she does need refill on zolpidem (AMBIEN) 10 MG tablet, states she had a bad night last night. Appointment scheduled for 12/31/15 1:30pm with Dr. Brett Fairy.

## 2015-12-19 ENCOUNTER — Other Ambulatory Visit: Payer: Self-pay | Admitting: Cardiology

## 2015-12-21 NOTE — Telephone Encounter (Signed)
Rx request sent to pharmacy.  

## 2015-12-31 ENCOUNTER — Telehealth: Payer: Self-pay | Admitting: Neurology

## 2015-12-31 ENCOUNTER — Ambulatory Visit (INDEPENDENT_AMBULATORY_CARE_PROVIDER_SITE_OTHER): Payer: Medicare Other | Admitting: Nurse Practitioner

## 2015-12-31 ENCOUNTER — Encounter: Payer: Self-pay | Admitting: Nurse Practitioner

## 2015-12-31 ENCOUNTER — Other Ambulatory Visit: Payer: Self-pay

## 2015-12-31 ENCOUNTER — Ambulatory Visit: Payer: Medicare Other | Admitting: Neurology

## 2015-12-31 VITALS — BP 158/71 | HR 70 | Ht 63.0 in | Wt 202.2 lb

## 2015-12-31 DIAGNOSIS — G4700 Insomnia, unspecified: Secondary | ICD-10-CM

## 2015-12-31 DIAGNOSIS — G4733 Obstructive sleep apnea (adult) (pediatric): Secondary | ICD-10-CM

## 2015-12-31 DIAGNOSIS — R0902 Hypoxemia: Secondary | ICD-10-CM | POA: Diagnosis not present

## 2015-12-31 DIAGNOSIS — G4730 Sleep apnea, unspecified: Secondary | ICD-10-CM | POA: Diagnosis not present

## 2015-12-31 MED ORDER — ZOLPIDEM TARTRATE 10 MG PO TABS: 10.0000 mg | ORAL_TABLET | Freq: Every evening | ORAL | Status: DC | PRN

## 2015-12-31 NOTE — Telephone Encounter (Signed)
Patient came in to be schedule for a CPAP Titration and I need an order please.

## 2015-12-31 NOTE — Progress Notes (Signed)
I have read the note, and I agree with the clinical assessment and plan.  Cathan Gearin KEITH   

## 2015-12-31 NOTE — Progress Notes (Signed)
[facility]  PATIENT: [name] DOB: [birth year]   REASON FOR VISIT: Follow-up for obstructive sleep apnea and insomnia HISTORY FROM: Patient    HISTORY OF PRESENT ILLNESS:[name] is a 70 y.o. female Is seen here as a referral/ revisit from [name] for chronic insomnia. The patient will be evaluated for organic reasons of insomnia.  Interval history , 01-13-15 CD, [name] is seen here following up on her sleep study from 3-20 5-15 the study showed a patient with normal heart rate some prolonged oxygen desaturation and very mild apnea. A problem for the patient was that she couldn't really sleep through the night she was 53 minutes awake during her sleep study. 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 [name] had originally been referred to the patient for insomnia evaluation. Based on the hypoxemia and the mix of a mild sleep apnea , she could be invited the patient to come in for a CPAP titration split-night study. The patient is beginning cataracts and she has just been seen by optometry, she has been insomnic again. Testing was suspected to reveal also retinal disease and she was referred to [name] a retinal specialist. She has a macular pucker. Surgery is scheduled for tomorrow morning 01-14-15. She is in need of a refill for Ambien which he uses to treat her chronic insomnia. In spite of this the last week had been difficult for her I think because of anxiety in anticipation of the surgery. Her fatigue severity score is up to 40 points and her fatigue her Epworth sleepiness score is normal at 2 points. Her sleep is not restorative, still. She will have an overnight pulse-oximetry after her surgery is done. I will schedule this though [facility]. Rv with [name] in May-June 2016.  06-04-15, [name] is here status post cataract ectomy bilaterally and surgery to the retina. As I had quoted in my last visit she underwent a sleep  study on 3-20 5-15. She continued to complain of nonrestorative not refreshing sleep. She feels however that she has started to manage better with that and her fatigue is no longer as excessive. She endorsed the fatigue severity score of 31 points and the Epworth sleepiness score at 3 points. She's not likely to fall asleep unintended. She doesn't struggle with sleep attacks. In her geriatric depression score she endorsed only 1 point which is not indicative of suffering from clinical depression. She wears a fit bit, which records her pulse, she wore a ONO, and had 2 hours of low oxygen levels, she will need a referral to pulmonology for further evaluation or will proceed with a CPAP auto-titration, to see if correcting the mild apnea is an option. She needs to lose weight.   UPDATE : 02/23/2017CM [name],  70 year old female returns for follow up. She was last seen in the office by Dr. Brett Choi 06/04/2015.  At that time because she had low oxygen levels on ONO  It was decided to proceed with CPAP auto titration to see if this would correct her mild apnea. However there was never an order placed  for CPAP titration .  She  also  has chronic insomnia  and is currently taking Ambien. She  revisits today to get a new prescription for Ambien. One month supply was called in earlier in the month.FSS today is 46 and ESS is 3.  She returns for reevaluation    REVIEW OF SYSTEMS: Full 14 system review  of systems performed and notable only for those listed, all others are neg:  Constitutional: neg  Cardiovascular: neg Ear/Nose/Throat: neg  Skin: neg Eyes: neg Respiratory: neg Gastroitestinal:  Urinary frequency Hematology/Lymphatic: neg  Endocrine: neg Musculoskeletal:n joint pain Allergy/Immunology: neg Neurological: neg Psychiatric: neg Sleep :  Insomnia , mild apnea   ALLERGIES: Allergies  Allergen Reactions  . Codeine     REACTION: vomiting    HOME MEDICATIONS: Outpatient Prescriptions  Prior to Visit  Medication Sig Dispense Refill  . ketorolac (ACULAR) 0.5 % ophthalmic solution Place 0.5 drops into the left eye 4 (four) times daily.    Marland Kitchen losartan (COZAAR) 100 MG tablet Take 1 tablet (100 mg total) by mouth daily. 90 tablet 3  . metoprolol succinate (TOPROL-XL) 50 MG 24 hr tablet TAKE 1 TABLET (50 MG TOTAL) BY MOUTH DAILY. TAKE WITH OR IMMEDIATELY FOLLOWING A MEAL. 30 tablet 2  . omeprazole (PRILOSEC) 20 MG capsule Take 1 capsule (20 mg total) by mouth daily. 90 capsule 0  . propafenone (RYTHMOL) 300 MG tablet TAKE 1 TABLET BY MOUTH 2 TIMES DAILY. 60 tablet 3  . rivaroxaban (XARELTO) 20 MG TABS tablet Take 1 tablet (20 mg total) by mouth daily with supper. 90 tablet 3  . topiramate (TOPAMAX) 25 MG tablet TAKE 1 TABLET IN THE EVENING 1-2 HOURS BEFORE BEDTIME 90 tablet 3  . zolpidem (AMBIEN) 10 MG tablet TAKE 1 TABLET BY MOUTH AT BEDTIME AS NEEDED FOR SLEEP 30 tablet 0  . pantoprazole (PROTONIX) 20 MG tablet Take 1 tablet (20 mg total) by mouth daily. (Patient not taking: Reported on 12/31/2015) 60 tablet 3   No facility-administered medications prior to visit.    PAST MEDICAL HISTORY: Past Medical History  Diagnosis Date  . Paroxysmal atrial fibrillation (HCC)   . Diverticulosis   . Hiatal hernia   . Hyperlipidemia   . Hypertension   . Chronic insomnia   . Sleep apnea   . Restless leg syndrome   . Hx of adenomatous colonic polyps   . Internal hemorrhoids   . Carotid bruit   . Fatty liver   . Colon polyps   . Pure hypercholesterolemia   . Special screening for malignant neoplasms, colon   . Routine general medical examination at a health care facility   . Other malaise and fatigue   . Screening for lipoid disorders   . Other acute sinusitis   . Acute upper respiratory infections of unspecified site   . Menopause     noticed in early 21's  . Insomnia, controlled   . Insomnia, controlled 01/01/2014  . Macular pucker, left eye     surgery planned 01-14-15  .  Cataracts, both eyes     PAST SURGICAL HISTORY: Past Surgical History  Procedure Laterality Date  . Tubal ligation    . Cholecystectomy    . Cardiovascular stress mri  12/20/06    afib  . Orif- l distal radius  9/07  . Cardiolyte neg  6/06  . Knee arthroscopy      left    FAMILY HISTORY: Family History  Problem Relation Age of Onset  . Breast cancer Mother   . Heart disease Father   . Diabetes      cousins  . Colon cancer Neg Hx     SOCIAL HISTORY: Social History   Social History  . Marital Status: Married    Spouse Name: Sterling Big  . Number of Children: 3  . Years of Education: [employer]  Occupational History  . BOOK KEEPER    Social History Main Topics  . Smoking status: Never Smoker   . Smokeless tobacco: Never Used  . Alcohol Use: 0.6 oz/week    1 Glasses of wine per week     Comment: rarely  . Drug Use: No  . Sexual Activity: Not on file   Other Topics Concern  . Not on file   Social History Narrative   Patient is married Sterling Big) and lives at home with her husband.   Patient has three adult children.   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    Patient is right-handed.   Patient drinks two cups of caffeine daily.   Patient has a college education.     PHYSICAL EXAM  Filed Vitals:   12/31/15 1430  BP: 158/71  Pulse: 70  Height: _0  (1.6 m)  Weight: 202 lb 3.2 oz (91.717 kg)   Body mass index is 35.83 kg/(m^2). General: The patient is awake, alert and appears not in acute distress. The patient is well groomed. Head: Normocephalic, atraumatic.  Neck is supple. Mallampati 3-4 , neck circumference:15  Cardiovascular: irregular rate and rhythm , without murmurs  Respiratory: Lungs are clear to auscultation. Skin: Without evidence of edema, or rash Trunk: BMI is elevated.  Neurologic exam : The patient is awake and alert, oriented to place and time. Memory subjective described as intact. There is a  normal attention span & concentration ability. Speech is fluent without dysarthria, dysphonia or aphasia. Mood and affect are appropriate.ESS 3 FSS 46 Cranial nerves:Pupils are equal and briskly reactive to light. Extraocular movements in vertical and horizontal planes intact Visual fields by finger perimetry are intact.Hearing to finger rub intact. Facial sensation intact to fine touch.Facial motor strength is symmetric and tongue and uvula move midline. Motor exam: Normal tone and normal muscle bulk and symmetric normal strength in all extremities. Sensory: Fine touch, pinprick and vibration were normal in all extremities. Proprioception is normal.   Gait ambulated 30 feet in the hall , no difficulty with turns,  Can heel toe and tandem without difficulty. Marland Kitchen DIAGNOSTIC DATA (LABS, IMAGING, TESTING) - I reviewed patient records, labs, notes, testing and imaging myself where available.  Lab Results  Component Value Date   WBC 7.1 10/26/2015   HGB 13.8 10/26/2015   HCT 39.4 10/26/2015   MCV 88.1 10/26/2015   PLT 183 10/26/2015      Component Value Date/Time   NA 141 10/26/2015 0944   K 3.9 10/26/2015 0944   CL 107 10/26/2015 0944   CO2 25 10/26/2015 0944   GLUCOSE 126* 10/26/2015 0944   BUN 18 10/26/2015 0944   CREATININE 1.08* 10/26/2015 0944   CREATININE 1.0 02/15/2012 1057   CALCIUM 8.7 10/26/2015 0944   PROT 6.4 09/24/2015 1214   ALBUMIN 4.0 09/24/2015 1214   AST 17 09/24/2015 1214   ALT 22 09/24/2015 1214   ALKPHOS 91 09/24/2015 1214   BILITOT 0.6 09/24/2015 1214   GFRNONAA 76.40 12/22/2009 0933   GFRAA  02/25/2009 1020    >60        The eGFR has been calculated using the MDRD equation. This calculation has not been validated in all clinical situations. eGFR's persistently <60 mL/min signify possible Chronic Kidney Disease.   Lab Results  Component Value Date   CHOL 198 09/24/2015   HDL 52 09/24/2015   LDLCALC 121 09/24/2015   LDLDIRECT 137.0 08/10/2010  TRIG 127 09/24/2015   CHOLHDL 3.8 09/24/2015    ASSESSMENT AND PLAN  70 y.o. year old female  has a past medical history of Chronic insomnia; Sleep apnea; Restless leg syndrome; here  To follow up. She has never had her CPAP auto titration. Since it has been so long she would like to discuss that with Dr. Brett Choi before  Proceeding.  She was written a prescription for Ambien.  She has failed Sonata Remeron temazepam and trazodone in the past.The patient is a current patient of Dr. Brett Choi  who is out of the office this afternoon.  This note is sent to the work in doctor.    Continue Ambien Rx to patient Please stop by the sleep lab on the way out Follow up with Dr. Brett Choi after CPAP auto titration completed Dennie Bible, [facility], [facility], [facility] [address], [place], [place] [address] [phone]

## 2015-12-31 NOTE — Telephone Encounter (Signed)
Patient decided she wants to talk to [facility] or [place] about a CPAP Titration.  Kristen please call patient.

## 2015-12-31 NOTE — Patient Instructions (Signed)
Continue Ambien Rx to patient Please stop by the sleep lab on the way out Follow up with Dr. Brett Fairy after CPAP has begun

## 2015-12-31 NOTE — Telephone Encounter (Signed)
Order in for cipap titration.

## 2016-01-01 NOTE — Telephone Encounter (Signed)
i called but could only leave a message.  I promised our Lab will call back on Monday to arrange for CPAP titration . CD

## 2016-01-04 ENCOUNTER — Other Ambulatory Visit: Payer: Self-pay

## 2016-01-04 NOTE — Telephone Encounter (Signed)
Spoke to pt. She is concerned that she will not sleep enough here in the lab to get any data. She reports that this has happened before. She says, "If I can't get to sleep at home, how will I get to sleep there?"  She just wants to make sure that she when she comes in for the study, she is not wasting her time..   I found that Dr. Brett Fairy ordered an auto cpap for the pt in July of 2016, and I asked her about that. She reports that it was not covered by her insurance so she didn't hear anything about it.  She wants to know what Dr. Brett Fairy thinks. I can definitely try the auto cpap route again and ask a DME to please set her up on one, if pt does not want to come in for a study

## 2016-01-04 NOTE — Telephone Encounter (Signed)
If the auto pap is still valid after over 6 month since sleep study, yes , get auto pap. I will allow her a low dose fo XANAX just for the sleep study, if the study needs to be repeated for insurance purposes to obtain a machine. CD

## 2016-01-04 NOTE — Telephone Encounter (Signed)
Please call and find out if she  Is ready for a sleep study?

## 2016-01-05 MED ORDER — METOPROLOL SUCCINATE ER 50 MG PO TB24: ORAL_TABLET | ORAL | Status: DC

## 2016-01-05 NOTE — Telephone Encounter (Signed)
Rx request sent to pharmacy.  

## 2016-01-07 NOTE — Telephone Encounter (Signed)
[facility] is still investigating if they are able to provide an auto pap. Since the last sleep study was in 2015, I think they may require another sleep study first, but they said they would get back to me with their verdict soon.

## 2016-01-07 NOTE — Telephone Encounter (Signed)
Received this notice from [facility]:  "Ok, I heard back and unfortunately her SS from 2015 would be to old to use. Unless there is a recent study she will need to start the process over again. Let me know if you have any questions."  I spoke to pt and advised her that a new study would be needed, and Dr. Brett Fairy has already put in for a cpap titration. Pt is agreeable to this study as long as Dr. Brett Fairy can prescribe her something to help her sleep that night. She is concerned that she will not go to sleep and it will be a waste. I advised her that I would speak to Dr. Brett Fairy regarding some sleep medication and that our sleep lab will call to get her scheduled. Pt verbalized understanding.

## 2016-01-07 NOTE — Telephone Encounter (Signed)
Is she coming in ? Or can she have a autopap?  Forward to [employer].

## 2016-01-07 NOTE — Addendum Note (Signed)
Addended by: Larey Seat on: 01/07/2016 01:45 PM   Modules accepted: Orders

## 2016-01-08 NOTE — Addendum Note (Signed)
Addended by: Larey Seat on: 01/08/2016 12:34 PM   Modules accepted: Orders

## 2016-01-14 ENCOUNTER — Ambulatory Visit: Payer: Self-pay | Admitting: Pharmacist

## 2016-01-14 DIAGNOSIS — Z5181 Encounter for therapeutic drug level monitoring: Secondary | ICD-10-CM

## 2016-01-14 DIAGNOSIS — I480 Paroxysmal atrial fibrillation: Secondary | ICD-10-CM

## 2016-01-20 ENCOUNTER — Ambulatory Visit (INDEPENDENT_AMBULATORY_CARE_PROVIDER_SITE_OTHER): Payer: Medicare Other

## 2016-01-20 VITALS — BP 142/72 | HR 62 | Temp 98.2°F | Ht 63.0 in | Wt 202.8 lb

## 2016-01-20 DIAGNOSIS — Z23 Encounter for immunization: Secondary | ICD-10-CM

## 2016-01-20 DIAGNOSIS — Z0000 Encounter for general adult medical examination without abnormal findings: Secondary | ICD-10-CM

## 2016-01-20 NOTE — Progress Notes (Signed)
Subjective:   [name] is a 70 y.o. female who presents for an Initial Medicare Annual Wellness Visit.  Cardiac Risk Factors include: advanced age (>71men, >68 women);obesity (BMI >30kg/m2);sedentary lifestyle;hypertension (atrial fibrillation)     Objective:    Today's Vitals   01/20/16 1348  BP: 142/72  Pulse: 62  Temp: 98.2 F (36.8 C)  TempSrc: Oral  Height: 5\' 3"  (1.6 m)  Weight: 202 lb 12 oz (91.967 kg)  SpO2: 96%  PainSc: 0-No pain    Current Medications (verified) Outpatient Encounter Prescriptions as of 01/20/2016  Medication Sig  . ketorolac (ACULAR) 0.5 % ophthalmic solution Place 0.5 drops into the left eye 4 (four) times daily.  Marland Kitchen losartan (COZAAR) 100 MG tablet Take 1 tablet (100 mg total) by mouth daily.  . metoprolol succinate (TOPROL-XL) 50 MG 24 hr tablet TAKE 1 TABLET (50 MG TOTAL) BY MOUTH DAILY. TAKE WITH OR IMMEDIATELY FOLLOWING A MEAL.  Marland Kitchen omeprazole (PRILOSEC) 20 MG capsule Take 1 capsule (20 mg total) by mouth daily.  . propafenone (RYTHMOL) 300 MG tablet TAKE 1 TABLET BY MOUTH 2 TIMES DAILY.  . rivaroxaban (XARELTO) 20 MG TABS tablet Take 1 tablet (20 mg total) by mouth daily with supper.  . topiramate (TOPAMAX) 25 MG tablet TAKE 1 TABLET IN THE EVENING 1-2 HOURS BEFORE BEDTIME  . zolpidem (AMBIEN) 10 MG tablet Take 1 tablet (10 mg total) by mouth at bedtime as needed. for sleep   No facility-administered encounter medications on file as of 01/20/2016.    Allergies (verified) Codeine   History: Past Medical History  Diagnosis Date  . Paroxysmal atrial fibrillation (HCC)   . Diverticulosis   . Hiatal hernia   . Hyperlipidemia   . Hypertension   . Chronic insomnia   . Sleep apnea   . Restless leg syndrome   . Hx of adenomatous colonic polyps   . Internal hemorrhoids   . Carotid bruit   . Fatty liver   . Colon polyps   . Pure hypercholesterolemia   . Special screening for malignant neoplasms, colon   . Routine general medical  examination at a health care facility   . Other malaise and fatigue   . Screening for lipoid disorders   . Other acute sinusitis   . Acute upper respiratory infections of unspecified site   . Menopause     noticed in early 103's  . Insomnia, controlled   . Insomnia, controlled 01/01/2014  . Macular pucker, left eye     surgery planned 01-14-15  . Cataracts, both eyes    Past Surgical History  Procedure Laterality Date  . Tubal ligation    . Cholecystectomy    . Cardiovascular stress mri  12/20/06    afib  . Orif- l distal radius  9/07  . Cardiolyte neg  6/06  . Knee arthroscopy      left   Family History  Problem Relation Age of Onset  . Breast cancer Mother   . Heart disease Father   . Diabetes      cousins  . Colon cancer Neg Hx    Social History   Occupational History  . BOOK KEEPER    Social History Main Topics  . Smoking status: Never Smoker   . Smokeless tobacco: Never Used  . Alcohol Use: 0.6 oz/week    1 Glasses of wine per week     Comment: rarely  . Drug Use: No  . Sexual Activity: No  Tobacco Counseling Counseling given: No   Activities of Daily Living In your present state of health, do you have any difficulty performing the following activities: 01/20/2016  Hearing? Y  Vision? N  Difficulty concentrating or making decisions? N  Walking or climbing stairs? N  Dressing or bathing? N  Doing errands, shopping? N  Preparing Food and eating ? N  Using the Toilet? N  In the past six months, have you accidently leaked urine? N  Do you have problems with loss of bowel control? N  Managing your Medications? N  Managing your Finances? N  Housekeeping or managing your Housekeeping? N    Immunizations and Health Maintenance Immunization History  Administered Date(s) Administered  . Pneumococcal Conjugate-13 01/20/2016  . Pneumococcal Polysaccharide-23 12/22/2009  . Zoster 06/08/2011   There are no preventive care reminders to display for this  patient.  Patient Care Team: Lucille Passy, MD as PCP - General Rutherford Guys, MD as Consulting Physician (Ophthalmology)     Assessment:   This is a routine wellness examination for [name].   Hearing/Vision screen  Hearing Screening   125Hz  250Hz  500Hz  1000Hz  2000Hz  4000Hz  8000Hz   Right ear:   0 0 0 40   Left ear:   0 0 0 0   Vision Screening Comments: Last eye exam with Dr. Gershon Crane in Nov 2016  Dietary issues and exercise activities discussed: Current Exercise Habits: The patient does not participate in regular exercise at present, Exercise limited by: None identified (pt states she is not motivated)  Goals    . Weight < 200 lb (90.719 kg)     Starting 01/21/2016, I will increase intake of water by drinking at least 2 additional glasses of water daily.       Depression Screen PHQ 2/9 Scores 01/20/2016  PHQ - 2 Score 0    Fall Risk Fall Risk  01/20/2016  Falls in the past year? No    Cognitive Function: MMSE - Mini Mental State Exam 01/20/2016  Orientation to time 5  Orientation to Place 5  Registration 3  Attention/ Calculation 5  Recall 3  Language- name 2 objects 0  Language- repeat 1  Language- follow 3 step command 3  Language- read & follow direction 1  Write a sentence 0  Copy design 0  Total score 26    Screening Tests Health Maintenance  Topic Date Due  . INFLUENZA VACCINE  06/07/2016 (Originally 06/08/2015)  . MAMMOGRAM  10/07/2016 (Originally 06/07/2015)  . DEXA SCAN  01/05/2017 (Originally 06/28/2009)  . Hepatitis C Screening  01/05/2017 (Originally Jul 26, 1946)  . TETANUS/TDAP  01/19/2026 (Originally 06/29/1965)  . COLONOSCOPY  12/07/2025  . ZOSTAVAX  Completed  . PNA vac Low Risk Adult  Completed      Plan:      I have personally reviewed the Medicare Annual Wellness questionnaire and have noted the following in the patient's chart:  A. Medical and social history B. Use of alcohol, tobacco or illicit drugs  C. Current medications and  supplements D. Functional ability and status E.  Nutritional status F.  Physical activity G. Advance directives H. List of other physicians I.  Hospitalizations, surgeries, and ER visits in previous 12 months J.  [place] to include hearing, vision, cognitive, depression L. Referrals and appointments - audiology  In addition, I reviewed preventive protocols, quality metrics, and best practice recommendations specific to patient. A written personalized care plan for preventive services as well as general preventive health recommendations were  provided to patient. Additionally, patient's risk factors for metabolic syndrome was discussed. Educational literature was provided to patient from [facility] regarding these risks.   See attached scanned questionnaire for additional information.   Signed,   Lindell Noe, MHA, BS, LPN Health Advisor [id number]

## 2016-01-20 NOTE — Progress Notes (Signed)
Pre visit review using our clinic review tool, if applicable. No additional management support is needed unless otherwise documented below in the visit note. 

## 2016-01-20 NOTE — Patient Instructions (Signed)
[name] , Thank you for taking time to come for your Medicare Wellness Visit. I appreciate your ongoing commitment to your health goals. Please review the following plan we discussed and let me know if I can assist you in the future.   These are the goals we discussed: Goals    . Weight < 200 lb (90.719 kg)     Starting 01/21/2016, I will increase intake of water by drinking at least 2 additional glasses of water daily.        This is a list of the screening recommended for you and due dates:  Health Maintenance  Topic Date Due  . Flu Shot  06/07/2016*  . Mammogram  10/07/2016*  . DEXA scan (bone density measurement)  01/05/2017*  .  Hepatitis C: One time screening is recommended by Center for Disease Control  (CDC) for  adults born from 50 through 1965.   01/05/2017*  . Tetanus Vaccine  01/19/2026*  . Colon Cancer Screening  12/07/2025  . Shingles Vaccine  Completed  . Pneumonia vaccines  Completed  *Topic was postponed. The date shown is not the original due date.   Preventive Care for Adults  A healthy lifestyle and preventive care can promote health and wellness. Preventive health guidelines for adults include the following key practices.  . A routine yearly physical is a good way to check with your health care provider about your health and preventive screening. It is a chance to share any concerns and updates on your health and to receive a thorough exam.  . Visit your dentist for a routine exam and preventive care every 6 months. Brush your teeth twice a day and floss once a day. Good oral hygiene prevents tooth decay and gum disease.  . The frequency of eye exams is based on your age, health, family medical history, use  of contact lenses, and other factors. Follow your health care provider's ecommendations for frequency of eye exams.  . Eat a healthy diet. Foods like vegetables, fruits, whole grains, low-fat dairy products, and lean protein foods contain the nutrients  you need without too many calories. Decrease your intake of foods high in solid fats, added sugars, and salt. Eat the right amount of calories for you. Get information about a proper diet from your health care provider, if necessary.  . Regular physical exercise is one of the most important things you can do for your health. Most adults should get at least 150 minutes of moderate-intensity exercise (any activity that increases your heart rate and causes you to sweat) each week. In addition, most adults need muscle-strengthening exercises on 2 or more days a week.  Silver Sneakers may be a benefit available to you. To determine eligibility, you may visit the website: www.silversneakers.com or contact program at (978)490-1488 Mon-Fri between 8AM-8PM.   . Maintain a healthy weight. The body mass index (BMI) is a screening tool to identify possible weight problems. It provides an estimate of body fat based on height and weight. Your health care provider can find your BMI and can help you achieve or maintain a healthy weight.   For adults 20 years and older: ? A BMI below 18.5 is considered underweight. ? A BMI of 18.5 to 24.9 is normal. ? A BMI of 25 to 29.9 is considered overweight. ? A BMI of 30 and above is considered obese.   . Maintain normal blood lipids and cholesterol levels by exercising and minimizing your intake of saturated fat. Eat  a balanced diet with plenty of fruit and vegetables. Blood tests for lipids and cholesterol should begin at age 5 and be repeated every 5 years. If your lipid or cholesterol levels are high, you are over 50, or you are at high risk for heart disease, you may need your cholesterol levels checked more frequently. Ongoing high lipid and cholesterol levels should be treated with medicines if diet and exercise are not working.  . If you smoke, find out from your health care provider how to quit. If you do not use tobacco, please do not start.  . If you choose to  drink alcohol, please do not consume more than 2 drinks per day. One drink is considered to be 12 ounces (355 mL) of beer, 5 ounces (148 mL) of wine, or 1.5 ounces (44 mL) of liquor.  . If you are 67-11 years old, ask your health care provider if you should take aspirin to prevent strokes.  . Use sunscreen. Apply sunscreen liberally and repeatedly throughout the day. You should seek shade when your shadow is shorter than you. Protect yourself by wearing long sleeves, pants, a wide-brimmed hat, and sunglasses year round, whenever you are outdoors.  . Once a month, do a whole body skin exam, using a mirror to look at the skin on your back. Tell your health care provider of new moles, moles that have irregular borders, moles that are larger than a pencil eraser, or moles that have changed in shape or color.

## 2016-01-22 NOTE — Progress Notes (Signed)
I reviewed health advisor's note, was available for consultation, and agree with documentation and plan.  

## 2016-01-27 ENCOUNTER — Ambulatory Visit (INDEPENDENT_AMBULATORY_CARE_PROVIDER_SITE_OTHER): Payer: Medicare Other | Admitting: Neurology

## 2016-01-27 DIAGNOSIS — G4733 Obstructive sleep apnea (adult) (pediatric): Secondary | ICD-10-CM | POA: Diagnosis not present

## 2016-01-27 DIAGNOSIS — G4730 Sleep apnea, unspecified: Secondary | ICD-10-CM

## 2016-01-27 DIAGNOSIS — G4700 Insomnia, unspecified: Secondary | ICD-10-CM

## 2016-01-28 NOTE — Sleep Study (Signed)
Please see the scanned sleep study interpretation located in the procedure tab within the chart review section.   

## 2016-02-03 ENCOUNTER — Telehealth: Payer: Self-pay

## 2016-02-03 DIAGNOSIS — G4733 Obstructive sleep apnea (adult) (pediatric): Secondary | ICD-10-CM

## 2016-02-03 NOTE — Telephone Encounter (Signed)
I called pt to discuss sleep study results. No answer at home or cell number, left messages at both numbers asking her to call me back.

## 2016-02-03 NOTE — Telephone Encounter (Signed)
Spoke to pt regarding her sleep study results. I advised her that Dr. Brett Fairy recommends that she start a cpap. Pt is agreeable to this. I advised her that I would send an order for cpap to a DME and they would call pt to get her set up on a cpap. Pt verbalized understanding. I advised her to use her cpap at least four hours per night. Pt verbalized understanding. Will send to [facility]. A follow up appt was made for 04/06/2016 at 9:30. Pt verbalized understanding.

## 2016-02-09 ENCOUNTER — Ambulatory Visit (AMBULATORY_SURGERY_CENTER): Payer: Self-pay

## 2016-02-09 VITALS — Ht 63.0 in | Wt 201.6 lb

## 2016-02-09 DIAGNOSIS — Z800 Family history of malignant neoplasm of digestive organs: Secondary | ICD-10-CM

## 2016-02-09 DIAGNOSIS — Z86010 Personal history of colonic polyps: Secondary | ICD-10-CM

## 2016-02-09 NOTE — Progress Notes (Signed)
Per pt, no allergies to soy or egg products.Pt not taking any weight loss meds or using  O2 at home. 

## 2016-03-01 ENCOUNTER — Encounter: Payer: Self-pay | Admitting: Gastroenterology

## 2016-03-01 ENCOUNTER — Ambulatory Visit (AMBULATORY_SURGERY_CENTER): Payer: Medicare Other | Admitting: Gastroenterology

## 2016-03-01 VITALS — BP 149/74 | HR 63 | Temp 97.8°F | Resp 17

## 2016-03-01 DIAGNOSIS — Z800 Family history of malignant neoplasm of digestive organs: Secondary | ICD-10-CM

## 2016-03-01 DIAGNOSIS — G4733 Obstructive sleep apnea (adult) (pediatric): Secondary | ICD-10-CM | POA: Diagnosis not present

## 2016-03-01 DIAGNOSIS — D123 Benign neoplasm of transverse colon: Secondary | ICD-10-CM

## 2016-03-01 DIAGNOSIS — D127 Benign neoplasm of rectosigmoid junction: Secondary | ICD-10-CM

## 2016-03-01 DIAGNOSIS — Z860100 Personal history of colon polyps, unspecified: Secondary | ICD-10-CM

## 2016-03-01 DIAGNOSIS — Z1211 Encounter for screening for malignant neoplasm of colon: Secondary | ICD-10-CM | POA: Diagnosis not present

## 2016-03-01 DIAGNOSIS — I10 Essential (primary) hypertension: Secondary | ICD-10-CM | POA: Diagnosis not present

## 2016-03-01 HISTORY — DX: Personal history of colonic polyps: Z86.010

## 2016-03-01 HISTORY — DX: Personal history of colon polyps, unspecified: Z86.0100

## 2016-03-01 MED ORDER — SODIUM CHLORIDE 0.9 % IV SOLN: 500.0000 mL | INTRAVENOUS | Status: DC

## 2016-03-01 NOTE — Progress Notes (Signed)
Patient awakening,vss,report to rn 

## 2016-03-01 NOTE — Patient Instructions (Signed)
Impression/recommendations:  Polyps (handout given) Diverticulosis (handout given) High Fiber Diet (handout given)  YOU HAD AN ENDOSCOPIC PROCEDURE TODAY AT [facility]:   Refer to the procedure report that was given to you for any specific questions about what was found during the examination.  If the procedure report does not answer your questions, please call your gastroenterologist to clarify.  If you requested that your care partner not be given the details of your procedure findings, then the procedure report has been included in a sealed envelope for you to review at your convenience later.  YOU SHOULD EXPECT: Some feelings of bloating in the abdomen. Passage of more gas than usual.  Walking can help get rid of the air that was put into your GI tract during the procedure and reduce the bloating. If you had a lower endoscopy (such as a colonoscopy or flexible sigmoidoscopy) you may notice spotting of blood in your stool or on the toilet paper. If you underwent a bowel prep for your procedure, you may not have a normal bowel movement for a few days.  Please Note:  You might notice some irritation and congestion in your nose or some drainage.  This is from the oxygen used during your procedure.  There is no need for concern and it should clear up in a day or so.  SYMPTOMS TO REPORT IMMEDIATELY:   Following lower endoscopy (colonoscopy or flexible sigmoidoscopy):  Excessive amounts of blood in the stool  Significant tenderness or worsening of abdominal pains  Swelling of the abdomen that is new, acute  Fever of 100F or higher  For urgent or emergent issues, a gastroenterologist can be reached at any hour by calling [phone].  May resume Xarelto this evening.  Repeat colonoscopy in 3 years.  DIET: Your first meal following the procedure should be a small meal and then it is ok to progress to your normal diet. Heavy or fried foods are harder to digest and may make  you feel nauseous or bloated.  Likewise, meals heavy in dairy and vegetables can increase bloating.  Drink plenty of fluids but you should avoid alcoholic beverages for 24 hours.  ACTIVITY:  You should plan to take it easy for the rest of today and you should NOT DRIVE or use heavy machinery until tomorrow (because of the sedation medicines used during the test).    FOLLOW UP: Our staff will call the number listed on your records the next business day following your procedure to check on you and address any questions or concerns that you may have regarding the information given to you following your procedure. If we do not reach you, we will leave a message.  However, if you are feeling well and you are not experiencing any problems, there is no need to return our call.  We will assume that you have returned to your regular daily activities without incident.  If any biopsies were taken you will be contacted by phone or by letter within the next 1-3 weeks.  Please call us at [phone] if you have not heard about the biopsies in 3 weeks.    SIGNATURES/CONFIDENTIALITY: You and/or your care partner have signed paperwork which will be entered into your electronic medical record.  These signatures attest to the fact that that the information above on your After Visit Summary has been reviewed and is understood.  Full responsibility of the confidentiality of this discharge information lies with you and/or your care-partner.

## 2016-03-01 NOTE — Op Note (Signed)
[facility] Patient Name: [name] Procedure Date: 03/01/2016 12:01 PM MRN: [id number] Endoscopist: Remo Lipps P. Havery Moros , MD Age: 70 Date of Birth: Jun 11, 1946 Gender: Female Procedure:                Colonoscopy Indications:              High risk colon cancer surveillance: Personal                            history of colonic polyps. Patient with 4 adenomas                            removed on colonoscopy in January 2016, however                            exam limited due to poor prep. Patient on Xarelto Medicines:                Monitored Anesthesia Care Procedure:                Pre-Anesthesia Assessment:                           - Prior to the procedure, a History and Physical                            was performed, and patient medications and                            allergies were reviewed. The patient's tolerance of                            previous anesthesia was also reviewed. The risks                            and benefits of the procedure and the sedation                            options and risks were discussed with the patient.                            All questions were answered, and informed consent                            was obtained. Prior Anticoagulants: The patient has                            taken Xarelto (rivaroxaban), last dose was 3 days                            prior to procedure. ASA Grade Assessment: III - A                            patient with severe systemic disease. After  reviewing the risks and benefits, the patient was                            deemed in satisfactory condition to undergo the                            procedure.                           After obtaining informed consent, the colonoscope                            was passed under direct vision. Throughout the                            procedure, the patient's blood pressure, pulse, and   oxygen saturations were monitored continuously. The                            Model CF-HQ190L [id number] [id number]) scope was introduced                            through the anus and advanced to the the cecum,                            identified by appendiceal orifice and ileocecal                            valve. The colonoscopy was performed without                            difficulty. The patient tolerated the procedure                            well. The quality of the bowel preparation was                            adequate. The ileocecal valve, appendiceal orifice,                            and rectum were photographed. Scope In: 12:11:09 PM Scope Out: 12:25:00 PM Scope Withdrawal Time: 0 hours 10 minutes 15 seconds  Total Procedure Duration: 0 hours 13 minutes 51 seconds  Findings:                 The perianal and digital rectal examinations were                            normal.                           A single small angiodysplastic lesion without                            bleeding was found in the cecum.  A 3 mm polyp was found in the transverse colon. The                            polyp was sessile. The polyp was removed with a                            cold biopsy forceps. Resection and retrieval were                            complete.                           A 3 mm polyp was found in the sigmoid colon. The                            polyp was sessile. The polyp was removed with a                            cold biopsy forceps. Resection and retrieval were                            complete.                           Many small and large-mouthed diverticula were found                            in the entire colon.                           Anal papilla(e) were hypertrophied.                           The exam was otherwise without abnormality. Complications:            No immediate complications. Estimated blood loss:                             Minimal. Estimated Blood Loss:     Estimated blood loss was minimal. Impression:               - A single non-bleeding colonic angiodysplastic                            lesion.                           - One 3 mm polyp in the transverse colon, removed                            with a cold biopsy forceps. Resected and retrieved.                           - One 3 mm polyp in the sigmoid colon, removed with  a cold biopsy forceps. Resected and retrieved.                           - Diverticulosis in the entire examined colon.                           - Anal papilla(e) were hypertrophied.                           - The examination was otherwise normal. Recommendation:           - Patient has a contact number available for                            emergencies. The signs and symptoms of potential                            delayed complications were discussed with the                            patient. Return to normal activities tomorrow.                            Written discharge instructions were provided to the                            patient.                           - Resume previous diet.                           - Continue present medications.                           - Await pathology results.                           - Repeat colonoscopy in 3 years for surveillance                            given multiple adenomas on the last exam, if you                            wish to continue surveillance colonoscopy at that                            time.                           - Resume Xarelto tonight Remo Lipps P. Thai Burgueno, MD 03/01/2016 12:32:33 PM This report has been signed electronically.

## 2016-03-01 NOTE — Progress Notes (Signed)
Called to room to assist during endoscopic procedure.  Patient ID and intended procedure confirmed with present staff. Received instructions for my participation in the procedure from the performing physician.  

## 2016-03-02 ENCOUNTER — Telehealth: Payer: Self-pay | Admitting: *Deleted

## 2016-03-02 NOTE — Telephone Encounter (Signed)
  Follow up Call-  Call back number 03/01/2016 12/08/2015  Post procedure Call Back phone  # [phone] home [phone]  Permission to leave phone message Yes Yes     Patient questions:  Do you have a fever, pain , or abdominal swelling? No. Pain Score  0 *  Have you tolerated food without any problems? Yes.    Have you been able to return to your normal activities? Yes.    Do you have any questions about your discharge instructions: Diet   No. Medications  No. Follow up visit  No.  Do you have questions or concerns about your Care? No.  Actions: * If pain score is 4 or above: No action needed, pain <4.

## 2016-03-04 ENCOUNTER — Other Ambulatory Visit: Payer: Self-pay | Admitting: Gastroenterology

## 2016-03-09 ENCOUNTER — Encounter: Payer: Self-pay | Admitting: Gastroenterology

## 2016-04-06 ENCOUNTER — Ambulatory Visit: Payer: Self-pay | Admitting: Neurology

## 2016-04-14 ENCOUNTER — Telehealth: Payer: Self-pay | Admitting: Family Medicine

## 2016-04-14 NOTE — Telephone Encounter (Signed)
Spoke with pt per thn list. Pt stated she would call back later this summer to schedule appointment She was busy planning trips to schedule and would call back

## 2016-04-25 ENCOUNTER — Ambulatory Visit (INDEPENDENT_AMBULATORY_CARE_PROVIDER_SITE_OTHER): Payer: Medicare Other | Admitting: Neurology

## 2016-04-25 ENCOUNTER — Encounter: Payer: Self-pay | Admitting: Neurology

## 2016-04-25 VITALS — BP 126/68 | HR 68 | Resp 20 | Ht 63.0 in | Wt 201.0 lb

## 2016-04-25 DIAGNOSIS — I480 Paroxysmal atrial fibrillation: Secondary | ICD-10-CM | POA: Insufficient documentation

## 2016-04-25 DIAGNOSIS — G4700 Insomnia, unspecified: Secondary | ICD-10-CM

## 2016-04-25 DIAGNOSIS — G4733 Obstructive sleep apnea (adult) (pediatric): Secondary | ICD-10-CM | POA: Diagnosis not present

## 2016-04-25 DIAGNOSIS — G4730 Sleep apnea, unspecified: Secondary | ICD-10-CM

## 2016-04-25 MED ORDER — ZOLPIDEM TARTRATE 10 MG PO TABS: 10.0000 mg | ORAL_TABLET | Freq: Every evening | ORAL | Status: DC | PRN

## 2016-04-25 NOTE — Patient Instructions (Signed)

## 2016-04-25 NOTE — Progress Notes (Signed)
[facility]  Provider:  Larey Seat, M D  Referring Provider: Lucille Passy, MD Primary Care Physician:  Arnette Norris, MD  Chief Complaint  Patient presents with  . Follow-up    hates cpap, not sleeping with it    HPI:  [name] is a 70 y.o. female  Is seen here as a referral/ revisit  from Dr. Deborra Medina for chronic insomnia. The patient will be evaluated for organic reasons of insomnia.     12-2013 Note, CD  The patient was last seen on  01-20-12  and has lost a lot of weight in the meantime.  After trazodone and Remeron did feel to produce satisfying results for now she will sometimes take a Benadryl or Tylenol PM to assist her with her sleep initiation. She thinks was a night was never the main problem. The patient states that she has lost a lot of weight intentionally and that this has helped certainly some of her other health issues including hypertension. She was diagnosed with atrial fibrillation in the past has been on chronic anticoagulation. She reported that topiramate caused her to be drowsy and daytime when taken in the morning for that reason we will change the intake time 2 PM this may assist with her sleepiness as well as was her headaches. It has also helped her to lose weight.  At bedtime routines were reviewed : the patient was to bed around 11 PM rises at 6:30 AM depending on how much sleep she got.  She did have amnestic spells from after some nights when using Ambien but she sleeps all much better on Ambien but on the other medications.  The onset of her insomnia was related to the development of menopause but she only has really hot flashes now.  Her bedroom is described as quiet, cool and dark she shares a bedroom with her husband.    Interval history , 01-13-15 CD,  [name] is seen here following up on her sleep study from 3-20 5-15 the study showed a patient with normal heart rate some prolonged oxygen desaturation and very mild apnea. A  problem for the patient was that she couldn't really sleep through the night she was 53 minutes awake during her sleep study. 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 Dr. Zenia Resides had originally been referred to the patient for insomnia evaluation. Based on the hypoxemia and the mix of a mild sleep apnea , she could be invited the patient to come in for a CPAP titration split-night study. The patient is beginning cataracts and she has just been seen by optometry, she has been insomnic again. Testing was suspected to reveal also retinal disease and she was referred to Dr. Zadie Rhine a retinal specialist. She has a macular pucker. Surgery is scheduled for tomorrow morning 01-14-15. She is in need of a refill for Ambien which he uses to treat her chronic insomnia. In spite of this the last week had been difficult for her I think because of anxiety in anticipation of the surgery. Her fatigue severity score is up to 40 points and her fatigue her Epworth sleepiness score is normal at 2 points.  Her sleep is not restorative, still. She will have an overnight pulse-oximetry after her surgery is done. I will schedule this though [facility].  Rv with NP or me in May-June 2016.  06-04-15, [name] is here status post cataract ectomy bilaterally and surgery to the retina. As I had quoted in  my last visit she underwent a sleep study on 3-20 5-15. She continued to complain of nonrestorative not refreshing sleep. She feels however that she has started to manage better with that and her fatigue is no longer as excessive. She endorsed the fatigue severity score of 31 points and the Epworth sleepiness score at 3 points. She's not likely to fall asleep unintended. She doesn't struggle with sleep attacks. In her geriatric depression score she endorsed only 1 point which is not indicative of suffering from clinical depression. She wears a fit bit, which records her pulse, she wore a ONO, and had 2 hours of low oxygen  levels, she will need a referral to pulmonology for further evaluation or will proceed with a CPAP auto-titration, to see if correcting the mild apnea is an option. She needs to lose weight.   04-25-2016 [name] was last seen by mypractitioner Cecille Rubin, and referred for a new CPAP titration on 01/27/2016. She was again diagnosed with insomnia and a low sleep efficiency of only 72.5% CPAP was explored from 5 through 8 cm water. And an air-fit P 10 nasal pillow in extra small size was used. She still reports that she feels restless and uncomfortable just was having anything in her face or attached to her body. And of course the PLM arousals were not addressed by CPAP. She would like to sleep on her side but feels compelled to sleep in supine position so that the mass does not get as long.  The compliance data since May 30 show 14 out of 15 days of use she used to machine over 4 hours for only 11 out of 15 days. A 74% compliance. The average user time is 4 hours and 56 minutes. The set pressure was 8 cm water with 3 cm EPR and her residual AHI is 2.9 there is a significant reduction in her apnea index. However her baseline AHI was not that high in 2015 with an AHI of 9. I think given that the patient feels no improvement in her daytime alertness and actually more bothered by the machine and given that she has a mild apnea to begin with I would allow her to discontinue the use of CPAP she could change to a dental device is snoring bothers her, but she denies. She will concentrate on weight loss.       Review of Systems: Out of a complete 14 system review, the patient complains of only the following symptoms, and all other reviewed systems are negative. The patient endorsed a runny nose, some hearing loss, some palpitations and intolerance of cold temperatures, restless legs and insomnia daytime sleepiness and snoring. Obesity .  Social History   Social History  . Marital Status: Married     Spouse Name: Sterling Big  . Number of Children: 3  . Years of Education: College   Occupational History  . BOOK KEEPER    Social History Main Topics  . Smoking status: Never Smoker   . Smokeless tobacco: Never Used  . Alcohol Use: Yes     Comment: rarely  . Drug Use: No  . Sexual Activity: No   Other Topics Concern  . Not on file   Social History Narrative   Patient is married Sterling Big) and lives at home with her husband.   Patient has three adult children.   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    Patient is right-handed.   Patient drinks two  cups of caffeine daily.   Patient has a college education.    Family History  Problem Relation Age of Onset  . Breast cancer Mother   . Heart disease Father   . Diabetes      cousins  . Colon cancer Cousin 73    Past Medical History  Diagnosis Date  . Paroxysmal atrial fibrillation (HCC)     on meds  . Diverticulosis   . Hiatal hernia   . Hyperlipidemia   . Hypertension   . Chronic insomnia   . Sleep apnea     no c-pap  . Restless leg syndrome   . Hx of adenomatous colonic polyps   . Internal hemorrhoids   . Carotid bruit   . Fatty liver   . Colon polyps   . Pure hypercholesterolemia   . Special screening for malignant neoplasms, colon   . Routine general medical examination at a health care facility   . Other malaise and fatigue   . Screening for lipoid disorders   . Other acute sinusitis   . Acute upper respiratory infections of unspecified site   . Menopause     noticed in early 11's  . Insomnia, controlled 01/01/2014  . Macular pucker, left eye     surgery planned 01-14-15  . Cataracts, both eyes     Past Surgical History  Procedure Laterality Date  . Tubal ligation    . Cholecystectomy    . Cardiovascular stress mri  12/20/06    afib  . Orif- l distal radius  9/07    left wrist  . Cardiolyte neg  6/06  . Knee arthroscopy      left  . Pars plana vitrectomy w/  repair of macular hole      left eye    Current Outpatient Prescriptions  Medication Sig Dispense Refill  . ketorolac (ACULAR) 0.5 % ophthalmic solution Place 0.5 drops into the left eye 4 (four) times daily.    Marland Kitchen losartan (COZAAR) 100 MG tablet Take 1 tablet (100 mg total) by mouth daily. 90 tablet 3  . metoprolol succinate (TOPROL-XL) 50 MG 24 hr tablet TAKE 1 TABLET (50 MG TOTAL) BY MOUTH DAILY. TAKE WITH OR IMMEDIATELY FOLLOWING A MEAL. 30 tablet 2  . omeprazole (PRILOSEC) 20 MG capsule TAKE 1 CAPSULE (20 MG TOTAL) BY MOUTH DAILY. 90 capsule 0  . propafenone (RYTHMOL) 300 MG tablet TAKE 1 TABLET BY MOUTH 2 TIMES DAILY. 60 tablet 3  . rivaroxaban (XARELTO) 20 MG TABS tablet Take 1 tablet (20 mg total) by mouth daily with supper. 90 tablet 3  . zolpidem (AMBIEN) 10 MG tablet Take 1 tablet (10 mg total) by mouth at bedtime as needed. for sleep 30 tablet 5   No current facility-administered medications for this visit.    Allergies as of 04/25/2016 - Review Complete 04/25/2016  Allergen Reaction Noted  . Codeine Nausea And Vomiting     Vitals: BP 126/68 mmHg  Pulse 68  Resp 20  Ht 5\' 3"  (1.6 m)  Wt 201 lb (91.173 kg)  BMI 35.61 kg/m2 Last Weight:  Wt Readings from Last 1 Encounters:  04/25/16 201 lb (91.173 kg)   Last Height:   Ht Readings from Last 1 Encounters:  04/25/16 5\' 3"  (1.6 m)    Physical exam:  General: The patient is awake, alert and appears not in acute distress. The patient is well groomed. Head: Normocephalic, atraumatic. Neck is supple. Mallampati 3 , neck [id number] ,no TMJ clicking.  Cardiovascular:  irregular rate and rhythm , without  murmurs or carotid bruit, and without distended neck veins. Respiratory: Lungs are clear to auscultation. Skin:  Without evidence of edema, or rash Trunk: BMI is elevated.  Neurologic exam : The patient is awake and alert, oriented to place and time.  Memory subjective described as intact. There is a normal  attention span & concentration ability. Speech is fluent without  dysarthria, dysphonia or aphasia. Mood and affect are appropriate.  Cranial nerves: Pupils are equal and briskly reactive to light.  Extraocular movements  in vertical and horizontal planes intact , she has left over right ptosis.  Visual fields by finger perimetry are intact. Hearing to finger rub intact.  Facial sensation intact to fine touch.  Facial motor strength is symmetric and tongue and uvula move midline.  Assessment:  After physical and neurologic examination, review of laboratory studies, imaging, neurophysiology testing and pre-existing records, assessment is   1) likely Insomnia with mild OSA - patient reported reluctantly about her snoring and waking up with a dry mouth.  No morning headaches , no nocturia.   BMI is still elevated, atrial fibrillation. Her OSA is well treated at 8 cm water pressure, but she does not feel comfortable with any interface.   No headaches on topiramate, which helped with weight loss, too.    Plan:  Treatment plan and additional workup : 1) reviewed CPAP re-titration to see effect on sleep, she is still ding best on 8 cm water , but not well enough- she resents the air fit nasal pillow. She had very mild basleine apnea ,AHI was 9- will allow with medical consent to d/c the CPAP use.   2) patient failed Sonata, Remoron and Temazepam, and Trazodone.  I have Ambien refilled. Try 5 mg and a tyleonol PM. Chronic insomnia.  Referral to sleep psychologist for " racing thoughts " keeping her from sleeping. Marland Kitchen   3)  Weight loss would address all her medical symptoms positively. She has tried nutrasystem. Has ordered food systems, has trouble adhering to a diet.    follow up with NP in 6 month.   Angeligue Bowne, MD

## 2016-04-26 ENCOUNTER — Ambulatory Visit (INDEPENDENT_AMBULATORY_CARE_PROVIDER_SITE_OTHER): Payer: Medicare Other

## 2016-04-26 DIAGNOSIS — I4891 Unspecified atrial fibrillation: Secondary | ICD-10-CM

## 2016-04-26 DIAGNOSIS — Z5181 Encounter for therapeutic drug level monitoring: Secondary | ICD-10-CM | POA: Diagnosis not present

## 2016-04-26 LAB — BASIC METABOLIC PANEL
BUN: 17 mg/dL (ref 7–25)
CO2: 25 mmol/L (ref 20–31)
CREATININE: 1.09 mg/dL — AB (ref 0.60–0.93)
Calcium: 8.5 mg/dL — ABNORMAL LOW (ref 8.6–10.4)
Chloride: 108 mmol/L (ref 98–110)
GLUCOSE: 110 mg/dL — AB (ref 65–99)
Potassium: 4.5 mmol/L (ref 3.5–5.3)
Sodium: 140 mmol/L (ref 135–146)

## 2016-04-26 LAB — CBC
HEMATOCRIT: 38.2 % (ref 35.0–45.0)
HEMOGLOBIN: 13 g/dL (ref 11.7–15.5)
MCH: 30 pg (ref 27.0–33.0)
MCHC: 34 g/dL (ref 32.0–36.0)
MCV: 88 fL (ref 80.0–100.0)
MPV: 10.4 fL (ref 7.5–12.5)
PLATELETS: 170 10*3/uL (ref 140–400)
RBC: 4.34 MIL/uL (ref 3.80–5.10)
RDW: 13.4 % (ref 11.0–15.0)
WBC: 4.7 10*3/uL (ref 3.8–10.8)

## 2016-04-26 NOTE — Patient Instructions (Signed)
A full discussion of the nature of anticoagulants has been carried out.  A benefit/risk analysis has been presented to the patient, so that they understand the justification for choosing anticoagulation with Xarelto at this time.  The need for compliance is stressed.  Pt is aware to take the medication once daily with the largest meal of the day.  Side effects of potential bleeding are discussed, including unusual colored urine or stools, coughing up blood or coffee ground emesis, nose bleeds or serious fall or head trauma.  Discussed signs and symptoms of stroke. The patient should avoid any OTC items containing aspirin or ibuprofen.  Avoid alcohol consumption.   Call if any signs of abnormal bleeding.  Discussed financial obligations and resolved any difficulty in obtaining medication.      

## 2016-04-26 NOTE — Progress Notes (Signed)
Pt was started on Xarelto 20mg  QD for afib on 09/30/15 by Dr Martinique.    Reviewed patients medication list.  Pt is not currently on any combined P-gp and strong CYP3A4 inhibitors/inducers (ketoconazole, traconazole, ritonavir, carbamazepine, phenytoin, rifampin, St. John's wort).  Reviewed labs.  SCr 1.09, Weight 91.2kg, CrCl- 68.16.  Dose appropriate based on CrCl.   Hgb and HCT Within Normal Limits 13.0/38.2 on 04/26/16.  Called spoke with pt, advised labwork stable.  Advised pt to continue on Xarelto 20mg  QD.  Recheck labwork in 6 months, appt made for 10/18/16.

## 2016-05-15 ENCOUNTER — Other Ambulatory Visit: Payer: Self-pay | Admitting: Cardiology

## 2016-06-07 ENCOUNTER — Other Ambulatory Visit: Payer: Self-pay | Admitting: Gastroenterology

## 2016-06-10 ENCOUNTER — Other Ambulatory Visit: Payer: Self-pay | Admitting: Cardiology

## 2016-06-13 NOTE — Telephone Encounter (Signed)
Rx(s) sent to pharmacy electronically.  

## 2016-06-29 ENCOUNTER — Telehealth: Payer: Self-pay

## 2016-06-29 NOTE — Telephone Encounter (Signed)
Received a notice from [facility] that pt has declined to schedule an appt with them.

## 2016-07-27 NOTE — Progress Notes (Signed)
History of Present Illness: [name] is seen today to followup of atrial fibrillation and dyspnea. She has a history of paroxysmal atrial fibrillation managed with Rhythmol and metoprolol. She is on Xarelto for anticoagulation. She had difficulty regulating Coumadin dose. In October 2015 she complained of increased palpitations and her Rhythmol dose was increased with good response. She complained of increased dyspnea and had a normal stress Myoview. Echo showed normal LV function with mild MR and mild Pulmonary HTN.  On follow up today she is doing well.  She has infrequent episodes of AFib happening only once since last visit lasting a little over one hour. She does note a lot of back pain in the am that she states started when she switched to Xarelto. No bruising or bleeding noted.   Past Medical History:  Diagnosis Date  . Acute upper respiratory infections of unspecified site   . Carotid bruit   . Cataracts, both eyes   . Chronic insomnia   . Colon polyps   . Diverticulosis   . Fatty liver   . Hiatal hernia   . Hx of adenomatous colonic polyps   . Hyperlipidemia   . Hypertension   . Insomnia, controlled 01/01/2014  . Internal hemorrhoids   . Macular pucker, left eye    surgery planned 01-14-15  . Menopause    noticed in early 65's  . Other acute sinusitis   . Other malaise and fatigue   . Paroxysmal atrial fibrillation (HCC)    on meds  . Pure hypercholesterolemia   . Restless leg syndrome   . Routine general medical examination at a health care facility   . Screening for lipoid disorders   . Sleep apnea    no c-pap  . Special screening for malignant neoplasms, colon     Current Outpatient Prescriptions  Medication Sig Dispense Refill  . ketorolac (ACULAR) 0.5 % ophthalmic solution Place 0.5 drops into the left eye 4 (four) times daily.    Marland Kitchen losartan (COZAAR) 100 MG tablet Take 1 tablet (100 mg total) by mouth daily. 90 tablet 3  . metoprolol succinate (TOPROL-XL)  50 MG 24 hr tablet TAKE 1 TABLET (50 MG TOTAL) BY MOUTH DAILY. TAKE WITH OR IMMEDIATELY FOLLOWING A MEAL. 90 tablet 2  . omeprazole (PRILOSEC) 20 MG capsule TAKE 1 CAPSULE (20 MG TOTAL) BY MOUTH DAILY. 90 capsule 2  . propafenone (RYTHMOL) 300 MG tablet TAKE 1 TABLET BY MOUTH 2 TIMES DAILY. 180 tablet 0  . rivaroxaban (XARELTO) 20 MG TABS tablet Take 1 tablet (20 mg total) by mouth daily with supper. 90 tablet 3  . zolpidem (AMBIEN) 10 MG tablet Take 1 tablet (10 mg total) by mouth at bedtime as needed. for sleep 30 tablet 5   No current facility-administered medications for this visit.     Allergies: Allergies  Allergen Reactions  . Codeine Nausea And Vomiting    REACTION: vomiting   Review of systems: As noted in history of present illness. All other review of systems are reviewed and are negative.  Vital Signs: BP (!) 155/82   Pulse (!) 59   Ht 5\' 3"  (1.6 m)   Wt 196 lb 6.4 oz (89.1 kg)   SpO2 96%   BMI 34.79 kg/m   PHYSICAL EXAM: Well nourished, obese, in no acute distress  HEENT: normal  Neck: no JVD or bruits  Cardiac:  normal S1, S2; RRR; no murmur  Lungs:  clear to auscultation bilaterally, no wheezing, rhonchi or rales  Abd: soft, nontender, no hepatomegaly  Ext: no edema  Skin: warm and dry  Neuro:  CNs 2-12 intact, no focal abnormalities noted  Laboratory data: Echo: 09/10/14: Study Conclusions  - Left ventricle: The cavity size was normal. Wall thickness was normal. Systolic function was normal. The estimated ejection fraction was in the range of 55% to 65%. Wall motion was normal; there were no regional wall motion abnormalities. - Mitral valve: There was mild regurgitation. - Pulmonary arteries: Systolic pressure was mildly increased.     Lab Results  Component Value Date   WBC 4.7 04/26/2016   HGB 13.0 04/26/2016   HCT 38.2 04/26/2016   PLT 170 04/26/2016   GLUCOSE 110 (H) 04/26/2016   CHOL 198 09/24/2015   TRIG 127 09/24/2015   HDL 52  09/24/2015   LDLDIRECT 137.0 08/10/2010   LDLCALC 121 09/24/2015   ALT 22 09/24/2015   AST 17 09/24/2015   NA 140 04/26/2016   K 4.5 04/26/2016   CL 108 04/26/2016   CREATININE 1.09 (H) 04/26/2016   BUN 17 04/26/2016   CO2 25 04/26/2016   TSH 2.086 08/28/2014   INR 3.2 09/28/2015     ASSESSMENT AND PLAN:  1. Paroxysmal atrial fibrillation. This appears to be  well controlled on  Rythmol. She is on chronic anticoagulation with Xarelto 20 mg daily. I don't think her back pain is related to her Xarelto and recommend she have this evaluated. May benefit from PT.  2. Hypertension. Blood pressure is elevated. Did not take meds this am. Will monitor for now.

## 2016-07-28 ENCOUNTER — Ambulatory Visit (INDEPENDENT_AMBULATORY_CARE_PROVIDER_SITE_OTHER): Payer: Medicare Other | Admitting: Cardiology

## 2016-07-28 ENCOUNTER — Encounter: Payer: Self-pay | Admitting: Cardiology

## 2016-07-28 VITALS — BP 155/82 | HR 59 | Ht 63.0 in | Wt 196.4 lb

## 2016-07-28 DIAGNOSIS — I480 Paroxysmal atrial fibrillation: Secondary | ICD-10-CM | POA: Diagnosis not present

## 2016-07-28 DIAGNOSIS — E7800 Pure hypercholesterolemia, unspecified: Secondary | ICD-10-CM

## 2016-07-28 DIAGNOSIS — I10 Essential (primary) hypertension: Secondary | ICD-10-CM

## 2016-07-28 NOTE — Patient Instructions (Addendum)
Continue your current therapy  I will see you in 6-8 months. 

## 2016-09-12 ENCOUNTER — Other Ambulatory Visit: Payer: Self-pay | Admitting: Cardiology

## 2016-09-12 NOTE — Telephone Encounter (Signed)
Rx request sent to pharmacy.  

## 2016-09-14 ENCOUNTER — Telehealth: Payer: Self-pay | Admitting: Cardiology

## 2016-09-14 MED ORDER — APIXABAN 5 MG PO TABS: 5.0000 mg | ORAL_TABLET | Freq: Two times a day (BID) | ORAL | 11 refills | Status: DC

## 2016-09-14 NOTE — Telephone Encounter (Signed)
Spoke with pt she states that her insurance will no longer cover her xarelto and will cover eliquis she would like to switch. Please advise

## 2016-09-14 NOTE — Telephone Encounter (Signed)
Pt would like to change her medication to eliquis her insurance no longer will cover her old medication

## 2016-09-14 NOTE — Telephone Encounter (Signed)
Pt notified verbalizes understanding of waiting 24 hours before starting new Rx-30 day free trial card at the front desk for pt to pick up

## 2016-09-14 NOTE — Telephone Encounter (Signed)
Switch to Eliquis 5 mg bid.  Patient will need to take first dose 24 hours after last dose of Xarelto.

## 2016-10-18 ENCOUNTER — Ambulatory Visit (INDEPENDENT_AMBULATORY_CARE_PROVIDER_SITE_OTHER): Payer: Medicare Other | Admitting: *Deleted

## 2016-10-18 DIAGNOSIS — I4891 Unspecified atrial fibrillation: Secondary | ICD-10-CM | POA: Diagnosis not present

## 2016-10-18 LAB — CBC WITH DIFFERENTIAL/PLATELET
Basophils Absolute: 0 cells/uL (ref 0–200)
Basophils Relative: 0 %
EOS PCT: 2 %
Eosinophils Absolute: 124 cells/uL (ref 15–500)
HCT: 37.8 % (ref 35.0–45.0)
HEMOGLOBIN: 12.7 g/dL (ref 11.7–15.5)
LYMPHS ABS: 2046 {cells}/uL (ref 850–3900)
Lymphocytes Relative: 33 %
MCH: 30.7 pg (ref 27.0–33.0)
MCHC: 33.6 g/dL (ref 32.0–36.0)
MCV: 91.3 fL (ref 80.0–100.0)
MPV: 9.8 fL (ref 7.5–12.5)
Monocytes Absolute: 372 cells/uL (ref 200–950)
Monocytes Relative: 6 %
NEUTROS PCT: 59 %
Neutro Abs: 3658 cells/uL (ref 1500–7800)
PLATELETS: 182 10*3/uL (ref 140–400)
RBC: 4.14 MIL/uL (ref 3.80–5.10)
RDW: 13.6 % (ref 11.0–15.0)
WBC: 6.2 10*3/uL (ref 3.8–10.8)

## 2016-10-18 LAB — BASIC METABOLIC PANEL
BUN: 18 mg/dL (ref 7–25)
CO2: 25 mmol/L (ref 20–31)
Calcium: 8.4 mg/dL — ABNORMAL LOW (ref 8.6–10.4)
Chloride: 107 mmol/L (ref 98–110)
Creat: 1.09 mg/dL — ABNORMAL HIGH (ref 0.60–0.93)
Glucose, Bld: 166 mg/dL — ABNORMAL HIGH (ref 65–99)
POTASSIUM: 4.2 mmol/L (ref 3.5–5.3)
SODIUM: 140 mmol/L (ref 135–146)

## 2016-10-18 NOTE — Progress Notes (Signed)
Pt was started on Xarelto 20mg  daily for Afib from 09/30/15 until 09/14/16 by Dr. Martinique then on 09/15/16 she was switched to Eliquis 5mg  twice a day for Afib switched due to cost.  Reviewed patients medication list.  Pt is not currently on any combined P-gp and strong CYP3A4 inhibitors/inducers (ketoconazole, traconazole, ritonavir, carbamazepine, phenytoin, rifampin, St. John's wort).  Reviewed labs: SCr-1.09, Weight-90.8kg, Hgb-12.7, HCT-37.8 .  Dose appropriate based on dosing criteria, age, weight, and SCr.  Hgb and HCT within normal limits.  A full discussion of the nature of anticoagulants has been carried out.  A benefit/risk analysis has been presented to the patient, so that they understand the justification for choosing anticoagulation with Eliquis at this time. The need for compliance is stressed.  Pt is aware to take the medication twice daily.  Side effects of potential bleeding are discussed, including unusual colored urine or stools, coughing up blood or coffee ground emesis, nose bleeds or serious fall or head trauma.  Discussed signs and symptoms of stroke. The patient should avoid any OTC items containing aspirin or ibuprofen.  Avoid alcohol consumption.  Call if any signs of abnormal bleeding.  Discussed financial obligations and resolved any difficulty in obtaining medication.  Next lab test test in 6 months.   10/18/16-Pt states the cost of Eliquis is $150/month and she thinks she can continue to afford it at this time. Also, she stated that if she can't she will have to result back to Coumadin. Advised to inform Dr. Martinique or [facility] so we can manage & she verbalized understanding.     10/19/16-called pt & discussed lab results & instructed pt to continue taking Eliquis 5mg  twice a day; she verbalized understanding.  Also, made an appt for the pt in 6 months while on the phone.

## 2016-11-09 ENCOUNTER — Other Ambulatory Visit: Payer: Self-pay | Admitting: Neurology

## 2016-11-11 ENCOUNTER — Other Ambulatory Visit: Payer: Self-pay | Admitting: Neurology

## 2016-11-14 NOTE — Telephone Encounter (Signed)
Called pt pharmacy. Spoke w/ Deborra Medina. Advised rx ambien 10mg  tab faxed on 11/10/16 with 5 refills. He stated they did receive on 11/10/16. Disregard refill request.

## 2016-12-27 ENCOUNTER — Ambulatory Visit (INDEPENDENT_AMBULATORY_CARE_PROVIDER_SITE_OTHER): Payer: Medicare Other | Admitting: Adult Health

## 2016-12-27 ENCOUNTER — Encounter: Payer: Self-pay | Admitting: Adult Health

## 2016-12-27 VITALS — BP 128/76 | HR 62 | Ht 63.0 in | Wt 202.2 lb

## 2016-12-27 DIAGNOSIS — F5104 Psychophysiologic insomnia: Secondary | ICD-10-CM | POA: Diagnosis not present

## 2016-12-27 DIAGNOSIS — F419 Anxiety disorder, unspecified: Secondary | ICD-10-CM

## 2016-12-27 NOTE — Progress Notes (Signed)
I agree with the assessment and plan as directed by NP .The patient is known to me .   Saburo Luger, MD  

## 2016-12-27 NOTE — Progress Notes (Signed)
PATIENT: [name] DOB: 03-01-1946  REASON FOR VISIT: follow up- insomnia HISTORY FROM: patient  HISTORY OF PRESENT ILLNESS: Today 12/27/16:  [name] is a 71 year old female with a history of chronic insomnia. She returns today for follow-up. At her last visit she was advised that she did not have to use the CPAP as it was not offering her much benefit. She states that she's been using Ambien 10 mg at bedtime. She states that this is the only medication that has offered her some benefit. She states that she still wakes up numerous times a night but she is able to go back to sleep. She reports on the rare occasion she will have a night that she cannot sleep at all. She was referred to Dr. Casimiro Needle but states that she never made an appointment. She states that her husband has had a lot of health issues and may being consumed with that for the last several months. She states that she understands that she has a lot of anxiety but she does not feel depressed. She returns today for an evaluation.  HISTORY per Dr. Brett Fairy' notes: [name] is a 71 y.o. female  Is seen here as a referral/ revisit  from Dr. Deborra Medina for chronic insomnia. The patient will be evaluated for organic reasons of insomnia.    12-2013 Note, CD  The patient was last seen on  01-20-12  and has lost a lot of weight in the meantime.  After trazodone and Remeron did feel to produce satisfying results for now she will sometimes take a Benadryl or Tylenol PM to assist her with her sleep initiation. She thinks was a night was never the main problem. The patient states that she has lost a lot of weight intentionally and that this has helped certainly some of her other health issues including hypertension. She was diagnosed with atrial fibrillation in the past has been on chronic anticoagulation. She reported that topiramate caused her to be drowsy and daytime when taken in the morning for that reason we will change the intake  time 2 PM this may assist with her sleepiness as well as was her headaches. It has also helped her to lose weight.  At bedtime routines were reviewed : the patient was to bed around 11 PM rises at 6:30 AM depending on how much sleep she got.  She did have amnestic spells from after some nights when using Ambien but she sleeps all much better on Ambien but on the other medications.  The onset of her insomnia was related to the development of menopause but she only has really hot flashes now.  Her bedroom is described as quiet, cool and dark she shares a bedroom with her husband.    Interval history , 01-13-15 CD,  [name] is seen here following up on her sleep study from 3-20 5-15 the study showed a patient with normal heart rate some prolonged oxygen desaturation and very mild apnea. A problem for the patient was that she couldn't really sleep through the night she was 53 minutes awake during her sleep study. 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 Dr. Zenia Resides had originally been referred to the patient for insomnia evaluation. Based on the hypoxemia and the mix of a mild sleep apnea , she could be invited the patient to come in for a CPAP titration split-night study. The patient is beginning cataracts and she has just been seen by optometry, she has been insomnic  again. Testing was suspected to reveal also retinal disease and she was referred to Dr. Zadie Rhine a retinal specialist. She has a macular pucker. Surgery is scheduled for tomorrow morning 01-14-15. She is in need of a refill for Ambien which he uses to treat her chronic insomnia. In spite of this the last week had been difficult for her I think because of anxiety in anticipation of the surgery. Her fatigue severity score is up to 40 points and her fatigue her Epworth sleepiness score is normal at 2 points.  Her sleep is not restorative, still. She will have an overnight pulse-oximetry after her surgery is done. I will schedule this  though [facility].  Rv with NP or me in May-June 2016.  06-04-15, [name] is here status post cataract ectomy bilaterally and surgery to the retina. As I had quoted in my last visit she underwent a sleep study on 3-20 5-15. She continued to complain of nonrestorative not refreshing sleep. She feels however that she has started to manage better with that and her fatigue is no longer as excessive. She endorsed the fatigue severity score of 31 points and the Epworth sleepiness score at 3 points. She's not likely to fall asleep unintended. She doesn't struggle with sleep attacks. In her geriatric depression score she endorsed only 1 point which is not indicative of suffering from clinical depression. She wears a fit bit, which records her pulse, she wore a ONO, and had 2 hours of low oxygen levels, she will need a referral to pulmonology for further evaluation or will proceed with a CPAP auto-titration, to see if correcting the mild apnea is an option. She needs to lose weight.   04-25-2016 [name] was last seen by mypractitioner Cecille Rubin, and referred for a new CPAP titration on 01/27/2016. She was again diagnosed with insomnia and a low sleep efficiency of only 72.5% CPAP was explored from 5 through 8 cm water. And an air-fit P 10 nasal pillow in extra small size was used. She still reports that she feels restless and uncomfortable just was having anything in her face or attached to her body. And of course the PLM arousals were not addressed by CPAP. She would like to sleep on her side but feels compelled to sleep in supine position so that the mass does not get as long.  The compliance data since May 30 show 14 out of 15 days of use she used to machine over 4 hours for only 11 out of 15 days. A 74% compliance. The average user time is 4 hours and 56 minutes. The set pressure was 8 cm water with 3 cm EPR and her residual AHI is 2.9 there is a significant reduction in her apnea index.  However her baseline AHI was not that high in 2015 with an AHI of 9. I think given that the patient feels no improvement in her daytime alertness and actually more bothered by the machine and given that she has a mild apnea to begin with I would allow her to discontinue the use of CPAP she could change to a dental device is snoring bothers her, but she denies. She will concentrate on weight loss.    REVIEW OF SYSTEMS: Out of a complete 14 system review of symptoms, the patient complains only of the following symptoms, and all other reviewed systems are negative.  Back pain, insomnia, frequent waking  ALLERGIES: Allergies  Allergen Reactions  . Codeine Nausea And Vomiting    REACTION:  vomiting    HOME MEDICATIONS: Outpatient Medications Prior to Visit  Medication Sig Dispense Refill  . apixaban (ELIQUIS) 5 MG TABS tablet Take 1 tablet (5 mg total) by mouth 2 (two) times daily. 60 tablet 11  . ketorolac (ACULAR) 0.5 % ophthalmic solution Place 0.5 drops into the left eye 4 (four) times daily.    Marland Kitchen losartan (COZAAR) 100 MG tablet TAKE 1 TABLET (100 MG TOTAL) BY MOUTH DAILY. 90 tablet 3  . metoprolol succinate (TOPROL-XL) 50 MG 24 hr tablet TAKE 1 TABLET (50 MG TOTAL) BY MOUTH DAILY. TAKE WITH OR IMMEDIATELY FOLLOWING A MEAL. 90 tablet 2  . omeprazole (PRILOSEC) 20 MG capsule TAKE 1 CAPSULE (20 MG TOTAL) BY MOUTH DAILY. 90 capsule 2  . propafenone (RYTHMOL) 300 MG tablet TAKE 1 TABLET BY MOUTH 2 TIMES DAILY. 180 tablet 3  . zolpidem (AMBIEN) 10 MG tablet TAKE ONE TABLET BY MOUTH AT BEDTIME AS NEEDED FOR SLEEP 30 tablet 5   No facility-administered medications prior to visit.     PAST MEDICAL HISTORY: Past Medical History:  Diagnosis Date  . Acute upper respiratory infections of unspecified site   . Carotid bruit   . Cataracts, both eyes   . Chronic insomnia   . Colon polyps   . Diverticulosis   . Fatty liver   . Hiatal hernia   . Hx of adenomatous colonic polyps   .  Hyperlipidemia   . Hypertension   . Insomnia, controlled 01/01/2014  . Internal hemorrhoids   . Macular pucker, left eye    surgery planned 01-14-15  . Menopause    noticed in early 71's  . Other acute sinusitis   . Other malaise and fatigue   . Paroxysmal atrial fibrillation (HCC)    on meds  . Pure hypercholesterolemia   . Restless leg syndrome   . Routine general medical examination at a health care facility   . Screening for lipoid disorders   . Sleep apnea    no c-pap  . Special screening for malignant neoplasms, colon     PAST SURGICAL HISTORY: Past Surgical History:  Procedure Laterality Date  . cardiolyte neg  6/06  . cardiovascular stress MRI  12/20/06   afib  . CHOLECYSTECTOMY    . KNEE ARTHROSCOPY     left  . ORIF- L distal radius  9/07   left wrist  . PARS PLANA VITRECTOMY W/ REPAIR OF MACULAR HOLE     left eye  . TUBAL LIGATION      FAMILY HISTORY: Family History  Problem Relation Age of Onset  . Breast cancer Mother   . Heart disease Father   . Colon cancer Cousin 56  . Diabetes      cousins    SOCIAL HISTORY: Social History   Social History  . Marital status: Married    Spouse name: Sterling Big  . Number of children: 3  . Years of education: College   Occupational History  . BOOK KEEPER [employer]   Social History Main Topics  . Smoking status: Never Smoker  . Smokeless tobacco: Never Used  . Alcohol use Yes     Comment: rarely  . Drug use: No  . Sexual activity: No   Other Topics Concern  . Not on file   Social History Narrative   Patient is married Sterling Big) and lives at home with her husband.   Patient has three adult children.   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    Patient is right-handed.   Patient drinks two cups of caffeine daily.   Patient has a college education.      PHYSICAL EXAM  Vitals:   12/27/16 1020  BP: 128/76  Pulse: 62  Weight: 202 lb 3.2 oz (91.7 kg)    Height: _0  (1.6 m)   Body mass index is 35.82 kg/m.  Generalized: Well developed, in no acute distress   Neurological examination  Mentation: Alert oriented to time, place, history taking. Follows all commands speech and language fluent Cranial nerve II-XII: Pupils were equal round reactive to light. Extraocular movements were full, visual field were full on confrontational test. Facial sensation and strength were normal. Uvula tongue midline. Head turning and shoulder shrug  were normal and symmetric. Motor: The motor testing reveals 5 over 5 strength of all 4 extremities. Good symmetric motor tone is noted throughout.  Sensory: Sensory testing is intact to soft touch on all 4 extremities. No evidence of extinction is noted.  Coordination: Cerebellar testing reveals good finger-nose-finger and heel-to-shin bilaterally.  Gait and station: Gait is normal Reflexes: Deep tendon reflexes are symmetric and normal bilaterally.   DIAGNOSTIC DATA (LABS, IMAGING, TESTING) - I reviewed patient records, labs, notes, testing and imaging myself where available.  Lab Results  Component Value Date   WBC 6.2 10/18/2016   HGB 12.7 10/18/2016   HCT 37.8 10/18/2016   MCV 91.3 10/18/2016   PLT 182 10/18/2016      Component Value Date/Time   NA 140 10/18/2016 1003   K 4.2 10/18/2016 1003   CL 107 10/18/2016 1003   CO2 25 10/18/2016 1003   GLUCOSE 166 (H) 10/18/2016 1003   BUN 18 10/18/2016 1003   CREATININE 1.09 (H) 10/18/2016 1003   CALCIUM 8.4 (L) 10/18/2016 1003   PROT 6.4 09/24/2015 1214   ALBUMIN 4.0 09/24/2015 1214   AST 17 09/24/2015 1214   ALT 22 09/24/2015 1214   ALKPHOS 91 09/24/2015 1214   BILITOT 0.6 09/24/2015 1214   GFRNONAA 76.40 12/22/2009 0933   GFRAA  02/25/2009 1020    >60        The eGFR has been calculated using the MDRD equation. This calculation has not been validated in all clinical situations. eGFR's persistently <60 mL/min signify possible Chronic Kidney  Disease.   Lab Results  Component Value Date   CHOL 198 09/24/2015   HDL 52 09/24/2015   LDLCALC 121 09/24/2015   LDLDIRECT 137.0 08/10/2010   TRIG 127 09/24/2015   CHOLHDL 3.8 09/24/2015    Lab Results  Component Value Date   TSH 2.086 08/28/2014      ASSESSMENT AND PLAN 71 y.o. year old female  has a past medical history of Acute upper respiratory infections of unspecified site; Carotid bruit; Cataracts, both eyes; Chronic insomnia; Colon polyps; Diverticulosis; Fatty liver; Hiatal hernia; adenomatous colonic polyps; Hyperlipidemia; Hypertension; Insomnia, controlled (01/01/2014); Internal hemorrhoids; Macular pucker, left eye; Menopause; Other acute sinusitis; Other malaise and fatigue; Paroxysmal atrial fibrillation ([place]); Pure hypercholesterolemia; Restless leg syndrome; Routine general medical examination at a health care facility; Screening for lipoid disorders; Sleep apnea; and Special screening for malignant neoplasms, colon. here with:  1. Chronic insomnia  2. Anxiety   The patient will continue on Ambien 10 mg at bedtime. I have encouraged the patient to make an appointment with Dr. Casimiro Needle. The patient does acknowledge that she has a lot of anxiety. This could be interfering with her sleep as well.  Patient voices understanding. She will follow-up in 6 months with Dr. Mechele Claude, MSN, NP-C 12/27/2016, 10:34 AM [facility] [address], [place], [place] [address] [phone]

## 2016-12-27 NOTE — Patient Instructions (Signed)
Continue Ambien Please Consider seeing Dr. Casimiro Needle If your symptoms worsen or you develop new symptoms please let us know.

## 2017-01-17 ENCOUNTER — Ambulatory Visit (INDEPENDENT_AMBULATORY_CARE_PROVIDER_SITE_OTHER): Payer: Medicare Other | Admitting: Family Medicine

## 2017-01-17 ENCOUNTER — Ambulatory Visit (INDEPENDENT_AMBULATORY_CARE_PROVIDER_SITE_OTHER)
Admission: RE | Admit: 2017-01-17 | Discharge: 2017-01-17 | Disposition: A | Discharge status: Home / Self Care | Payer: Medicare Other | Source: Ambulatory Visit | Attending: Family Medicine | Admitting: Family Medicine

## 2017-01-17 VITALS — BP 130/72 | HR 59 | Temp 98.0°F | Wt 201.0 lb

## 2017-01-17 DIAGNOSIS — M5440 Lumbago with sciatica, unspecified side: Secondary | ICD-10-CM

## 2017-01-17 DIAGNOSIS — M545 Low back pain: Secondary | ICD-10-CM | POA: Diagnosis not present

## 2017-01-17 NOTE — Patient Instructions (Signed)
Great to see you. I will call you with your xray results from today. 

## 2017-01-17 NOTE — Progress Notes (Signed)
Pre visit review using our clinic review tool, if applicable. No additional management support is needed unless otherwise documented below in the visit note. 

## 2017-01-17 NOTE — Progress Notes (Signed)
SUBJECTIVE:  [name] is a 71 y.o. female who complains of low back pain for 1 year(s), positional with bending or lifting, without radiation down the legs. Precipitating factors: none recalled by the patient. Prior history of back problems: recurrent self limited episodes of low back pain in the past. There is no numbness in the legs.  Current Outpatient Prescriptions on File Prior to Visit  Medication Sig Dispense Refill  . apixaban (ELIQUIS) 5 MG TABS tablet Take 1 tablet (5 mg total) by mouth 2 (two) times daily. 60 tablet 11  . ketorolac (ACULAR) 0.5 % ophthalmic solution Place 0.5 drops into the left eye 4 (four) times daily.    Marland Kitchen losartan (COZAAR) 100 MG tablet TAKE 1 TABLET (100 MG TOTAL) BY MOUTH DAILY. 90 tablet 3  . metoprolol succinate (TOPROL-XL) 50 MG 24 hr tablet TAKE 1 TABLET (50 MG TOTAL) BY MOUTH DAILY. TAKE WITH OR IMMEDIATELY FOLLOWING A MEAL. 90 tablet 2  . propafenone (RYTHMOL) 300 MG tablet TAKE 1 TABLET BY MOUTH 2 TIMES DAILY. 180 tablet 3  . zolpidem (AMBIEN) 10 MG tablet TAKE ONE TABLET BY MOUTH AT BEDTIME AS NEEDED FOR SLEEP 30 tablet 5  . omeprazole (PRILOSEC) 20 MG capsule TAKE 1 CAPSULE (20 MG TOTAL) BY MOUTH DAILY. (Patient not taking: Reported on 01/17/2017) 90 capsule 2   No current facility-administered medications on file prior to visit.     Allergies  Allergen Reactions  . Codeine Nausea And Vomiting    REACTION: vomiting    Past Medical History:  Diagnosis Date  . Acute upper respiratory infections of unspecified site   . Carotid bruit   . Cataracts, both eyes   . Chronic insomnia   . Colon polyps   . Diverticulosis   . Fatty liver   . Hiatal hernia   . Hx of adenomatous colonic polyps   . Hyperlipidemia   . Hypertension   . Insomnia, controlled 01/01/2014  . Internal hemorrhoids   . Macular pucker, left eye    surgery planned 01-14-15  . Menopause    noticed in early 75's  . Other acute sinusitis   . Other malaise and fatigue   .  Paroxysmal atrial fibrillation (HCC)    on meds  . Pure hypercholesterolemia   . Restless leg syndrome   . Routine general medical examination at a health care facility   . Screening for lipoid disorders   . Sleep apnea    no c-pap  . Special screening for malignant neoplasms, colon     Past Surgical History:  Procedure Laterality Date  . cardiolyte neg  6/06  . cardiovascular stress MRI  12/20/06   afib  . CHOLECYSTECTOMY    . KNEE ARTHROSCOPY     left  . ORIF- L distal radius  9/07   left wrist  . PARS PLANA VITRECTOMY W/ REPAIR OF MACULAR HOLE     left eye  . TUBAL LIGATION      Family History  Problem Relation Age of Onset  . Breast cancer Mother   . Heart disease Father   . Colon cancer Cousin 97  . Diabetes      cousins    Social History   Social History  . Marital status: Married    Spouse name: Sterling Big  . Number of children: 3  . Years of education: College   Occupational History  . BOOK KEEPER [employer]   Social History Main Topics  . Smoking status: Never Smoker  .  Smokeless tobacco: Never Used  . Alcohol use Yes     Comment: rarely  . Drug use: No  . Sexual activity: No   Other Topics Concern  . Not on file   Social History Narrative   Patient is married Sterling Big) and lives at home with her husband.   Patient has three adult children.   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    Patient is right-handed.   Patient drinks two cups of caffeine daily.   Patient has a college education.   The PMH, PSH, Social History, Family History, Medications, and allergies have been reviewed in [facility], and have been updated if relevant.  OBJECTIVE: BP 130/72 (BP Location: Left Arm, Patient Position: Sitting, Cuff Size: Large)   Pulse (!) 59   Temp 98 F (36.7 C) (Oral)   Wt 201 lb (91.2 kg)   SpO2 96%   BMI 35.61 kg/m   Patient appears to be in mild to moderate pain, antalgic gait noted. Lumbosacral spine area  reveals no local tenderness or mass.  Painful and reduced LS ROM noted. Straight leg raise is negative bilaterally.  DTR's, motor strength and sensation normal, including heel and toe gait.  Peripheral pulses are palpable. X-Ray: ordered, but results not yet available.  ASSESSMENT:   Back pain for over a year, getting worse-  PLAN: Possible DDD only but I am concerned for compression fx given severity of pain.  For acute pain, rest, intermittent application of heat (do not sleep on heating pad), analgesics and muscle relaxants are recommended. Discussed longer term treatment plan of prn NSAID's and discussed a home back care exercise program with flexion exercise routine. Proper lifting with avoidance of heavy lifting discussed. Consider Physical Therapy if not improving. Call or return to clinic prn if these symptoms worsen or fail to improve as anticipated.

## 2017-04-06 ENCOUNTER — Other Ambulatory Visit: Payer: Self-pay | Admitting: Cardiology

## 2017-04-06 NOTE — Telephone Encounter (Signed)
REFILL 

## 2017-04-18 ENCOUNTER — Other Ambulatory Visit: Payer: Medicare Other

## 2017-04-18 ENCOUNTER — Ambulatory Visit (INDEPENDENT_AMBULATORY_CARE_PROVIDER_SITE_OTHER): Payer: Medicare Other | Admitting: Pharmacist

## 2017-04-18 ENCOUNTER — Other Ambulatory Visit: Payer: Medicare Other | Admitting: *Deleted

## 2017-04-18 DIAGNOSIS — I4891 Unspecified atrial fibrillation: Secondary | ICD-10-CM

## 2017-04-18 DIAGNOSIS — I10 Essential (primary) hypertension: Secondary | ICD-10-CM | POA: Diagnosis not present

## 2017-04-18 DIAGNOSIS — I480 Paroxysmal atrial fibrillation: Secondary | ICD-10-CM | POA: Diagnosis not present

## 2017-04-18 LAB — BASIC METABOLIC PANEL
BUN/Creatinine Ratio: 23 (ref 12–28)
BUN: 24 mg/dL (ref 8–27)
CALCIUM: 9.2 mg/dL (ref 8.7–10.3)
CO2: 23 mmol/L (ref 20–29)
Chloride: 106 mmol/L (ref 96–106)
Creatinine, Ser: 1.04 mg/dL — ABNORMAL HIGH (ref 0.57–1.00)
GFR, EST AFRICAN AMERICAN: 62 mL/min/{1.73_m2} (ref >59)
GFR, EST NON AFRICAN AMERICAN: 54 mL/min/{1.73_m2} — AB (ref >59)
Glucose: 113 mg/dL — ABNORMAL HIGH (ref 65–99)
Potassium: 4.2 mmol/L (ref 3.5–5.2)
Sodium: 142 mmol/L (ref 134–144)

## 2017-04-18 NOTE — Progress Notes (Signed)
Pt was started on Eliquis for Afib on 09/15/16. She was previously on Xarelto.   Reviewed patients medication list.  Ptis not currently on any combined P-gp and strong CYP3A4 inhibitors/inducers (ketoconazole, traconazole, ritonavir, carbamazepine, phenytoin, rifampin, St. John's wort).  Reviewed labs.  SCr 1.04, Weight 91.2 kg, CrCl- 59mL/min.  Dose is appropriate based on age, weight, and SCr.  Hgb and HCT WNL.   A full discussion of the nature of anticoagulants has been carried out.  A benefit/risk analysis has been presented to the patient, so that they understand the justification for choosing anticoagulation with Eliquis at this time.  The need for compliance is stressed.  Pt is aware to take the medication twice daily.  Side effects of potential bleeding are discussed, including unusual colored urine or stools, coughing up blood or coffee ground emesis, nose bleeds or serious fall or head trauma.  Discussed signs and symptoms of stroke. The patient should avoid any OTC items containing aspirin or ibuprofen.  Avoid alcohol consumption.   Call if any signs of abnormal bleeding.  Discussed financial obligations and resolved any difficulty in obtaining medication.  Next lab test in 6 months.   Advised to call if she runs into issues with cost. She states RX is currently affordable for her but will be expensive when she hits donut hole late in the year.

## 2017-04-19 ENCOUNTER — Other Ambulatory Visit: Payer: Medicare Other | Admitting: *Deleted

## 2017-04-19 DIAGNOSIS — I480 Paroxysmal atrial fibrillation: Secondary | ICD-10-CM | POA: Diagnosis not present

## 2017-04-19 LAB — CBC WITH DIFFERENTIAL/PLATELET
BASOS: 1 %
Basophils Absolute: 0 10*3/uL (ref 0.0–0.2)
EOS (ABSOLUTE): 0.2 10*3/uL (ref 0.0–0.4)
EOS: 2 %
HEMOGLOBIN: 13.2 g/dL (ref 11.1–15.9)
Hematocrit: 38.1 % (ref 34.0–46.6)
Lymphocytes Absolute: 2.9 10*3/uL (ref 0.7–3.1)
Lymphs: 40 %
MCH: 30.6 pg (ref 26.6–33.0)
MCHC: 34.6 g/dL (ref 31.5–35.7)
MCV: 88 fL (ref 79–97)
MONOS ABS: 0.7 10*3/uL (ref 0.1–0.9)
Monocytes: 9 %
NEUTROS ABS: 3.5 10*3/uL (ref 1.4–7.0)
NEUTROS PCT: 48 %
Platelets: 192 10*3/uL (ref 150–379)
RBC: 4.32 x10E6/uL (ref 3.77–5.28)
RDW: 13.1 % (ref 12.3–15.4)
WBC: 7.3 10*3/uL (ref 3.4–10.8)

## 2017-04-22 NOTE — Progress Notes (Signed)
History of Present Illness: [name] is seen today to followup of atrial fibrillation and dyspnea. She has a history of paroxysmal atrial fibrillation managed with Rhythmol and metoprolol. She is on Xarelto for anticoagulation.  In October 2015 she complained of increased palpitations and her Rhythmol dose was increased with good response. She complained of increased dyspnea and had a normal stress Myoview. Echo showed normal LV function with mild MR and mild Pulmonary HTN.  On follow up today she is doing well from a cardiac standpoint.  She has rare episodes of AFib lasting less than 30 minutes.  She does note a lot of back pain and has been diagnosed with DJD. She has been taking ibuprofen - states she just can't function without it.  No bruising or bleeding noted.   Past Medical History:  Diagnosis Date  . Acute upper respiratory infections of unspecified site   . Carotid bruit   . Cataracts, both eyes   . Chronic insomnia   . Colon polyps   . Diverticulosis   . Fatty liver   . Hiatal hernia   . Hx of adenomatous colonic polyps   . Hyperlipidemia   . Hypertension   . Insomnia, controlled 01/01/2014  . Internal hemorrhoids   . Macular pucker, left eye    surgery planned 01-14-15  . Menopause    noticed in early 65's  . Other acute sinusitis   . Other malaise and fatigue   . Paroxysmal atrial fibrillation (HCC)    on meds  . Pure hypercholesterolemia   . Restless leg syndrome   . Routine general medical examination at a health care facility   . Screening for lipoid disorders   . Sleep apnea    no c-pap  . Special screening for malignant neoplasms, colon     Current Outpatient Prescriptions  Medication Sig Dispense Refill  . apixaban (ELIQUIS) 5 MG TABS tablet Take 1 tablet (5 mg total) by mouth 2 (two) times daily. 60 tablet 11  . losartan (COZAAR) 100 MG tablet TAKE 1 TABLET (100 MG TOTAL) BY MOUTH DAILY. 90 tablet 3  . metoprolol succinate (TOPROL-XL) 50 MG 24 hr  tablet Take 1 tablet (50 mg total) by mouth daily. NEED OV. 90 tablet 0  . propafenone (RYTHMOL) 300 MG tablet TAKE 1 TABLET BY MOUTH 2 TIMES DAILY. 180 tablet 3  . zolpidem (AMBIEN) 10 MG tablet TAKE ONE TABLET BY MOUTH AT BEDTIME AS NEEDED FOR SLEEP 30 tablet 5  . celecoxib (CELEBREX) 100 MG capsule Take 1 capsule (100 mg total) by mouth 2 (two) times daily. 60 capsule 11   No current facility-administered medications for this visit.     Allergies: Allergies  Allergen Reactions  . Codeine Nausea And Vomiting    REACTION: vomiting   Review of systems: As noted in history of present illness. All other review of systems are reviewed and are negative.  Vital Signs: BP 130/66 (BP Location: Left Arm)   Pulse (!) 56   Ht 5\' 3"  (1.6 m)   Wt 200 lb 9.6 oz (91 kg)   BMI 35.53 kg/m   PHYSICAL EXAM: Well nourished, obese, in no acute distress  HEENT: normal  Neck: no JVD or bruits  Cardiac:  normal S1, S2; RRR; no murmur  Lungs:  clear to auscultation bilaterally, no wheezing, rhonchi or rales  Abd: soft, nontender, no hepatomegaly  Ext: no edema  Skin: warm and dry  Neuro:  CNs 2-12 intact, no focal abnormalities noted  Laboratory data:  Lab Results  Component Value Date   WBC 7.3 04/19/2017   HGB 13.2 04/19/2017   HCT 38.1 04/19/2017   PLT 192 04/19/2017   GLUCOSE 113 (H) 04/18/2017   CHOL 198 09/24/2015   TRIG 127 09/24/2015   HDL 52 09/24/2015   LDLDIRECT 137.0 08/10/2010   LDLCALC 121 09/24/2015   ALT 22 09/24/2015   AST 17 09/24/2015   NA 142 04/18/2017   K 4.2 04/18/2017   CL 106 04/18/2017   CREATININE 1.04 (H) 04/18/2017   BUN 24 04/18/2017   CO2 23 04/18/2017   TSH 2.086 08/28/2014   INR 3.2 09/28/2015   Ecg today shows sinus brady. Otherwise normal. I have personally reviewed and interpreted this study.   ASSESSMENT AND PLAN:  1. Paroxysmal atrial fibrillation. This appears to be  well controlled on  Rythmol. She is on chronic anticoagulation with  Xarelto 20 mg daily. Since she needs NSAID for her chronic back pain I would prefer she take a COX 2 inhibitor and will give her Celebrex 100 mg bid. Stop ibuprofen.   2. Hypertension. Blood pressure is well controlled.  Follow up in 6 months.

## 2017-04-25 ENCOUNTER — Encounter: Payer: Self-pay | Admitting: Cardiology

## 2017-04-25 ENCOUNTER — Ambulatory Visit (INDEPENDENT_AMBULATORY_CARE_PROVIDER_SITE_OTHER): Payer: Medicare Other | Admitting: Cardiology

## 2017-04-25 VITALS — BP 130/66 | HR 56 | Ht 63.0 in | Wt 200.6 lb

## 2017-04-25 DIAGNOSIS — I480 Paroxysmal atrial fibrillation: Secondary | ICD-10-CM | POA: Diagnosis not present

## 2017-04-25 DIAGNOSIS — I10 Essential (primary) hypertension: Secondary | ICD-10-CM

## 2017-04-25 MED ORDER — CELECOXIB 100 MG PO CAPS: 100.0000 mg | ORAL_CAPSULE | Freq: Two times a day (BID) | ORAL | 11 refills | Status: DC

## 2017-04-25 NOTE — Patient Instructions (Addendum)
Stop taking ibuprofen.  If you need to take something for pain I would prefer Celebrex 100 mg twice a day  Continue your other therapy  I will see you in 6 months

## 2017-04-30 ENCOUNTER — Other Ambulatory Visit: Payer: Self-pay | Admitting: Neurology

## 2017-05-08 ENCOUNTER — Other Ambulatory Visit: Payer: Self-pay | Admitting: Neurology

## 2017-05-08 MED ORDER — ZOLPIDEM TARTRATE 10 MG PO TABS: 10.0000 mg | ORAL_TABLET | Freq: Every evening | ORAL | 5 refills | Status: DC | PRN

## 2017-05-08 NOTE — Addendum Note (Signed)
Addended by: Darleen Crocker on: 05/08/2017 10:33 AM   Modules accepted: Orders

## 2017-05-08 NOTE — Telephone Encounter (Signed)
fax'd to pharmacy

## 2017-05-08 NOTE — Telephone Encounter (Signed)
Patient called office requesting refill for zolpidem (AMBIEN) 10 MG tablet.  Patient states last fill date on bottle was 04/07/17, when called pharmacy to refill over phone states no more refills.  Pharmacy-  [facility] ([place])

## 2017-05-26 ENCOUNTER — Other Ambulatory Visit: Payer: Self-pay

## 2017-07-05 ENCOUNTER — Other Ambulatory Visit: Payer: Self-pay | Admitting: Cardiology

## 2017-09-10 ENCOUNTER — Other Ambulatory Visit: Payer: Self-pay | Admitting: Cardiology

## 2017-09-22 ENCOUNTER — Other Ambulatory Visit: Payer: Self-pay | Admitting: Cardiology

## 2017-09-26 ENCOUNTER — Other Ambulatory Visit: Payer: Self-pay | Admitting: Cardiology

## 2017-10-02 ENCOUNTER — Other Ambulatory Visit: Payer: Self-pay | Admitting: *Deleted

## 2017-10-02 MED ORDER — METOPROLOL SUCCINATE ER 50 MG PO TB24: 50.0000 mg | ORAL_TABLET | Freq: Every day | ORAL | 0 refills | Status: DC

## 2017-11-02 ENCOUNTER — Other Ambulatory Visit: Payer: Self-pay | Admitting: Neurology

## 2017-11-29 NOTE — Progress Notes (Signed)
History of Present Illness: [name] is seen today to followup of atrial fibrillation and dyspnea. She has a history of paroxysmal atrial fibrillation managed with Rhythmol and metoprolol. She is on Eliquis for anticoagulation.  In October 2015 she complained of increased palpitations and her Rhythmol dose was increased with good response. She complained of increased dyspnea and had a normal stress Myoview. Echo showed normal LV function with mild MR and mild Pulmonary HTN.  On follow up today she is doing well from a cardiac standpoint.  She has rare episodes of AFib. She only notes 2 episodes over the past 6 months- one lasting several hours.  She does have chronic back pain and has been diagnosed with DJD. Now taking Celebrex rather than ibuprofen. She has not taken losartan for the last month due to recall. Notes BP goes up to 190 in the pm.   Past Medical History:  Diagnosis Date  . Acute upper respiratory infections of unspecified site   . Carotid bruit   . Cataracts, both eyes   . Chronic insomnia   . Colon polyps   . Diverticulosis   . Fatty liver   . Hiatal hernia   . Hx of adenomatous colonic polyps   . Hyperlipidemia   . Hypertension   . Insomnia, controlled 01/01/2014  . Internal hemorrhoids   . Macular pucker, left eye    surgery planned 01-14-15  . Menopause    noticed in early 44's  . Other acute sinusitis   . Other malaise and fatigue   . Paroxysmal atrial fibrillation (HCC)    on meds  . Pure hypercholesterolemia   . Restless leg syndrome   . Routine general medical examination at a health care facility   . Screening for lipoid disorders   . Sleep apnea    no c-pap  . Special screening for malignant neoplasms, colon     Current Outpatient Medications  Medication Sig Dispense Refill  . celecoxib (CELEBREX) 100 MG capsule Take 1 capsule (100 mg total) by mouth 2 (two) times daily. 60 capsule 11  . ELIQUIS 5 MG TABS tablet TAKE 1 TABLET (5 MG TOTAL) BY MOUTH  2 (TWO) TIMES DAILY. 60 tablet 2  . metoprolol succinate (TOPROL-XL) 50 MG 24 hr tablet Take 1 tablet (50 mg total) by mouth daily. NEED OV. 90 tablet 0  . propafenone (RYTHMOL) 300 MG tablet TAKE 1 TABLET BY MOUTH 2 TIMES DAILY. 180 tablet 3  . zolpidem (AMBIEN) 10 MG tablet TAKE ONE TABLET BY MOUTH AT BEDTIME AS NEEDED FOR SLEEP 30 tablet 2  . losartan (COZAAR) 100 MG tablet Take 1 tablet (100 mg total) by mouth daily. 90 tablet 3   No current facility-administered medications for this visit.     Allergies: Allergies  Allergen Reactions  . Codeine Nausea And Vomiting    REACTION: vomiting   Review of systems: As noted in history of present illness. All other review of systems are reviewed and are negative.  Vital Signs: BP 134/78   Pulse (!) 58   Ht 5\' 3"  (1.6 m)   Wt 192 lb (87.1 kg)   BMI 34.01 kg/m   PHYSICAL EXAM: GENERAL:  Well appearing HEENT:  PERRL, EOMI, sclera are clear. Oropharynx is clear. NECK:  No jugular venous distention, carotid upstroke brisk and symmetric, no bruits, no thyromegaly or adenopathy LUNGS:  Clear to auscultation bilaterally CHEST:  Unremarkable HEART:  RRR,  PMI not displaced or sustained,S1 and S2 within normal limits,  no S3, no S4: no clicks, no rubs, no murmurs ABD:  Soft, nontender. BS +, no masses or bruits. No hepatomegaly, no splenomegaly EXT:  2 + pulses throughout, no edema, no cyanosis no clubbing SKIN:  Warm and dry.  No rashes NEURO:  Alert and oriented x 3. Cranial nerves II through XII intact. PSYCH:  Cognitively intact    Laboratory data:  Lab Results  Component Value Date   WBC 7.3 04/19/2017   HGB 13.2 04/19/2017   HCT 38.1 04/19/2017   PLT 192 04/19/2017   GLUCOSE 113 (H) 04/18/2017   CHOL 198 09/24/2015   TRIG 127 09/24/2015   HDL 52 09/24/2015   LDLDIRECT 137.0 08/10/2010   LDLCALC 121 09/24/2015   ALT 22 09/24/2015   AST 17 09/24/2015   NA 142 04/18/2017   K 4.2 04/18/2017   CL 106 04/18/2017    CREATININE 1.04 (H) 04/18/2017   BUN 24 04/18/2017   CO2 23 04/18/2017   TSH 2.086 08/28/2014   INR 3.2 09/28/2015     ASSESSMENT AND PLAN:  1. Paroxysmal atrial fibrillation. This is  well controlled on  Rythmol. She is on chronic anticoagulation with Eliquis.  Continue current therapy  2. Hypertension. Blood pressure is OK today but elevated at night. Will resume losartan 100 mg daily.   Follow up in 6 months.

## 2017-12-05 ENCOUNTER — Ambulatory Visit (INDEPENDENT_AMBULATORY_CARE_PROVIDER_SITE_OTHER): Payer: Medicare Other | Admitting: Cardiology

## 2017-12-05 ENCOUNTER — Encounter: Payer: Self-pay | Admitting: Cardiology

## 2017-12-05 VITALS — BP 134/78 | HR 58 | Ht 63.0 in | Wt 192.0 lb

## 2017-12-05 DIAGNOSIS — I10 Essential (primary) hypertension: Secondary | ICD-10-CM | POA: Diagnosis not present

## 2017-12-05 DIAGNOSIS — I480 Paroxysmal atrial fibrillation: Secondary | ICD-10-CM | POA: Diagnosis not present

## 2017-12-05 MED ORDER — LOSARTAN POTASSIUM 100 MG PO TABS: 100.0000 mg | ORAL_TABLET | Freq: Every day | ORAL | 3 refills | Status: DC

## 2017-12-05 NOTE — Patient Instructions (Signed)
Continue your current therapy and resume losartan 100 mg daily  I will see you in 6 months.

## 2017-12-13 DIAGNOSIS — Z9889 Other specified postprocedural states: Secondary | ICD-10-CM | POA: Diagnosis not present

## 2017-12-13 DIAGNOSIS — H35371 Puckering of macula, right eye: Secondary | ICD-10-CM | POA: Diagnosis not present

## 2017-12-13 DIAGNOSIS — Z961 Presence of intraocular lens: Secondary | ICD-10-CM | POA: Diagnosis not present

## 2017-12-13 DIAGNOSIS — H26492 Other secondary cataract, left eye: Secondary | ICD-10-CM | POA: Diagnosis not present

## 2017-12-25 DIAGNOSIS — H26492 Other secondary cataract, left eye: Secondary | ICD-10-CM | POA: Diagnosis not present

## 2017-12-28 ENCOUNTER — Other Ambulatory Visit: Payer: Self-pay | Admitting: *Deleted

## 2017-12-28 MED ORDER — METOPROLOL SUCCINATE ER 50 MG PO TB24: 50.0000 mg | ORAL_TABLET | Freq: Every day | ORAL | 0 refills | Status: DC

## 2018-01-05 DIAGNOSIS — L570 Actinic keratosis: Secondary | ICD-10-CM | POA: Diagnosis not present

## 2018-01-05 DIAGNOSIS — L820 Inflamed seborrheic keratosis: Secondary | ICD-10-CM | POA: Diagnosis not present

## 2018-01-05 DIAGNOSIS — B078 Other viral warts: Secondary | ICD-10-CM | POA: Diagnosis not present

## 2018-01-05 DIAGNOSIS — X32XXXD Exposure to sunlight, subsequent encounter: Secondary | ICD-10-CM | POA: Diagnosis not present

## 2018-01-15 ENCOUNTER — Encounter: Payer: Self-pay | Admitting: Neurology

## 2018-01-15 ENCOUNTER — Ambulatory Visit (INDEPENDENT_AMBULATORY_CARE_PROVIDER_SITE_OTHER): Payer: Medicare Other | Admitting: Neurology

## 2018-01-15 VITALS — BP 173/85 | HR 56 | Ht 63.0 in | Wt 196.0 lb

## 2018-01-15 DIAGNOSIS — F5104 Psychophysiologic insomnia: Secondary | ICD-10-CM | POA: Diagnosis not present

## 2018-01-15 MED ORDER — ZOLPIDEM TARTRATE 10 MG PO TABS: 10.0000 mg | ORAL_TABLET | Freq: Every evening | ORAL | 2 refills | Status: DC | PRN

## 2018-01-15 NOTE — Progress Notes (Signed)
PATIENT: [name] DOB: 10-19-1946  REASON FOR VISIT: follow up- insomnia HISTORY FROM: patient  HISTORY OF PRESENT ILLNESS:  01-15-2018, RV for this 72 year old caucasian married female with chronic insomnia- and who has learnt to love with it. She reports bedtime is around 11.30 pm, sleeping for about 7 hours total.  Her husband is on chemotherapy and tolerating it well, he takes naps in daytime, she doesn't.  She has lost weight again, and has not gained back over the last 12 month. She reports her cardiologist is happy with her, too. Her endorsing the Epworth score at only 2 points, FSS 23,   Today 12/27/16:  [name] is a 72 year old female with a history of chronic insomnia. She returns today for follow-up. At her last visit she was advised that she did not have to use the CPAP as it was not offering her much benefit. She states that she's been using Ambien 10 mg at bedtime. She states that this is the only medication that has offered her some benefit. She states that she still wakes up numerous times a night but she is able to go back to sleep. She reports on the rare occasion she will have a night that she cannot sleep at all. She was referred to Dr. Casimiro Needle but states that she never made an appointment. She states that her husband has had a lot of health issues and may being consumed with that for the last several months. She states that she understands that she has a lot of anxiety but she does not feel depressed. She returns today for an evaluation.  HISTORY  01-20-2012 per Dr. Brett Fairy' notes: [name] is a 21 female and seen here in referral  from Dr. Deborra Medina for chronic insomnia. The patient will be evaluated for organic reasons of insomnia.   The patient was last seen on  01-20-12  and has lost a lot of weight in the meantime. After trazodone and Remeron did fail to produce satisfying results for now she will sometimes take a Benadryl or Tylenol PM to assist her with  her sleep initiation. She thinks nocturia  was never the main problem. The patient states that she has lost a lot of weight intentionally and that this has helped certainly some of her other health issues including hypertension. She was diagnosed with atrial fibrillation in the past has been on chronic anticoagulation. She reported that topiramate caused her to be drowsy and daytime when taken in the morning for that reason we will change the intake time 2 PM this may assist with her sleepiness as well as was her headaches. It has also helped her to lose weight.  At bedtime routines were reviewed : the patient was to bed around 11 PM rises at 6:30 AM depending on how much sleep she got.  She did have amnestic spells from after some nights when using Ambien but she sleeps all much better on Ambien but on the other medications.  The onset of her insomnia was related to the development of menopause but she only has really hot flashes now.  Her bedroom is described as quiet, cool and dark she shares a bedroom with her husband.    Interval history , 01-13-15 CD,  [name] is seen here following up on her sleep study from 3-20 5-15 the study showed a patient with normal heart rate some prolonged oxygen desaturation and very mild apnea. A problem for the patient was that  she couldn't really sleep through the night she was 53 minutes awake during her sleep study. 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 Dr. Zenia Resides had originally been referred to the patient for insomnia evaluation. Based on the hypoxemia and the mix of a mild sleep apnea , she could be invited the patient to come in for a CPAP titration split-night study. The patient is beginning cataracts and she has just been seen by optometry, she has been insomnic again. Testing was suspected to reveal also retinal disease and she was referred to Dr. Zadie Rhine a retinal specialist. She has a macular pucker. Surgery is scheduled for tomorrow  morning 01-14-15. She is in need of a refill for Ambien which he uses to treat her chronic insomnia. In spite of this the last week had been difficult for her I think because of anxiety in anticipation of the surgery. Her fatigue severity score is up to 40 points and her fatigue her Epworth sleepiness score is normal at 2 points.  Her sleep is not restorative, still. She will have an overnight pulse-oximetry after her surgery is done. I will schedule this though [facility].  Rv with NP or me in May-June 2016.  06-04-15, [name] is here status post cataract ectomy bilaterally and surgery to the retina. As I had quoted in my last visit she underwent a sleep study on 3-20 5-15. She continued to complain of nonrestorative not refreshing sleep. She feels however that she has started to manage better with that and her fatigue is no longer as excessive. She endorsed the fatigue severity score of 31 points and the Epworth sleepiness score at 3 points. She's not likely to fall asleep unintended. She doesn't struggle with sleep attacks. In her geriatric depression score she endorsed only 1 point which is not indicative of suffering from clinical depression. She wears a fit bit, which records her pulse, she wore a ONO, and had 2 hours of low oxygen levels, she will need a referral to pulmonology for further evaluation or will proceed with a CPAP auto-titration, to see if correcting the mild apnea is an option. She needs to lose weight.   04-25-2016 [name] was last seen by mypractitioner Cecille Rubin, and referred for a new CPAP titration on 01/27/2016. She was again diagnosed with insomnia and a low sleep efficiency of only 72.5% CPAP was explored from 5 through 8 cm water. And an air-fit P 10 nasal pillow in extra small size was used. She still reports that she feels restless and uncomfortable just was having anything in her face or attached to her body. And of course the PLM arousals were not  addressed by CPAP. She would like to sleep on her side but feels compelled to sleep in supine position so that the mass does not get as long.  The compliance data since May 30 show 14 out of 15 days of use she used to machine over 4 hours for only 11 out of 15 days. A 74% compliance. The average user time is 4 hours and 56 minutes. The set pressure was 8 cm water with 3 cm EPR and her residual AHI is 2.9 there is a significant reduction in her apnea index. However her baseline AHI was not that high in 2015 with an AHI of 9. I think given that the patient feels no improvement in her daytime alertness and actually more bothered by the machine and given that she has a mild apnea to begin with I  would allow her to discontinue the use of CPAP she could change to a dental device is snoring bothers her, but she denies. She will concentrate on weight loss.    REVIEW OF SYSTEMS: Out of a complete 14 system review of symptoms, the patient complains only of the following symptoms, and all other reviewed systems are negative.  Back pain, insomnia, frequent waking  ALLERGIES: Allergies  Allergen Reactions  . Codeine Nausea And Vomiting    REACTION: vomiting    HOME MEDICATIONS: Outpatient Medications Prior to Visit  Medication Sig Dispense Refill  . celecoxib (CELEBREX) 100 MG capsule Take 1 capsule (100 mg total) by mouth 2 (two) times daily. 60 capsule 11  . ELIQUIS 5 MG TABS tablet TAKE 1 TABLET (5 MG TOTAL) BY MOUTH 2 (TWO) TIMES DAILY. 60 tablet 2  . losartan (COZAAR) 100 MG tablet Take 1 tablet (100 mg total) by mouth daily. 90 tablet 3  . metoprolol succinate (TOPROL-XL) 50 MG 24 hr tablet Take 1 tablet (50 mg total) by mouth daily. 90 tablet 0  . propafenone (RYTHMOL) 300 MG tablet TAKE 1 TABLET BY MOUTH 2 TIMES DAILY. 180 tablet 3  . zolpidem (AMBIEN) 10 MG tablet TAKE ONE TABLET BY MOUTH AT BEDTIME AS NEEDED FOR SLEEP 30 tablet 2   No facility-administered medications prior to visit.      PAST MEDICAL HISTORY: Past Medical History:  Diagnosis Date  . Acute upper respiratory infections of unspecified site   . Carotid bruit   . Cataracts, both eyes   . Chronic insomnia   . Colon polyps   . Diverticulosis   . Fatty liver   . Hiatal hernia   . Hx of adenomatous colonic polyps   . Hyperlipidemia   . Hypertension   . Insomnia, controlled 01/01/2014  . Internal hemorrhoids   . Macular pucker, left eye    surgery planned 01-14-15  . Menopause    noticed in early 36's  . Other acute sinusitis   . Other malaise and fatigue   . Paroxysmal atrial fibrillation (HCC)    on meds  . Pure hypercholesterolemia   . Restless leg syndrome   . Routine general medical examination at a health care facility   . Screening for lipoid disorders   . Sleep apnea    no c-pap  . Special screening for malignant neoplasms, colon     PAST SURGICAL HISTORY: Past Surgical History:  Procedure Laterality Date  . cardiolyte neg  6/06  . cardiovascular stress MRI  12/20/06   afib  . CHOLECYSTECTOMY    . KNEE ARTHROSCOPY     left  . ORIF- L distal radius  9/07   left wrist  . PARS PLANA VITRECTOMY W/ REPAIR OF MACULAR HOLE     left eye  . TUBAL LIGATION      FAMILY HISTORY: Family History  Problem Relation Age of Onset  . Breast cancer Mother   . Heart disease Father   . Colon cancer Cousin 31  . Diabetes Unknown        cousins    SOCIAL HISTORY: Social History   Socioeconomic History  . Marital status: Married    Spouse name: Sterling Big  . Number of children: 3  . Years of education: College  . Highest education level: Not on file  Social Needs  . Financial resource strain: Not on file  . Food insecurity - worry: Not on file  . Food insecurity - inability: Not on file  .  Transportation needs - medical: Not on file  . Transportation needs - non-medical: Not on file  Occupational History  . Occupation: Product manager: [employer]  Tobacco Use  . Smoking  status: Never Smoker  . Smokeless tobacco: Never Used  Substance and Sexual Activity  . Alcohol use: Yes    Comment: rarely  . Drug use: No  . Sexual activity: No  Other Topics Concern  . Not on file  Social History Narrative   Patient is married Sterling Big) and lives at home with her husband.   Patient has three adult children.   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    Patient is right-handed.   Patient drinks two cups of caffeine daily.   Patient has a college education.      PHYSICAL EXAM  Vitals:   01/15/18 0808  BP: (!) 173/85  Pulse: (!) 56  Weight: 196 lb (88.9 kg)  Height: 5\' 3"  (1.6 m)   Body mass index is 34.72 kg/m.  Physical exam:  General: The patient is awake, alert and appears not in acute distress. The patient is well groomed. She uses often a 5 mg tab -  Head: Normocephalic, atraumatic. Neck is supple. Mallampati 3 , neck circumference:15 , Cardiovascular:  irregular rate and rhythm , without  murmurs or carotid bruit, and without distended neck veins. Respiratory: Lungs are clear to auscultation. Skin:  Without evidence of edema, or rash Trunk: BMI is elevated.  Neurologic exam : The patient is awake and alert, oriented to place and time.  Memory subjective described as intact. There is a normal attention span & concentration ability. Speech is fluent without  dysarthria, dysphonia or aphasia. Mood and affect are appropriate.  Cranial nerves: Pupils are equal and briskly reactive to light, she has left over right ptosis- the eyelid straddles the pupil. Retinal surgery. Marland Kitchen  Hearing to finger rub intact.  Facial sensation intact to fine touch.  Facial motor strength is symmetric and tongue and uvula move midline.  Assessment:  After physical and neurologic examination, review of laboratory studies, imaging, neurophysiology testing and pre-existing records, assessment is   1) likely Insomnia with mild OSA -  patient reported reluctantly about her snoring and waking up with a dry mouth.  No morning headaches , no nocturia.    2) BMI is still she continues to be chronically anticoagulated for  atrial fibrillation.  3) No headaches on topiramate, which helped with weight loss, too.    Plan:  Treatment plan and additional workup : 1)  will allow with medical consent to d/c the CPAP use.  Refilled Ambien prn, and Topiramate.   DIAGNOSTIC DATA (LABS, IMAGING, TESTING) - I reviewed patient records, labs, notes, testing and imaging myself where available.  Lab Results  Component Value Date   WBC 7.3 04/19/2017   HGB 13.2 04/19/2017   HCT 38.1 04/19/2017   MCV 88 04/19/2017   PLT 192 04/19/2017    Lab Results  Component Value Date   CHOL 198 09/24/2015   HDL 52 09/24/2015   LDLCALC 121 09/24/2015   LDLDIRECT 137.0 08/10/2010   TRIG 127 09/24/2015   CHOLHDL 3.8 09/24/2015    Lab Results  Component Value Date   TSH 2.086 08/28/2014      ASSESSMENT AND PLAN:  1. Chronic insomnia , mild and well controlled on Ambien5-10 mg at bedtime  2. Anxiety is improved ,  3. Atrial fib ,  rate controlled.    The patient will continue on Ambien 5-10 mg at bedtime. I have again encouraged the patient to make an appointment with Dr. Casimiro Needle. The patient does acknowledge that she has a lot of anxiety. This could be interfering with her sleep as well. Patient voices understanding. She will follow-up in 59months with NP   Larey Seat, MD  01/15/2018, 8:38 AM [facility] [address] [place], [place] [address] [phone]

## 2018-01-17 ENCOUNTER — Ambulatory Visit: Payer: Medicare Other | Admitting: Neurology

## 2018-01-22 DIAGNOSIS — H35371 Puckering of macula, right eye: Secondary | ICD-10-CM | POA: Diagnosis not present

## 2018-01-22 DIAGNOSIS — Z09 Encounter for follow-up examination after completed treatment for conditions other than malignant neoplasm: Secondary | ICD-10-CM | POA: Diagnosis not present

## 2018-03-01 ENCOUNTER — Other Ambulatory Visit: Payer: Self-pay | Admitting: Cardiology

## 2018-03-29 ENCOUNTER — Other Ambulatory Visit: Payer: Self-pay | Admitting: Cardiology

## 2018-04-29 ENCOUNTER — Other Ambulatory Visit: Payer: Self-pay | Admitting: Neurology

## 2018-05-26 ENCOUNTER — Other Ambulatory Visit: Payer: Self-pay | Admitting: Cardiology

## 2018-05-26 DIAGNOSIS — I480 Paroxysmal atrial fibrillation: Secondary | ICD-10-CM

## 2018-05-27 ENCOUNTER — Other Ambulatory Visit: Payer: Self-pay | Admitting: Cardiology

## 2018-06-29 ENCOUNTER — Other Ambulatory Visit: Payer: Self-pay | Admitting: Cardiology

## 2018-07-18 ENCOUNTER — Ambulatory Visit (INDEPENDENT_AMBULATORY_CARE_PROVIDER_SITE_OTHER): Payer: Medicare Other | Admitting: Neurology

## 2018-07-18 ENCOUNTER — Encounter: Payer: Self-pay | Admitting: Neurology

## 2018-07-18 VITALS — BP 153/73 | HR 52 | Ht 63.0 in | Wt 195.0 lb

## 2018-07-18 DIAGNOSIS — F5104 Psychophysiologic insomnia: Secondary | ICD-10-CM | POA: Diagnosis not present

## 2018-07-18 DIAGNOSIS — E6609 Other obesity due to excess calories: Secondary | ICD-10-CM | POA: Diagnosis not present

## 2018-07-18 DIAGNOSIS — F418 Other specified anxiety disorders: Secondary | ICD-10-CM

## 2018-07-18 MED ORDER — ZOLPIDEM TARTRATE 10 MG PO TABS: ORAL_TABLET | ORAL | 0 refills | Status: DC

## 2018-07-18 NOTE — Progress Notes (Signed)
PATIENT: [name] DOB: [birth year]  REASON FOR VISIT: follow up- insomnia HISTORY FROM: patient  HISTORY OF PRESENT ILLNESS:   07-18-2018, Rv with [name] -a 72 year old female patient who has become her husbands caretaker.   She endorses today the fatigue severity scale at 29 points, and her Epworth sleepiness score at 2 points.  The patient has suffered for a long time but is chronic insomnia, and underlying condition that has been treated with Ambien at 10 mg.  And she continues to have a.  Of wakefulness somewhere between 2 and 3 AM, this is also correlated to her Fitbit recording.  Her husband continues to receive chemotherapy, she had been successful in losing some weight but has been stable, certainly stress related.    01-15-2018, RV for this 72 year old caucasian married female with chronic insomnia- and who has learnt to live with it. She reports bedtime is around 11.30 pm, sleeping for about 7 hours total.  Her husband is on chemotherapy and tolerating it well, he takes naps in daytime, she doesn't.  She has lost weight again, and has not gained back over the last 12 month. She reports her cardiologist is happy with her, too. Her endorsing the Epworth score at only 2 points, FSS 23,   Today 12/27/16: [name] is a 72 year old female with a history of chronic insomnia. She returns today for follow-up. At her last visit she was advised that she did not have to use the CPAP as it was not offering her much benefit. She states that she's been using Ambien 10 mg at bedtime. She states that this is the only medication that has offered her some benefit. She states that she still wakes up numerous times a night but she is able to go back to sleep. She reports on the rare occasion she will have a night that she cannot sleep at all. She was referred to Dr. Casimiro Needle but states that she never made an appointment. She states that her husband has had a lot of health issues and may being  consumed with that for the last several months. She states that she understands that she has a lot of anxiety but she does not feel depressed. She returns today for an evaluation.  HISTORY  01-20-2012 per Dr. Brett Fairy' notes: [name] is a 1 female and seen here in referral  from Dr. Deborra Medina for chronic insomnia. The patient will be evaluated for organic reasons of insomnia.   The patient was last seen on  01-20-12  and has lost a lot of weight in the meantime. After trazodone and Remeron did fail to produce satisfying results for now she will sometimes take a Benadryl or Tylenol PM to assist her with her sleep initiation. She thinks nocturia  was never the main problem. The patient states that she has lost a lot of weight intentionally and that this has helped certainly some of her other health issues including hypertension. She was diagnosed with atrial fibrillation in the past has been on chronic anticoagulation. She reported that topiramate caused her to be drowsy and daytime when taken in the morning for that reason we will change the intake time 2 PM this may assist with her sleepiness as well as was her headaches. It has also helped her to lose weight.  At bedtime routines were reviewed : the patient was to bed around 11 PM rises at 6:30 AM depending on how much sleep she got.  She did have amnestic spells from after some nights when using Ambien- but she sleeps all much better on Ambien but on the other medications.  The onset of her insomnia was related to the development of menopause but she only has really hot flashes now- she uses Ambien chronically for 2 decades.   Her bedroom is described as quiet, cool and dark she shares a bedroom with her husband, who is very sick.    Interval history , 01-13-15 CD, [name] is seen here following up on her sleep study from 01-29-14 the study showed a patient with normal heart rate some prolonged oxygen desaturation and very mild apnea. A problem  for the patient was that she couldn't really sleep through the night she was 53 minutes awake during her sleep study. 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 Dr. Zenia Resides had originally been referred to the patient for insomnia evaluation. Based on the hypoxemia and the mix of a mild sleep apnea , she could be invited the patient to come in for a CPAP titration split-night study. The patient is beginning cataracts and she has just been seen by optometry, she has been insomnic again. Testing was suspected to reveal also retinal disease and she was referred to Dr. Zadie Rhine a retinal specialist. She has a macular pucker. Surgery is scheduled for tomorrow morning 01-14-15. She is in need of a refill for Ambien which he uses to treat her chronic insomnia. In spite of this the last week had been difficult for her I think because of anxiety in anticipation of the surgery. Her fatigue severity score is up to 40 points and her fatigue her Epworth sleepiness score is normal at 2 points.  Her sleep is not restorative, still. She will have an overnight pulse-oximetry after her surgery is done. I will schedule this though [facility].  Rv with NP or me in May-June 2016.  06-04-15, [name] is here status post cataract ectomy bilaterally and surgery to the retina. As I had quoted in my last visit she underwent a sleep study on 3-20 5-15. She continued to complain of nonrestorative not refreshing sleep. She feels however that she has started to manage better with that and her fatigue is no longer as excessive. She endorsed the fatigue severity score of 31 points and the Epworth sleepiness score at 3 points. She's not likely to fall asleep unintended. She doesn't struggle with sleep attacks. In her geriatric depression score she endorsed only 1 point which is not indicative of suffering from clinical depression. She wears a fit bit, which records her pulse, she wore a ONO, and had 2 hours of low oxygen levels,  she will need a referral to pulmonology for further evaluation or will proceed with a CPAP auto-titration, to see if correcting the mild apnea is an option. She needs to lose weight.   04-25-2016 [name] was last seen by mypractitioner Cecille Rubin, and referred for a new CPAP titration on 01/27/2016. She was again diagnosed with insomnia and a low sleep efficiency of only 72.5% CPAP was explored from 5 through 8 cm water. And an air-fit P 10 nasal pillow in extra small size was used. She still reports that she feels restless and uncomfortable just was having anything in her face or attached to her body. And of course the PLM arousals were not addressed by CPAP. She would like to sleep on her side but feels compelled to sleep in supine position so that the mass  does not get as long.  The compliance data since May 30 show 14 out of 15 days of use she used to machine over 4 hours for only 11 out of 15 days. A 74% compliance. The average user time is 4 hours and 56 minutes. The set pressure was 8 cm water with 3 cm EPR and her residual AHI is 2.9 there is a significant reduction in her apnea index. However her baseline AHI was not that high in 2015 with an AHI of 9. I think given that the patient feels no improvement in her daytime alertness and actually more bothered by the machine and given that she has a mild apnea to begin with I would allow her to discontinue the use of CPAP she could change to a dental device is snoring bothers her, but she denies. She will concentrate on weight loss.    REVIEW OF SYSTEMS: Out of a complete 14 system review of symptoms, the patient complains only of the following symptoms, and all other reviewed systems are negative.  Back pain, insomnia, frequent waking  ALLERGIES: Allergies  Allergen Reactions  . Codeine Nausea And Vomiting    REACTION: vomiting    HOME MEDICATIONS: Outpatient Medications Prior to Visit  Medication Sig Dispense Refill  . celecoxib  (CELEBREX) 100 MG capsule TAKE 1 CAPSULE BY MOUTH TWICE A DAY 180 capsule 0  . ELIQUIS 5 MG TABS tablet TAKE 1 TABLET (5 MG TOTAL) BY MOUTH 2 (TWO) TIMES DAILY. 180 tablet 1  . losartan (COZAAR) 100 MG tablet Take 1 tablet (100 mg total) by mouth daily. 90 tablet 3  . metoprolol succinate (TOPROL-XL) 50 MG 24 hr tablet TAKE 1 TABLET BY MOUTH EVERY DAY 90 tablet 0  . propafenone (RYTHMOL) 300 MG tablet TAKE 1 TABLET BY MOUTH 2 TIMES DAILY. 180 tablet 3  . zolpidem (AMBIEN) 10 MG tablet TAKE HALF TO ONE TABLET BY MOUTH AT BEDTIME AS NEEDED 30 tablet 2   No facility-administered medications prior to visit.     PAST MEDICAL HISTORY: Past Medical History:  Diagnosis Date  . Acute upper respiratory infections of unspecified site   . Carotid bruit   . Cataracts, both eyes   . Chronic insomnia   . Colon polyps   . Diverticulosis   . Fatty liver   . Hiatal hernia   . Hx of adenomatous colonic polyps   . Hyperlipidemia   . Hypertension   . Insomnia, controlled 01/01/2014  . Internal hemorrhoids   . Macular pucker, left eye    surgery planned 01-14-15  . Menopause    noticed in early 67's  . Other acute sinusitis   . Other malaise and fatigue   . Paroxysmal atrial fibrillation (HCC)    on meds  . Pure hypercholesterolemia   . Restless leg syndrome   . Routine general medical examination at a health care facility   . Screening for lipoid disorders   . Sleep apnea    no c-pap  . Special screening for malignant neoplasms, colon     PAST SURGICAL HISTORY: Past Surgical History:  Procedure Laterality Date  . cardiolyte neg  6/06  . cardiovascular stress MRI  12/20/06   afib  . CHOLECYSTECTOMY    . KNEE ARTHROSCOPY     left  . ORIF- L distal radius  9/07   left wrist  . PARS PLANA VITRECTOMY W/ REPAIR OF MACULAR HOLE     left eye  . TUBAL LIGATION  FAMILY HISTORY: Family History  Problem Relation Age of Onset  . Breast cancer Mother   . Heart disease Father   . Colon  cancer Cousin 21  . Diabetes Unknown        cousins    SOCIAL HISTORY: Social History   Socioeconomic History  . Marital status: Married    Spouse name: Sterling Big  . Number of children: 3  . Years of education: College  . Highest education level: Not on file  Occupational History  . Occupation: Product manager: [employer]  Social Needs  . Financial resource strain: Not on file  . Food insecurity:    Worry: Not on file    Inability: Not on file  . Transportation needs:    Medical: Not on file    Non-medical: Not on file  Tobacco Use  . Smoking status: Never Smoker  . Smokeless tobacco: Never Used  Substance and Sexual Activity  . Alcohol use: Yes    Comment: rarely  . Drug use: No  . Sexual activity: Never  Lifestyle  . Physical activity:    Days per week: Not on file    Minutes per session: Not on file  . Stress: Not on file  Relationships  . Social connections:    Talks on phone: Not on file    Gets together: Not on file    Attends religious service: Not on file    Active member of club or organization: Not on file    Attends meetings of clubs or organizations: Not on file    Relationship status: Not on file  . Intimate partner violence:    Fear of current or ex partner: Not on file    Emotionally abused: Not on file    Physically abused: Not on file    Forced sexual activity: Not on file  Other Topics Concern  . Not on file  Social History Narrative   Patient is married Sterling Big) and lives at home with her husband.   Patient has three adult children.   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    Patient is right-handed.   Patient drinks two cups of caffeine daily.   Patient has a college education.      PHYSICAL EXAM  Vitals:   07/18/18 1042  BP: (!) 153/73  Pulse: (!) 52  Weight: 195 lb (88.5 kg)  Height: 5\' 3"  (1.6 m)   Body mass index is 34.54 kg/m.  Physical exam:  General: The patient  is awake, alert and appears not in acute distress. The patient is well groomed. She uses often a 5 mg tab -  Head: Normocephalic, atraumatic. Neck is supple. Mallampati 3 , neck circumference:15 , Cardiovascular:  irregular rate and rhythm , without  murmurs or carotid bruit, and without distended neck veins. Respiratory: Lungs are clear to auscultation. Skin:  Without evidence of edema, or rash Trunk: BMI is elevated.  Neurologic exam : The patient is awake and alert, oriented to place and time.  Memory subjective described as intact. There is a normal attention span & concentration ability. Speech is fluent without  dysarthria, dysphonia or aphasia. Mood and affect are appropriate.  Cranial nerves:  taste and smel are intact. Pupils are equal and briskly reactive to light, she has left over right ptosis- the eyelid straddles the pupil. Retinal surgery scars noted.   Full EOM. Hearing to finger rub intact.  Facial sensation intact  to fine touch. Facial motor strength is symmetric and tongue and uvula move midline. Tongue protrusion intact.  Assessment:  After physical and neurologic examination, review of laboratory studies, imaging, neurophysiology testing and pre-existing records, assessment is   1) likely Insomnia with mild OSA - patient reported reluctantly about her snoring and waking up with a dry mouth.  No morning headaches , no nocturia. She is not wanting CPAP. Will d/c   2) BMI is still high- she may benefit from medical weight management. 3) she continues to be chronically anticoagulated for atrial fibrillation.  .    Plan:  Treatment plan and additional workup : 1)  will allow with medical consent to d/c the CPAP use.  2) Refilled Ambien prn.  3) Headaches- tension /  describes no photophobia and no nausea. Marland Kitchen   DIAGNOSTIC DATA (LABS, IMAGING, TESTING) - I reviewed patient records, labs, notes, testing and imaging myself where available.  Lab Results  Component Value Date     WBC 7.3 04/19/2017   HGB 13.2 04/19/2017   HCT 38.1 04/19/2017   MCV 88 04/19/2017   PLT 192 04/19/2017    Lab Results  Component Value Date   CHOL 198 09/24/2015   HDL 52 09/24/2015   LDLCALC 121 09/24/2015   LDLDIRECT 137.0 08/10/2010   TRIG 127 09/24/2015   CHOLHDL 3.8 09/24/2015    Lab Results  Component Value Date   TSH 2.086 08/28/2014      ASSESSMENT AND PLAN:  1. Chronic insomnia , mild and well controlled on Ambien 5-10 mg at bedtime  2. Anxiety is improved , but stress of caretaker burden.  3. Atrial fib , rate controlled. 4. Obesity- referral to medical weight management.     The patient will continue on Ambien 5-10 mg at bedtime. I have again encouraged the patient to make an appointment with Dr. Casimiro Needle. The patient does acknowledge that she has a lot of anxiety.  This could be interfering with her sleep as well. Patient voices understanding.  She will follow-up in 5 months with NP- YES, NP!!   Larey Seat, MD  07/18/2018, 11:14 AM [facility] [address], [place], [place] [address] [phone]

## 2018-07-18 NOTE — Patient Instructions (Signed)
Please remember to try to maintain good sleep hygiene, which means: Keep a regular sleep and wake schedule, try not to exercise or have a meal within 2 hours of your bedtime, try to keep your bedroom conducive for sleep, that is, cool and dark, without light distractors such as an illuminated alarm clock, and refrain from watching TV right before sleep or in the middle of the night and do not keep the TV or radio on during the night. Also, try not to use or play on electronic devices at bedtime, such as your cell phone, tablet PC or laptop. If you like to read at bedtime on an electronic device, try to dim the background light as much as possible. Do not eat in the middle of the night.   For chronic insomnia, you are best followed by a psychiatrist and/or sleep psychologist.  Your PCP may provide sleep aids, we will only provide sleep aids if you also follow a psychologist.

## 2018-08-01 ENCOUNTER — Other Ambulatory Visit: Payer: Self-pay | Admitting: Neurology

## 2018-08-24 ENCOUNTER — Other Ambulatory Visit: Payer: Self-pay | Admitting: Cardiology

## 2018-08-24 DIAGNOSIS — I480 Paroxysmal atrial fibrillation: Secondary | ICD-10-CM

## 2018-09-03 ENCOUNTER — Other Ambulatory Visit: Payer: Self-pay | Admitting: Cardiology

## 2018-09-03 DIAGNOSIS — I480 Paroxysmal atrial fibrillation: Secondary | ICD-10-CM

## 2018-09-06 ENCOUNTER — Ambulatory Visit (INDEPENDENT_AMBULATORY_CARE_PROVIDER_SITE_OTHER): Payer: Medicare Other

## 2018-09-06 DIAGNOSIS — Z23 Encounter for immunization: Secondary | ICD-10-CM

## 2018-09-09 NOTE — Telephone Encounter (Signed)
This should be filled by primary care  Peter Martinique MD, [facility]

## 2018-09-21 ENCOUNTER — Other Ambulatory Visit: Payer: Self-pay | Admitting: Cardiology

## 2018-09-27 ENCOUNTER — Other Ambulatory Visit: Payer: Self-pay

## 2018-10-23 ENCOUNTER — Other Ambulatory Visit: Payer: Self-pay | Admitting: Cardiology

## 2018-10-27 ENCOUNTER — Other Ambulatory Visit: Payer: Self-pay | Admitting: Neurology

## 2018-10-29 ENCOUNTER — Other Ambulatory Visit: Payer: Self-pay | Admitting: Neurology

## 2018-10-29 MED ORDER — ZOLPIDEM TARTRATE 10 MG PO TABS: ORAL_TABLET | ORAL | 0 refills | Status: DC

## 2018-11-22 ENCOUNTER — Other Ambulatory Visit: Payer: Self-pay | Admitting: Cardiology

## 2018-11-23 NOTE — Telephone Encounter (Signed)
Called pt to let them know current labs are needed for this refill

## 2018-12-23 NOTE — Progress Notes (Signed)
History of Present Illness: [name] is seen today to followup of atrial fibrillation and dyspnea. She has a history of paroxysmal atrial fibrillation managed with Rhythmol and metoprolol. She is on Eliquis for anticoagulation.  In October 2015 she complained of increased palpitations and her Rhythmol dose was increased with good response. She complained of increased dyspnea and had a normal stress Myoview. Echo showed normal LV function with mild MR and mild Pulmonary HTN.   On follow up today she notes that her husband passed in December after a 2 year battle with leukemia. She hasn't been monitoring her BP. Notes a couple of episodes of Afib recently. One at night that lasted several hours and another episode that lasted most of the day. She has lost 8 lbs. Still grieving. She does note some dyspnea going up stairs.   Past Medical History:  Diagnosis Date  . Acute upper respiratory infections of unspecified site   . Carotid bruit   . Cataracts, both eyes   . Chronic insomnia   . Colon polyps   . Diverticulosis   . Fatty liver   . Hiatal hernia   . Hx of adenomatous colonic polyps   . Hyperlipidemia   . Hypertension   . Insomnia, controlled 01/01/2014  . Internal hemorrhoids   . Macular pucker, left eye    surgery planned 01-14-15  . Menopause    noticed in early 29's  . Other acute sinusitis   . Other malaise and fatigue   . Paroxysmal atrial fibrillation (HCC)    on meds  . Pure hypercholesterolemia   . Restless leg syndrome   . Routine general medical examination at a health care facility   . Screening for lipoid disorders   . Sleep apnea    no c-pap  . Special screening for malignant neoplasms, colon     Current Outpatient Medications  Medication Sig Dispense Refill  . celecoxib (CELEBREX) 100 MG capsule TAKE 1 CAPSULE BY MOUTH TWICE A DAY 180 capsule 0  . ELIQUIS 5 MG TABS tablet TAKE 1 TABLET BY MOUTH TWICE A DAY 60 tablet 0  . losartan (COZAAR) 100 MG tablet  Take 1 tablet (100 mg total) by mouth daily. Contact our office for an appointment 90 tablet 0  . metoprolol succinate (TOPROL-XL) 50 MG 24 hr tablet TAKE 1 TABLET BY MOUTH EVERY DAY 90 tablet 0  . propafenone (RYTHMOL) 300 MG tablet TAKE 1 TABLET BY MOUTH 2 TIMES DAILY. 180 tablet 1  . zolpidem (AMBIEN) 10 MG tablet TAKE HALF TABLET BY MOUTH AT BEDTIME AS NEEDED 45 tablet 0   No current facility-administered medications for this visit.     Allergies: Allergies  Allergen Reactions  . Codeine Nausea And Vomiting    REACTION: vomiting   Review of systems: As noted in history of present illness. All other review of systems are reviewed and are negative.  Vital Signs: BP (!) 164/80   Pulse (!) 56   Ht 5\' 3"  (1.6 m)   Wt 187 lb 12.8 oz (85.2 kg)   SpO2 95%   BMI 33.27 kg/m   PHYSICAL EXAM: GENERAL:  Well appearing WF in NAD HEENT:  PERRL, EOMI, sclera are clear. Oropharynx is clear. NECK:  No jugular venous distention, carotid upstroke brisk and symmetric, no bruits, no thyromegaly or adenopathy LUNGS:  Clear to auscultation bilaterally CHEST:  Unremarkable HEART:  RRR,  PMI not displaced or sustained,S1 and S2 within normal limits, no S3, no S4: no clicks,  no rubs, no murmurs ABD:  Soft, nontender. BS +, no masses or bruits. No hepatomegaly, no splenomegaly EXT:  2 + pulses throughout, no edema, no cyanosis no clubbing SKIN:  Warm and dry.  No rashes NEURO:  Alert and oriented x 3. Cranial nerves II through XII intact. PSYCH:  Cognitively intact      Laboratory data:  Lab Results  Component Value Date   WBC 7.3 04/19/2017   HGB 13.2 04/19/2017   HCT 38.1 04/19/2017   PLT 192 04/19/2017   GLUCOSE 113 (H) 04/18/2017   CHOL 198 09/24/2015   TRIG 127 09/24/2015   HDL 52 09/24/2015   LDLDIRECT 137.0 08/10/2010   LDLCALC 121 09/24/2015   ALT 22 09/24/2015   AST 17 09/24/2015   NA 142 04/18/2017   K 4.2 04/18/2017   CL 106 04/18/2017   CREATININE 1.04 (H) 04/18/2017    BUN 24 04/18/2017   CO2 23 04/18/2017   TSH 2.086 08/28/2014   INR 3.2 09/28/2015   Ecg today shows sinus brady rate 55. Otherwise normal. I have personally reviewed and interpreted this study.   ASSESSMENT AND PLAN:  1. Paroxysmal atrial fibrillation. This is generally well controlled on  Rythmol. She is on chronic anticoagulation with Eliquis. On Toprol. Ecg looks OK today.  Continue current therapy  2. Hypertension. Blood pressure is high today. Will follow up lab work. I asked that she monitor her BP and let us know if it remains high. If so would add amlodipine or chlorthalidone.   Follow up in 6 months.

## 2018-12-25 ENCOUNTER — Ambulatory Visit (INDEPENDENT_AMBULATORY_CARE_PROVIDER_SITE_OTHER): Payer: Medicare Other | Admitting: Cardiology

## 2018-12-25 ENCOUNTER — Other Ambulatory Visit: Payer: Self-pay

## 2018-12-25 ENCOUNTER — Encounter: Payer: Self-pay | Admitting: Cardiology

## 2018-12-25 VITALS — BP 164/80 | HR 56 | Ht 63.0 in | Wt 187.8 lb

## 2018-12-25 DIAGNOSIS — I480 Paroxysmal atrial fibrillation: Secondary | ICD-10-CM | POA: Diagnosis not present

## 2018-12-25 DIAGNOSIS — I10 Essential (primary) hypertension: Secondary | ICD-10-CM | POA: Diagnosis not present

## 2018-12-25 NOTE — Patient Instructions (Signed)
We will check blood work today.  Keep an eye on your blood pressure and let me know if it is staying high.

## 2018-12-26 LAB — CBC WITH DIFFERENTIAL/PLATELET
BASOS: 1 %
Basophils Absolute: 0 10*3/uL (ref 0.0–0.2)
EOS (ABSOLUTE): 0.1 10*3/uL (ref 0.0–0.4)
Eos: 1 %
Hematocrit: 41.3 % (ref 34.0–46.6)
Hemoglobin: 14 g/dL (ref 11.1–15.9)
Immature Grans (Abs): 0 10*3/uL (ref 0.0–0.1)
Immature Granulocytes: 0 %
LYMPHS ABS: 2.7 10*3/uL (ref 0.7–3.1)
LYMPHS: 35 %
MCH: 29.8 pg (ref 26.6–33.0)
MCHC: 33.9 g/dL (ref 31.5–35.7)
MCV: 88 fL (ref 79–97)
MONOCYTES: 8 %
MONOS ABS: 0.6 10*3/uL (ref 0.1–0.9)
NEUTROS ABS: 4.4 10*3/uL (ref 1.4–7.0)
NEUTROS PCT: 55 %
PLATELETS: 221 10*3/uL (ref 150–450)
RBC: 4.7 x10E6/uL (ref 3.77–5.28)
RDW: 12.8 % (ref 11.7–15.4)
WBC: 7.8 10*3/uL (ref 3.4–10.8)

## 2018-12-26 LAB — COMPREHENSIVE METABOLIC PANEL
A/G RATIO: 1.6 (ref 1.2–2.2)
ALK PHOS: 94 IU/L (ref 39–117)
ALT: 18 IU/L (ref 0–32)
AST: 15 IU/L (ref 0–40)
Albumin: 4 g/dL (ref 3.7–4.7)
BILIRUBIN TOTAL: 0.4 mg/dL (ref 0.0–1.2)
BUN/Creatinine Ratio: 17 (ref 12–28)
BUN: 17 mg/dL (ref 8–27)
CHLORIDE: 107 mmol/L — AB (ref 96–106)
CO2: 22 mmol/L (ref 20–29)
Calcium: 8.9 mg/dL (ref 8.7–10.3)
Creatinine, Ser: 1.02 mg/dL — ABNORMAL HIGH (ref 0.57–1.00)
GFR calc non Af Amer: 54 mL/min/{1.73_m2} — ABNORMAL LOW (ref >59)
GFR, EST AFRICAN AMERICAN: 63 mL/min/{1.73_m2} (ref >59)
GLUCOSE: 83 mg/dL (ref 65–99)
Globulin, Total: 2.5 g/dL (ref 1.5–4.5)
POTASSIUM: 4.8 mmol/L (ref 3.5–5.2)
Sodium: 144 mmol/L (ref 134–144)
TOTAL PROTEIN: 6.5 g/dL (ref 6.0–8.5)

## 2018-12-26 LAB — LIPID PANEL
Chol/HDL Ratio: 3.1 ratio (ref 0.0–4.4)
Cholesterol, Total: 187 mg/dL (ref 100–199)
HDL: 61 mg/dL (ref >39)
LDL Calculated: 105 mg/dL — ABNORMAL HIGH (ref 0–99)
Triglycerides: 107 mg/dL (ref 0–149)
VLDL Cholesterol Cal: 21 mg/dL (ref 5–40)

## 2018-12-26 LAB — TSH: TSH: 1.19 u[IU]/mL (ref 0.450–4.500)

## 2019-01-07 ENCOUNTER — Other Ambulatory Visit: Payer: Self-pay | Admitting: Cardiology

## 2019-01-07 DIAGNOSIS — I480 Paroxysmal atrial fibrillation: Secondary | ICD-10-CM

## 2019-01-29 ENCOUNTER — Other Ambulatory Visit: Payer: Self-pay | Admitting: Neurology

## 2019-02-04 ENCOUNTER — Other Ambulatory Visit: Payer: Self-pay

## 2019-02-04 MED ORDER — APIXABAN 5 MG PO TABS: 5.0000 mg | ORAL_TABLET | Freq: Two times a day (BID) | ORAL | 2 refills | Status: DC

## 2019-03-03 ENCOUNTER — Other Ambulatory Visit: Payer: Self-pay | Admitting: Cardiology

## 2019-03-04 ENCOUNTER — Other Ambulatory Visit: Payer: Self-pay

## 2019-03-04 MED ORDER — PROPAFENONE HCL 300 MG PO TABS: 300.0000 mg | ORAL_TABLET | Freq: Two times a day (BID) | ORAL | 1 refills | Status: DC

## 2019-03-04 NOTE — Telephone Encounter (Signed)
Losartan 100 mg refilled. 

## 2019-03-15 ENCOUNTER — Telehealth: Payer: Self-pay | Admitting: Family Medicine

## 2019-03-15 NOTE — Telephone Encounter (Signed)
Called to see if pt still seeing Dr Deborra Medina since it was early 2018 when pt had last appt, left message

## 2019-04-04 ENCOUNTER — Other Ambulatory Visit: Payer: Self-pay | Admitting: Cardiology

## 2019-04-04 DIAGNOSIS — I480 Paroxysmal atrial fibrillation: Secondary | ICD-10-CM

## 2019-04-23 ENCOUNTER — Ambulatory Visit (INDEPENDENT_AMBULATORY_CARE_PROVIDER_SITE_OTHER): Payer: Medicare Other

## 2019-04-23 ENCOUNTER — Ambulatory Visit (INDEPENDENT_AMBULATORY_CARE_PROVIDER_SITE_OTHER): Payer: Medicare Other | Admitting: Physician Assistant

## 2019-04-23 ENCOUNTER — Encounter: Payer: Self-pay | Admitting: Physician Assistant

## 2019-04-23 ENCOUNTER — Ambulatory Visit: Payer: Self-pay

## 2019-04-23 ENCOUNTER — Other Ambulatory Visit: Payer: Self-pay

## 2019-04-23 DIAGNOSIS — M25562 Pain in left knee: Secondary | ICD-10-CM

## 2019-04-23 DIAGNOSIS — M25561 Pain in right knee: Secondary | ICD-10-CM

## 2019-04-23 DIAGNOSIS — M1712 Unilateral primary osteoarthritis, left knee: Secondary | ICD-10-CM

## 2019-04-23 DIAGNOSIS — G8929 Other chronic pain: Secondary | ICD-10-CM

## 2019-04-23 DIAGNOSIS — M1711 Unilateral primary osteoarthritis, right knee: Secondary | ICD-10-CM | POA: Diagnosis not present

## 2019-04-23 NOTE — Progress Notes (Signed)
Office Visit Note   Patient: [name]           Date of Birth: 10/31/1946           MRN: [id number] Visit Date: 04/23/2019              Requested by: Lucille Passy, MD [place],  [place] [id number] PCP: Lucille Passy, MD   Assessment & Plan: Visit Diagnoses:  1. Chronic pain of both knees   2. Unilateral primary osteoarthritis, left knee   3. Unilateral primary osteoarthritis, right knee     Plan: Due to the fact the patient is having severe pain in both knees he has been getting worse despite time, NSAIDs and assistive devices i.e. a walker recommend left total knee arthroplasty.  However patient would need to come off of her Eliquis and need to have cardiac clearance.  She would like proceed with a left total knee arthroplasty in the near future.  Discussed with her risk of surgery with her including but not limited to infection DVT/PE, wound healing problems, prolonged pain worsening pain.  Discussed postop protocol with her at length.  She is given Samella Parr card she will call Judeen Hammans when she is ready to schedule surgery when she has clearance from her cardiologist.  She will follow-up 2 weeks postop.  Follow-Up Instructions: Return for post op 2weeks.   Orders:  Orders Placed This Encounter  Procedures  . XR Knee 1-2 Views Left  . XR Knee 1-2 Views Right   No orders of the defined types were placed in this encounter.     Procedures: No procedures performed   Clinical Data: No additional findings.   Subjective: Chief Complaint  Patient presents with  . Left Knee - Pain  . Right Knee - Pain    HPI [name] is a 73 year old female whose well-known to Dr. Ninfa Linden service but has not been here for years.  She has had knee arthroscopies both knees in the past by Dr. Ninfa Linden.  Right knee arthroscopy 02/01/2007 knee arthroscopy with partial medial meniscectomy and chondroplasty of grade 3 medial femoral condyle chondromalacia and  chondroplasty of grade 4 patellofemoral chondromalacia.  Left knee arthroscopy 620 of 2013 with partial medial meniscectomy chondroplasty of grade 3 medial femoral condyle and grade 2 patellofemoral chondromalacia.  She has had no new injury to either knee.  She is nondiabetic.  She does have atrial fibrillation and is on chronic Eliquis.  She also has sleep apnea but does not use a CPAP.   Review of Systems Denies any fevers or chills please see HPI otherwise negative.  Objective: Vital Signs: There were no vitals taken for this visit.  Physical Exam Constitutional:      Appearance: She is not ill-appearing or diaphoretic.  Pulmonary:     Effort: Pulmonary effort is normal.  Neurological:     Mental Status: She is alert and oriented to person, place, and time.  Psychiatric:        Mood and Affect: Mood normal.        Behavior: Behavior normal.     Ortho Exam Bilateral knees significant patellofemoral crepitus with passive range of motion both knees.  Good range of motion both knees tenderness along medial joint line site varus deformities of both knees.  No instability valgus varus stressing of either knee no abnormal warmth erythema or ecchymosis about either knee.  Specialty Comments:  No specialty comments available.  Imaging: Xr  Knee 1-2 Views Left  Result Date: 04/23/2019 Left knee AP and lateral views: No acute fractures.  Bone-on-bone medial compartment.  Patellofemoral irregularities consistent with moderate arthritic changes.  No bony abnormalities otherwise.  Xr Knee 1-2 Views Right  Result Date: 04/23/2019 Right knee AP and lateral views: No acute fracture.  Near bone-on-bone medial compartment.  Moderate patellofemoral changes.  Slight varus deformity.  No bony abnormalities otherwise.    PMFS History: Patient Active Problem List   Diagnosis Date Noted  . Unilateral primary osteoarthritis, right knee 04/23/2019  . Unilateral primary osteoarthritis, left knee  04/23/2019  . Obesity due to excess calories with serious comorbidity 07/18/2018  . Chronic insomnia 07/18/2018  . Anxiety associated with depression 07/18/2018  . Morbid obesity due to excess calories ([place]) 04/25/2016  . OSA (obstructive sleep apnea) 04/25/2016  . Paroxysmal atrial fibrillation ([place]) 04/25/2016  . Obstructive sleep apnea 06/04/2015  . Insomnia w/ sleep apnea 06/04/2015  . Hypoxemia 06/04/2015  . Encounter for therapeutic drug monitoring 02/10/2015  . Insomnia, controlled 01/01/2014  . Skin lesion of breast 06/05/2013  . Urinary incontinence, mixed 06/05/2013  . Chronic anticoagulation 09/12/2012  . Atrial fibrillation ([place]) 01/10/2011  . Carotid stenosis 01/10/2011  . DYSPNEA 01/10/2011  . COLONIC POLYPS, ADENOMATOUS, HX OF 11/04/2010  . HEMORRHOIDS-INTERNAL 11/03/2010  . HIATAL HERNIA 11/03/2010  . DIVERTICULAR DISEASE 11/03/2010  . RECTAL BLEEDING 11/03/2010  . FATTY LIVER DISEASE, HX OF 11/03/2010  . HYPERCHOLESTEROLEMIA 08/10/2010  . Other fatigue 12/22/2009  . CHOLELITHIASIS 08/06/2008  . Essential hypertension 07/25/2008  . INSOMNIA, CHRONIC 05/21/2007  . RESTLESS LEG SYNDROME 05/21/2007  . SLEEP APNEA 05/21/2007   Past Medical History:  Diagnosis Date  . Acute upper respiratory infections of unspecified site   . Carotid bruit   . Cataracts, both eyes   . Chronic insomnia   . Colon polyps   . Diverticulosis   . Fatty liver   . Hiatal hernia   . Hx of adenomatous colonic polyps   . Hyperlipidemia   . Hypertension   . Insomnia, controlled 01/01/2014  . Internal hemorrhoids   . Macular pucker, left eye    surgery planned 01-14-15  . Menopause    noticed in early 89's  . Other acute sinusitis   . Other malaise and fatigue   . Paroxysmal atrial fibrillation (HCC)    on meds  . Pure hypercholesterolemia   . Restless leg syndrome   . Routine general medical examination at a health care facility   . Screening for lipoid disorders   . Sleep apnea     no c-pap  . Special screening for malignant neoplasms, colon     Family History  Problem Relation Age of Onset  . Breast cancer Mother   . Heart disease Father   . Colon cancer Cousin 73  . Diabetes Unknown        cousins    Past Surgical History:  Procedure Laterality Date  . cardiolyte neg  6/06  . cardiovascular stress MRI  12/20/06   afib  . CHOLECYSTECTOMY    . KNEE ARTHROSCOPY     left  . ORIF- L distal radius  9/07   left wrist  . PARS PLANA VITRECTOMY W/ REPAIR OF MACULAR HOLE     left eye  . TUBAL LIGATION     Social History   Occupational History  . Occupation: Product manager: [employer]  Tobacco Use  . Smoking status: Never Smoker  .  Smokeless tobacco: Never Used  Substance and Sexual Activity  . Alcohol use: Yes    Comment: rarely  . Drug use: No  . Sexual activity: Never

## 2019-04-26 ENCOUNTER — Encounter: Payer: Self-pay | Admitting: Gastroenterology

## 2019-04-26 ENCOUNTER — Telehealth: Payer: Self-pay

## 2019-04-26 NOTE — Telephone Encounter (Signed)
   [facility] Pre-operative Risk Assessment    Request for surgical clearance:  1. What type of surgery is being performed? left total knee arthroplasty   2. When is this surgery scheduled? pending   3. What type of clearance is required (medical clearance vs. Pharmacy clearance to hold med vs. Both)? both  4. Are there any medications that need to be held prior to surgery and how long? Eliquis 5 mg   5. Practice name and name of physician performing surgery? [facility]  Dr Zollie Beckers   6. What is your office phone number? [phone]    7.   What is your office fax number? [place]  8.   Anesthesia type (None, local, MAC, general)? choice (general vs spinal)   [name], [name] 04/26/2019, 3:55 PM  _________________________________________________________________   (provider comments below)

## 2019-04-29 ENCOUNTER — Other Ambulatory Visit: Payer: Self-pay | Admitting: Orthopaedic Surgery

## 2019-04-29 MED ORDER — TRAMADOL HCL 50 MG PO TABS: 50.0000 mg | ORAL_TABLET | Freq: Four times a day (QID) | ORAL | 0 refills | Status: DC | PRN

## 2019-04-29 NOTE — Telephone Encounter (Signed)
   Primary Cardiologist: No primary care provider on file.  Chart reviewed as part of pre-operative protocol coverage. Patient was contacted 04/29/2019 in reference to pre-operative risk assessment for pending surgery as outlined below.  [name] was last seen on 12/25/2018 by Dr. Martinique.  Since that day, [name] has done well. She has hx of PAF controlled with Rythmol. She has had no recent recurrences. She has no history of documented CAD or stroke. No diabetes or renal disease.  Prior to her knee pain she was doing all of her housework and yardwork until a couple of weeks ago. She was having no chest discomfort or shortness of breath. She is now using a walker due to knee pain.   According to the RCRI she is low risk for major cardiac event perioperatively.  Therefore, based on ACC/AHA guidelines, the patient would be at acceptable risk for the planned procedure without further cardiovascular testing.   According to our pharmacy protocol:   Patient with diagnosis of afib on Eliquis for anticoagulation.    Procedure: left total knee arthroplasty Date of procedure: TBD  CHADS2-VASc score of  3 (CHF, HTN, AGE, DM2, stroke/tia x 2, CAD, AGE, female)  CrCl 36ml/min  Per office protocol, patient can hold Eliquis for 3 days prior to procedure.         I will route this recommendation to the requesting party via Epic fax function and remove from pre-op pool.  Please call with questions.  Daune Perch, NP 04/29/2019, 4:10 PM

## 2019-04-29 NOTE — Telephone Encounter (Signed)
Patient with diagnosis of afib on Eliquis for anticoagulation.    Procedure:  left total knee arthroplasty  Date of procedure: TBD  CHADS2-VASc score of  3 (CHF, HTN, AGE, DM2, stroke/tia x 2, CAD, AGE, female)  CrCl 61ml/min  Per office protocol, patient can hold Eliquis for 3 days prior to procedure.

## 2019-04-30 ENCOUNTER — Other Ambulatory Visit: Payer: Self-pay | Admitting: Neurology

## 2019-05-07 ENCOUNTER — Other Ambulatory Visit: Payer: Self-pay | Admitting: Cardiology

## 2019-05-20 ENCOUNTER — Other Ambulatory Visit: Payer: Self-pay | Admitting: Physician Assistant

## 2019-05-28 ENCOUNTER — Encounter (HOSPITAL_COMMUNITY): Payer: Self-pay

## 2019-05-28 ENCOUNTER — Other Ambulatory Visit (HOSPITAL_COMMUNITY)
Admission: RE | Admit: 2019-05-28 | Discharge: 2019-05-28 | Disposition: A | Discharge status: Home / Self Care | Payer: Medicare Other | Source: Ambulatory Visit | Attending: Orthopaedic Surgery | Admitting: Orthopaedic Surgery

## 2019-05-28 DIAGNOSIS — Z1159 Encounter for screening for other viral diseases: Secondary | ICD-10-CM | POA: Insufficient documentation

## 2019-05-28 LAB — SARS CORONAVIRUS 2 (TAT 6-24 HRS): SARS Coronavirus 2: NEGATIVE

## 2019-05-28 NOTE — Progress Notes (Signed)
The following are in epic: Cardiac clearance J. Karma Ganja. 04/29/2019 Last office visit note Dr. Johnsie Cancel 12/25/2018 EKG 12/25/2018

## 2019-05-28 NOTE — Patient Instructions (Addendum)
DUE TO COVID-19 ONLY ONE VISITOR IS ALLOWED IN THE HOSPITAL AT THIS TIME   COVID SWAB TESTING COMPLETED ON: May 28, 2019  (Must self quarantine after testing. Follow instructions on handout.)   Your procedure is scheduled on: Tomorrow, May 31, 2019   Surgery Time:  8:30AM-9:30AM   Report to [facility] Main  Entrance    Report to admitting at 6:00 AM   Call this number if you have problems the morning of surgery [phone]   Do not eat food:After Midnight.   May have liquids until 5:30AM day of surgery   CLEAR LIQUID DIET  Foods Allowed                                                                     Foods Excluded  Water, Black Coffee and tea, regular and decaf                             liquids that you cannot  Plain Jell-O in any flavor                                             see through such as: Fruit ices (not with fruit pulp)                                     milk, soups, orange juice  Iced Popsicles                                    All solid food Carbonated beverages, regular and diet                                    Cranberry, grape and apple juices Sports drinks like Gatorade Lightly seasoned clear broth or consume(fat free) Sugar, honey syrup  Sample Menu Breakfast                                Lunch                                     Supper Cranberry juice                    Beef broth                            Chicken broth Jell-O                                     Grape juice  Apple juice Coffee or tea                        Jell-O                                      Popsicle                                                Coffee or tea                        Coffee or tea   Complete one Ensure drink the morning of surgery at 5:30AM the day of surgery.   Brush your teeth the morning of surgery.   Do NOT smoke after Midnight   Take these medicines the morning of surgery with A SIP OF WATER: Metoprolol,  Propafenone                               You may not have any metal on your body including hair pins, jewelry, and body piercings             Do not wear make-up, lotions, powders, perfumes/cologne, or deodorant             Do not wear nail polish.  Do not shave  48 hours prior to surgery.                 Do not bring valuables to the hospital. [place].   Contacts, dentures or bridgework may not be worn into surgery.   Bring small overnight bag day of surgery.   Special Instructions: Bring a copy of your healthcare power of attorney and living will documents         the day of surgery if you haven't scanned them in before.              Please read over the following fact sheets you were given:  [facility] - Preparing for Surgery Before surgery, you can play an important role.  Because skin is not sterile, your skin needs to be as free of germs as possible.  You can reduce the number of germs on your skin by washing with CHG (chlorahexidine gluconate) soap before surgery.  CHG is an antiseptic cleaner which kills germs and bonds with the skin to continue killing germs even after washing. Please DO NOT use if you have an allergy to CHG or antibacterial soaps.  If your skin becomes reddened/irritated stop using the CHG and inform your nurse when you arrive at Short Stay. Do not shave (including legs and underarms) for at least 48 hours prior to the first CHG shower.  You may shave your face/neck.  Please follow these instructions carefully:  1.  Shower with CHG Soap the night before surgery and the  morning of surgery.  2.  If you choose to wash your hair, wash your hair first as usual with your normal  shampoo.  3.  After you shampoo, rinse your hair and body thoroughly to remove the  shampoo.                             4.  Use CHG as you would any other liquid soap.  You can apply chg directly to the skin and wash.  Gently with a scrungie  or clean washcloth.  5.  Apply the CHG Soap to your body ONLY FROM THE NECK DOWN.   Do  not use on face/ open                           Wound or open sores. Avoid contact with eyes, ears mouth and genitals (private parts).                       Wash face,  Genitals (private parts) with your normal soap.             6.  Wash thoroughly, paying special attention to the area where your surgery  will be performed.  7.  Thoroughly rinse your body with warm water from the neck down.  8.  DO NOT shower/wash with your normal soap after using and rinsing off the CHG Soap.                9.  Pat yourself dry with a clean towel.            10.  Wear clean pajamas.            11.  Place clean sheets on your bed the night of your first shower and do not  sleep with pets. Day of Surgery : Do not apply any lotions/deodorants the morning of surgery.  Please wear clean clothes to the hospital/surgery center.  FAILURE TO FOLLOW THESE INSTRUCTIONS MAY RESULT IN THE CANCELLATION OF YOUR SURGERY  PATIENT SIGNATURE_________________________________  NURSE SIGNATURE__________________________________  ________________________________________________________________________   [name]  An incentive spirometer is a tool that can help keep your lungs clear and active. This tool measures how well you are filling your lungs with each breath. Taking long deep breaths may help reverse or decrease the chance of developing breathing (pulmonary) problems (especially infection) following:  A long period of time when you are unable to move or be active. BEFORE THE PROCEDURE   If the spirometer includes an indicator to show your best effort, your nurse or respiratory therapist will set it to a desired goal.  If possible, sit up straight or lean slightly forward. Try not to slouch.  Hold the incentive spirometer in an upright position. INSTRUCTIONS FOR USE  1. Sit on the edge of your bed if possible, or sit  up as far as you can in bed or on a chair. 2. Hold the incentive spirometer in an upright position. 3. Breathe out normally. 4. Place the mouthpiece in your mouth and seal your lips tightly around it. 5. Breathe in slowly and as deeply as possible, raising the piston or the ball toward the top of the column. 6. Hold your breath for 3-5 seconds or for as long as possible. Allow the piston or ball to fall to the bottom of the column. 7. Remove the mouthpiece from your mouth and breathe out normally. 8. Rest for a few seconds and repeat Steps 1 through 7 at least 10 times every 1-2 hours when you are awake. Take your time and take a few normal breaths between deep breaths.  9. The spirometer may include an indicator to show your best effort. Use the indicator as a goal to work toward during each repetition. 10. After each set of 10 deep breaths, practice coughing to be sure your lungs are clear. If you have an incision (the cut made at the time of surgery), support your incision when coughing by placing a pillow or rolled up towels firmly against it. Once you are able to get out of bed, walk around indoors and cough well. You may stop using the incentive spirometer when instructed by your caregiver.  RISKS AND COMPLICATIONS  Take your time so you do not get dizzy or light-headed.  If you are in pain, you may need to take or ask for pain medication before doing incentive spirometry. It is harder to take a deep breath if you are having pain. AFTER USE  Rest and breathe slowly and easily.  It can be helpful to keep track of a log of your progress. Your caregiver can provide you with a simple table to help with this. If you are using the spirometer at home, follow these instructions: [place] IF:   You are having difficultly using the spirometer.  You have trouble using the spirometer as often as instructed.  Your pain medication is not giving enough relief while using the  spirometer.  You develop fever of 100.5 F (38.1 C) or higher. SEEK IMMEDIATE MEDICAL CARE IF:   You cough up bloody sputum that had not been present before.  You develop fever of 102 F (38.9 C) or greater.  You develop worsening pain at or near the incision site. MAKE SURE YOU:   Understand these instructions.  Will watch your condition.  Will get help right away if you are not doing well or get worse. Document Released: 03/06/2007 Document Revised: 01/16/2012 Document Reviewed: 05/07/2007 [facility] Patient Information 2014 [place], [place].   ________________________________________________________________________

## 2019-05-29 ENCOUNTER — Encounter (HOSPITAL_COMMUNITY)
Admission: RE | Admit: 2019-05-29 | Discharge: 2019-05-29 | Disposition: A | Discharge status: Home / Self Care | Payer: Medicare Other | Source: Ambulatory Visit | Attending: Orthopaedic Surgery | Admitting: Orthopaedic Surgery

## 2019-05-29 ENCOUNTER — Other Ambulatory Visit: Payer: Self-pay

## 2019-05-29 ENCOUNTER — Encounter (HOSPITAL_COMMUNITY): Payer: Self-pay

## 2019-05-29 ENCOUNTER — Telehealth: Payer: Self-pay | Admitting: *Deleted

## 2019-05-29 DIAGNOSIS — M1712 Unilateral primary osteoarthritis, left knee: Secondary | ICD-10-CM | POA: Insufficient documentation

## 2019-05-29 DIAGNOSIS — Z01812 Encounter for preprocedural laboratory examination: Secondary | ICD-10-CM | POA: Insufficient documentation

## 2019-05-29 HISTORY — DX: Other specified postprocedural states: Z98.890

## 2019-05-29 HISTORY — DX: Pneumonia, unspecified organism: J18.9

## 2019-05-29 HISTORY — DX: Cardiac arrhythmia, unspecified: I49.9

## 2019-05-29 HISTORY — DX: Other specified postprocedural states: R11.2

## 2019-05-29 HISTORY — DX: Unspecified osteoarthritis, unspecified site: M19.90

## 2019-05-29 HISTORY — DX: Personal history of other diseases of the digestive system: Z87.19

## 2019-05-29 LAB — CBC
HCT: 41.3 % (ref 36.0–46.0)
Hemoglobin: 13.6 g/dL (ref 12.0–15.0)
MCH: 31 pg (ref 26.0–34.0)
MCHC: 32.9 g/dL (ref 30.0–36.0)
MCV: 94.1 fL (ref 80.0–100.0)
Platelets: 191 10*3/uL (ref 150–400)
RBC: 4.39 MIL/uL (ref 3.87–5.11)
RDW: 12.2 % (ref 11.5–15.5)
WBC: 7.6 10*3/uL (ref 4.0–10.5)
nRBC: 0 % (ref 0.0–0.2)

## 2019-05-29 LAB — BASIC METABOLIC PANEL
Anion gap: 9 (ref 5–15)
BUN: 26 mg/dL — ABNORMAL HIGH (ref 8–23)
CO2: 23 mmol/L (ref 22–32)
Calcium: 9 mg/dL (ref 8.9–10.3)
Chloride: 107 mmol/L (ref 98–111)
Creatinine, Ser: 1.07 mg/dL — ABNORMAL HIGH (ref 0.44–1.00)
GFR calc Af Amer: 59 mL/min — ABNORMAL LOW (ref >60)
GFR calc non Af Amer: 51 mL/min — ABNORMAL LOW (ref >60)
Glucose, Bld: 89 mg/dL (ref 70–99)
Potassium: 4.8 mmol/L (ref 3.5–5.1)
Sodium: 139 mmol/L (ref 135–145)

## 2019-05-29 LAB — SURGICAL PCR SCREEN
MRSA, PCR: NEGATIVE
Staphylococcus aureus: NEGATIVE

## 2019-05-29 NOTE — Telephone Encounter (Signed)
Ortho Bundle pre-op call and survey completed.

## 2019-05-29 NOTE — Care Plan (Signed)
RNCM spoke with patient for a pre-op call and review of the Ortho bundle program prior to her upcoming surgery scheduled with Dr. Ninfa Linden on Friday, 05/31/2019 for a Left Total Knee Arthroplasty. Patient states she has a daughter that will be assisting her after discharge home. She has all DME needed (FWW as well as BSC). Reviewed need for HHPT after discharge home as well as OPPT when appropriate. Patient provided choice in the area and RNCM will make referrals to have these set-up prior to surgery. TOM/THN information sheet will be mailed to patient's home. Patient provided opportunity to ask questions. Reviewed how to contact RNCM with any questions or further case management needs.

## 2019-05-30 NOTE — Progress Notes (Signed)
Anesthesia Chart Review   Case: [id number] Date/Time: 05/31/19 0815   Procedure: LEFT TOTAL KNEE ARTHROPLASTY (Left )   Anesthesia type: Choice   Pre-op diagnosis: endstage osteoarthritis left knee   Location: [facility] ROOM 10 / [facility]   Surgeon: Mcarthur Rossetti, MD      DISCUSSION:73 y.o. never smoker with h/o PONV, HLD, PAF (on Eliquis), hiatal hernia, HTN, sleep apnea, left knee end stage OA scheduled for above procedure 05/31/2019 with Dr. Jean Rosenthal.   Pt cleared by cardiology 04/29/2019.  Per Daune Perch, NP, "[name] was last seen on 12/25/2018 by Dr. Martinique.  Since that day, [name] has done well. She has hx of PAF controlled with Rythmol. She has had no recent recurrences. She has no history of documented CAD or stroke. No diabetes or renal disease.  Prior to her knee pain she was doing all of her housework and yardwork until a couple of weeks ago. She was having no chest discomfort or shortness of breath. She is now using a walker due to knee pain. According to the RCRI she is low risk for major cardiac event perioperatively. Therefore, based on ACC/AHA guidelines, the patient would be at acceptable risk for the planned procedure without further cardiovascular testing. Per office protocol, patient can hold Eliquis for 3 days prior to procedure."  Anticipate pt can proceed with planned procedure barring acute status change.   VS: BP (!) 177/86   Pulse 73   Temp 36.9 C (Oral)   Resp 18   Ht 5\' 3"  (1.6 m)   Wt 85.3 kg   SpO2 96%   BMI 33.30 kg/m   PROVIDERS: Lucille Passy, MD is PCP   Martinique, Peter, MD is Cardiologist  LABS: Labs reviewed: Acceptable for surgery. (all labs ordered are listed, but only abnormal results are displayed)  Labs Reviewed  BASIC METABOLIC PANEL - Abnormal; Notable for the following components:      Result Value   BUN 26 (*)    Creatinine, Ser 1.07 (*)    GFR calc non Af Amer 51 (*)    GFR calc Af Amer 59 (*)     All other components within normal limits  SURGICAL PCR SCREEN  CBC     IMAGES:   EKG: 12/25/2018 Rate 55 bpm Sinus bradycardia  CV: Myocardial Perfusion Imagine 09/10/2014 Impression Exercise Capacity:  Good exercise capacity. BP Response:  Normal blood pressure response. Clinical Symptoms:  No significant symptoms noted. ECG Impression:  No significant ECG changes with Lexiscan. Comparison with Prior Nuclear Study: No previous nuclear study performed   Overall Impression:  Normal stress nuclear study. Past Medical History:  Diagnosis Date  . Acute upper respiratory infections of unspecified site   . Arthritis   . Carotid bruit   . Cataracts, both eyes   . Chronic insomnia   . Colon polyps   . Diverticulosis    pt denies  . Dysrhythmia    a fib  . Fatty liver   . Hiatal hernia   . History of gallstones   . Hx of adenomatous colonic polyps   . Hyperlipidemia   . Hypertension   . Insomnia, controlled 01/01/2014  . Internal hemorrhoids   . Macular pucker, left eye    surgery planned 01-14-15  . Menopause    noticed in early 8's  . Other acute sinusitis   . Other malaise and fatigue   . Paroxysmal atrial fibrillation (HCC)    on meds  .  Pneumonia    as a small child  . PONV (postoperative nausea and vomiting)   . Pure hypercholesterolemia   . Restless leg syndrome   . Routine general medical examination at a health care facility   . Screening for lipoid disorders   . Sleep apnea    no c-pap  . Special screening for malignant neoplasms, colon     Past Surgical History:  Procedure Laterality Date  . cardiolyte neg  6/06  . cardiovascular stress MRI  12/20/06   afib  . CHOLECYSTECTOMY    . COLONOSCOPY    . EYE SURGERY     cataract   Toric implant bil eyes  . KNEE ARTHROSCOPY     left  . ORIF- L distal radius  9/07   left wrist  . PARS PLANA VITRECTOMY W/ REPAIR OF MACULAR HOLE     left eye  . torn meniscus     bil knees    . TUBAL LIGATION     . UPPER GI ENDOSCOPY      MEDICATIONS: . celecoxib (CELEBREX) 100 MG capsule  . ELIQUIS 5 MG TABS tablet  . hydroxypropyl methylcellulose / hypromellose (ISOPTO TEARS / GONIOVISC) 2.5 % ophthalmic solution  . losartan (COZAAR) 100 MG tablet  . metoprolol succinate (TOPROL-XL) 50 MG 24 hr tablet  . propafenone (RYTHMOL) 300 MG tablet  . traMADol (ULTRAM) 50 MG tablet  . zolpidem (AMBIEN) 10 MG tablet   No current facility-administered medications for this encounter.      [name] [facility] Pre-Surgical Testing [phone] 05/30/19 11:27 AM

## 2019-05-30 NOTE — Anesthesia Preprocedure Evaluation (Addendum)
Anesthesia Evaluation  Patient identified by MRN, date of birth, ID band Patient awake    Reviewed: Allergy & Precautions, H&P , NPO status , Patient's Chart, lab work & pertinent test results, reviewed documented beta blocker date and time   History of Anesthesia Complications (+) PONV  Airway Mallampati: II  TM Distance: >3 FB Neck ROM: Full    Dental no notable dental hx. (+) Teeth Intact, Dental Advisory Given   Pulmonary sleep apnea ,    Pulmonary exam normal breath sounds clear to auscultation       Cardiovascular Exercise Tolerance: Good hypertension, Pt. on medications and Pt. on home beta blockers + dysrhythmias Atrial Fibrillation  Rhythm:Regular Rate:Normal     Neuro/Psych Anxiety Depression negative neurological ROS     GI/Hepatic Neg liver ROS, hiatal hernia,   Endo/Other  negative endocrine ROS  Renal/GU negative Renal ROS  negative genitourinary   Musculoskeletal  (+) Arthritis , Osteoarthritis,    Abdominal   Peds  Hematology negative hematology ROS (+)   Anesthesia Other Findings   Reproductive/Obstetrics negative OB ROS                           Anesthesia Physical Anesthesia Plan  ASA: III  Anesthesia Plan: Spinal   Post-op Pain Management:  Regional for Post-op pain   Induction: Intravenous  PONV Risk Score and Plan: 4 or greater and Ondansetron, Dexamethasone, Propofol infusion and Midazolam  Airway Management Planned: Simple Face Mask  Additional Equipment:   Intra-op Plan:   Post-operative Plan:   Informed Consent: I have reviewed the patients History and Physical, chart, labs and discussed the procedure including the risks, benefits and alternatives for the proposed anesthesia with the patient or authorized representative who has indicated his/her understanding and acceptance.     Dental advisory given  Plan Discussed with: CRNA  Anesthesia  Plan Comments: (See PAT note 05/29/2019, Konrad Felix, PA-C)       Anesthesia Quick Evaluation

## 2019-05-31 ENCOUNTER — Other Ambulatory Visit: Payer: Self-pay

## 2019-05-31 ENCOUNTER — Other Ambulatory Visit: Payer: Self-pay | Admitting: *Deleted

## 2019-05-31 ENCOUNTER — Inpatient Hospital Stay (HOSPITAL_COMMUNITY): Payer: Medicare Other

## 2019-05-31 ENCOUNTER — Encounter (HOSPITAL_COMMUNITY): Payer: Self-pay

## 2019-05-31 ENCOUNTER — Encounter (HOSPITAL_COMMUNITY)
Admission: RE | Disposition: A | Discharge status: Home / Self Care | Payer: Self-pay | Source: Home / Self Care | Attending: Orthopaedic Surgery

## 2019-05-31 ENCOUNTER — Inpatient Hospital Stay (HOSPITAL_COMMUNITY)
Admission: RE | Admit: 2019-05-31 | Discharge: 2019-06-03 | DRG: 470 | Disposition: A | Discharge status: Home / Self Care | Payer: Medicare Other | Attending: Orthopaedic Surgery | Admitting: Orthopaedic Surgery

## 2019-05-31 ENCOUNTER — Ambulatory Visit (HOSPITAL_COMMUNITY): Payer: Medicare Other | Admitting: Certified Registered Nurse Anesthetist

## 2019-05-31 ENCOUNTER — Ambulatory Visit (HOSPITAL_COMMUNITY): Payer: Medicare Other | Admitting: Physician Assistant

## 2019-05-31 DIAGNOSIS — K760 Fatty (change of) liver, not elsewhere classified: Secondary | ICD-10-CM | POA: Diagnosis present

## 2019-05-31 DIAGNOSIS — Z9851 Tubal ligation status: Secondary | ICD-10-CM

## 2019-05-31 DIAGNOSIS — Z803 Family history of malignant neoplasm of breast: Secondary | ICD-10-CM

## 2019-05-31 DIAGNOSIS — Z96652 Presence of left artificial knee joint: Secondary | ICD-10-CM

## 2019-05-31 DIAGNOSIS — Z8249 Family history of ischemic heart disease and other diseases of the circulatory system: Secondary | ICD-10-CM | POA: Diagnosis not present

## 2019-05-31 DIAGNOSIS — M1712 Unilateral primary osteoarthritis, left knee: Secondary | ICD-10-CM | POA: Diagnosis not present

## 2019-05-31 DIAGNOSIS — I480 Paroxysmal atrial fibrillation: Secondary | ICD-10-CM | POA: Diagnosis present

## 2019-05-31 DIAGNOSIS — E785 Hyperlipidemia, unspecified: Secondary | ICD-10-CM | POA: Diagnosis present

## 2019-05-31 DIAGNOSIS — G4733 Obstructive sleep apnea (adult) (pediatric): Secondary | ICD-10-CM | POA: Diagnosis present

## 2019-05-31 DIAGNOSIS — M170 Bilateral primary osteoarthritis of knee: Secondary | ICD-10-CM | POA: Diagnosis present

## 2019-05-31 DIAGNOSIS — Z885 Allergy status to narcotic agent status: Secondary | ICD-10-CM

## 2019-05-31 DIAGNOSIS — Z9049 Acquired absence of other specified parts of digestive tract: Secondary | ICD-10-CM | POA: Diagnosis not present

## 2019-05-31 DIAGNOSIS — I10 Essential (primary) hypertension: Secondary | ICD-10-CM | POA: Diagnosis present

## 2019-05-31 DIAGNOSIS — G2581 Restless legs syndrome: Secondary | ICD-10-CM | POA: Diagnosis present

## 2019-05-31 DIAGNOSIS — G4700 Insomnia, unspecified: Secondary | ICD-10-CM | POA: Diagnosis present

## 2019-05-31 DIAGNOSIS — Z800 Family history of malignant neoplasm of digestive organs: Secondary | ICD-10-CM

## 2019-05-31 DIAGNOSIS — Z471 Aftercare following joint replacement surgery: Secondary | ICD-10-CM | POA: Diagnosis not present

## 2019-05-31 DIAGNOSIS — G8918 Other acute postprocedural pain: Secondary | ICD-10-CM | POA: Diagnosis not present

## 2019-05-31 DIAGNOSIS — F418 Other specified anxiety disorders: Secondary | ICD-10-CM | POA: Diagnosis not present

## 2019-05-31 HISTORY — PX: TOTAL KNEE ARTHROPLASTY: SHX125

## 2019-05-31 SURGERY — ARTHROPLASTY, KNEE, TOTAL
Anesthesia: Spinal | Site: Knee | Laterality: Left

## 2019-05-31 MED ORDER — DEXAMETHASONE SODIUM PHOSPHATE 10 MG/ML IJ SOLN
INTRAMUSCULAR | Status: DC | PRN
  Administered 2019-05-31: 10 mg via INTRAVENOUS

## 2019-05-31 MED ORDER — BUPIVACAINE-EPINEPHRINE (PF) 0.25% -1:200000 IJ SOLN
INTRAMUSCULAR | Status: AC
  Filled 2019-05-31: qty 30

## 2019-05-31 MED ORDER — PROMETHAZINE HCL 25 MG/ML IJ SOLN
6.2500 mg | Freq: Once | INTRAMUSCULAR | Status: AC
  Administered 2019-05-31: 6.25 mg via INTRAVENOUS

## 2019-05-31 MED ORDER — OXYCODONE HCL 5 MG PO TABS
10.0000 mg | ORAL_TABLET | ORAL | Status: DC | PRN
  Administered 2019-06-01 – 2019-06-02 (×2): 10 mg via ORAL

## 2019-05-31 MED ORDER — MENTHOL 3 MG MT LOZG: 1.0000 | LOZENGE | OROMUCOSAL | Status: DC | PRN

## 2019-05-31 MED ORDER — MIDAZOLAM HCL 2 MG/2ML IJ SOLN
INTRAMUSCULAR | Status: AC
  Administered 2019-05-31: 1 mg via INTRAVENOUS
  Filled 2019-05-31: qty 2

## 2019-05-31 MED ORDER — PROMETHAZINE HCL 25 MG/ML IJ SOLN
INTRAMUSCULAR | Status: AC
  Filled 2019-05-31: qty 1

## 2019-05-31 MED ORDER — CEFAZOLIN SODIUM-DEXTROSE 1-4 GM/50ML-% IV SOLN
1.0000 g | Freq: Four times a day (QID) | INTRAVENOUS | Status: AC
  Administered 2019-05-31 (×2): 1 g via INTRAVENOUS
  Filled 2019-05-31 (×2): qty 50

## 2019-05-31 MED ORDER — METOPROLOL SUCCINATE ER 50 MG PO TB24
50.0000 mg | ORAL_TABLET | Freq: Every day | ORAL | Status: DC
  Administered 2019-05-31 – 2019-06-02 (×3): 50 mg via ORAL
  Filled 2019-05-31 (×3): qty 1

## 2019-05-31 MED ORDER — POLYETHYLENE GLYCOL 3350 17 G PO PACK
17.0000 g | PACK | Freq: Every day | ORAL | Status: DC | PRN
  Administered 2019-06-02: 17 g via ORAL
  Filled 2019-05-31: qty 1

## 2019-05-31 MED ORDER — HYDROMORPHONE HCL 1 MG/ML IJ SOLN
0.5000 mg | INTRAMUSCULAR | Status: DC | PRN
  Administered 2019-05-31: 0.5 mg via INTRAVENOUS
  Filled 2019-05-31: qty 1

## 2019-05-31 MED ORDER — SODIUM CHLORIDE 0.9 % IV SOLN
INTRAVENOUS | Status: DC
  Administered 2019-05-31 – 2019-06-01 (×2): via INTRAVENOUS

## 2019-05-31 MED ORDER — APIXABAN 5 MG PO TABS
5.0000 mg | ORAL_TABLET | Freq: Two times a day (BID) | ORAL | Status: DC
  Administered 2019-06-01 – 2019-06-03 (×5): 5 mg via ORAL
  Filled 2019-05-31 (×5): qty 1

## 2019-05-31 MED ORDER — OXYCODONE HCL 5 MG PO TABS
5.0000 mg | ORAL_TABLET | ORAL | Status: DC | PRN
  Administered 2019-06-01: 5 mg via ORAL
  Administered 2019-06-01 (×3): 10 mg via ORAL
  Administered 2019-06-02 (×3): 5 mg via ORAL
  Administered 2019-06-03 (×2): 10 mg via ORAL
  Filled 2019-05-31 (×2): qty 1
  Filled 2019-05-31 (×2): qty 2
  Filled 2019-05-31 (×2): qty 1
  Filled 2019-05-31 (×6): qty 2

## 2019-05-31 MED ORDER — CHLORHEXIDINE GLUCONATE 4 % EX LIQD: 60.0000 mL | Freq: Once | CUTANEOUS | Status: DC

## 2019-05-31 MED ORDER — CEFAZOLIN SODIUM-DEXTROSE 2-4 GM/100ML-% IV SOLN
2.0000 g | INTRAVENOUS | Status: AC
  Administered 2019-05-31: 09:00:00 2 g via INTRAVENOUS
  Filled 2019-05-31: qty 100

## 2019-05-31 MED ORDER — METHOCARBAMOL 500 MG IVPB - SIMPLE MED
500.0000 mg | Freq: Four times a day (QID) | INTRAVENOUS | Status: DC | PRN
  Administered 2019-05-31: 500 mg via INTRAVENOUS
  Filled 2019-05-31: qty 50

## 2019-05-31 MED ORDER — BUPIVACAINE-EPINEPHRINE (PF) 0.5% -1:200000 IJ SOLN
INTRAMUSCULAR | Status: DC | PRN
  Administered 2019-05-31: 20 mL via PERINEURAL

## 2019-05-31 MED ORDER — PHENOL 1.4 % MT LIQD
1.0000 | OROMUCOSAL | Status: DC | PRN
  Filled 2019-05-31: qty 177

## 2019-05-31 MED ORDER — MIDAZOLAM HCL 2 MG/2ML IJ SOLN
1.0000 mg | INTRAMUSCULAR | Status: DC
  Administered 2019-05-31: 08:00:00 1 mg via INTRAVENOUS

## 2019-05-31 MED ORDER — ALUM & MAG HYDROXIDE-SIMETH 200-200-20 MG/5ML PO SUSP: 30.0000 mL | ORAL | Status: DC | PRN

## 2019-05-31 MED ORDER — PROPOFOL 500 MG/50ML IV EMUL
INTRAVENOUS | Status: DC | PRN
  Administered 2019-05-31: 25 ug/kg/min via INTRAVENOUS

## 2019-05-31 MED ORDER — HYPROMELLOSE (GONIOSCOPIC) 2.5 % OP SOLN: 1.0000 [drp] | Freq: Three times a day (TID) | OPHTHALMIC | Status: DC | PRN

## 2019-05-31 MED ORDER — DIPHENHYDRAMINE HCL 12.5 MG/5ML PO ELIX: 12.5000 mg | ORAL_SOLUTION | ORAL | Status: DC | PRN

## 2019-05-31 MED ORDER — ACETAMINOPHEN 325 MG PO TABS
325.0000 mg | ORAL_TABLET | Freq: Four times a day (QID) | ORAL | Status: DC | PRN
  Administered 2019-06-01: 21:00:00 650 mg via ORAL
  Filled 2019-05-31: qty 2

## 2019-05-31 MED ORDER — ZOLPIDEM TARTRATE 5 MG PO TABS
5.0000 mg | ORAL_TABLET | Freq: Every evening | ORAL | Status: DC | PRN
  Administered 2019-06-01 – 2019-06-02 (×3): 5 mg via ORAL
  Filled 2019-05-31 (×3): qty 1

## 2019-05-31 MED ORDER — TRANEXAMIC ACID-NACL 1000-0.7 MG/100ML-% IV SOLN
1000.0000 mg | INTRAVENOUS | Status: AC
  Administered 2019-05-31: 1000 mg via INTRAVENOUS
  Filled 2019-05-31: qty 100

## 2019-05-31 MED ORDER — PROPOFOL 10 MG/ML IV BOLUS
INTRAVENOUS | Status: DC | PRN
  Administered 2019-05-31 (×2): 10 mg via INTRAVENOUS

## 2019-05-31 MED ORDER — CELECOXIB 100 MG PO CAPS
100.0000 mg | ORAL_CAPSULE | Freq: Two times a day (BID) | ORAL | Status: DC
  Administered 2019-05-31 – 2019-06-03 (×6): 100 mg via ORAL
  Filled 2019-05-31 (×8): qty 1

## 2019-05-31 MED ORDER — POLYVINYL ALCOHOL 1.4 % OP SOLN
1.0000 [drp] | Freq: Three times a day (TID) | OPHTHALMIC | Status: DC | PRN
  Filled 2019-05-31: qty 15

## 2019-05-31 MED ORDER — PROMETHAZINE HCL 25 MG/ML IJ SOLN
12.5000 mg | Freq: Four times a day (QID) | INTRAMUSCULAR | Status: DC | PRN
  Administered 2019-05-31: 12.5 mg via INTRAVENOUS
  Filled 2019-05-31: qty 1

## 2019-05-31 MED ORDER — HYDROMORPHONE HCL 1 MG/ML IJ SOLN
INTRAMUSCULAR | Status: AC
  Filled 2019-05-31: qty 1

## 2019-05-31 MED ORDER — POVIDONE-IODINE 10 % EX SWAB: 2.0000 "application " | Freq: Once | CUTANEOUS | Status: DC

## 2019-05-31 MED ORDER — LOSARTAN POTASSIUM 50 MG PO TABS
100.0000 mg | ORAL_TABLET | Freq: Every day | ORAL | Status: DC
  Administered 2019-06-01: 09:00:00 100 mg via ORAL
  Filled 2019-05-31 (×2): qty 2

## 2019-05-31 MED ORDER — BUPIVACAINE IN DEXTROSE 0.75-8.25 % IT SOLN
INTRATHECAL | Status: DC | PRN
  Administered 2019-05-31: 1.6 mL via INTRATHECAL

## 2019-05-31 MED ORDER — METOCLOPRAMIDE HCL 5 MG PO TABS: 5.0000 mg | ORAL_TABLET | Freq: Three times a day (TID) | ORAL | Status: DC | PRN

## 2019-05-31 MED ORDER — ONDANSETRON HCL 4 MG/2ML IJ SOLN
4.0000 mg | Freq: Four times a day (QID) | INTRAMUSCULAR | Status: DC | PRN
  Administered 2019-05-31: 4 mg via INTRAVENOUS
  Filled 2019-05-31: qty 2

## 2019-05-31 MED ORDER — PROPAFENONE HCL 150 MG PO TABS
300.0000 mg | ORAL_TABLET | Freq: Two times a day (BID) | ORAL | Status: DC
  Administered 2019-05-31 – 2019-06-03 (×6): 300 mg via ORAL
  Filled 2019-05-31 (×7): qty 2

## 2019-05-31 MED ORDER — FENTANYL CITRATE (PF) 100 MCG/2ML IJ SOLN
50.0000 ug | INTRAMUSCULAR | Status: DC
  Administered 2019-05-31: 50 ug via INTRAVENOUS

## 2019-05-31 MED ORDER — HYDROMORPHONE HCL 1 MG/ML IJ SOLN
0.2500 mg | INTRAMUSCULAR | Status: DC | PRN
  Administered 2019-05-31 (×3): 0.5 mg via INTRAVENOUS

## 2019-05-31 MED ORDER — ONDANSETRON HCL 4 MG/2ML IJ SOLN
INTRAMUSCULAR | Status: DC | PRN
  Administered 2019-05-31: 4 mg via INTRAVENOUS

## 2019-05-31 MED ORDER — METOCLOPRAMIDE HCL 5 MG/ML IJ SOLN
5.0000 mg | Freq: Three times a day (TID) | INTRAMUSCULAR | Status: DC | PRN
  Administered 2019-05-31: 10 mg via INTRAVENOUS
  Filled 2019-05-31: qty 2

## 2019-05-31 MED ORDER — ACETAMINOPHEN 500 MG PO TABS
1000.0000 mg | ORAL_TABLET | Freq: Once | ORAL | Status: AC
  Administered 2019-05-31: 07:00:00 1000 mg via ORAL
  Filled 2019-05-31: qty 2

## 2019-05-31 MED ORDER — PROPOFOL 10 MG/ML IV BOLUS
INTRAVENOUS | Status: AC
  Filled 2019-05-31: qty 80

## 2019-05-31 MED ORDER — ONDANSETRON HCL 4 MG PO TABS
4.0000 mg | ORAL_TABLET | Freq: Four times a day (QID) | ORAL | Status: DC | PRN
  Administered 2019-06-01 – 2019-06-03 (×3): 4 mg via ORAL
  Filled 2019-05-31 (×3): qty 1

## 2019-05-31 MED ORDER — ONDANSETRON HCL 4 MG/2ML IJ SOLN
INTRAMUSCULAR | Status: AC
  Filled 2019-05-31: qty 2

## 2019-05-31 MED ORDER — DEXAMETHASONE SODIUM PHOSPHATE 10 MG/ML IJ SOLN
INTRAMUSCULAR | Status: AC
  Filled 2019-05-31: qty 1

## 2019-05-31 MED ORDER — FENTANYL CITRATE (PF) 100 MCG/2ML IJ SOLN
INTRAMUSCULAR | Status: AC
  Administered 2019-05-31: 50 ug via INTRAVENOUS
  Filled 2019-05-31: qty 2

## 2019-05-31 MED ORDER — GABAPENTIN 100 MG PO CAPS
100.0000 mg | ORAL_CAPSULE | Freq: Three times a day (TID) | ORAL | Status: DC
  Administered 2019-05-31 – 2019-06-03 (×9): 100 mg via ORAL
  Filled 2019-05-31 (×9): qty 1

## 2019-05-31 MED ORDER — DOCUSATE SODIUM 100 MG PO CAPS
100.0000 mg | ORAL_CAPSULE | Freq: Two times a day (BID) | ORAL | Status: DC
  Administered 2019-05-31 – 2019-06-03 (×6): 100 mg via ORAL
  Filled 2019-05-31 (×6): qty 1

## 2019-05-31 MED ORDER — PANTOPRAZOLE SODIUM 40 MG PO TBEC
40.0000 mg | DELAYED_RELEASE_TABLET | Freq: Every day | ORAL | Status: DC
  Administered 2019-06-01 – 2019-06-03 (×3): 40 mg via ORAL
  Filled 2019-05-31 (×3): qty 1

## 2019-05-31 MED ORDER — SODIUM CHLORIDE 0.9% FLUSH
INTRAVENOUS | Status: DC | PRN
  Administered 2019-05-31: 1000 mL

## 2019-05-31 MED ORDER — METHOCARBAMOL 500 MG PO TABS
500.0000 mg | ORAL_TABLET | Freq: Four times a day (QID) | ORAL | Status: DC | PRN
  Administered 2019-06-01 – 2019-06-02 (×4): 500 mg via ORAL
  Filled 2019-05-31 (×4): qty 1

## 2019-05-31 MED ORDER — LACTATED RINGERS IV SOLN
INTRAVENOUS | Status: DC
  Administered 2019-05-31: 09:00:00 via INTRAVENOUS
  Administered 2019-05-31: 1000 mL via INTRAVENOUS
  Administered 2019-05-31: 07:00:00 via INTRAVENOUS

## 2019-05-31 MED ORDER — METHOCARBAMOL 500 MG IVPB - SIMPLE MED
INTRAVENOUS | Status: AC
  Administered 2019-05-31: 11:00:00 500 mg via INTRAVENOUS
  Filled 2019-05-31: qty 50

## 2019-05-31 SURGICAL SUPPLY — 62 items
APL SKNCLS STERI-STRIP NONHPOA (GAUZE/BANDAGES/DRESSINGS) ×2
BAG SPEC THK2 15X12 ZIP CLS (MISCELLANEOUS)
BAG ZIPLOCK 12X15 (MISCELLANEOUS) IMPLANT
BASEPLATE TIBIAL TRIATHALON 3 (Plate) ×2 IMPLANT
BEARIN INSERT TIBIAL SZ 3 11 (Insert) ×3 IMPLANT
BEARING INSERT TIBIAL SZ 3 11 (Insert) IMPLANT
BENZOIN TINCTURE PRP APPL 2/3 (GAUZE/BANDAGES/DRESSINGS) ×4 IMPLANT
BLADE SAG 18X100X1.27 (BLADE) ×2 IMPLANT
BLADE SURG SZ10 CARB STEEL (BLADE) ×6 IMPLANT
BNDG ELASTIC 6X5.8 VLCR STR LF (GAUZE/BANDAGES/DRESSINGS) ×5 IMPLANT
BNDG GAUZE ELAST 4 BULKY (GAUZE/BANDAGES/DRESSINGS) ×2 IMPLANT
BOWL SMART MIX CTS (DISPOSABLE) ×2 IMPLANT
BSPLAT TIB 3 CMNT PRM STRL KN (Plate) ×1 IMPLANT
CEMENT BONE SIMPLEX SPEEDSET (Cement) ×4 IMPLANT
CLOSURE WOUND 1/2 X4 (GAUZE/BANDAGES/DRESSINGS) ×2
COVER SURGICAL LIGHT HANDLE (MISCELLANEOUS) ×3 IMPLANT
COVER WAND RF STERILE (DRAPES) ×2 IMPLANT
CUFF TOURN SGL QUICK 34 (TOURNIQUET CUFF) ×3
CUFF TRNQT CYL 34X4.125X (TOURNIQUET CUFF) ×1 IMPLANT
DECANTER SPIKE VIAL GLASS SM (MISCELLANEOUS) ×2 IMPLANT
DRAPE U-SHAPE 47X51 STRL (DRAPES) ×3 IMPLANT
DRSG PAD ABDOMINAL 8X10 ST (GAUZE/BANDAGES/DRESSINGS) ×4 IMPLANT
DURAPREP 26ML APPLICATOR (WOUND CARE) ×3 IMPLANT
ELECT REM PT RETURN 15FT ADLT (MISCELLANEOUS) ×3 IMPLANT
FEMORAL PEG DISTAL FIXATION (Orthopedic Implant) ×2 IMPLANT
FEMORAL TRIATH POST STAB  SZ3 (Orthopedic Implant) ×2 IMPLANT
FEMORAL TRIATH POST STAB SZ3 (Orthopedic Implant) IMPLANT
GAUZE SPONGE 4X4 12PLY STRL (GAUZE/BANDAGES/DRESSINGS) ×3 IMPLANT
GAUZE XEROFORM 1X8 LF (GAUZE/BANDAGES/DRESSINGS) ×4 IMPLANT
GLOVE BIO SURGEON STRL SZ7.5 (GLOVE) ×3 IMPLANT
GLOVE BIOGEL PI IND STRL 8 (GLOVE) ×2 IMPLANT
GLOVE BIOGEL PI INDICATOR 8 (GLOVE) ×4
GLOVE ECLIPSE 8.0 STRL XLNG CF (GLOVE) ×3 IMPLANT
GOWN STRL REUS W/TWL XL LVL3 (GOWN DISPOSABLE) ×6 IMPLANT
HANDPIECE INTERPULSE COAX TIP (DISPOSABLE) ×3
HOLDER FOLEY CATH W/STRAP (MISCELLANEOUS) ×2 IMPLANT
IMMOBILIZER KNEE 20 (SOFTGOODS) ×3
IMMOBILIZER KNEE 20 THIGH 36 (SOFTGOODS) ×1 IMPLANT
KIT TURNOVER KIT A (KITS) ×2 IMPLANT
NS IRRIG 1000ML POUR BTL (IV SOLUTION) ×3 IMPLANT
PACK TOTAL KNEE CUSTOM (KITS) ×3 IMPLANT
PADDING CAST ABS 6INX4YD NS (CAST SUPPLIES) ×2
PADDING CAST ABS COTTON 6X4 NS (CAST SUPPLIES) IMPLANT
PADDING CAST COTTON 6X4 STRL (CAST SUPPLIES) ×6 IMPLANT
PATELLA TRIATHLON SZ 29 9 MM (Orthopedic Implant) ×2 IMPLANT
PIN FLUTED HEDLESS FIX 3.5X1/8 (PIN) ×2 IMPLANT
PROTECTOR NERVE ULNAR (MISCELLANEOUS) ×3 IMPLANT
SET HNDPC FAN SPRY TIP SCT (DISPOSABLE) ×1 IMPLANT
SET PAD KNEE POSITIONER (MISCELLANEOUS) ×3 IMPLANT
STAPLER VISISTAT 35W (STAPLE) ×2 IMPLANT
STRIP CLOSURE SKIN 1/2X4 (GAUZE/BANDAGES/DRESSINGS) ×2 IMPLANT
SUT MNCRL AB 4-0 PS2 18 (SUTURE) ×2 IMPLANT
SUT VIC AB 0 CT1 27 (SUTURE) ×3
SUT VIC AB 0 CT1 27XBRD ANTBC (SUTURE) ×1 IMPLANT
SUT VIC AB 0 CT1 36 (SUTURE) ×2 IMPLANT
SUT VIC AB 1 CT1 36 (SUTURE) ×6 IMPLANT
SUT VIC AB 2-0 CT1 27 (SUTURE) ×6
SUT VIC AB 2-0 CT1 TAPERPNT 27 (SUTURE) ×2 IMPLANT
TRAY FOLEY MTR SLVR 16FR STAT (SET/KITS/TRAYS/PACK) ×3 IMPLANT
WATER STERILE IRR 1000ML POUR (IV SOLUTION) ×3 IMPLANT
WRAP KNEE MAXI GEL POST OP (GAUZE/BANDAGES/DRESSINGS) ×5 IMPLANT
YANKAUER SUCT BULB TIP 10FT TU (MISCELLANEOUS) ×3 IMPLANT

## 2019-05-31 NOTE — Brief Op Note (Signed)
05/31/2019  9:57 AM  PATIENT:  Debbora Lacrosse  73 y.o. female  PRE-OPERATIVE DIAGNOSIS:  endstage osteoarthritis left knee  POST-OPERATIVE DIAGNOSIS:  endstage osteoarthritis left knee  PROCEDURE:  Procedure(s): LEFT TOTAL KNEE ARTHROPLASTY (Left)  SURGEON:  Surgeon(s) and Role:    Mcarthur Rossetti, MD - Primary  PHYSICIAN ASSISTANT: Benita Stabile, PA-C  ANESTHESIA:   regional and spinal  EBL:  25 mL   COUNTS:  YES  TOURNIQUET:   Total Tourniquet Time Documented: Thigh (Left) - 42 minutes Total: Thigh (Left) - 42 minutes   DICTATION: .Other Dictation: Dictation Number [id number]  PLAN OF CARE: Admit to inpatient   PATIENT DISPOSITION:  PACU - hemodynamically stable.   Delay start of Pharmacological VTE agent (>24hrs) due to surgical blood loss or risk of bleeding: no

## 2019-05-31 NOTE — Anesthesia Postprocedure Evaluation (Signed)
Anesthesia Post Note  Patient: [name]  Procedure(s) Performed: LEFT TOTAL KNEE ARTHROPLASTY (Left Knee)     Patient location during evaluation: PACU Anesthesia Type: Spinal and Regional Level of consciousness: oriented and awake and alert Pain management: pain level controlled Vital Signs Assessment: post-procedure vital signs reviewed and stable Respiratory status: spontaneous breathing, respiratory function stable and patient connected to nasal cannula oxygen Cardiovascular status: blood pressure returned to baseline and stable Postop Assessment: no headache, no backache, no apparent nausea or vomiting, spinal receding and patient able to bend at knees Anesthetic complications: no    Last Vitals:  Vitals:   05/31/19 1145 05/31/19 1200  BP: (!) 154/75 (!) 148/69  Pulse: (!) 59 (!) 57  Resp: (!) 6 (!) 8  Temp:  (!) 36.3 C  SpO2: 100% 100%    Last Pain:  Vitals:   05/31/19 1200  TempSrc:   PainSc: Asleep                 Conna Terada,W. EDMOND

## 2019-05-31 NOTE — Care Plan (Signed)
RNCM following for Ortho Bundle. Patient verbalized she has a daughter that will assist after discharge home. She has all DME needed post-op. Referral made to [facility]- [facility] provided. Outpatient PT already set up to begin after initial post-op appointment with MD. [facility] will continue to follow for all case management needs. Please contact if needed. Jamse Arn, RN, BSN, [place] [phone].

## 2019-05-31 NOTE — Care Plan (Signed)
RNCM following for Ortho Bundle. Patient verbalized she has a daughter that will assist after discharge home. She has all DME needed post-op. Referral made to [facility]- Choice provided. Outpatient PT already set up to begin after initial post-op appointment with MD. [facility] will continue to follow for all case management needs. Please contact if needed. Jamse Arn, RN, BSN, [place] [phone].

## 2019-05-31 NOTE — Anesthesia Procedure Notes (Signed)
Spinal  Patient location during procedure: OR Start time: 05/31/2019 8:38 AM End time: 05/31/2019 8:40 AM Staffing Anesthesiologist: Roderic Palau, MD Performed: anesthesiologist  Preanesthetic Checklist Completed: patient identified, surgical consent, pre-op evaluation, timeout performed, IV checked, risks and benefits discussed and monitors and equipment checked Spinal Block Patient position: sitting Prep: DuraPrep Patient monitoring: cardiac monitor, continuous pulse ox and blood pressure Approach: midline Location: L4-5 (CRNA attempted at L3-4) Injection technique: single-shot Needle Needle type: Pencan  Needle gauge: 24 G Needle length: 9 cm Assessment Sensory level: T8 Additional Notes Functioning IV was confirmed and monitors were applied. Sterile prep and drape, including hand hygiene and sterile gloves were used. The patient was positioned and the spine was prepped. The skin was anesthetized with lidocaine.  Free flow of clear CSF was obtained prior to injecting local anesthetic into the CSF.  The spinal needle aspirated freely following injection.  The needle was carefully withdrawn.  The patient tolerated the procedure well. CRNA attempted prior to MD.

## 2019-05-31 NOTE — Transfer of Care (Signed)
Immediate Anesthesia Transfer of Care Note  Patient: [name]  Procedure(s) Performed: LEFT TOTAL KNEE ARTHROPLASTY (Left Knee)  Patient Location: PACU  Anesthesia Type:Spinal and MAC combined with regional for post-op pain  Level of Consciousness: awake, alert , oriented and patient cooperative  Airway & Oxygen Therapy: Patient spontaneously breathing on face mask.  Post-op Assessment: Report given to RN and Post -op Vital signs reviewed and stable  Post vital signs: Reviewed and stable  Last Vitals:  Vitals Value Taken Time  BP 109/80 05/31/19 1024  Temp 36.6 C 05/31/19 1023  Pulse 65 05/31/19 1025  Resp 18 05/31/19 1025  SpO2 100 % 05/31/19 1025  Vitals shown include unvalidated device data.  Last Pain:  Vitals:   05/31/19 0747  TempSrc:   PainSc: 0-No pain         Complications: No apparent anesthesia complications

## 2019-05-31 NOTE — Progress Notes (Signed)
Assisted Dr. Edmond Fitzgerald with left, ultrasound guided, adductor canal block. Side rails up, monitors on throughout procedure. See vital signs in flow sheet. Tolerated Procedure well. 

## 2019-05-31 NOTE — Evaluation (Signed)
Physical Therapy Evaluation Patient Details Name: [name] MRN: [id number] DOB: Apr 20, 1946 Today's Date: 05/31/2019   History of Present Illness  Pt s/p L TKR  Clinical Impression  Pt s/p L TKR and presents with decreased L LE strength/ROM and post op pain limiting functional mobility.  Pt should progress to dc home with assist of dtr.    Follow Up Recommendations Home health PT;Follow surgeon's recommendation for DC plan and follow-up therapies    Equipment Recommendations  None recommended by PT    Recommendations for Other Services       Precautions / Restrictions Precautions Precautions: Fall;Knee Required Braces or Orthoses: Knee Immobilizer - Left Knee Immobilizer - Left: Discontinue once straight leg raise with < 10 degree lag Restrictions Weight Bearing Restrictions: No Other Position/Activity Restrictions: WBAT      Mobility  Bed Mobility Overal bed mobility: Needs Assistance Bed Mobility: Supine to Sit;Sit to Supine     Supine to sit: Mod assist Sit to supine: Mod assist   General bed mobility comments: cues for sequence and physical assist to manage L LE and control trunk  Transfers Overall transfer level: Needs assistance Equipment used: Rolling walker (2 wheeled) Transfers: Sit to/from Stand Sit to Stand: Min assist;Mod assist         General transfer comment: cues for LE management and use of UEs to self assist  Ambulation/Gait Ambulation/Gait assistance: Min assist;+2 safety/equipment Gait Distance (Feet): 3 Feet Assistive device: Rolling walker (2 wheeled) Gait Pattern/deviations: Step-to pattern;Decreased step length - right;Decreased step length - left;Shuffle;Trunk flexed Gait velocity: decr   General Gait Details: Pt side-stepped up side of bed only - ltd by nausea  Stairs            Wheelchair Mobility    Modified Rankin (Stroke Patients Only)       Balance Overall balance assessment: Needs  assistance Sitting-balance support: No upper extremity supported;Feet supported Sitting balance-Leahy Scale: Good     Standing balance support: Bilateral upper extremity supported Standing balance-Leahy Scale: Poor                               Pertinent Vitals/Pain Pain Assessment: Faces Faces Pain Scale: Hurts little more Pain Location: L knee Pain Descriptors / Indicators: Aching;Sore Pain Intervention(s): Limited activity within patient's tolerance;Monitored during session;Premedicated before session;Ice applied    Home Living Family/patient expects to be discharged to:: Private residence Living Arrangements: Alone Available Help at Discharge: Family;Available 24 hours/day Type of Home: House Home Access: Stairs to enter Entrance Stairs-Rails: None Entrance Stairs-Number of Steps: 2 Home Layout: One level Home Equipment: Walker - 2 wheels;Bedside commode;Shower seat Additional Comments: Daughter will assist first week    Prior Function Level of Independence: Independent;Independent with assistive device(s)         Comments: Using RW as needed     Hand Dominance        Extremity/Trunk Assessment   Upper Extremity Assessment Upper Extremity Assessment: Overall WFL for tasks assessed    Lower Extremity Assessment Lower Extremity Assessment: LLE deficits/detail       Communication   Communication: HOH  Cognition Arousal/Alertness: Awake/alert Behavior During Therapy: WFL for tasks assessed/performed Overall Cognitive Status: Within Functional Limits for tasks assessed  General Comments      Exercises Total Joint Exercises Ankle Circles/Pumps: AROM;Both;15 reps;Supine   Assessment/Plan    PT Assessment Patient needs continued PT services  PT Problem List Decreased strength;Decreased range of motion;Decreased activity tolerance;Decreased balance;Decreased mobility;Decreased knowledge  of use of DME;Pain       PT Treatment Interventions DME instruction;Gait training;Stair training;Functional mobility training;Therapeutic activities;Therapeutic exercise;Patient/family education    PT Goals (Current goals can be found in the Care Plan section)  Acute Rehab PT Goals Patient Stated Goal: Regain IND PT Goal Formulation: With patient Time For Goal Achievement: 06/07/19 Potential to Achieve Goals: Good    Frequency 7X/week   Barriers to discharge        Co-evaluation               AM-PAC PT "6 Clicks" Mobility  Outcome Measure Help needed turning from your back to your side while in a flat bed without using bedrails?: A Lot Help needed moving from lying on your back to sitting on the side of a flat bed without using bedrails?: A Lot Help needed moving to and from a bed to a chair (including a wheelchair)?: A Lot Help needed standing up from a chair using your arms (e.g., wheelchair or bedside chair)?: A Lot Help needed to walk in hospital room?: A Lot Help needed climbing 3-5 steps with a railing? : Total 6 Click Score: 11    End of Session Equipment Utilized During Treatment: Gait belt;Left knee immobilizer Activity Tolerance: Other (comment)(nausea) Patient left: in bed;with call bell/phone within reach Nurse Communication: Mobility status PT Visit Diagnosis: Difficulty in walking, not elsewhere classified (R26.2)    Time: 4818-5631 PT Time Calculation (min) (ACUTE ONLY): 19 min   Charges:   PT Evaluation $PT Eval Low Complexity: [place] Pager [phone] Office [phone]   Jumana Paccione 05/31/2019, 5:39 PM

## 2019-05-31 NOTE — H&P (Signed)
TOTAL KNEE ADMISSION H&P  Patient is being admitted for left total knee arthroplasty.  Subjective:  Chief Complaint:left knee pain.  HPI: [name], 73 y.o. female, has a history of pain and functional disability in the left knee due to arthritis and has failed non-surgical conservative treatments for greater than 12 weeks to includeNSAID's and/or analgesics, corticosteriod injections, viscosupplementation injections, flexibility and strengthening excercises, use of assistive devices, weight reduction as appropriate and activity modification.  Onset of symptoms was gradual, starting 5 years ago with gradually worsening course since that time. The patient noted prior procedures on the knee to include  arthroscopy on the left knee(s).  Patient currently rates pain in the left knee(s) at 10 out of 10 with activity. Patient has night pain, worsening of pain with activity and weight bearing, pain that interferes with activities of daily living, pain with passive range of motion, crepitus and joint swelling.  Patient has evidence of subchondral sclerosis, periarticular osteophytes and joint space narrowing by imaging studies. There is no active infection.  Patient Active Problem List   Diagnosis Date Noted  . Unilateral primary osteoarthritis, right knee 04/23/2019  . Unilateral primary osteoarthritis, left knee 04/23/2019  . Obesity due to excess calories with serious comorbidity 07/18/2018  . Chronic insomnia 07/18/2018  . Anxiety associated with depression 07/18/2018  . Morbid obesity due to excess calories ([place]) 04/25/2016  . OSA (obstructive sleep apnea) 04/25/2016  . Paroxysmal atrial fibrillation ([place]) 04/25/2016  . Obstructive sleep apnea 06/04/2015  . Insomnia w/ sleep apnea 06/04/2015  . Hypoxemia 06/04/2015  . Encounter for therapeutic drug monitoring 02/10/2015  . Insomnia, controlled 01/01/2014  . Skin lesion of breast 06/05/2013  . Urinary incontinence, mixed 06/05/2013  .  Chronic anticoagulation 09/12/2012  . Atrial fibrillation ([place]) 01/10/2011  . Carotid stenosis 01/10/2011  . DYSPNEA 01/10/2011  . COLONIC POLYPS, ADENOMATOUS, HX OF 11/04/2010  . HEMORRHOIDS-INTERNAL 11/03/2010  . HIATAL HERNIA 11/03/2010  . DIVERTICULAR DISEASE 11/03/2010  . RECTAL BLEEDING 11/03/2010  . FATTY LIVER DISEASE, HX OF 11/03/2010  . HYPERCHOLESTEROLEMIA 08/10/2010  . Other fatigue 12/22/2009  . CHOLELITHIASIS 08/06/2008  . Essential hypertension 07/25/2008  . INSOMNIA, CHRONIC 05/21/2007  . RESTLESS LEG SYNDROME 05/21/2007  . SLEEP APNEA 05/21/2007   Past Medical History:  Diagnosis Date  . Acute upper respiratory infections of unspecified site   . Arthritis   . Carotid bruit   . Cataracts, both eyes   . Chronic insomnia   . Colon polyps   . Diverticulosis    pt denies  . Dysrhythmia    a fib  . Fatty liver   . Hiatal hernia   . History of gallstones   . Hx of adenomatous colonic polyps   . Hyperlipidemia   . Hypertension   . Insomnia, controlled 01/01/2014  . Internal hemorrhoids   . Macular pucker, left eye    surgery planned 01-14-15  . Menopause    noticed in early 36's  . Other acute sinusitis   . Other malaise and fatigue   . Paroxysmal atrial fibrillation (HCC)    on meds  . Pneumonia    as a small child  . PONV (postoperative nausea and vomiting)   . Pure hypercholesterolemia   . Restless leg syndrome   . Routine general medical examination at a health care facility   . Screening for lipoid disorders   . Sleep apnea    no c-pap  . Special screening for malignant neoplasms, colon  Past Surgical History:  Procedure Laterality Date  . cardiolyte neg  6/06  . cardiovascular stress MRI  12/20/06   afib  . CHOLECYSTECTOMY    . COLONOSCOPY    . EYE SURGERY     cataract   Toric implant bil eyes  . KNEE ARTHROSCOPY     left  . ORIF- L distal radius  9/07   left wrist  . PARS PLANA VITRECTOMY W/ REPAIR OF MACULAR HOLE     left eye  .  torn meniscus     bil knees    . TUBAL LIGATION    . UPPER GI ENDOSCOPY      Current Facility-Administered Medications  Medication Dose Route Frequency Provider Last Rate Last Dose  . acetaminophen (TYLENOL) tablet 1,000 mg  1,000 mg Oral Once Roderic Palau, MD      . ceFAZolin (ANCEF) IVPB 2g/100 mL premix  2 g Intravenous On Call to OR Pete Pelt, PA-C      . chlorhexidine (HIBICLENS) 4 % liquid 4 application  60 mL Topical Once Pete Pelt, PA-C      . fentaNYL (SUBLIMAZE) 100 MCG/2ML injection           . fentaNYL (SUBLIMAZE) injection 50-100 mcg  50-100 mcg Intravenous Madilyn Hook, MD      . lactated ringers infusion   Intravenous Continuous Montez Hageman, MD      . midazolam (VERSED) 2 MG/2ML injection           . midazolam (VERSED) injection 1-2 mg  1-2 mg Intravenous Madilyn Hook, MD      . povidone-iodine 10 % swab 2 application  2 application Topical Once Erskine Emery W, PA-C      . tranexamic acid (CYKLOKAPRON) IVPB 1,000 mg  1,000 mg Intravenous To OR Pete Pelt, PA-C       Allergies  Allergen Reactions  . Codeine Nausea And Vomiting    Social History   Tobacco Use  . Smoking status: Never Smoker  . Smokeless tobacco: Never Used  Substance Use Topics  . Alcohol use: Not Currently    Comment: rarely    Family History  Problem Relation Age of Onset  . Breast cancer Mother   . Heart disease Father   . Colon cancer Cousin 24  . Diabetes Other        cousins     Review of Systems  Musculoskeletal: Positive for back pain and joint pain.  All other systems reviewed and are negative.   Objective:  Physical Exam  Constitutional: She is oriented to person, place, and time. She appears well-developed and well-nourished.  HENT:  Head: Normocephalic and atraumatic.  Eyes: Pupils are equal, round, and reactive to light. EOM are normal.  Neck: Normal range of motion. Neck supple.  Cardiovascular: Normal rate.   Respiratory: Effort normal.  GI: Soft. Bowel sounds are normal.  Musculoskeletal:     Left knee: She exhibits decreased range of motion, swelling, effusion, abnormal alignment, bony tenderness and abnormal meniscus. Tenderness found. Medial joint line and lateral joint line tenderness noted.  Neurological: She is alert and oriented to person, place, and time.  Skin: Skin is warm and dry.  Psychiatric: She has a normal mood and affect.    Vital signs in last 24 hours: Temp:  [97.7 F (36.5 C)] 97.7 F (36.5 C) (07/24 0614) Pulse Rate:  [61] 61 (07/24 0614) Resp:  [16] 16 (07/24 0614) BP: (185)/(102) 185/102 (07/24 6213)  SpO2:  [99 %] 99 % (07/24 0614) Weight:  [85.3 kg] 85.3 kg (07/24 0614)  Labs:   Estimated body mass index is 33.3 kg/m as calculated from the following:   Height as of this encounter: 5\' 3"  (1.6 m).   Weight as of this encounter: 85.3 kg.   Imaging Review Plain radiographs demonstrate severe degenerative joint disease of the left knee(s). The overall alignment ismild varus. The bone quality appears to be good for age and reported activity level.      Assessment/Plan:  End stage arthritis, left knee   The patient history, physical examination, clinical judgment of the provider and imaging studies are consistent with end stage degenerative joint disease of the left knee(s) and total knee arthroplasty is deemed medically necessary. The treatment options including medical management, injection therapy arthroscopy and arthroplasty were discussed at length. The risks and benefits of total knee arthroplasty were presented and reviewed. The risks due to aseptic loosening, infection, stiffness, patella tracking problems, thromboembolic complications and other imponderables were discussed. The patient acknowledged the explanation, agreed to proceed with the plan and consent was signed. Patient is being admitted for inpatient treatment for surgery, pain control, PT, OT,  prophylactic antibiotics, VTE prophylaxis, progressive ambulation and ADL's and discharge planning. The patient is planning to be discharged home with home health services

## 2019-05-31 NOTE — Anesthesia Procedure Notes (Signed)
Anesthesia Regional Block: Adductor canal block   Pre-Anesthetic Checklist: ,, timeout performed, Correct Patient, Correct Site, Correct Laterality, Correct Procedure, Correct Position, site marked, Risks and benefits discussed, pre-op evaluation,  At surgeon's request and post-op pain management  Laterality: Left  Prep: Maximum Sterile Barrier Precautions used, chloraprep       Needles:  Injection technique: Single-shot  Needle Type: Echogenic Stimulator Needle     Needle Length: 9cm  Needle Gauge: 21     Additional Needles:   Procedures:,,,, ultrasound used (permanent image in chart),,,,  Narrative:  Start time: 05/31/2019 7:47 AM End time: 05/31/2019 7:57 AM Injection made incrementally with aspirations every 5 mL.  Performed by: Personally  Anesthesiologist: Roderic Palau, MD  Additional Notes: 2% Lidocaine skin wheel.

## 2019-05-31 NOTE — Op Note (Signed)
NAME: [name], DIPERNA MEDICAL RECORD VP:[id number] ACCOUNT [id number] DATE OF BIRTH:06-20-1946 FACILITY: [facility] LOCATION: [facility] PHYSICIAN:Cheryll Keisler Kerry [place], MD  OPERATIVE REPORT  DATE OF PROCEDURE:  05/31/2019  PREOPERATIVE DIAGNOSIS:  Primary osteoarthritis and degenerative joint disease, left knee.  POSTOPERATIVE DIAGNOSES:  Osteoarthritis and degenerative joint disease, left knee.  PROCEDURE:  Left total knee arthroplasty.  IMPLANTS:  Stryker Triathlon cemented knee system with size 3 femur, size 3 tibial tray, 11 mm thickness fixed-bearing polyethylene insert, size 29 patellar button.  SURGEON:  Lind Guest. Ninfa Linden, MD  ASSISTANT:  Erskine Emery, PA-C  ANESTHESIA: 1.  Left lower extremity adductor canal block. 2.  Spinal.  ESTIMATED BLOOD LOSS:  Less than 100 mL.  TOURNIQUET TIME:  Less than 1 hour.  ANTIBIOTICS:  Two grams IV Ancef.  COMPLICATIONS:  None.  INDICATIONS:  The patient is a very pleasant 73 year old active female with known and well-documented debilitating arthritis involving her left knee.  This has been well detailed in her notes.  Her x-rays show significant arthritis throughout the knee,  and she has tried and failed all forms of conservative treatment.  At this point, she does wish to proceed with total knee arthroplasty.  We had a long and thorough discussion about what the surgery involves.  We talked about the risk of acute blood loss  anemia, nerve and vessel injury, fracture, infection, DVT, and implant failure.  We talked about the goals being decreased pain, improved mobility, and healthy overall improved quality of life.  DESCRIPTION OF PROCEDURE:  After informed consent was obtained and appropriate left knee was marked, an adductor canal block was obtained in the holding room.  She was then brought to the operating room and placed supine on the operating table.  She was  then sat up on the operating table.  Spinal anesthesia was  then obtained.  She was then laid back in the supine position.  A Foley catheter was placed, and a nonsterile tourniquet was placed around the upper left thigh.  Her left thigh, knee, leg, and  ankle were prepped and draped with DuraPrep and a sterile drape.  A sterile stockinette was applied as well.  A time-out was called, and she was identified as correct patient, correct left knee.  We then used an Esmarch to wrap that leg, and tourniquet  was inflated to 300 mm of pressure.  I then made a direct midline incision over the patella and carried this proximally and distally.  I dissected down the knee joint and carried out a medial parapatellar arthrotomy, finding a very large joint effusion  and significant osteoarthritis throughout her left knee.  With the knee in a flexed position, we removed remnants of ACL, PCL, medial and lateral meniscus and osteophytes throughout the knee.  We then set her extramedullary cutting guide for the tibia,  making our proximal tibia cut, correcting for a neutral slope, and correcting for varus and valgus.  We made this cut to take 9 mm off the high side.  We made this cut without difficulty.  We then went to the femur and used an intramedullary drill for  our distal femoral cutting guide based on a left knee at 5 degrees externally rotated for an 8 mm distal femoral cut.  We made this cut without difficulty and brought the knee back down to full extension and achieved full extension with a 9 mm extension  block.  We then went back to the femur and put our femoral sizing guide based  off the epicondylar axis and [place]iteside line.  Based off of this, we chose a size 3 femur.  We put a 4-in-1 cutting block for size 3 femur, made our anterior and posterior cuts,  followed by our chamfer cuts.  We then made our femoral box cut.  Attention was then turned back to the tibia.  We chose a size 3 tibia tray for coverage, setting the rotation off the tibial tubercle and the femur, and  then we made our keel punch off of  this.  We then placed the 3 trial tibia followed by the size 3 trial left femur.  We tried a 9 and 11 mm polyethylene insert, and I was pleased with the range of motion and stability with the 11 mm insert.  We then made our patellar cut, which was a  very thin patellar cut and drilled 3 holes for a size 29 patellar button.  We then removed all instrumentation from the knee and irrigated the knee with normal saline solution using pulsatile lavage.  We dried the knee real well and mixed our cement.  We  then cemented the real Stryker Triathlon tibial tray size 3 followed by the real size 3 left femur.  We cemented our patellar button and placed our polyethylene insert.  We placed the knee in extended position.  Once the cement hardened, we let the  tourniquet down.  Hemostasis was obtained with electrocautery.  We again were pleased with range of motion and stability.  We then closed the arthrotomy with #1 Vicryl suture followed by 0 Vicryl to close the deep tissue, 2-0 Vicryl to close subcutaneous  tissue, and interrupted staples were placed on the skin.  Xeroform and a well-padded sterile dressing were applied.  She was taken to the recovery room in stable condition.  All final counts were correct.  There were no complications noted.  Note Benita Stabile, PA-C, assisted the entire case.  His assistance was crucial for facilitating all aspects of this case.  LN/NUANCE  D:05/31/2019 T:05/31/2019 JOB:[id number]

## 2019-05-31 NOTE — Anesthesia Procedure Notes (Signed)
Procedure Name: MAC Date/Time: 05/31/2019 8:31 AM Performed by: West Pugh, CRNA Pre-anesthesia Checklist: Patient identified, Emergency Drugs available, Suction available, Patient being monitored and Timeout performed Patient Re-evaluated:Patient Re-evaluated prior to induction Oxygen Delivery Method: Simple face mask and Nasal cannula Preoxygenation: Pre-oxygenation with 100% oxygen Induction Type: IV induction Placement Confirmation: positive ETCO2 Dental Injury: Teeth and Oropharynx as per pre-operative assessment

## 2019-06-01 LAB — BASIC METABOLIC PANEL
Anion gap: 6 (ref 5–15)
BUN: 19 mg/dL (ref 8–23)
CO2: 26 mmol/L (ref 22–32)
Calcium: 8.3 mg/dL — ABNORMAL LOW (ref 8.9–10.3)
Chloride: 109 mmol/L (ref 98–111)
Creatinine, Ser: 0.89 mg/dL (ref 0.44–1.00)
GFR calc Af Amer: >60 mL/min (ref >60)
GFR calc non Af Amer: >60 mL/min (ref >60)
Glucose, Bld: 139 mg/dL — ABNORMAL HIGH (ref 70–99)
Potassium: 4.4 mmol/L (ref 3.5–5.1)
Sodium: 141 mmol/L (ref 135–145)

## 2019-06-01 LAB — CBC
HCT: 35.1 % — ABNORMAL LOW (ref 36.0–46.0)
Hemoglobin: 11.4 g/dL — ABNORMAL LOW (ref 12.0–15.0)
MCH: 30.6 pg (ref 26.0–34.0)
MCHC: 32.5 g/dL (ref 30.0–36.0)
MCV: 94.4 fL (ref 80.0–100.0)
Platelets: 178 10*3/uL (ref 150–400)
RBC: 3.72 MIL/uL — ABNORMAL LOW (ref 3.87–5.11)
RDW: 12.2 % (ref 11.5–15.5)
WBC: 9.3 10*3/uL (ref 4.0–10.5)
nRBC: 0 % (ref 0.0–0.2)

## 2019-06-01 MED ORDER — ONDANSETRON 4 MG PO TBDP: 4.0000 mg | ORAL_TABLET | Freq: Three times a day (TID) | ORAL | 0 refills | Status: DC | PRN

## 2019-06-01 MED ORDER — METHOCARBAMOL 500 MG PO TABS: 500.0000 mg | ORAL_TABLET | Freq: Four times a day (QID) | ORAL | 0 refills | Status: DC | PRN

## 2019-06-01 MED ORDER — OXYCODONE HCL 5 MG PO TABS: 5.0000 mg | ORAL_TABLET | Freq: Four times a day (QID) | ORAL | 0 refills | Status: DC | PRN

## 2019-06-01 NOTE — Progress Notes (Signed)
Physical Therapy Treatment Patient Details Name: [name] MRN: [id number] DOB: [birth year] Today's Date: 06/01/2019    History of Present Illness Pt s/p L TKR    PT Comments    Pt motivated and up to hall to ambulate short distance but ltd by c/o dizziness - BP 140/66 - RN aware.  Follow Up Recommendations  Home health PT;Follow surgeon's recommendation for DC plan and follow-up therapies     Equipment Recommendations  None recommended by PT    Recommendations for Other Services       Precautions / Restrictions Precautions Precautions: Fall;Knee Required Braces or Orthoses: Knee Immobilizer - Left Knee Immobilizer - Left: Discontinue once straight leg raise with < 10 degree lag Restrictions Weight Bearing Restrictions: No Other Position/Activity Restrictions: WBAT    Mobility  Bed Mobility Overal bed mobility: Needs Assistance Bed Mobility: Supine to Sit     Supine to sit: Min assist     General bed mobility comments: cues for sequence and physical assist to manage L LE and control trunk  Transfers Overall transfer level: Needs assistance Equipment used: Rolling walker (2 wheeled) Transfers: Sit to/from Stand Sit to Stand: Min assist         General transfer comment: cues for LE management and use of UEs to self assist  Ambulation/Gait Ambulation/Gait assistance: Min assist Gait Distance (Feet): 68 Feet(and 15' from bathroom) Assistive device: Rolling walker (2 wheeled) Gait Pattern/deviations: Step-to pattern;Decreased step length - right;Decreased step length - left;Shuffle;Trunk flexed Gait velocity: decr   General Gait Details: cues for sequence, posture and position from RW; distance ltd by c/o dizziness - BP 140/86   Stairs             Wheelchair Mobility    Modified Rankin (Stroke Patients Only)       Balance Overall balance assessment: Needs assistance Sitting-balance support: No upper extremity supported;Feet  supported Sitting balance-Leahy Scale: Good     Standing balance support: Bilateral upper extremity supported Standing balance-Leahy Scale: Poor                              Cognition Arousal/Alertness: Awake/alert Behavior During Therapy: WFL for tasks assessed/performed Overall Cognitive Status: Within Functional Limits for tasks assessed                                        Exercises Total Joint Exercises Ankle Circles/Pumps: AROM;Both;15 reps;Supine Quad Sets: AROM;Both;10 reps;Supine Heel Slides: AAROM;15 reps;Supine;Left Straight Leg Raises: AAROM;Left;10 reps;Supine    General Comments        Pertinent Vitals/Pain Pain Assessment: 0-10 Pain Score: 5  Pain Location: L knee Pain Descriptors / Indicators: Aching;Sore Pain Intervention(s): Limited activity within patient's tolerance;Monitored during session;Premedicated before session;Ice applied    Home Living                      Prior Function            PT Goals (current goals can now be found in the care plan section) Acute Rehab PT Goals Patient Stated Goal: Regain IND PT Goal Formulation: With patient Time For Goal Achievement: 06/07/19 Potential to Achieve Goals: Good Progress towards PT goals: Progressing toward goals    Frequency    7X/week      PT Plan Current plan remains appropriate  Co-evaluation              AM-PAC PT "6 Clicks" Mobility   Outcome Measure  Help needed turning from your back to your side while in a flat bed without using bedrails?: A Lot Help needed moving from lying on your back to sitting on the side of a flat bed without using bedrails?: A Little Help needed moving to and from a bed to a chair (including a wheelchair)?: A Little Help needed standing up from a chair using your arms (e.g., wheelchair or bedside chair)?: A Little Help needed to walk in hospital room?: A Little Help needed climbing 3-5 steps with a  railing? : A Lot 6 Click Score: 16    End of Session Equipment Utilized During Treatment: Gait belt;Left knee immobilizer Activity Tolerance: Patient limited by fatigue Patient left: in chair;with call bell/phone within reach;with nursing/sitter in room Nurse Communication: Mobility status PT Visit Diagnosis: Difficulty in walking, not elsewhere classified (R26.2)     Time: 7494-4967 PT Time Calculation (min) (ACUTE ONLY): 34 min  Charges:  $Gait Training: 8-22 mins $Therapeutic Exercise: 8-22 mins                     [place] Pager [phone] Office [phone]    Priya Matsen 06/01/2019, 1:29 PM

## 2019-06-01 NOTE — Discharge Instructions (Signed)

## 2019-06-01 NOTE — TOC Initial Note (Signed)
Transition of Care [facility]) - Initial/Assessment Note    Patient Details  Name: [name] MRN: [id number] Date of Birth: Oct 28, 1946  Transition of Care [facility]) CM/SW Contact:    Joaquin Courts, RN Phone Number: 06/01/2019, 2:06 PM  Clinical Narrative:    CM spoke with patient at bedside. Patient set up with [facility] for [place]. Patient reports she has rolling walker at home, declines 3-in-1.                Expected Discharge Plan: [place] Barriers to Discharge: Continued Medical Work up   Patient Goals and CMS Choice Patient states their goals for this hospitalization and ongoing recovery are:: to go home CMS Medicare.gov Compare Post Acute Care list provided to:: Patient Choice offered to / list presented to : Patient  Expected Discharge Plan and Services Expected Discharge Plan: [place]   Discharge Planning Services: CM Consult Post Acute Care Choice: [place] arrangements for the past 2 months: Single Family Home Expected Discharge Date: 06/02/19               DME Arranged: N/A DME Agency: NA       HH Arranged: PT HH Agency: [facility] (formerly [employer]) Date [place]: 06/01/19 Time [place]: 70 Representative spoke with at [place]: Pre arranged in MD office  Prior Living Arrangements/Services Living arrangements for the past 2 months: [place] with:: Self Patient language and need for interpreter reviewed:: Yes Do you feel safe going back to the place where you live?: Yes      Need for Family Participation in Patient Care: Yes (Comment) Care giver support system in place?: Yes (comment)   Criminal Activity/Legal Involvement Pertinent to Current Situation/Hospitalization: No - Comment as needed  Activities of Daily Living Home Assistive Devices/Equipment: Bedside commode/3-in-1, Walker (specify type), Eyeglasses ADL Screening (condition at  time of admission) Patient's cognitive ability adequate to safely complete daily activities?: Yes Is the patient deaf or have difficulty hearing?: Yes Does the patient have difficulty seeing, even when wearing glasses/contacts?: No Does the patient have difficulty concentrating, remembering, or making decisions?: No Patient able to express need for assistance with ADLs?: Yes Does the patient have difficulty dressing or bathing?: No Independently performs ADLs?: Yes (appropriate for developmental age) Does the patient have difficulty walking or climbing stairs?: Yes Weakness of Legs: Left Weakness of Arms/Hands: None  Permission Sought/Granted                  Emotional Assessment Appearance:: Appears stated age Attitude/Demeanor/Rapport: Engaged Affect (typically observed): Accepting Orientation: : Oriented to Place, Oriented to  Time, Oriented to Situation, Oriented to Self   Psych Involvement: No (comment)  Admission diagnosis:  endstage osteoarthritis left knee Patient Active Problem List   Diagnosis Date Noted  . Status post total left knee replacement 05/31/2019  . Unilateral primary osteoarthritis, right knee 04/23/2019  . Unilateral primary osteoarthritis, left knee 04/23/2019  . Obesity due to excess calories with serious comorbidity 07/18/2018  . Chronic insomnia 07/18/2018  . Anxiety associated with depression 07/18/2018  . Morbid obesity due to excess calories ([place]) 04/25/2016  . OSA (obstructive sleep apnea) 04/25/2016  . Paroxysmal atrial fibrillation ([place]) 04/25/2016  . Obstructive sleep apnea 06/04/2015  . Insomnia w/ sleep apnea 06/04/2015  . Hypoxemia 06/04/2015  . Encounter for therapeutic drug monitoring 02/10/2015  . Insomnia, controlled 01/01/2014  . Skin  lesion of breast 06/05/2013  . Urinary incontinence, mixed 06/05/2013  . Chronic anticoagulation 09/12/2012  . Atrial fibrillation ([place]) 01/10/2011  . Carotid stenosis 01/10/2011  . DYSPNEA  01/10/2011  . COLONIC POLYPS, ADENOMATOUS, HX OF 11/04/2010  . HEMORRHOIDS-INTERNAL 11/03/2010  . HIATAL HERNIA 11/03/2010  . DIVERTICULAR DISEASE 11/03/2010  . RECTAL BLEEDING 11/03/2010  . FATTY LIVER DISEASE, HX OF 11/03/2010  . HYPERCHOLESTEROLEMIA 08/10/2010  . Other fatigue 12/22/2009  . CHOLELITHIASIS 08/06/2008  . Essential hypertension 07/25/2008  . INSOMNIA, CHRONIC 05/21/2007  . RESTLESS LEG SYNDROME 05/21/2007  . SLEEP APNEA 05/21/2007   PCP:  Lucille Passy, MD Pharmacy:   [id number] [place], [place] [address] Phone: [phone] Fax: [phone]     Social Determinants of Health (SDOH) Interventions    Readmission Risk Interventions No flowsheet data found.

## 2019-06-01 NOTE — Progress Notes (Signed)
    Home health agencies that serve [phone].        [place] Quality of Patient Care Rating Patient Survey Summary Rating  [facility] [phone] 4 out of 5 stars 4 out of [place] [phone] 3 out of 5 stars 5 out of [place] [phone] 3 out of 5 stars 4 out of [place] [phone]) [phone] 4  out of 5 stars 3 out of [place] [phone] 4 out of 5 stars 4 out of 5 stars  [place] [phone] 4 out of 5 stars 4 out of [place] [phone] 4 out of 5 stars 4 out of 5 stars  [facility] [place] [phone] 3  out of 5 stars 4 out of [place] [phone] 3 out of 5 stars 4 out of [place] [phone] 3  out of 5 stars 3 out of [place] [phone] 3  out of 5 stars 4 out of [place] [phone] 3  out of 5 stars 3 out of [place] [phone] 4  out of 5 stars 3 out of [place] [phone] 4  out of 5 stars 2 out of [place] number Footnote as displayed on [place]  1 This agency provides services under a federal waiver program to non-traditional, chronic long term population.  2 This agency provides services to a special needs population.  3 Not Available.  4 The number of patient episodes for this measure is too small to report.  5 This measure currently does not have data or provider has been certified/recertified for less than 6 months.  6 The national average for this measure is not provided because of state-to-state differences in data collection.  7 Medicare is not displaying rates for this measure for any  home health agency, because of an issue with the data.  8 There were problems with the data and they are being corrected.  9 Zero, or very few, patients met the survey's rules for inclusion. The scores shown, if any, reflect a very small number of surveys and may not accurately tell how an agency is doing.  10 Survey results are based on less than 12 months of data.  11 Fewer than 70 patients completed the survey. Use the scores shown, if any, with caution as the number of surveys may be too low to accurately tell how an agency is doing.  12 No survey results are available for this period.  13 Data suppressed by CMS for one or more quarters.

## 2019-06-01 NOTE — Progress Notes (Signed)
Subjective: 1 Day Post-Op Procedure(s) (LRB): LEFT TOTAL KNEE ARTHROPLASTY (Left) Patient reports pain as moderate.  Some lightheadedness and nausea.  Objective: Vital signs in last 24 hours: Temp:  [97.3 F (36.3 C)-97.7 F (36.5 C)] 97.6 F (36.4 C) (07/25 1014) Pulse Rate:  [58-66] 58 (07/25 1014) Resp:  [16-18] 16 (07/25 1014) BP: (104-147)/(57-78) 107/59 (07/25 1014) SpO2:  [95 %-99 %] 99 % (07/25 1014)  Intake/Output from previous day: 07/24 0701 - 07/25 0700 In: 3188 [P.O.:220; I.V.:2618; IV Piggyback:350] Out: 2500 [Urine:2175; Emesis/NG output:300; Blood:25] Intake/Output this shift: Total I/O In: 60 [P.O.:60] Out: 200 [Urine:200]  Recent Labs    05/29/19 1212 06/01/19 0254  HGB 13.6 11.4*   Recent Labs    05/29/19 1212 06/01/19 0254  WBC 7.6 9.3  RBC 4.39 3.72*  HCT 41.3 35.1*  PLT 191 178   Recent Labs    05/29/19 1212 06/01/19 0254  NA 139 141  K 4.8 4.4  CL 107 109  CO2 23 26  BUN 26* 19  CREATININE 1.07* 0.89  GLUCOSE 89 139*  CALCIUM 9.0 8.3*   No results for input(s): LABPT, INR in the last 72 hours.  Sensation intact distally Intact pulses distally Dorsiflexion/Plantar flexion intact Incision: scant drainage No cellulitis present Compartment soft   Assessment/Plan: 1 Day Post-Op Procedure(s) (LRB): LEFT TOTAL KNEE ARTHROPLASTY (Left) D/C IV fluids Plan for discharge tomorrow Discharge home with home health      Mcarthur Rossetti 06/01/2019, 12:11 PM

## 2019-06-01 NOTE — Progress Notes (Signed)
Physical Therapy Treatment Patient Details Name: [name] MRN: [id number] DOB: 08-27-1946 Today's Date: 06/01/2019    History of Present Illness Pt s/p L TKR    PT Comments    Pt continues motivated and ambulated increased distance in hall this pm with no c/o dizziness but with onset of nausea.  Pt up to bathroom and performing hand hygiene at sink but with one episode LOB requiring assist to correct and prevent fall.   Follow Up Recommendations  Home health PT;Follow surgeon's recommendation for DC plan and follow-up therapies     Equipment Recommendations  None recommended by PT    Recommendations for Other Services       Precautions / Restrictions Precautions Precautions: Fall;Knee Required Braces or Orthoses: Knee Immobilizer - Left Knee Immobilizer - Left: Discontinue once straight leg raise with < 10 degree lag Restrictions Weight Bearing Restrictions: No Other Position/Activity Restrictions: WBAT    Mobility  Bed Mobility Overal bed mobility: Needs Assistance Bed Mobility: Sit to Supine     Supine to sit: Min assist Sit to supine: Min assist   General bed mobility comments: cues for sequence and physical assist to manage L LE and control trunk  Transfers Overall transfer level: Needs assistance Equipment used: Rolling walker (2 wheeled) Transfers: Sit to/from Stand Sit to Stand: Min assist         General transfer comment: cues for LE management and use of UEs to self assist  Ambulation/Gait Ambulation/Gait assistance: Min assist;Mod assist Gait Distance (Feet): 98 Feet(and 15' back from bathroom) Assistive device: Rolling walker (2 wheeled) Gait Pattern/deviations: Step-to pattern;Decreased step length - right;Decreased step length - left;Shuffle;Trunk flexed Gait velocity: decr   General Gait Details: cues for sequence, posture and position from RW; one episode of LOB while in bathroom requiring mod assist to stabilize   Stairs              Wheelchair Mobility    Modified Rankin (Stroke Patients Only)       Balance Overall balance assessment: Needs assistance Sitting-balance support: No upper extremity supported;Feet supported Sitting balance-Leahy Scale: Good     Standing balance support: Bilateral upper extremity supported Standing balance-Leahy Scale: Poor                              Cognition Arousal/Alertness: Awake/alert Behavior During Therapy: WFL for tasks assessed/performed Overall Cognitive Status: Within Functional Limits for tasks assessed                                        Exercises Total Joint Exercises Ankle Circles/Pumps: AROM;Both;15 reps;Supine Quad Sets: AROM;Both;10 reps;Supine Heel Slides: AAROM;15 reps;Supine;Left Straight Leg Raises: AAROM;Left;10 reps;Supine    General Comments        Pertinent Vitals/Pain Pain Assessment: 0-10 Pain Score: 5  Pain Location: L knee Pain Descriptors / Indicators: Aching;Sore Pain Intervention(s): Limited activity within patient's tolerance;Monitored during session;Patient requesting pain meds-RN notified;RN gave pain meds during session;Ice applied    Home Living                      Prior Function            PT Goals (current goals can now be found in the care plan section) Acute Rehab PT Goals Patient Stated Goal: Regain IND PT Goal Formulation: With patient  Time For Goal Achievement: 06/07/19 Potential to Achieve Goals: Good Progress towards PT goals: Progressing toward goals    Frequency    7X/week      PT Plan Current plan remains appropriate    Co-evaluation              AM-PAC PT "6 Clicks" Mobility   Outcome Measure  Help needed turning from your back to your side while in a flat bed without using bedrails?: A Lot Help needed moving from lying on your back to sitting on the side of a flat bed without using bedrails?: A Little Help needed moving to and from a  bed to a chair (including a wheelchair)?: A Little Help needed standing up from a chair using your arms (e.g., wheelchair or bedside chair)?: A Little Help needed to walk in hospital room?: A Little Help needed climbing 3-5 steps with a railing? : A Lot 6 Click Score: 16    End of Session Equipment Utilized During Treatment: Gait belt;Left knee immobilizer Activity Tolerance: Patient tolerated treatment well;Other (comment)(nausea) Patient left: in bed;with call bell/phone within reach;with nursing/sitter in room Nurse Communication: Mobility status PT Visit Diagnosis: Difficulty in walking, not elsewhere classified (R26.2)     Time: 6222-9798 PT Time Calculation (min) (ACUTE ONLY): 25 min  Charges:  $Gait Training: 8-22 mins $Therapeutic Exercise: 8-22 mins $Therapeutic Activity: 8-22 mins                     [place] Pager [phone] Office [phone]    Mckenzye Cutright 06/01/2019, 3:30 PM

## 2019-06-02 NOTE — Plan of Care (Signed)
  Problem: Education: Goal: Knowledge of the prescribed therapeutic regimen will improve 06/02/2019 1550 by Hubert Azure, RN Outcome: Progressing 06/02/2019 0836 by Hubert Azure, RN Outcome: Progressing Goal: Individualized Educational Video(s) 06/02/2019 1550 by Hubert Azure, RN Outcome: Progressing 06/02/2019 0836 by Hubert Azure, RN Outcome: Progressing   Problem: Activity: Goal: Ability to avoid complications of mobility impairment will improve 06/02/2019 1550 by Hubert Azure, RN Outcome: Progressing 06/02/2019 0836 by Hubert Azure, RN Outcome: Progressing Goal: Range of joint motion will improve 06/02/2019 1550 by Hubert Azure, RN Outcome: Progressing 06/02/2019 0836 by Hubert Azure, RN Outcome: Progressing   Problem: Clinical Measurements: Goal: Postoperative complications will be avoided or minimized 06/02/2019 1550 by Hubert Azure, RN Outcome: Progressing 06/02/2019 0836 by Hubert Azure, RN Outcome: Progressing   Problem: Pain Management: Goal: Pain level will decrease with appropriate interventions Outcome: Progressing

## 2019-06-02 NOTE — Progress Notes (Signed)
Physical Therapy Treatment Patient Details Name: [name] MRN: [id number] DOB: [birth year] Today's Date: 06/02/2019    History of Present Illness Pt s/p L TKR    PT Comments    Pt continues cooperative but limited this am by increased pain and fatigue.   Follow Up Recommendations  Home health PT;Follow surgeon's recommendation for DC plan and follow-up therapies     Equipment Recommendations  None recommended by PT    Recommendations for Other Services       Precautions / Restrictions Precautions Precautions: Fall;Knee Required Braces or Orthoses: Knee Immobilizer - Left Knee Immobilizer - Left: Discontinue once straight leg raise with < 10 degree lag Restrictions Weight Bearing Restrictions: No Other Position/Activity Restrictions: WBAT    Mobility  Bed Mobility Overal bed mobility: Needs Assistance Bed Mobility: Supine to Sit     Supine to sit: Min assist;Mod assist     General bed mobility comments: cues for sequence and physical assist to manage L LE and control trunk  Transfers Overall transfer level: Needs assistance Equipment used: Rolling walker (2 wheeled) Transfers: Sit to/from Stand Sit to Stand: Min assist;Mod assist         General transfer comment: cues for LE management and use of UEs to self assist  Ambulation/Gait Ambulation/Gait assistance: Min assist Gait Distance (Feet): 64 Feet Assistive device: Rolling walker (2 wheeled) Gait Pattern/deviations: Step-to pattern;Decreased step length - right;Decreased step length - left;Shuffle;Trunk flexed Gait velocity: decr   General Gait Details: cues for sequence, posture and position from RW; distance ltd by pain and fatigue   Stairs             Wheelchair Mobility    Modified Rankin (Stroke Patients Only)       Balance Overall balance assessment: Needs assistance Sitting-balance support: No upper extremity supported;Feet supported Sitting balance-Leahy Scale: Good      Standing balance support: Bilateral upper extremity supported Standing balance-Leahy Scale: Poor                              Cognition Arousal/Alertness: Awake/alert Behavior During Therapy: WFL for tasks assessed/performed Overall Cognitive Status: Within Functional Limits for tasks assessed                                        Exercises Total Joint Exercises Ankle Circles/Pumps: AROM;Both;15 reps;Supine Quad Sets: AROM;Both;10 reps;Supine Heel Slides: AAROM;15 reps;Supine;Left Straight Leg Raises: AAROM;Left;10 reps;Supine Goniometric ROM: AAROM L knee -8 - 40 - pain limited    General Comments        Pertinent Vitals/Pain Pain Assessment: 0-10 Pain Score: 7  Pain Location: L knee Pain Descriptors / Indicators: Aching;Sore Pain Intervention(s): Limited activity within patient's tolerance;Monitored during session;Premedicated before session;Ice applied    Home Living                      Prior Function            PT Goals (current goals can now be found in the care plan section) Acute Rehab PT Goals Patient Stated Goal: Regain IND PT Goal Formulation: With patient Time For Goal Achievement: 06/07/19 Potential to Achieve Goals: Good Progress towards PT goals: Not progressing toward goals - comment(ltd by pain/fatigue)    Frequency    7X/week      PT Plan Current plan  remains appropriate    Co-evaluation              AM-PAC PT "6 Clicks" Mobility   Outcome Measure  Help needed turning from your back to your side while in a flat bed without using bedrails?: A Lot Help needed moving from lying on your back to sitting on the side of a flat bed without using bedrails?: A Little Help needed moving to and from a bed to a chair (including a wheelchair)?: A Little Help needed standing up from a chair using your arms (e.g., wheelchair or bedside chair)?: A Little Help needed to walk in hospital room?: A  Little Help needed climbing 3-5 steps with a railing? : A Lot 6 Click Score: 16    End of Session Equipment Utilized During Treatment: Gait belt;Left knee immobilizer Activity Tolerance: Patient limited by fatigue;Patient limited by pain Patient left: in chair;with call bell/phone within reach;with chair alarm set Nurse Communication: Mobility status PT Visit Diagnosis: Difficulty in walking, not elsewhere classified (R26.2)     Time: 5697-9480 PT Time Calculation (min) (ACUTE ONLY): 26 min  Charges:  $Gait Training: 8-22 mins $Therapeutic Exercise: 8-22 mins                     [place] Pager [phone] Office [phone]    Ameris Akamine 06/02/2019, 12:03 PM

## 2019-06-02 NOTE — Progress Notes (Signed)
Physical Therapy Treatment Patient Details Name: [name] MRN: [id number] DOB: 1946/05/01 Today's Date: 06/02/2019    History of Present Illness Pt s/p L TKR    PT Comments    Pt feeling much better this pm and tolerated ambulating increased distance in hall as well as up to bathroom including performing hand hygiene standing at sink.   Follow Up Recommendations  Home health PT;Follow surgeon's recommendation for DC plan and follow-up therapies     Equipment Recommendations  None recommended by PT    Recommendations for Other Services       Precautions / Restrictions Precautions Precautions: Fall;Knee Required Braces or Orthoses: Knee Immobilizer - Left Knee Immobilizer - Left: Discontinue once straight leg raise with < 10 degree lag Restrictions Weight Bearing Restrictions: No Other Position/Activity Restrictions: WBAT    Mobility  Bed Mobility Overal bed mobility: Needs Assistance Bed Mobility: Supine to Sit;Sit to Supine     Supine to sit: Min assist Sit to supine: Min assist   General bed mobility comments: cues for sequence and physical assist to manage L LE   Transfers Overall transfer level: Needs assistance Equipment used: Rolling walker (2 wheeled) Transfers: Sit to/from Stand Sit to Stand: Min assist;Min guard         General transfer comment: cues for LE management and use of UEs to self assist  Ambulation/Gait Ambulation/Gait assistance: Min assist;Min guard Gait Distance (Feet): 100 Feet(and 15' to bathroom) Assistive device: Rolling walker (2 wheeled) Gait Pattern/deviations: Step-to pattern;Decreased step length - right;Decreased step length - left;Shuffle;Trunk flexed Gait velocity: decr   General Gait Details: cues for sequence, posture and position from RW;   Chief Strategy Officer    Modified Rankin (Stroke Patients Only)       Balance Overall balance assessment: Needs  assistance Sitting-balance support: No upper extremity supported;Feet supported Sitting balance-Leahy Scale: Good     Standing balance support: Bilateral upper extremity supported Standing balance-Leahy Scale: Fair                              Cognition Arousal/Alertness: Awake/alert Behavior During Therapy: WFL for tasks assessed/performed Overall Cognitive Status: Within Functional Limits for tasks assessed                                        Exercises Total Joint Exercises Ankle Circles/Pumps: AROM;Both;15 reps;Supine Quad Sets: AROM;Both;10 reps;Supine Heel Slides: AAROM;15 reps;Supine;Left Straight Leg Raises: AAROM;Left;10 reps;Supine Goniometric ROM: AAROM L knee -8 - 40 - pain limited    General Comments        Pertinent Vitals/Pain Pain Assessment: 0-10 Pain Score: 6  Pain Location: L knee Pain Descriptors / Indicators: Aching;Sore Pain Intervention(s): Limited activity within patient's tolerance;Monitored during session;Premedicated before session;Ice applied    Home Living                      Prior Function            PT Goals (current goals can now be found in the care plan section) Acute Rehab PT Goals Patient Stated Goal: Regain IND PT Goal Formulation: With patient Time For Goal Achievement: 06/07/19 Potential to Achieve Goals: Good Progress towards PT goals: Progressing toward goals    Frequency    7X/week  PT Plan Current plan remains appropriate    Co-evaluation              AM-PAC PT "6 Clicks" Mobility   Outcome Measure  Help needed turning from your back to your side while in a flat bed without using bedrails?: A Little Help needed moving from lying on your back to sitting on the side of a flat bed without using bedrails?: A Little Help needed moving to and from a bed to a chair (including a wheelchair)?: A Little Help needed standing up from a chair using your arms (e.g.,  wheelchair or bedside chair)?: A Little Help needed to walk in hospital room?: A Little Help needed climbing 3-5 steps with a railing? : A Lot 6 Click Score: 17    End of Session Equipment Utilized During Treatment: Gait belt;Left knee immobilizer Activity Tolerance: Patient tolerated treatment well Patient left: in bed;with call bell/phone within reach Nurse Communication: Mobility status PT Visit Diagnosis: Difficulty in walking, not elsewhere classified (R26.2)     Time: 3403-5248 PT Time Calculation (min) (ACUTE ONLY): 24 min  Charges:  $Gait Training: 8-22 mins $Therapeutic Exercise: 8-22 mins $Therapeutic Activity: 8-22 mins                     [place] Pager [phone] Office [phone]    Sharen Youngren 06/02/2019, 3:57 PM

## 2019-06-02 NOTE — Plan of Care (Signed)

## 2019-06-02 NOTE — Progress Notes (Signed)
   Subjective: 2 Days Post-Op Procedure(s) (LRB): LEFT TOTAL KNEE ARTHROPLASTY (Left) Patient reports pain as moderate and severe.    Objective: Vital signs in last 24 hours: Temp:  [97.6 F (36.4 C)-98.2 F (36.8 C)] 98.2 F (36.8 C) (07/26 0533) Pulse Rate:  [57-75] 61 (07/26 0533) Resp:  [16] 16 (07/26 0533) BP: (100-134)/(48-67) 100/48 (07/26 0533) SpO2:  [95 %-99 %] 95 % (07/26 0533)  Intake/Output from previous day: 07/25 0701 - 07/26 0700 In: 280 [P.O.:280] Out: 700 [Urine:700] Intake/Output this shift: No intake/output data recorded.  Recent Labs    06/01/19 0254  HGB 11.4*   Recent Labs    06/01/19 0254  WBC 9.3  RBC 3.72*  HCT 35.1*  PLT 178   Recent Labs    06/01/19 0254  NA 141  K 4.4  CL 109  CO2 26  BUN 19  CREATININE 0.89  GLUCOSE 139*  CALCIUM 8.3*   No results for input(s): LABPT, INR in the last 72 hours.  Neurologically intact No results found.  Assessment/Plan: 2 Days Post-Op Procedure(s) (LRB): LEFT TOTAL KNEE ARTHROPLASTY (Left) Up with therapy, discharge home after therapy  [name] 06/02/2019, 9:11 AM

## 2019-06-03 ENCOUNTER — Encounter (HOSPITAL_COMMUNITY): Payer: Self-pay | Admitting: Orthopaedic Surgery

## 2019-06-03 NOTE — Progress Notes (Signed)
Physical Therapy Treatment Patient Details Name: [name] MRN: [id number] DOB: 01/09/1946 Today's Date: 06/03/2019    History of Present Illness Pt s/p L TKR    PT Comments    POD # 3 pm session With daughter present, demonstrated and instructed on safe handling with transfers, amb and stairs.   Addressed all mobility questions, discussed appropriate activity, educated on use of ICE.  Pt ready for D/C to home.   Follow Up Recommendations  Home health PT;Follow surgeon's recommendation for DC plan and follow-up therapies     Equipment Recommendations  3in1 (PT)    Recommendations for Other Services       Precautions / Restrictions Precautions Precautions: Fall;Knee Precaution Comments: did not use KI this session Restrictions Weight Bearing Restrictions: No Other Position/Activity Restrictions: WBAT    Mobility  Bed Mobility               General bed mobility comments: OOB in recliner  Transfers Overall transfer level: Needs assistance Equipment used: Rolling walker (2 wheeled) Transfers: Sit to/from Stand Sit to Stand: Supervision;Min guard         General transfer comment: cues for LE management and use of UEs to self assist  Ambulation/Gait Ambulation/Gait assistance: Supervision;Min guard Gait Distance (Feet): 27 Feet Assistive device: Rolling walker (2 wheeled) Gait Pattern/deviations: Step-to pattern;Decreased step length - right;Decreased step length - left;Shuffle;Trunk flexed Gait velocity: decreased   General Gait Details: decreased distance due toincreased pain, cues for sequence, posture and position from RW;   Stairs Stairs: Yes Stairs assistance: Min assist Stair Management: No rails;Step to pattern;Forwards;With walker Number of Stairs: 2 General stair comments: 25% VC's on proper sequencing and safety.  Pt had difficulty because "other' knee was "bad".  Had daughter "hands on" assist    Wheelchair Mobility    Modified  Rankin (Stroke Patients Only)       Balance                                            Cognition Arousal/Alertness: Awake/alert Behavior During Therapy: WFL for tasks assessed/performed Overall Cognitive Status: Within Functional Limits for tasks assessed                                        Exercises      General Comments        Pertinent Vitals/Pain Pain Assessment: 0-10 Pain Score: 7  Pain Location: L knee Pain Descriptors / Indicators: Aching;Sore Pain Intervention(s): Monitored during session;Repositioned    Home Living                      Prior Function            PT Goals (current goals can now be found in the care plan section) Progress towards PT goals: Progressing toward goals    Frequency    7X/week      PT Plan Current plan remains appropriate    Co-evaluation              AM-PAC PT "6 Clicks" Mobility   Outcome Measure  Help needed turning from your back to your side while in a flat bed without using bedrails?: A Little Help needed moving from lying on your back to sitting on  the side of a flat bed without using bedrails?: A Little Help needed moving to and from a bed to a chair (including a wheelchair)?: A Little Help needed standing up from a chair using your arms (e.g., wheelchair or bedside chair)?: A Little Help needed to walk in hospital room?: A Little Help needed climbing 3-5 steps with a railing? : A Lot 6 Click Score: 17    End of Session Equipment Utilized During Treatment: Gait belt Activity Tolerance: Patient tolerated treatment well Patient left: in chair;with call bell/phone within reach;with chair alarm set Nurse Communication: Mobility status PT Visit Diagnosis: Difficulty in walking, not elsewhere classified (R26.2)     Time: 2841-3244 PT Time Calculation (min) (ACUTE ONLY): 15 min  Charges:  $Gait Training: 8-22 mins                    Rica Koyanagi   PTA Acute  Rehabilitation Services Pager      [phone] Office      [phone]

## 2019-06-03 NOTE — Care Management Important Message (Signed)
Important Message  Patient Details IM Letter given to Velva Harman RN to present to the Patient Name: NOREEN MACKINTOSH MRN: [id number] Date of Birth: [birth year]   Medicare Important Message Given:  Yes     Kerin Salen 06/03/2019, 11:49 AM

## 2019-06-03 NOTE — Discharge Summary (Signed)
Patient ID: [name] MRN: [id number] DOB/AGE: 1946-06-29 73 y.o.  Admit date: 05/31/2019 Discharge date: 06/03/2019  Admission Diagnoses:  Principal Problem:   Unilateral primary osteoarthritis, left knee Active Problems:   Status post total left knee replacement   Discharge Diagnoses:  Same  Past Medical History:  Diagnosis Date  . Acute upper respiratory infections of unspecified site   . Arthritis   . Carotid bruit   . Cataracts, both eyes   . Chronic insomnia   . Colon polyps   . Diverticulosis    pt denies  . Dysrhythmia    a fib  . Fatty liver   . Hiatal hernia   . History of gallstones   . Hx of adenomatous colonic polyps   . Hyperlipidemia   . Hypertension   . Insomnia, controlled 01/01/2014  . Internal hemorrhoids   . Macular pucker, left eye    surgery planned 01-14-15  . Menopause    noticed in early 58's  . Other acute sinusitis   . Other malaise and fatigue   . Paroxysmal atrial fibrillation (HCC)    on meds  . Pneumonia    as a small child  . PONV (postoperative nausea and vomiting)   . Pure hypercholesterolemia   . Restless leg syndrome   . Routine general medical examination at a health care facility   . Screening for lipoid disorders   . Sleep apnea    no c-pap  . Special screening for malignant neoplasms, colon     Surgeries: Procedure(s): LEFT TOTAL KNEE ARTHROPLASTY on 05/31/2019   Consultants:   Discharged Condition: Improved  Hospital Course: [name] is an 73 y.o. female who was admitted 05/31/2019 for operative treatment ofUnilateral primary osteoarthritis, left knee. Patient has severe unremitting pain that affects sleep, daily activities, and work/hobbies. After pre-op clearance the patient was taken to the operating room on 05/31/2019 and underwent  Procedure(s): LEFT TOTAL KNEE ARTHROPLASTY.    Patient was given perioperative antibiotics:  Anti-infectives (From admission, onward)   Start     Dose/Rate Route  Frequency Ordered Stop   05/31/19 1500  ceFAZolin (ANCEF) IVPB 1 g/50 mL premix     1 g 100 mL/hr over 30 Minutes Intravenous Every 6 hours 05/31/19 1238 05/31/19 2037   05/31/19 0645  ceFAZolin (ANCEF) IVPB 2g/100 mL premix     2 g 200 mL/hr over 30 Minutes Intravenous On call to O.R. 05/31/19 5102 05/31/19 0911       Patient was given sequential compression devices, early ambulation, and chemoprophylaxis to prevent DVT.  Patient benefited maximally from hospital stay and there were no complications.    Recent vital signs:  Patient Vitals for the past 24 hrs:  BP Temp Temp src Pulse Resp SpO2  06/03/19 0529 (!) 101/53 98.8 F (37.1 C) Oral 64 14 92 %  06/02/19 2032 (!) 149/67 98.3 F (36.8 C) Oral 75 16 96 %  06/02/19 1422 (!) 133/56 98.7 F (37.1 C) Oral 71 16 99 %  06/02/19 0929 (!) 100/58 - - 67 - 95 %     Recent laboratory studies:  Recent Labs    06/01/19 0254  WBC 9.3  HGB 11.4*  HCT 35.1*  PLT 178  NA 141  K 4.4  CL 109  CO2 26  BUN 19  CREATININE 0.89  GLUCOSE 139*  CALCIUM 8.3*     Discharge Medications:   Allergies as of 06/03/2019      Reactions   Codeine  Nausea And Vomiting      Medication List    TAKE these medications   celecoxib 100 MG capsule Commonly known as: CELEBREX TAKE 1 CAPSULE BY MOUTH TWICE A DAY   Eliquis 5 MG Tabs tablet Generic drug: apixaban TAKE 1 TABLET BY MOUTH TWICE A DAY What changed: how much to take   hydroxypropyl methylcellulose / hypromellose 2.5 % ophthalmic solution Commonly known as: ISOPTO TEARS / GONIOVISC Place 1 drop into both eyes 3 (three) times daily as needed for dry eyes.   losartan 100 MG tablet Commonly known as: COZAAR Take 1 tablet (100 mg total) by mouth daily.   methocarbamol 500 MG tablet Commonly known as: ROBAXIN Take 1 tablet (500 mg total) by mouth every 6 (six) hours as needed for muscle spasms.   metoprolol succinate 50 MG 24 hr tablet Commonly known as: TOPROL-XL TAKE 1 TABLET  BY MOUTH EVERY DAY   ondansetron 4 MG disintegrating tablet Commonly known as: Zofran ODT Take 1 tablet (4 mg total) by mouth every 8 (eight) hours as needed for nausea or vomiting.   oxyCODONE 5 MG immediate release tablet Commonly known as: Oxy IR/ROXICODONE Take 1-2 tablets (5-10 mg total) by mouth every 6 (six) hours as needed for moderate pain (pain score 4-6).   propafenone 300 MG tablet Commonly known as: RYTHMOL Take 1 tablet (300 mg total) by mouth 2 (two) times daily.   traMADol 50 MG tablet Commonly known as: ULTRAM Take 1-2 tablets (50-100 mg total) by mouth every 6 (six) hours as needed. What changed:   how much to take  reasons to take this   zolpidem 10 MG tablet Commonly known as: AMBIEN TAKE ONE-HALF TABLET BY MOUTH AT BEDTIME AS NEEDED What changed:   how much to take  how to take this  when to take this            Durable Medical Equipment  (From admission, onward)         Start     Ordered   05/31/19 1238  DME 3 n 1  Once     05/31/19 1238   05/31/19 1238  DME Walker rolling  Once    Question:  Patient needs a walker to treat with the following condition  Answer:  Status post total left knee replacement   05/31/19 1238          Diagnostic Studies: Dg Knee Left Port  Result Date: 05/31/2019 CLINICAL DATA:  LEFT total knee arthroplasty EXAM: PORTABLE LEFT KNEE - 1-2 VIEW COMPARISON:  04/23/2019 FINDINGS: LEFT total knee arthroplasty changes noted. No acute fracture or dislocation. No definite complicating features are noted. IMPRESSION: LEFT total knee arthroplasty changes without definite complicating features. Electronically Signed   By: Margarette Canada M.D.   On: 05/31/2019 10:59    Disposition: Discharge disposition: 01-Home or Self Care         Follow-up Information    Mcarthur Rossetti, MD Follow up in 2 week(s).   Specialty: Orthopedic Surgery Contact information: [place]  [id number] [phone]        [facility] Follow up.   Specialty: Home Health Services Why: agency will provide home health physical therapy. agency will call you to schedule first visit Contact information: [place] [address] [phone]            Signed: Mcarthur Rossetti 06/03/2019, 7:27 AM

## 2019-06-03 NOTE — Progress Notes (Signed)
Patient ID: [name], female   DOB: 1946/05/04, 73 y.o.   MRN: [id number] Better afternoon session with therapy yesterday.  Feels ok this am.  Vitals stable.  Left knee stable.  Can be discharged to home today.

## 2019-06-03 NOTE — Progress Notes (Signed)
Physical Therapy Treatment Patient Details Name: [name] MRN: [id number] DOB: 05/29/1946 Today's Date: 06/03/2019    History of Present Illness Pt s/p L TKR    PT Comments    POD # 3 am session Pt was OOB in recliner with B LE down.  Assisted with amb to bathroom.  Assisted with transfers and safety with turns.  Assisted with amb a decreased distance due to increased c/o pain.  Practiced stairs.  General stair comments: 25% VC's on proper sequencing and safety.  Pt had difficulty because "other' knee was "bad".  Will need to repeat stairs again with family for education.   Follow Up Recommendations  Home health PT;Follow surgeon's recommendation for DC plan and follow-up therapies     Equipment Recommendations  3in1 (PT)    Recommendations for Other Services       Precautions / Restrictions Precautions Precautions: Fall;Knee Precaution Comments: did not use KI this session Restrictions Weight Bearing Restrictions: No Other Position/Activity Restrictions: WBAT    Mobility  Bed Mobility               General bed mobility comments: OOB in recliner  Transfers Overall transfer level: Needs assistance Equipment used: Rolling walker (2 wheeled) Transfers: Sit to/from Stand Sit to Stand: Supervision;Min guard         General transfer comment: cues for LE management and use of UEs to self assist  Ambulation/Gait Ambulation/Gait assistance: Supervision;Min guard Gait Distance (Feet): 27 Feet Assistive device: Rolling walker (2 wheeled) Gait Pattern/deviations: Step-to pattern;Decreased step length - right;Decreased step length - left;Shuffle;Trunk flexed Gait velocity: decreased   General Gait Details: decreased distance due toincreased pain, cues for sequence, posture and position from RW;   Stairs Stairs: Yes Stairs assistance: Min assist Stair Management: No rails;Step to pattern;Forwards;With walker Number of Stairs: 2 General stair comments:  25% VC's on proper sequencing and safety.  Pt had difficulty because "other' knee was "bad".  Will need to repeat stairs again with family for education.   Wheelchair Mobility    Modified Rankin (Stroke Patients Only)       Balance                                            Cognition Arousal/Alertness: Awake/alert Behavior During Therapy: WFL for tasks assessed/performed Overall Cognitive Status: Within Functional Limits for tasks assessed                                        Exercises      General Comments        Pertinent Vitals/Pain Pain Assessment: 0-10 Pain Score: 7  Pain Location: L knee Pain Descriptors / Indicators: Aching;Sore Pain Intervention(s): Monitored during session;Repositioned    Home Living                      Prior Function            PT Goals (current goals can now be found in the care plan section) Progress towards PT goals: Progressing toward goals    Frequency    7X/week      PT Plan Current plan remains appropriate    Co-evaluation              AM-PAC PT "  6 Clicks" Mobility   Outcome Measure  Help needed turning from your back to your side while in a flat bed without using bedrails?: A Little Help needed moving from lying on your back to sitting on the side of a flat bed without using bedrails?: A Little Help needed moving to and from a bed to a chair (including a wheelchair)?: A Little Help needed standing up from a chair using your arms (e.g., wheelchair or bedside chair)?: A Little Help needed to walk in hospital room?: A Little Help needed climbing 3-5 steps with a railing? : A Lot 6 Click Score: 17    End of Session Equipment Utilized During Treatment: Gait belt Activity Tolerance: Patient tolerated treatment well Patient left: in chair;with call bell/phone within reach;with chair alarm set Nurse Communication: Mobility status PT Visit Diagnosis: Difficulty in  walking, not elsewhere classified (R26.2)     Time: 1610-9604 PT Time Calculation (min) (ACUTE ONLY): 25 min  Charges:  $Gait Training: 8-22 mins $Therapeutic Activity: 8-22 mins                     Rica Koyanagi  PTA Acute  Rehabilitation Services Pager      [phone] Office      [phone]

## 2019-06-03 NOTE — Care Plan (Signed)
RNCM met with patient while in hospital to check status prior to being discharged home today. She was sitting up in her chair in her room and reports she was feeling good at this time. Discussed her f/u with MD scheduled as well as HHPT. Reminded someone would be calling her to schedule her initial evaluation at home. Patient verbalized she has requested a BSC for home use that will be delivered today to her room as arranged over the weekend by hospital staff. TOM/THN information sheet provided to patient for reference and requested to contact RNCM with any questions or needs. Jamse Arn, RN, BSN, [place] [phone].

## 2019-06-05 ENCOUNTER — Telehealth: Payer: Self-pay | Admitting: *Deleted

## 2019-06-05 DIAGNOSIS — I480 Paroxysmal atrial fibrillation: Secondary | ICD-10-CM | POA: Diagnosis not present

## 2019-06-05 DIAGNOSIS — I10 Essential (primary) hypertension: Secondary | ICD-10-CM | POA: Diagnosis not present

## 2019-06-05 DIAGNOSIS — G2581 Restless legs syndrome: Secondary | ICD-10-CM | POA: Diagnosis not present

## 2019-06-05 DIAGNOSIS — Z96652 Presence of left artificial knee joint: Secondary | ICD-10-CM | POA: Diagnosis not present

## 2019-06-05 DIAGNOSIS — K449 Diaphragmatic hernia without obstruction or gangrene: Secondary | ICD-10-CM | POA: Diagnosis not present

## 2019-06-05 DIAGNOSIS — F419 Anxiety disorder, unspecified: Secondary | ICD-10-CM | POA: Diagnosis not present

## 2019-06-05 DIAGNOSIS — Z86010 Personal history of colonic polyps: Secondary | ICD-10-CM | POA: Diagnosis not present

## 2019-06-05 DIAGNOSIS — Z6832 Body mass index (BMI) 32.0-32.9, adult: Secondary | ICD-10-CM | POA: Diagnosis not present

## 2019-06-05 DIAGNOSIS — N3946 Mixed incontinence: Secondary | ICD-10-CM | POA: Diagnosis not present

## 2019-06-05 DIAGNOSIS — Z7901 Long term (current) use of anticoagulants: Secondary | ICD-10-CM | POA: Diagnosis not present

## 2019-06-05 DIAGNOSIS — G4700 Insomnia, unspecified: Secondary | ICD-10-CM | POA: Diagnosis not present

## 2019-06-05 DIAGNOSIS — Z471 Aftercare following joint replacement surgery: Secondary | ICD-10-CM | POA: Diagnosis not present

## 2019-06-05 DIAGNOSIS — F418 Other specified anxiety disorders: Secondary | ICD-10-CM | POA: Diagnosis not present

## 2019-06-05 DIAGNOSIS — G4733 Obstructive sleep apnea (adult) (pediatric): Secondary | ICD-10-CM | POA: Diagnosis not present

## 2019-06-05 DIAGNOSIS — Z9181 History of falling: Secondary | ICD-10-CM | POA: Diagnosis not present

## 2019-06-05 NOTE — Care Plan (Signed)
RNCM met with patient at [facility] prior to discharge on Monday, 06/03/2019. She verbalized she was feeling good. Attempted discharge call today and left Voice message to check status once home. Requested call back to [facility].

## 2019-06-05 NOTE — Telephone Encounter (Signed)
Ortho bundle call attempted. Left VM.

## 2019-06-06 ENCOUNTER — Telehealth: Payer: Self-pay | Admitting: *Deleted

## 2019-06-06 NOTE — Telephone Encounter (Signed)
Called patient to check status after discharge. She states she has a rash over her back with hives that is very itchy. What do you recommend? [phone].

## 2019-06-07 ENCOUNTER — Telehealth: Payer: Self-pay | Admitting: *Deleted

## 2019-06-07 DIAGNOSIS — F419 Anxiety disorder, unspecified: Secondary | ICD-10-CM | POA: Diagnosis not present

## 2019-06-07 DIAGNOSIS — G4700 Insomnia, unspecified: Secondary | ICD-10-CM | POA: Diagnosis not present

## 2019-06-07 DIAGNOSIS — Z471 Aftercare following joint replacement surgery: Secondary | ICD-10-CM | POA: Diagnosis not present

## 2019-06-07 DIAGNOSIS — I480 Paroxysmal atrial fibrillation: Secondary | ICD-10-CM | POA: Diagnosis not present

## 2019-06-07 DIAGNOSIS — N3946 Mixed incontinence: Secondary | ICD-10-CM | POA: Diagnosis not present

## 2019-06-07 DIAGNOSIS — I10 Essential (primary) hypertension: Secondary | ICD-10-CM | POA: Diagnosis not present

## 2019-06-07 MED ORDER — OXYCODONE HCL 5 MG PO TABS: 5.0000 mg | ORAL_TABLET | Freq: Four times a day (QID) | ORAL | 0 refills | Status: DC | PRN

## 2019-06-07 MED FILL — Sodium Chloride Flush IV Soln 0.9%: INTRAVENOUS | Qty: 10 | Status: CN

## 2019-06-07 MED FILL — Sodium Chloride Flush IV Soln 0.9%: INTRAVENOUS | Qty: 10 | Status: AC

## 2019-06-07 NOTE — Telephone Encounter (Signed)
I sent in some more pain meds.  Also, she can try hydrocortisone cream for her rash as well as benadryl.

## 2019-06-07 NOTE — Telephone Encounter (Signed)
Patient's daughter called and said patient will be out of pain medication over the weekend. Wanted to see if they could get a refill today. Also stated she has had to take 2 tablets at a time for up until today and has cut back to 1 at a time. Also, see previous message about itchy rash on back with hives. Any recommendations? OTC creams?

## 2019-06-07 NOTE — Telephone Encounter (Signed)
Contacted daughter and made aware that pain medication has been refilled.

## 2019-06-10 ENCOUNTER — Other Ambulatory Visit: Payer: Self-pay | Admitting: *Deleted

## 2019-06-10 NOTE — Patient Outreach (Signed)
[facility]) Care Management  06/10/2019  [name] 01-16-1946 [id number]   Subjective: Telephone call to patient's home number, no answer, left HIPAA compliant voicemail message, and requested call back.   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   Patient also has a history of Carotid bruit, Chronic insomnia, Colon polyps, Dysrhythmia (Atrial Fibrillation), fatty liver, hypertension, Hiatal hernia, Hyperlipidemia, Pure hypercholesterolemia, and Sleep apnea.      Assessment: Received Medicare EMMI General Discharge Red Flag Alert follow up referral on 06/10/2019.  Red Flag Alert Triggers, Day # 1, times 2, patient answered no to the following question: Read discharge papers?   Patient answered I don't know to the following question: New prescriptions?   [facility] EMMI follow up pending patient contact.      Plan: RNCM will send unsuccessful outreach letter, [facility] pamphlet, handout: Know Before You Go, will call patient for 2nd telephone outreach attempt within 4 business days, [facility] EMMI follow up, and proceed with case closure, within 10 business days if no return call.      Joylyn Duggin H. Annia Friendly, BSN, [place] Management [facility] Telephonic CM Phone: [phone] Fax: [phone]

## 2019-06-11 ENCOUNTER — Other Ambulatory Visit: Payer: Self-pay | Admitting: *Deleted

## 2019-06-11 DIAGNOSIS — N3946 Mixed incontinence: Secondary | ICD-10-CM | POA: Diagnosis not present

## 2019-06-11 DIAGNOSIS — I10 Essential (primary) hypertension: Secondary | ICD-10-CM | POA: Diagnosis not present

## 2019-06-11 DIAGNOSIS — G4700 Insomnia, unspecified: Secondary | ICD-10-CM | POA: Diagnosis not present

## 2019-06-11 DIAGNOSIS — F419 Anxiety disorder, unspecified: Secondary | ICD-10-CM | POA: Diagnosis not present

## 2019-06-11 DIAGNOSIS — Z471 Aftercare following joint replacement surgery: Secondary | ICD-10-CM | POA: Diagnosis not present

## 2019-06-11 DIAGNOSIS — I480 Paroxysmal atrial fibrillation: Secondary | ICD-10-CM | POA: Diagnosis not present

## 2019-06-11 NOTE — Patient Outreach (Signed)
[facility]) Care Management  06/11/2019  ZNIYA COTTONE 1946-06-26 [id number]   Subjective: Telephone call to patient's home number times 2,  line busy, and unable to leave a message.    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   Patient also has a history of Carotid bruit, Chronic insomnia, Colon polyps, Dysrhythmia (Atrial Fibrillation), fatty liver, hypertension, Hiatal hernia, Hyperlipidemia, Pure hypercholesterolemia, and Sleep apnea.      Assessment: Received Medicare EMMI General Discharge Red Flag Alert follow up referral on 06/10/2019.  Red Flag Alert Triggers, Day # 1, times 2, patient answered no to the following question: Read discharge papers?   Patient answered I don't know to the following question: New prescriptions?   [facility] EMMI follow up pending patient contact.      Plan: RNCM has sent unsuccessful outreach letter, [facility] pamphlet, handout: Know Before You Go, will call patient for 3rd telephone outreach attempt within 4 business days, [facility] EMMI follow up, and proceed with case closure, within 10 business days if no return call.     Emmalin Jaquess H. Annia Friendly, BSN, [place] Management [facility] Telephonic CM Phone: [phone] Fax: [phone]

## 2019-06-12 ENCOUNTER — Other Ambulatory Visit: Payer: Self-pay | Admitting: *Deleted

## 2019-06-12 ENCOUNTER — Telehealth: Payer: Self-pay | Admitting: *Deleted

## 2019-06-12 NOTE — Patient Outreach (Signed)
[facility]) Care Management  06/12/2019  [name] 01-Mar-1946 [id number]   Subjective: Telephone call to patient's home number, spoke with patient, and HIPAA verified.  Discussed [facility] Care Management Medicare EMMI General Discharge Red Flag Alert follow up, patient voiced understanding, and is in agreement to follow up.  Patient states she is doing well, does not recall receiving EMMI automated calls, and daughter may have received the calls on her behalf.  States she has read her discharge instructions, does not have questions regarding instructions, does know who to call regarding changes, and has filled all of her new prescriptions.  Patient states she is aware of signs/ symptoms to report, how to reach provider if needed after hours, when to go to ED, and / or call 911. Patient states she is able to manage self care and has assistance as needed.  Patient states she does not have any education material, EMMI follow up, care coordination, care management, disease monitoring, transportation, community resource, or pharmacy needs at this time. States she is very appreciative of the follow up and is in agreement to receive [place] Management EMMI follow up calls if needed.    Objective:Per KPN (Knowledge Performance Now, point of care tool) and chart review,patient hospitalized 05/31/2019 - 06/03/2019 forUnilateral primary osteoarthritis, left knee,Status post total left knee replacementon 05/31/2019. Patient also has a history of Carotid bruit,Chronic insomnia,Colon polyps,Dysrhythmia(Atrial Fibrillation), fatty liver, hypertension,Hiatal hernia,Hyperlipidemia,Pure hypercholesterolemia, andSleep apnea.     Assessment:Received Medicare EMMI General Discharge Red Flag Alert follow up referral on 06/10/2019. Red Flag Alert Triggers, Day # 1, times 2, patient answered no to the following question: Read discharge papers?Patient answered I don't know to the  following question:New prescriptions?EMMI follow up completed and no further care management needs.     Plan:RNCM will complete case closure due to follow up completed / no care management needs.       Marlet Korte H. Annia Friendly, BSN, [place] Management [facility] Telephonic CM Phone: [phone] Fax: [phone]

## 2019-06-12 NOTE — Telephone Encounter (Signed)
1 week Ortho bundle call completed. 

## 2019-06-12 NOTE — Care Plan (Signed)
RNCM spoke with patient's daughter for 1 week post-discharge phone call. Daughter reports she is doing well today. Working with therapy and also now walking around home with a cane instead of FWW. States she has decreased pain medication, but she feels this is making her nauseous. Taking Tylenol today, but also notes she is out of Zofran. Discussed upcoming appointment on Monday, 06/17/19 with Dr. Ninfa Linden. Daughter states she is having a hard time hearing all instructions on the phone and in office and requested to be present for the appointment. RNCM will make note and inform check in staff for the appointment. Reminded of how to reach [facility] if needed.

## 2019-06-13 ENCOUNTER — Other Ambulatory Visit: Payer: Self-pay | Admitting: *Deleted

## 2019-06-13 ENCOUNTER — Other Ambulatory Visit: Payer: Self-pay | Admitting: Physician Assistant

## 2019-06-13 ENCOUNTER — Telehealth: Payer: Self-pay | Admitting: Orthopaedic Surgery

## 2019-06-13 DIAGNOSIS — I480 Paroxysmal atrial fibrillation: Secondary | ICD-10-CM | POA: Diagnosis not present

## 2019-06-13 DIAGNOSIS — Z471 Aftercare following joint replacement surgery: Secondary | ICD-10-CM | POA: Diagnosis not present

## 2019-06-13 DIAGNOSIS — F419 Anxiety disorder, unspecified: Secondary | ICD-10-CM | POA: Diagnosis not present

## 2019-06-13 DIAGNOSIS — N3946 Mixed incontinence: Secondary | ICD-10-CM | POA: Diagnosis not present

## 2019-06-13 DIAGNOSIS — I10 Essential (primary) hypertension: Secondary | ICD-10-CM | POA: Diagnosis not present

## 2019-06-13 DIAGNOSIS — G4700 Insomnia, unspecified: Secondary | ICD-10-CM | POA: Diagnosis not present

## 2019-06-13 MED ORDER — ONDANSETRON 4 MG PO TBDP: 4.0000 mg | ORAL_TABLET | Freq: Three times a day (TID) | ORAL | 0 refills | Status: DC | PRN

## 2019-06-13 NOTE — Telephone Encounter (Signed)
Spoke with patient's daughter yesterday afternoon late and she has requested a refill of Zofran for her mother. Pain medication (When she uses) is still making her nauseated.

## 2019-06-13 NOTE — Telephone Encounter (Signed)
Received a call from [facility] stating an order for therapy was to be sent over. Fax [phone], callback [phone]

## 2019-06-14 DIAGNOSIS — I480 Paroxysmal atrial fibrillation: Secondary | ICD-10-CM | POA: Diagnosis not present

## 2019-06-14 DIAGNOSIS — I10 Essential (primary) hypertension: Secondary | ICD-10-CM | POA: Diagnosis not present

## 2019-06-14 DIAGNOSIS — F419 Anxiety disorder, unspecified: Secondary | ICD-10-CM | POA: Diagnosis not present

## 2019-06-14 DIAGNOSIS — N3946 Mixed incontinence: Secondary | ICD-10-CM | POA: Diagnosis not present

## 2019-06-14 DIAGNOSIS — Z471 Aftercare following joint replacement surgery: Secondary | ICD-10-CM | POA: Diagnosis not present

## 2019-06-14 DIAGNOSIS — G4700 Insomnia, unspecified: Secondary | ICD-10-CM | POA: Diagnosis not present

## 2019-06-14 NOTE — Telephone Encounter (Signed)
I saw that this was a bundle patient. Can you please help with this?

## 2019-06-17 ENCOUNTER — Other Ambulatory Visit: Payer: Self-pay | Admitting: Physician Assistant

## 2019-06-17 ENCOUNTER — Encounter: Payer: Self-pay | Admitting: Physician Assistant

## 2019-06-17 ENCOUNTER — Ambulatory Visit (INDEPENDENT_AMBULATORY_CARE_PROVIDER_SITE_OTHER): Payer: Medicare Other | Admitting: Physician Assistant

## 2019-06-17 ENCOUNTER — Telehealth: Payer: Self-pay | Admitting: *Deleted

## 2019-06-17 VITALS — Ht 63.0 in | Wt 188.0 lb

## 2019-06-17 DIAGNOSIS — Z96652 Presence of left artificial knee joint: Secondary | ICD-10-CM

## 2019-06-17 MED ORDER — METHOCARBAMOL 500 MG PO TABS: 500.0000 mg | ORAL_TABLET | Freq: Four times a day (QID) | ORAL | 1 refills | Status: DC | PRN

## 2019-06-17 NOTE — Progress Notes (Signed)
HPI: [name] returns today 2 weeks status post left total knee arthroplasty.  She feels she is overall doing well.  Taking tramadol for pain.  She is ambulating with a cane.  She is had no fevers, chest pain or shortness of breath.  Physical exam: General: No acute distress. Left knee full extension flexion to 90 degrees.  Surgical incision is well approximated with staples no signs of wound dehiscence or infection.  Left calf supple nontender.  Dorsiflexion plantar flexion ankle intact.  Impression: Status post left total knee arthroplasty 05/31/2019  Plan: Staples removed Steri-Strips applied she work on scar tissue mobilization.  She is given a prescription for outpatient physical therapy which she is to begin tomorrow.  She will follow-up with [place] in 1 month sooner if there is any questions concerns.  Refill on Robaxin was  given.

## 2019-06-17 NOTE — Telephone Encounter (Signed)
Please advise 

## 2019-06-17 NOTE — Telephone Encounter (Signed)
14 day Ortho Bundle call (in office) completed.

## 2019-06-17 NOTE — Care Plan (Signed)
RNCM met with patient and her daughter in office today for her 2 week post-op appointment with Benita Stabile, PA-C. She is ambulating with a cane outside of her home and using no DME around the house. She verbalized she is taking Tramadol for pain as needed and using Oxycodone very sparingly due to nausea. She has good flexion and extension and has been discharged from [facility] She will start Outpatient PT with [facility] tomorrow, 06/18/2019. She was given an order and will take this tomorrow. Staples removed. Incision looks very good. Some bruising noted on bottom of Left foot with small "Pea-like" small area that moves easily. PA recommended massaging this as well as her knee scar to help with breaking up any scar tissue. Refill of Robaxin e-scribed into her pharmacy. Follow up in 4 weeks. Overall, patient doing extremely well at 2 weeks post-op.

## 2019-06-18 ENCOUNTER — Telehealth: Payer: Self-pay | Admitting: *Deleted

## 2019-06-18 ENCOUNTER — Other Ambulatory Visit: Payer: Self-pay | Admitting: Physician Assistant

## 2019-06-18 DIAGNOSIS — M25562 Pain in left knee: Secondary | ICD-10-CM | POA: Diagnosis not present

## 2019-06-18 DIAGNOSIS — R262 Difficulty in walking, not elsewhere classified: Secondary | ICD-10-CM | POA: Diagnosis not present

## 2019-06-18 DIAGNOSIS — M6281 Muscle weakness (generalized): Secondary | ICD-10-CM | POA: Diagnosis not present

## 2019-06-18 NOTE — Telephone Encounter (Signed)
Please advise 

## 2019-06-18 NOTE — Telephone Encounter (Signed)
Patient called and said her pharmacy received refill of Flexeril yesterday after her appointment, but she has only been on Robaxin. Requests refill of this instead.

## 2019-06-18 NOTE — Telephone Encounter (Signed)
Was this not filled yesterday

## 2019-06-18 NOTE — Telephone Encounter (Signed)
Was this not filled yesterday?

## 2019-06-19 ENCOUNTER — Other Ambulatory Visit: Payer: Self-pay

## 2019-06-19 MED ORDER — METHOCARBAMOL 500 MG PO TABS: 500.0000 mg | ORAL_TABLET | Freq: Four times a day (QID) | ORAL | 1 refills | Status: DC

## 2019-06-24 DIAGNOSIS — R262 Difficulty in walking, not elsewhere classified: Secondary | ICD-10-CM | POA: Diagnosis not present

## 2019-06-24 DIAGNOSIS — M6281 Muscle weakness (generalized): Secondary | ICD-10-CM | POA: Diagnosis not present

## 2019-06-24 DIAGNOSIS — M25562 Pain in left knee: Secondary | ICD-10-CM | POA: Diagnosis not present

## 2019-06-26 DIAGNOSIS — M6281 Muscle weakness (generalized): Secondary | ICD-10-CM | POA: Diagnosis not present

## 2019-06-26 DIAGNOSIS — R262 Difficulty in walking, not elsewhere classified: Secondary | ICD-10-CM | POA: Diagnosis not present

## 2019-06-26 DIAGNOSIS — M25562 Pain in left knee: Secondary | ICD-10-CM | POA: Diagnosis not present

## 2019-07-01 DIAGNOSIS — M25562 Pain in left knee: Secondary | ICD-10-CM | POA: Diagnosis not present

## 2019-07-01 DIAGNOSIS — M6281 Muscle weakness (generalized): Secondary | ICD-10-CM | POA: Diagnosis not present

## 2019-07-01 DIAGNOSIS — R262 Difficulty in walking, not elsewhere classified: Secondary | ICD-10-CM | POA: Diagnosis not present

## 2019-07-02 ENCOUNTER — Telehealth: Payer: Self-pay | Admitting: *Deleted

## 2019-07-02 NOTE — Telephone Encounter (Signed)
RNCM attempted Ortho bundle call- left VM for patient.

## 2019-07-03 DIAGNOSIS — R262 Difficulty in walking, not elsewhere classified: Secondary | ICD-10-CM | POA: Diagnosis not present

## 2019-07-03 DIAGNOSIS — M25562 Pain in left knee: Secondary | ICD-10-CM | POA: Diagnosis not present

## 2019-07-03 DIAGNOSIS — M6281 Muscle weakness (generalized): Secondary | ICD-10-CM | POA: Diagnosis not present

## 2019-07-04 ENCOUNTER — Telehealth: Payer: Self-pay | Admitting: *Deleted

## 2019-07-04 NOTE — Care Plan (Signed)
RNCM made call to patient for 30 day status. She states she is doing very well and having no current issues. Still attending OPPT and this is going well. Reviewed 30 day Patient satisfaction survey provided by TOM/THN in place of 10 question patient survey that is in [place].  1. Prior to surgery I was provided sufficient education regarding my surgery and the bundle program. Patient answer- Strongly agree 2. I was satisfied with the care I received at the facility where my surgery was performed- Patient's answer- Strongly agree 3. Following surgery, I received sufficient postoperative care instructions- Patient answer- Strongly agree 4. I would recommend my surgeon and this bundle program to others- Patient answer- Strongly agree Reminded of f/u on 07/18/19 and to call RNCM with any questions or concerns prior to that visit.

## 2019-07-04 NOTE — Telephone Encounter (Signed)
Ortho bundle 30 day call completed. °

## 2019-07-08 DIAGNOSIS — M6281 Muscle weakness (generalized): Secondary | ICD-10-CM | POA: Diagnosis not present

## 2019-07-08 DIAGNOSIS — R262 Difficulty in walking, not elsewhere classified: Secondary | ICD-10-CM | POA: Diagnosis not present

## 2019-07-08 DIAGNOSIS — M25562 Pain in left knee: Secondary | ICD-10-CM | POA: Diagnosis not present

## 2019-07-17 DIAGNOSIS — M25562 Pain in left knee: Secondary | ICD-10-CM | POA: Diagnosis not present

## 2019-07-17 DIAGNOSIS — R262 Difficulty in walking, not elsewhere classified: Secondary | ICD-10-CM | POA: Diagnosis not present

## 2019-07-17 DIAGNOSIS — M6281 Muscle weakness (generalized): Secondary | ICD-10-CM | POA: Diagnosis not present

## 2019-07-18 ENCOUNTER — Encounter: Payer: Self-pay | Admitting: Physician Assistant

## 2019-07-18 ENCOUNTER — Ambulatory Visit (INDEPENDENT_AMBULATORY_CARE_PROVIDER_SITE_OTHER): Payer: Medicare Other | Admitting: Physician Assistant

## 2019-07-18 DIAGNOSIS — Z96652 Presence of left artificial knee joint: Secondary | ICD-10-CM

## 2019-07-18 NOTE — Progress Notes (Signed)
HPI: Ms. Goates returns today 48 days status post left total knee arthroplasty she is doing very well.  She is having no pain.  She is been going to physical therapy and states therapy since she is doing well she is ready for discharge to home exercise program.  Range of motion therapy is been 0 to approximately 119 degrees.  Physical exam: Left knee full extension flexion to approximately 115 degrees no instability valgus varus stressing.  Surgical incisions healed well no signs of infection.  Calf supple nontender.  Impression: Status post left total knee arthroplasty 05/31/2019  Plan: She will discharge to home exercise program at this point.  She did like to discuss right knee surgery at some point in the near future either November or early January 2021.  We will have her follow-up with Dr. Ninfa Linden in 4 weeks to see how her left knee is doing and see if she is decided whether or not she would like to proceed with a right total knee arthroplasty.  Questions were encouraged and answered at length.  She also is asking about pain medications wondering if when she has the right total knee arthroplasty if she could have another pain medicine besides oxycodone.  Discussed with her possibly placing her on Norco and some Zofran for nausea.

## 2019-07-19 ENCOUNTER — Telehealth: Payer: Self-pay | Admitting: *Deleted

## 2019-07-19 NOTE — Care Plan (Signed)
RNCM met with patient while she was in office for a 6 week follow up with Benita Stabile, PA-C on 07/18/19. Patient verbalized she is taking nothing for pain and is doing extremely well at this time. She has full extension of the knee and 119 degrees flexion she reports. She is scheduled for 1 more visit with OPPT, but her therapist discussed she not needing anymore visits as she is doing so well. Confirmed with PA that no further therapy is needed and RNCM contacted office to update treating therapy staff that no further visits are needed. Patient did discuss with PA and RNCM that she would like to have the other knee done at some point, but did not want to wait until next summer. Would like to discuss possibly having it done in November or January so that she will feel good for the upcoming holidays. F/U scheduled in 4 weeks on 08/15/19 with Dr. Ninfa Linden to check status of left knee and discuss right knee surgery possibly. Will continue to follow for any case management needs.

## 2019-07-19 NOTE — Telephone Encounter (Signed)
Ortho bundle in office visit completed. 

## 2019-07-22 ENCOUNTER — Other Ambulatory Visit: Payer: Self-pay

## 2019-07-22 ENCOUNTER — Encounter: Payer: Self-pay | Admitting: Neurology

## 2019-07-22 ENCOUNTER — Ambulatory Visit (INDEPENDENT_AMBULATORY_CARE_PROVIDER_SITE_OTHER): Payer: Medicare Other | Admitting: Neurology

## 2019-07-22 VITALS — BP 152/82 | HR 60 | Temp 98.0°F | Ht 63.0 in | Wt 180.0 lb

## 2019-07-22 DIAGNOSIS — F5104 Psychophysiologic insomnia: Secondary | ICD-10-CM | POA: Insufficient documentation

## 2019-07-22 MED ORDER — ZOLPIDEM TARTRATE 10 MG PO TABS: 5.0000 mg | ORAL_TABLET | Freq: Every day | ORAL | 1 refills | Status: DC

## 2019-07-22 MED ORDER — TRAZODONE HCL 50 MG PO TABS: 25.0000 mg | ORAL_TABLET | Freq: Every day | ORAL | 3 refills | Status: DC

## 2019-07-22 NOTE — Patient Instructions (Signed)
Please remember to try to maintain good sleep hygiene, which means: Keep a regular sleep and wake schedule, try not to exercise or have a meal within 2 hours of your bedtime, try to keep your bedroom conducive for sleep, that is, cool and dark, without light distractors such as an illuminated alarm clock, and refrain from watching TV right before sleep or in the middle of the night and do not keep the TV or radio on during the night. Also, try not to use or play on electronic devices at bedtime, such as your cell phone, tablet PC or laptop. If you like to read at bedtime on an electronic device, try to dim the background light as much as possible. Do not eat in the middle of the night.     

## 2019-07-22 NOTE — Progress Notes (Signed)
PATIENT: [name] DOB: 1946-04-24  REASON FOR VISIT: follow up- insomnia HISTORY FROM: patient  HISTORY OF PRESENT ILLNESS:   9-14- 2020, RV with [name], a 73 year old hearing impaired Caucasian female patient.  [name] lost her husband and is now living by herself, she has not to her knowledge contracted any COVID or was exposed to.  She has been followed here for years for obstructive sleep apnea with sleep hypoxemia and has also chronic insomnia.  She is not daytime excessively sleepy endorsing the Epworth score at 1 point today the fatigue severity at 18 points and the geriatric depression score at 4 out of 15 points.  Her hearing aids no longer serve her well and therefore we have switched to a face shield on my part to that she can read my lips.medicare no longer paid for her machine. No longer on CPAP. Pharnacy is now [facility]- she wants Ambien refilled, but understands 5 mg is her age limit. She underwent knee replacement. Post surgery, left knee- &-24-2020.,she used oxycodone and tramadol, but couldn't  tolerate these, she lost appetit , was latently nauseated.  She wants to use Ambien instead.    07-18-2018, Rv with [name] -a 73 year old female patient who has become her husbands caretaker.   She endorses today the fatigue severity scale at 29 points, and her Epworth sleepiness score at 2 points.  The patient has suffered for a long time but is chronic insomnia, and underlying condition that has been treated with Ambien at 10 mg.  And she continues to have a.  Of wakefulness somewhere between 2 and 3 AM, this is also correlated to her Fitbit recording.  Her husband continues to receive chemotherapy, she had been successful in losing some weight but has been stable, certainly stress related.    01-15-2018, RV for this 73 year old caucasian married female with chronic insomnia- and who has learnt to live with it. She reports bedtime is around 11.30 pm, sleeping  for about 7 hours total.  Her husband is on chemotherapy and tolerating it well, he takes naps in daytime, she doesn't.  She has lost weight again, and has not gained back over the last 12 month. She reports her cardiologist is happy with her, too. Her endorsing the Epworth score at only 2 points, FSS 23,   Today 12/27/16: [name] is a 73 year old female with a history of chronic insomnia. She returns today for follow-up. At her last visit she was advised that she did not have to use the CPAP as it was not offering her much benefit. She states that she's been using Ambien 10 mg at bedtime. She states that this is the only medication that has offered her some benefit. She states that she still wakes up numerous times a night but she is able to go back to sleep. She reports on the rare occasion she will have a night that she cannot sleep at all. She was referred to Dr. Casimiro Needle but states that she never made an appointment. She states that her husband has had a lot of health issues and may being consumed with that for the last several months. She states that she understands that she has a lot of anxiety but she does not feel depressed. She returns today for an evaluation.  HISTORY  01-20-2012 per Dr. Brett Fairy' notes: [name] is a 73 female and seen here in referral  from Dr. Deborra Medina for chronic  insomnia. The patient will be evaluated for organic reasons of insomnia.   The patient was last seen on  01-20-12  and has lost a lot of weight in the meantime. After trazodone and Remeron did fail to produce satisfying results for now she will sometimes take a Benadryl or Tylenol PM to assist her with her sleep initiation. She thinks nocturia  was never the main problem. The patient states that she has lost a lot of weight intentionally and that this has helped certainly some of her other health issues including hypertension. She was diagnosed with atrial fibrillation in the past has been on chronic  anticoagulation. She reported that topiramate caused her to be drowsy and daytime when taken in the morning for that reason we will change the intake time 2 PM this may assist with her sleepiness as well as was her headaches. It has also helped her to lose weight.  At bedtime routines were reviewed : the patient was to bed around 11 PM rises at 6:30 AM depending on how much sleep she got.  She did have amnestic spells from after some nights when using Ambien- but she sleeps all much better on Ambien but on the other medications.  The onset of her insomnia was related to the development of menopause but she only has really hot flashes now- she uses Ambien chronically for 2 decades.   Her bedroom is described as quiet, cool and dark she shares a bedroom with her husband, who is very sick.    Interval history , 01-13-15 CD, [name] is seen here following up on her sleep study from 01-29-14 the study showed a patient with normal heart rate some prolonged oxygen desaturation and very mild apnea. A problem for the patient was that she couldn't really sleep through the night she was 53 minutes awake during her sleep study. 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 Dr. Zenia Resides had originally been referred to the patient for insomnia evaluation. Based on the hypoxemia and the mix of a mild sleep apnea , she could be invited the patient to come in for a CPAP titration split-night study. The patient is beginning cataracts and she has just been seen by optometry, she has been insomnic again. Testing was suspected to reveal also retinal disease and she was referred to Dr. Zadie Rhine a retinal specialist. She has a macular pucker. Surgery is scheduled for tomorrow morning 01-14-15. She is in need of a refill for Ambien which he uses to treat her chronic insomnia. In spite of this the last week had been difficult for her I think because of anxiety in anticipation of the surgery. Her fatigue severity score is up to  40 points and her fatigue her Epworth sleepiness score is normal at 2 points.  Her sleep is not restorative, still. She will have an overnight pulse-oximetry after her surgery is done. I will schedule this though [facility].  Rv with NP or me in May-June 2016.  06-04-15, Mrs. Navalta is here status post cataract ectomy bilaterally and surgery to the retina. As I had quoted in my last visit she underwent a sleep study on 3-20 5-15. She continued to complain of nonrestorative not refreshing sleep. She feels however that she has started to manage better with that and her fatigue is no longer as excessive. She endorsed the fatigue severity score of 31 points and the Epworth sleepiness score at 3 points. She's not likely to fall asleep unintended. She doesn't struggle with  sleep attacks. In her geriatric depression score she endorsed only 1 point which is not indicative of suffering from clinical depression. She wears a fit bit, which records her pulse, she wore a ONO, and had 2 hours of low oxygen levels, she will need a referral to pulmonology for further evaluation or will proceed with a CPAP auto-titration, to see if correcting the mild apnea is an option. She needs to lose weight.   04-25-2016 Mrs. Mapa was last seen by mypractitioner Cecille Rubin, and referred for a new CPAP titration on 01/27/2016. She was again diagnosed with insomnia and a low sleep efficiency of only 72.5% CPAP was explored from 5 through 8 cm water. And an air-fit P 10 nasal pillow in extra small size was used. She still reports that she feels restless and uncomfortable just was having anything in her face or attached to her body. And of course the PLM arousals were not addressed by CPAP. She would like to sleep on her side but feels compelled to sleep in supine position so that the mass does not get as long.  The compliance data since May 30 show 14 out of 15 days of use she used to machine over 4 hours for only 11 out of  15 days. A 74% compliance. The average user time is 4 hours and 56 minutes. The set pressure was 8 cm water with 3 cm EPR and her residual AHI is 2.9 there is a significant reduction in her apnea index. However her baseline AHI was not that high in 2015 with an AHI of 9. I think given that the patient feels no improvement in her daytime alertness and actually more bothered by the machine and given that she has a mild apnea to begin with I would allow her to discontinue the use of CPAP she could change to a dental device is snoring bothers her, but she denies. She will concentrate on weight loss.    REVIEW OF SYSTEMS: Out of a complete 14 system review of symptoms, the patient complains only of the following symptoms, and all other reviewed systems are negative.  Back pain, chronic insomnia, even when on narcotics after knee surgery-   How likely are you to doze in the following situations: 0 = not likely, 1 = slight chance, 2 = moderate chance, 3 = high chance  Sitting and Reading? Watching Television? Sitting inactive in a public place (theater or meeting)? Lying down in the afternoon when circumstances permit? Sitting and talking to someone? Sitting quietly after lunch without alcohol? In a car, while stopped for a few minutes in traffic? As a passenger in a car for an hour without a break?  Total = 1/ 24   frequent waking  ALLERGIES: Allergies  Allergen Reactions   Codeine Nausea And Vomiting    HOME MEDICATIONS: Outpatient Medications Prior to Visit  Medication Sig Dispense Refill   celecoxib (CELEBREX) 100 MG capsule TAKE 1 CAPSULE BY MOUTH TWICE A DAY (Patient taking differently: Take 100 mg by mouth 2 (two) times daily. ) 180 capsule 1   cyclobenzaprine (FLEXERIL) 5 MG tablet Please specify directions, refills and quantity 30 tablet 0   ELIQUIS 5 MG TABS tablet TAKE 1 TABLET BY MOUTH TWICE A DAY (Patient taking differently: Take 5 mg by mouth 2 (two) times daily. ) 60  tablet 8   hydroxypropyl methylcellulose / hypromellose (ISOPTO TEARS / GONIOVISC) 2.5 % ophthalmic solution Place 1 drop into both eyes 3 (three) times daily as  needed for dry eyes.     losartan (COZAAR) 100 MG tablet Take 1 tablet (100 mg total) by mouth daily. 90 tablet 3   methocarbamol (ROBAXIN) 500 MG tablet Take 1 tablet (500 mg total) by mouth 4 (four) times daily. 60 tablet 1   metoprolol succinate (TOPROL-XL) 50 MG 24 hr tablet TAKE 1 TABLET BY MOUTH EVERY DAY (Patient taking differently: Take 50 mg by mouth daily. ) 90 tablet 0   ondansetron (ZOFRAN ODT) 4 MG disintegrating tablet Take 1 tablet (4 mg total) by mouth every 8 (eight) hours as needed for nausea or vomiting. 20 tablet 0   oxyCODONE (OXY IR/ROXICODONE) 5 MG immediate release tablet Take 1-2 tablets (5-10 mg total) by mouth every 6 (six) hours as needed for moderate pain (pain score 4-6). 40 tablet 0   propafenone (RYTHMOL) 300 MG tablet Take 1 tablet (300 mg total) by mouth 2 (two) times daily. 180 tablet 1   traMADol (ULTRAM) 50 MG tablet Take 1-2 tablets (50-100 mg total) by mouth every 6 (six) hours as needed. (Patient taking differently: Take 50 mg by mouth every 6 (six) hours as needed for severe pain. ) 40 tablet 0   zolpidem (AMBIEN) 10 MG tablet TAKE ONE-HALF TABLET BY MOUTH AT BEDTIME AS NEEDED (Patient taking differently: Take 5 mg by mouth at bedtime. TAKE ONE-HALF TABLET BY MOUTH AT BEDTIME AS NEEDED) 45 tablet 0   No facility-administered medications prior to visit.     PAST MEDICAL HISTORY: Past Medical History:  Diagnosis Date   Acute upper respiratory infections of unspecified site    Arthritis    Carotid bruit    Cataracts, both eyes    Chronic insomnia    Colon polyps    Diverticulosis    pt denies   Dysrhythmia    a fib   Fatty liver    Hiatal hernia    History of gallstones    Hx of adenomatous colonic polyps    Hyperlipidemia    Hypertension    Insomnia, controlled  01/01/2014   Internal hemorrhoids    Macular pucker, left eye    surgery planned 01-14-15   Menopause    noticed in early 50's   Other acute sinusitis    Other malaise and fatigue    Paroxysmal atrial fibrillation ([place])    on meds   Pneumonia    as a small child   PONV (postoperative nausea and vomiting)    Pure hypercholesterolemia    Restless leg syndrome    Routine general medical examination at a health care facility    Screening for lipoid disorders    Sleep apnea    no c-pap   Special screening for malignant neoplasms, colon     PAST SURGICAL HISTORY: Past Surgical History:  Procedure Laterality Date   cardiolyte neg  6/06   cardiovascular stress MRI  12/20/06   afib   CHOLECYSTECTOMY     COLONOSCOPY     EYE SURGERY     cataract   Toric implant bil eyes   KNEE ARTHROSCOPY     left   ORIF- L distal radius  9/07   left wrist   PARS PLANA VITRECTOMY W/ REPAIR OF MACULAR HOLE     left eye   torn meniscus     bil knees     TOTAL KNEE ARTHROPLASTY Left 05/31/2019   Procedure: LEFT TOTAL KNEE ARTHROPLASTY;  Surgeon: Mcarthur Rossetti, MD;  Location: [facility];  Service: Orthopedics;  Laterality: Left;   TUBAL LIGATION     UPPER GI ENDOSCOPY      FAMILY HISTORY: Family History  Problem Relation Age of Onset   Breast cancer Mother    Heart disease Father    Colon cancer Cousin 36   Diabetes Other        cousins    SOCIAL HISTORY: Social History   Socioeconomic History   Marital status: Widowed    Spouse name: Sterling Big   Number of children: 3   Years of education: College   Highest education level: Not on file  Occupational History   Occupation: Product manager: [employer] resource strain: Not on file   Food insecurity    Worry: Not on file    Inability: Not on file   Transportation needs    Medical: Not on file    Non-medical: Not on file  Tobacco Use   Smoking status:  Never Smoker   Smokeless tobacco: Never Used  Substance and Sexual Activity   Alcohol use: Not Currently    Comment: rarely   Drug use: No   Sexual activity: Never  Lifestyle   Physical activity    Days per week: Not on file    Minutes per session: Not on file   Stress: Not on file  Relationships   Social connections    Talks on phone: Not on file    Gets together: Not on file    Attends religious service: Not on file    Active member of club or organization: Not on file    Attends meetings of clubs or organizations: Not on file    Relationship status: Not on file   Intimate partner violence    Fear of current or ex partner: Not on file    Emotionally abused: Not on file    Physically abused: Not on file    Forced sexual activity: Not on file  Other Topics Concern   Not on file  Social History Narrative   Patient is widowed. Patient has three adult children.   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    Patient is right-handed.   Patient drinks two cups of caffeine daily.   Patient has a college education.     DIAGNOSTIC DATA (LABS, IMAGING, TESTING) - I reviewed patient records, labs, notes, testing and imaging myself where available.  Lab Results  Component Value Date   WBC 9.3 06/01/2019   HGB 11.4 (L) 06/01/2019   HCT 35.1 (L) 06/01/2019   MCV 94.4 06/01/2019   PLT 178 06/01/2019    Lab Results  Component Value Date   CHOL 187 12/25/2018   HDL 61 12/25/2018   LDLCALC 105 (H) 12/25/2018   LDLDIRECT 137.0 08/10/2010   TRIG 107 12/25/2018   CHOLHDL 3.1 12/25/2018    Lab Results  Component Value Date   TSH 1.190 12/25/2018      PHYSICAL EXAM  Vitals:   07/22/19 1103  BP: (!) 152/82  Pulse: 60  Temp: 98 F (36.7 C)  Weight: 180 lb (81.6 kg)  Height: 5\' 3"  (1.6 m)   Body mass index is 31.89 kg/m.  Physical exam:  General: The patient is awake, alert and appears not in acute distress. The  patient is well groomed. She uses often a 5 mg tab -  Head: Normocephalic, atraumatic. Neck is supple. Mallampati 3 , neck circumference:15 , Cardiovascular:  irregular rate and rhythm , without  murmurs or carotid bruit, and without distended neck veins. Respiratory: Lungs are clear to auscultation. Skin:  Without evidence of edema, or rash Trunk: BMI is elevated.  Neurologic exam : The patient is awake and alert, oriented to place and time.  Memory subjective described as intact. There is a normal attention span & concentration ability. Speech is fluent without  dysarthria, dysphonia or aphasia. Mood and affect are appropriate.  Cranial nerves:  taste and smel are intact. Pupils are equal and briskly reactive to light, she has left over right ptosis- the eyelid straddles the pupil. Retinal surgery scars noted.   Full EOM. Hearing to finger rub intact.  Facial sensation intact to fine touch. Facial motor strength is symmetric and tongue and uvula move midline. Tongue protrusion intact.  Assessment:  After physical and neurologic examination, review of laboratory studies, imaging, neurophysiology testing and pre-existing records, assessment is   1) likely Insomnia with mild OSA - patient reported reluctantly about her snoring and waking up with a dry mouth.  No morning headaches , no nocturia. She is not wanting CPAP. Will d/c   2) BMI is still high- she may benefit from medical weight management. 3) she continues to be chronically anticoagulated for atrial fibrillation.  .    Plan:  Treatment plan and additional workup : 1) I allowed in 2019  with medical consent to d/c the CPAP use.  2) Refilled Ambien prn.  It's truly a daily drug now, and the habit is formed. She is no longer a caretaker ( Husband died 11/03/2018 ) .  I suggested switching to Trazodone.  We start with 25 mg trazodone and she is allowed to add ambien only as needed, not more than 4 times a week  3) Headaches- tension /   describes no photophobia and no nausea. Better since stress reduced, .    She will follow-up in 6 months with NP- YES, NP!!   Larey Seat, MD  07/22/2019, 11:10 AM [facility] [address] [place], [place] [address] [phone]

## 2019-08-03 ENCOUNTER — Other Ambulatory Visit: Payer: Self-pay | Admitting: Cardiology

## 2019-08-15 ENCOUNTER — Other Ambulatory Visit: Payer: Self-pay

## 2019-08-15 ENCOUNTER — Encounter: Payer: Self-pay | Admitting: Orthopaedic Surgery

## 2019-08-15 ENCOUNTER — Ambulatory Visit (INDEPENDENT_AMBULATORY_CARE_PROVIDER_SITE_OTHER): Payer: Medicare Other | Admitting: Orthopaedic Surgery

## 2019-08-15 DIAGNOSIS — Z96652 Presence of left artificial knee joint: Secondary | ICD-10-CM

## 2019-08-15 DIAGNOSIS — M1711 Unilateral primary osteoarthritis, right knee: Secondary | ICD-10-CM

## 2019-08-15 NOTE — Progress Notes (Signed)
The patient comes in today 76 days status post a left total knee arthroplasty.  She says she is doing great with great range of motion and strength.  She is very active.  She is very happy with her left knee.  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  Given the good outcome with her left knee she was to go ahead and have her right knee replaced.  Her right knee pain is daily and now it is detriment affecting her mobility, her quality of life and actives daily living.  She is tried and failed conservative treatment of the right knee for over a year now.  This includes physical therapy, multiple injections, activity modification, anti-inflammatories and time.  Her x-rays show significant varus malalignment of the right knee.  There is complete loss of the medial joint space as well as periarticular osteophytes in all 3 compartments.  On examination of her left operative knee it is straight.  Her incisions well-healed.  Her flexion extension are full.  Her left knee is stable.  Her right knee is examined and shows varus malalignment.  She has severe medial joint line tenderness and patellofemoral crepitation.  Her range of motion is limited secondary to pain and there is mild effusion.  At this point I agree with proceeding with a right total knee arthroplasty.  Having had this before she is fully aware of the risk and benefits of surgery.  She understands what her operative and postoperative course involve.  All question concerns were answered addressed.  We will work on getting her right knee scheduled for right total knee arthroplasty.

## 2019-09-03 ENCOUNTER — Other Ambulatory Visit: Payer: Self-pay | Admitting: Cardiology

## 2019-09-03 DIAGNOSIS — I480 Paroxysmal atrial fibrillation: Secondary | ICD-10-CM

## 2019-09-04 NOTE — Telephone Encounter (Signed)
Please advise if OK to refill. Thank you! 

## 2019-09-05 ENCOUNTER — Telehealth: Payer: Self-pay | Admitting: *Deleted

## 2019-09-05 NOTE — Telephone Encounter (Signed)
Attempted Ortho bundle 90 day call. No answer and unable to leave a message. Will try again.

## 2019-09-06 ENCOUNTER — Telehealth: Payer: Self-pay | Admitting: *Deleted

## 2019-09-06 NOTE — Telephone Encounter (Signed)
90 day ortho bundle call and survey completed.

## 2019-09-06 NOTE — Care Plan (Signed)
RNCM call to patient to review status at 90 days post-op for Left total knee. She verbalized she has just gotten scheduled with Dr. Ninfa Linden for the Right knee and RNCM will be checking in with her next week to discuss a pre-op call for this. Overall she states she is doing well and the knee is more functional. She is still having some mild swelling and some pain when she stops at night. Reviewed 90 day survey. Will be in touch to discuss Ortho bundle for upcoming right total knee replacement.

## 2019-09-09 ENCOUNTER — Other Ambulatory Visit: Payer: Self-pay

## 2019-09-12 ENCOUNTER — Other Ambulatory Visit: Payer: Self-pay | Admitting: Physician Assistant

## 2019-09-16 ENCOUNTER — Encounter (HOSPITAL_COMMUNITY): Payer: Self-pay

## 2019-09-16 NOTE — Patient Instructions (Signed)
DUE TO COVID-19 ONLY ONE VISITOR IS ALLOWED TO COME WITH YOU AND STAY IN THE WAITING ROOM ONLY DURING PRE OP AND PROCEDURE. THE ONE VISITOR MAY VISIT WITH YOU IN YOUR PRIVATE ROOM DURING VISITING HOURS ONLY!!   COVID SWAB TESTING MUST BE COMPLETED ON: Today immediately after pre op appointment [address] [place] [facility] pre surgical testing line (Must self quarantine after testing. Follow instructions on handout.)             Your procedure is scheduled on: Friday, Nov. 13, 2020   Report to [facility] Main  Entrance    Report to admitting at 6:00 AM   Call this number if you have problems the morning of surgery [phone]   Bring CPAP mask and tubing day of surgery   Do not eat food:After Midnight.   May have liquids until 5:30 AM day of surgery   CLEAR LIQUID DIET  Foods Allowed                                                                     Foods Excluded  Water, Black Coffee and tea, regular and decaf                             liquids that you cannot  Plain Jell-O in any flavor  (No red)                                           see through such as: Fruit ices (not with fruit pulp)                                     milk, soups, orange juice  Iced Popsicles (No red)                                    All solid food Carbonated beverages, regular and diet                                    Apple juices Sports drinks like Gatorade (No red) Lightly seasoned clear broth or consume(fat free) Sugar, honey syrup  Sample Menu Breakfast                                Lunch                                     Supper Cranberry juice                    Beef broth  Chicken broth Jell-O                                     Grape juice                           Apple juice Coffee or tea                        Jell-O                                      Popsicle  Coffee or tea                        Coffee or tea   Complete one Ensure drink the morning of surgery at 5:30 AM the day of surgery.   Brush your teeth the morning of surgery.   Do NOT smoke after Midnight   Take these medicines the morning of surgery with A SIP OF WATER: Propafenone   May use eye drops day of surgery if needed                               You may not have any metal on your body including hair pins, jewelry, and body piercings             Do not wear make-up, lotions, powders, perfumes/cologne, or deodorant             Do not wear nail polish.  Do not shave  48 hours prior to surgery.               Do not bring valuables to the hospital. [place].   Contacts, dentures or bridgework may not be worn into surgery.   Bring small overnight bag day of surgery.    Special Instructions: Bring a copy of your healthcare power of attorney and living will documents         the day of surgery if you haven't scanned them in before.              Please read over the following fact sheets you were given:  [facility] - Preparing for Surgery Before surgery, you can play an important role.  Because skin is not sterile, your skin needs to be as free of germs as possible.  You can reduce the number of germs on your skin by washing with CHG (chlorahexidine gluconate) soap before surgery.  CHG is an antiseptic cleaner which kills germs and bonds with the skin to continue killing germs even after washing. Please DO NOT use if you have an allergy to CHG or antibacterial soaps.  If your skin becomes reddened/irritated stop using the CHG and inform your nurse when you arrive at Short Stay. Do not shave (including legs and underarms) for at least 48 hours prior to the first CHG shower.  You may shave your face/neck.  Please follow these instructions carefully:  1.  Shower with CHG Soap the night before surgery and the  morning of surgery.  2.   If you  choose to wash your hair, wash your hair first as usual with your normal  shampoo.  3.  After you shampoo, rinse your hair and body thoroughly to remove the shampoo.                             4.  Use CHG as you would any other liquid soap.  You can apply chg directly to the skin and wash.  Gently with a scrungie or clean washcloth.  5.  Apply the CHG Soap to your body ONLY FROM THE NECK DOWN.   Do   not use on face/ open                           Wound or open sores. Avoid contact with eyes, ears mouth and   genitals (private parts).                       Wash face,  Genitals (private parts) with your normal soap.             6.  Wash thoroughly, paying special attention to the area where your    surgery  will be performed.  7.  Thoroughly rinse your body with warm water from the neck down.  8.  DO NOT shower/wash with your normal soap after using and rinsing off the CHG Soap.                9.  Pat yourself dry with a clean towel.            10.  Wear clean pajamas.            11.  Place clean sheets on your bed the night of your first shower and do not  sleep with pets. Day of Surgery : Do not apply any lotions/deodorants the morning of surgery.  Please wear clean clothes to the hospital/surgery center.  FAILURE TO FOLLOW THESE INSTRUCTIONS MAY RESULT IN THE CANCELLATION OF YOUR SURGERY  PATIENT SIGNATURE_________________________________  NURSE SIGNATURE__________________________________  ________________________________________________________________________   [name]  An incentive spirometer is a tool that can help keep your lungs clear and active. This tool measures how well you are filling your lungs with each breath. Taking long deep breaths may help reverse or decrease the chance of developing breathing (pulmonary) problems (especially infection) following:  A long period of time when you are unable to move or be active. BEFORE THE PROCEDURE   If the  spirometer includes an indicator to show your best effort, your nurse or respiratory therapist will set it to a desired goal.  If possible, sit up straight or lean slightly forward. Try not to slouch.  Hold the incentive spirometer in an upright position. INSTRUCTIONS FOR USE  1. Sit on the edge of your bed if possible, or sit up as far as you can in bed or on a chair. 2. Hold the incentive spirometer in an upright position. 3. Breathe out normally. 4. Place the mouthpiece in your mouth and seal your lips tightly around it. 5. Breathe in slowly and as deeply as possible, raising the piston or the ball toward the top of the column. 6. Hold your breath for 3-5 seconds or for as long as possible. Allow the piston or ball to fall to the bottom of the column. 7. Remove the mouthpiece from your mouth and breathe out  normally. 8. Rest for a few seconds and repeat Steps 1 through 7 at least 10 times every 1-2 hours when you are awake. Take your time and take a few normal breaths between deep breaths. 9. The spirometer may include an indicator to show your best effort. Use the indicator as a goal to work toward during each repetition. 10. After each set of 10 deep breaths, practice coughing to be sure your lungs are clear. If you have an incision (the cut made at the time of surgery), support your incision when coughing by placing a pillow or rolled up towels firmly against it. Once you are able to get out of bed, walk around indoors and cough well. You may stop using the incentive spirometer when instructed by your caregiver.  RISKS AND COMPLICATIONS  Take your time so you do not get dizzy or light-headed.  If you are in pain, you may need to take or ask for pain medication before doing incentive spirometry. It is harder to take a deep breath if you are having pain. AFTER USE  Rest and breathe slowly and easily.  It can be helpful to keep track of a log of your progress. Your caregiver can provide  you with a simple table to help with this. If you are using the spirometer at home, follow these instructions: [place] IF:   You are having difficultly using the spirometer.  You have trouble using the spirometer as often as instructed.  Your pain medication is not giving enough relief while using the spirometer.  You develop fever of 100.5 F (38.1 C) or higher. SEEK IMMEDIATE MEDICAL CARE IF:   You cough up bloody sputum that had not been present before.  You develop fever of 102 F (38.9 C) or greater.  You develop worsening pain at or near the incision site. MAKE SURE YOU:   Understand these instructions.  Will watch your condition.  Will get help right away if you are not doing well or get worse. Document Released: 03/06/2007 Document Revised: 01/16/2012 Document Reviewed: 05/07/2007 [facility] Patient Information 2014 [place], [place].   ________________________________________________________________________

## 2019-09-17 ENCOUNTER — Encounter (HOSPITAL_COMMUNITY)
Admission: RE | Admit: 2019-09-17 | Discharge: 2019-09-17 | Disposition: A | Discharge status: Home / Self Care | Payer: Medicare Other | Source: Ambulatory Visit | Attending: Orthopaedic Surgery | Admitting: Orthopaedic Surgery

## 2019-09-17 ENCOUNTER — Encounter (HOSPITAL_COMMUNITY): Payer: Self-pay

## 2019-09-17 ENCOUNTER — Other Ambulatory Visit: Payer: Self-pay

## 2019-09-17 ENCOUNTER — Other Ambulatory Visit (HOSPITAL_COMMUNITY)
Admission: RE | Admit: 2019-09-17 | Discharge: 2019-09-17 | Disposition: A | Discharge status: Home / Self Care | Payer: Medicare Other | Source: Ambulatory Visit | Attending: Orthopaedic Surgery | Admitting: Orthopaedic Surgery

## 2019-09-17 DIAGNOSIS — Z96652 Presence of left artificial knee joint: Secondary | ICD-10-CM | POA: Insufficient documentation

## 2019-09-17 DIAGNOSIS — Z20828 Contact with and (suspected) exposure to other viral communicable diseases: Secondary | ICD-10-CM | POA: Diagnosis not present

## 2019-09-17 DIAGNOSIS — I480 Paroxysmal atrial fibrillation: Secondary | ICD-10-CM | POA: Diagnosis not present

## 2019-09-17 DIAGNOSIS — I10 Essential (primary) hypertension: Secondary | ICD-10-CM | POA: Diagnosis not present

## 2019-09-17 DIAGNOSIS — H9113 Presbycusis, bilateral: Secondary | ICD-10-CM | POA: Insufficient documentation

## 2019-09-17 DIAGNOSIS — H269 Unspecified cataract: Secondary | ICD-10-CM | POA: Insufficient documentation

## 2019-09-17 DIAGNOSIS — Z9049 Acquired absence of other specified parts of digestive tract: Secondary | ICD-10-CM | POA: Diagnosis not present

## 2019-09-17 DIAGNOSIS — Z01812 Encounter for preprocedural laboratory examination: Secondary | ICD-10-CM | POA: Diagnosis not present

## 2019-09-17 DIAGNOSIS — Z79899 Other long term (current) drug therapy: Secondary | ICD-10-CM | POA: Diagnosis not present

## 2019-09-17 DIAGNOSIS — Z7901 Long term (current) use of anticoagulants: Secondary | ICD-10-CM | POA: Insufficient documentation

## 2019-09-17 DIAGNOSIS — M1711 Unilateral primary osteoarthritis, right knee: Secondary | ICD-10-CM | POA: Insufficient documentation

## 2019-09-17 DIAGNOSIS — F5104 Psychophysiologic insomnia: Secondary | ICD-10-CM | POA: Insufficient documentation

## 2019-09-17 DIAGNOSIS — H903 Sensorineural hearing loss, bilateral: Secondary | ICD-10-CM | POA: Diagnosis not present

## 2019-09-17 HISTORY — DX: Cardiac murmur, unspecified: R01.1

## 2019-09-17 HISTORY — DX: Gastro-esophageal reflux disease without esophagitis: K21.9

## 2019-09-17 LAB — BASIC METABOLIC PANEL
Anion gap: 7 (ref 5–15)
BUN: 29 mg/dL — ABNORMAL HIGH (ref 8–23)
CO2: 24 mmol/L (ref 22–32)
Calcium: 8.9 mg/dL (ref 8.9–10.3)
Chloride: 107 mmol/L (ref 98–111)
Creatinine, Ser: 1.06 mg/dL — ABNORMAL HIGH (ref 0.44–1.00)
GFR calc Af Amer: 59 mL/min — ABNORMAL LOW (ref >60)
GFR calc non Af Amer: 51 mL/min — ABNORMAL LOW (ref >60)
Glucose, Bld: 95 mg/dL (ref 70–99)
Potassium: 4.5 mmol/L (ref 3.5–5.1)
Sodium: 138 mmol/L (ref 135–145)

## 2019-09-17 LAB — CBC
HCT: 41.6 % (ref 36.0–46.0)
Hemoglobin: 13.6 g/dL (ref 12.0–15.0)
MCH: 30.4 pg (ref 26.0–34.0)
MCHC: 32.7 g/dL (ref 30.0–36.0)
MCV: 92.9 fL (ref 80.0–100.0)
Platelets: 214 10*3/uL (ref 150–400)
RBC: 4.48 MIL/uL (ref 3.87–5.11)
RDW: 13 % (ref 11.5–15.5)
WBC: 6.5 10*3/uL (ref 4.0–10.5)
nRBC: 0 % (ref 0.0–0.2)

## 2019-09-17 LAB — SURGICAL PCR SCREEN
MRSA, PCR: NEGATIVE
Staphylococcus aureus: NEGATIVE

## 2019-09-17 MED ORDER — CHLORHEXIDINE GLUCONATE 4 % EX LIQD
60.0000 mL | Freq: Once | CUTANEOUS | Status: DC
  Filled 2019-09-17: qty 60

## 2019-09-17 NOTE — Progress Notes (Signed)
PCP - Dr. Bjorn Loser at [facility] Cardiologist -   Chest x-ray -  EKG - 12/25/2018. Epic Stress Test - Greater than 2 years ECHO - Greater than 2 years Cardiac Cath -   Sleep Study -  CPAP -   Fasting Blood Sugar -  Checks Blood Sugar _____ times a day  Blood Thinner Instructions: Eliquis. [place]ast dose:09/17/2019 morning dose. Aspirin Instructions: Last Dose:  Anesthesia review: PAF,OSA  Patient denies shortness of breath, fever, cough and chest pain at PAT appointment   Patient verbalized understanding of instructions that were given to them at the PAT appointment. Patient was also instructed that they will need to review over the PAT instructions again at home before surgery.

## 2019-09-17 NOTE — Progress Notes (Signed)
SPOKE W/  Jabrea     SCREENING SYMPTOMS OF COVID 19:   COUGH--NO  RUNNY NOSE--- NO  SORE THROAT---NO  NASAL CONGESTION----NO  SNEEZING----NO  SHORTNESS OF BREATH---NO  DIFFICULTY BREATHING---NO  TEMP >100.0 -----NO  UNEXPLAINED BODY ACHES------NO  CHILLS -------- NO  HEADACHES ---------NO  LOSS OF SMELL/ TASTE --------NO    HAVE YOU OR ANY FAMILY MEMBER TRAVELLED PAST 14 DAYS OUT OF THE   COUNTY---NO STATE----NO COUNTRY----NO  HAVE YOU OR ANY FAMILY MEMBER BEEN EXPOSED TO ANYONE WITH COVID 19? NO

## 2019-09-17 NOTE — Progress Notes (Signed)
BMP results router to MD via [employer]

## 2019-09-18 NOTE — Anesthesia Preprocedure Evaluation (Addendum)
Anesthesia Evaluation  Patient identified by MRN, date of birth, ID band Patient awake    Reviewed: Allergy & Precautions, NPO status , Patient's Chart, lab work & pertinent test results  History of Anesthesia Complications (+) PONV  Airway Mallampati: II  TM Distance: >3 FB Neck ROM: Full    Dental  (+) Teeth Intact, Dental Advisory Given   Pulmonary sleep apnea ,    breath sounds clear to auscultation       Cardiovascular hypertension, Pt. on medications and Pt. on home beta blockers + dysrhythmias Atrial Fibrillation + Valvular Problems/Murmurs MR  Rhythm:Regular Rate:Normal     Neuro/Psych Anxiety Depression  Neuromuscular disease    GI/Hepatic Neg liver ROS, hiatal hernia, GERD  ,  Endo/Other  negative endocrine ROS  Renal/GU Renal InsufficiencyRenal disease     Musculoskeletal  (+) Arthritis , Osteoarthritis,    Abdominal Normal abdominal exam  (+)   Peds  Hematology negative hematology ROS (+)   Anesthesia Other Findings   Reproductive/Obstetrics                          Lab Results  Component Value Date   WBC 6.5 09/17/2019   HGB 13.6 09/17/2019   HCT 41.6 09/17/2019   MCV 92.9 09/17/2019   PLT 214 09/17/2019   Lab Results  Component Value Date   INR 3.2 09/28/2015   INR 2.6 09/02/2015   INR 3.4 08/12/2015   PROTIME 19.2 04/14/2009     Anesthesia Physical Anesthesia Plan  ASA: II  Anesthesia Plan: Spinal   Post-op Pain Management:  Regional for Post-op pain   Induction: Intravenous  PONV Risk Score and Plan: 4 or greater and Ondansetron, Dexamethasone, Midazolam and Treatment may vary due to age or medical condition  Airway Management Planned: Natural Airway and Simple Face Mask  Additional Equipment: None  Intra-op Plan:   Post-operative Plan:   Informed Consent: I have reviewed the patients History and Physical, chart, labs and discussed the procedure  including the risks, benefits and alternatives for the proposed anesthesia with the patient or authorized representative who has indicated his/her understanding and acceptance.       Plan Discussed with: CRNA  Anesthesia Plan Comments: (See PAT note 09/17/2019, Konrad Felix, PA-C)      Anesthesia Quick Evaluation

## 2019-09-18 NOTE — Progress Notes (Signed)
Anesthesia Chart Review   Case: [id number] Date/Time: 09/20/19 0815   Procedure: RIGHT TOTAL KNEE ARTHROPLASTY (Right Knee)   Anesthesia type: Spinal   Pre-op diagnosis: osteoarthritis right knee   Location: [facility] ROOM 10 / [facility]   Surgeon: Mcarthur Rossetti, MD      DISCUSSION:73 y.o. never smoker with h/o PONV, HTN, GERD, PAF (on Eliquis), HLD, sleep apnea w/o device, right knee OA scheduled for above procedure 09/20/2019 with Dr. Jean Rosenthal.   S/p left knee arthroplasty 05/31/2019 with no anesthesia complications noted.    Prior to this procedure cleared by cardiology, stable since that time.    Pt cleared by cardiology 04/29/2019.  Per Daune Perch, NP, "[name] last seen on 2/18/2020by Dr. Martinique. Since that day, [name] done well.She has hx of PAF controlled with Rythmol. She has had no recent recurrences. She has no history of documented CAD or stroke. No diabetes or renal disease.  Prior to her knee pain she was doing all of her housework and yardwork until a couple of weeks ago. She was having no chest discomfort or shortness of breath. She is now using a walker due to knee pain. According to the RCRI she is low risk for major cardiac event perioperatively. Therefore, based on ACC/AHA guidelines, the patient would be at acceptable risk for the planned procedure without further cardiovascular testing. Per office protocol, patient can hold Eliquis for 3 days prior to procedure."  Anticipate pt can proceed with planned procedure barring acute status change. VS: BP (!) 144/71 (BP Location: Right Arm)   Pulse (!) 58   Temp 36.6 C (Oral)   Resp 18   Ht 5\' 3"  (1.6 m)   Wt 82.6 kg   SpO2 100%   BMI 32.24 kg/m   PROVIDERS: Lucille Passy, MD is PCP   Martinique, Peter, MD is Cardiologist  LABS: Labs reviewed: Acceptable for surgery. (all labs ordered are listed, but only abnormal results are displayed)  Labs Reviewed  BASIC METABOLIC PANEL  - Abnormal; Notable for the following components:      Result Value   BUN 29 (*)    Creatinine, Ser 1.06 (*)    GFR calc non Af Amer 51 (*)    GFR calc Af Amer 59 (*)    All other components within normal limits  SURGICAL PCR SCREEN  CBC     IMAGES:   EKG: 12/25/2018 Rate 55 bpm Sinus bradycardia  CV: Myocardial Perfusion 09/10/2014 Overall Impression:  Normal stress nuclear study. Past Medical History:  Diagnosis Date  . Acute upper respiratory infections of unspecified site   . Arthritis   . Carotid bruit   . Carotid disease, bilateral ([place]) 01/31/2012   Mild noted on US carotid  . Cataracts, both eyes   . Chronic insomnia   . Diverticulosis    pt denies  . Dysrhythmia    a fib  . Fatty liver   . GERD (gastroesophageal reflux disease)   . Heart murmur    Has been told by an MD.  . Hiatal hernia    Denies  . History of colon polyps    Noted on Colonscopy  . History of gallstones   . Hyperlipidemia   . Hypertension   . Insomnia, controlled 01/01/2014  . Internal hemorrhoids   . Macular pucker, left eye    surgery planned 01-14-15  . Menopause    noticed in early 62's  . MR (mitral regurgitation) 09/10/2014  Mild, Noted on ECHO   . Other acute sinusitis   . Other malaise and fatigue   . Paroxysmal atrial fibrillation (HCC)    on meds  . Pneumonia    as a small child  . PONV (postoperative nausea and vomiting)   . Pulmonary hypertension ([place]) 09/10/2014   Mild, Noted on ECHO  . Pure hypercholesterolemia   . Restless leg syndrome   . Routine general medical examination at a health care facility   . Screening for lipoid disorders   . Sleep apnea    no c-pap  . Special screening for malignant neoplasms, colon   . Uterine fibroid 09/19/1999   Noted on pelvis ultrasound    Past Surgical History:  Procedure Laterality Date  . cardiolyte neg  6/06  . cardiovascular stress MRI  12/20/06   afib  . CHOLECYSTECTOMY    . COLONOSCOPY    . EYE SURGERY      cataract   Toric implant bil eyes  . KNEE ARTHROSCOPY     left  . ORIF- L distal radius  9/07   left wrist  . PARS PLANA VITRECTOMY W/ REPAIR OF MACULAR HOLE     left eye  . torn meniscus     bil knees    . TOTAL KNEE ARTHROPLASTY Left 05/31/2019   Procedure: LEFT TOTAL KNEE ARTHROPLASTY;  Surgeon: Mcarthur Rossetti, MD;  Location: [facility];  Service: Orthopedics;  Laterality: Left;  . TUBAL LIGATION    . UPPER GI ENDOSCOPY      MEDICATIONS: . acetaminophen (TYLENOL) 500 MG tablet  . celecoxib (CELEBREX) 100 MG capsule  . cyclobenzaprine (FLEXERIL) 5 MG tablet  . ELIQUIS 5 MG TABS tablet  . losartan (COZAAR) 100 MG tablet  . losartan (COZAAR) 50 MG tablet  . methocarbamol (ROBAXIN) 500 MG tablet  . metoprolol succinate (TOPROL-XL) 50 MG 24 hr tablet  . ondansetron (ZOFRAN ODT) 4 MG disintegrating tablet  . oxyCODONE (OXY IR/ROXICODONE) 5 MG immediate release tablet  . Polyethyl Glycol-Propyl Glycol (SYSTANE OP)  . propafenone (RYTHMOL) 300 MG tablet  . traMADol (ULTRAM) 50 MG tablet  . traZODone (DESYREL) 50 MG tablet  . zolpidem (AMBIEN) 10 MG tablet   No current facility-administered medications for this encounter.     [name] [facility] Pre-Surgical Testing [phone] 09/18/19  2:08 PM

## 2019-09-19 ENCOUNTER — Telehealth: Payer: Self-pay | Admitting: *Deleted

## 2019-09-19 LAB — NOVEL CORONAVIRUS, NAA (HOSP ORDER, SEND-OUT TO REF LAB; TAT 18-24 HRS): SARS-CoV-2, NAA: NOT DETECTED

## 2019-09-19 NOTE — Care Plan (Signed)
RNCM spoke with patient regarding Ortho bundle and upcoming Right TKA with Dr. Ninfa Linden on 09/20/19. All questions answered. Patient is familiar with bundle as she had her Left TKA done in July 2020. She has all DME needed post-surgery. She has a daughter that will be assisting after discharge home. Anticipate HHPT after brief hospital stay. Referral made to [facility] as she had them before with her other surgery. Also already scheduled to begin OPPT with [facility] on 10/08/19 at 12:30 pm. Will continue to follow for CM needs.

## 2019-09-19 NOTE — Telephone Encounter (Signed)
Pre-op Ortho bundle call completed. 

## 2019-09-20 ENCOUNTER — Ambulatory Visit (HOSPITAL_COMMUNITY): Payer: Medicare Other | Admitting: Physician Assistant

## 2019-09-20 ENCOUNTER — Observation Stay (HOSPITAL_COMMUNITY): Payer: Medicare Other

## 2019-09-20 ENCOUNTER — Encounter (HOSPITAL_COMMUNITY): Payer: Self-pay | Admitting: *Deleted

## 2019-09-20 ENCOUNTER — Other Ambulatory Visit: Payer: Self-pay

## 2019-09-20 ENCOUNTER — Encounter (HOSPITAL_COMMUNITY)
Admission: RE | Disposition: A | Discharge status: Home / Self Care | Payer: Self-pay | Source: Home / Self Care | Attending: Orthopaedic Surgery

## 2019-09-20 ENCOUNTER — Ambulatory Visit (HOSPITAL_COMMUNITY): Payer: Medicare Other | Admitting: Certified Registered Nurse Anesthetist

## 2019-09-20 ENCOUNTER — Observation Stay (HOSPITAL_COMMUNITY)
Admission: RE | Admit: 2019-09-20 | Discharge: 2019-09-21 | Disposition: A | Discharge status: Home / Self Care | Payer: Medicare Other | Attending: Orthopaedic Surgery | Admitting: Orthopaedic Surgery

## 2019-09-20 DIAGNOSIS — I480 Paroxysmal atrial fibrillation: Secondary | ICD-10-CM | POA: Insufficient documentation

## 2019-09-20 DIAGNOSIS — Z471 Aftercare following joint replacement surgery: Secondary | ICD-10-CM | POA: Diagnosis not present

## 2019-09-20 DIAGNOSIS — I10 Essential (primary) hypertension: Secondary | ICD-10-CM | POA: Insufficient documentation

## 2019-09-20 DIAGNOSIS — Z8249 Family history of ischemic heart disease and other diseases of the circulatory system: Secondary | ICD-10-CM | POA: Insufficient documentation

## 2019-09-20 DIAGNOSIS — F5104 Psychophysiologic insomnia: Secondary | ICD-10-CM | POA: Insufficient documentation

## 2019-09-20 DIAGNOSIS — K219 Gastro-esophageal reflux disease without esophagitis: Secondary | ICD-10-CM | POA: Insufficient documentation

## 2019-09-20 DIAGNOSIS — K449 Diaphragmatic hernia without obstruction or gangrene: Secondary | ICD-10-CM | POA: Insufficient documentation

## 2019-09-20 DIAGNOSIS — Z79818 Long term (current) use of other agents affecting estrogen receptors and estrogen levels: Secondary | ICD-10-CM | POA: Insufficient documentation

## 2019-09-20 DIAGNOSIS — M1711 Unilateral primary osteoarthritis, right knee: Secondary | ICD-10-CM | POA: Diagnosis not present

## 2019-09-20 DIAGNOSIS — F419 Anxiety disorder, unspecified: Secondary | ICD-10-CM | POA: Insufficient documentation

## 2019-09-20 DIAGNOSIS — Z800 Family history of malignant neoplasm of digestive organs: Secondary | ICD-10-CM | POA: Insufficient documentation

## 2019-09-20 DIAGNOSIS — G4733 Obstructive sleep apnea (adult) (pediatric): Secondary | ICD-10-CM | POA: Insufficient documentation

## 2019-09-20 DIAGNOSIS — E7800 Pure hypercholesterolemia, unspecified: Secondary | ICD-10-CM | POA: Insufficient documentation

## 2019-09-20 DIAGNOSIS — I272 Pulmonary hypertension, unspecified: Secondary | ICD-10-CM | POA: Insufficient documentation

## 2019-09-20 DIAGNOSIS — Z9049 Acquired absence of other specified parts of digestive tract: Secondary | ICD-10-CM | POA: Diagnosis not present

## 2019-09-20 DIAGNOSIS — G709 Myoneural disorder, unspecified: Secondary | ICD-10-CM | POA: Diagnosis not present

## 2019-09-20 DIAGNOSIS — Z885 Allergy status to narcotic agent status: Secondary | ICD-10-CM | POA: Insufficient documentation

## 2019-09-20 DIAGNOSIS — Z6832 Body mass index (BMI) 32.0-32.9, adult: Secondary | ICD-10-CM | POA: Diagnosis not present

## 2019-09-20 DIAGNOSIS — Z884 Allergy status to anesthetic agent status: Secondary | ICD-10-CM | POA: Diagnosis not present

## 2019-09-20 DIAGNOSIS — F329 Major depressive disorder, single episode, unspecified: Secondary | ICD-10-CM | POA: Insufficient documentation

## 2019-09-20 DIAGNOSIS — Z803 Family history of malignant neoplasm of breast: Secondary | ICD-10-CM | POA: Insufficient documentation

## 2019-09-20 DIAGNOSIS — E785 Hyperlipidemia, unspecified: Secondary | ICD-10-CM | POA: Diagnosis not present

## 2019-09-20 DIAGNOSIS — Z96651 Presence of right artificial knee joint: Secondary | ICD-10-CM | POA: Diagnosis not present

## 2019-09-20 DIAGNOSIS — Z96652 Presence of left artificial knee joint: Secondary | ICD-10-CM | POA: Diagnosis not present

## 2019-09-20 DIAGNOSIS — G8918 Other acute postprocedural pain: Secondary | ICD-10-CM | POA: Diagnosis not present

## 2019-09-20 DIAGNOSIS — G2581 Restless legs syndrome: Secondary | ICD-10-CM | POA: Diagnosis not present

## 2019-09-20 DIAGNOSIS — Z833 Family history of diabetes mellitus: Secondary | ICD-10-CM | POA: Insufficient documentation

## 2019-09-20 DIAGNOSIS — I340 Nonrheumatic mitral (valve) insufficiency: Secondary | ICD-10-CM | POA: Insufficient documentation

## 2019-09-20 DIAGNOSIS — K760 Fatty (change of) liver, not elsewhere classified: Secondary | ICD-10-CM | POA: Insufficient documentation

## 2019-09-20 HISTORY — PX: TOTAL KNEE ARTHROPLASTY: SHX125

## 2019-09-20 LAB — PROTIME-INR
INR: 1.2 (ref 0.8–1.2)
Prothrombin Time: 15.4 seconds — ABNORMAL HIGH (ref 11.4–15.2)

## 2019-09-20 SURGERY — ARTHROPLASTY, KNEE, TOTAL
Anesthesia: Spinal | Site: Knee | Laterality: Right

## 2019-09-20 MED ORDER — PROMETHAZINE HCL 25 MG/ML IJ SOLN
6.2500 mg | INTRAMUSCULAR | Status: DC | PRN
  Administered 2019-09-20: 11:00:00 6.25 mg via INTRAVENOUS

## 2019-09-20 MED ORDER — ACETAMINOPHEN 325 MG PO TABS: 325.0000 mg | ORAL_TABLET | Freq: Once | ORAL | Status: DC | PRN

## 2019-09-20 MED ORDER — DIPHENHYDRAMINE HCL 12.5 MG/5ML PO ELIX: 12.5000 mg | ORAL_SOLUTION | ORAL | Status: DC | PRN

## 2019-09-20 MED ORDER — LACTATED RINGERS IV SOLN
INTRAVENOUS | Status: DC
  Administered 2019-09-20 (×2): via INTRAVENOUS

## 2019-09-20 MED ORDER — METHOCARBAMOL 500 MG IVPB - SIMPLE MED
500.0000 mg | Freq: Four times a day (QID) | INTRAVENOUS | Status: DC | PRN
  Administered 2019-09-20: 11:00:00 500 mg via INTRAVENOUS
  Filled 2019-09-20: qty 50

## 2019-09-20 MED ORDER — LACTATED RINGERS IV SOLN: INTRAVENOUS | Status: DC

## 2019-09-20 MED ORDER — ONDANSETRON HCL 4 MG/2ML IJ SOLN
INTRAMUSCULAR | Status: AC
  Filled 2019-09-20: qty 2

## 2019-09-20 MED ORDER — PHENYLEPHRINE HCL (PRESSORS) 10 MG/ML IV SOLN
INTRAVENOUS | Status: AC
  Filled 2019-09-20: qty 1

## 2019-09-20 MED ORDER — KETOROLAC TROMETHAMINE 15 MG/ML IJ SOLN
7.5000 mg | Freq: Four times a day (QID) | INTRAMUSCULAR | Status: AC
  Administered 2019-09-20 – 2019-09-21 (×4): 7.5 mg via INTRAVENOUS
  Filled 2019-09-20 (×5): qty 1

## 2019-09-20 MED ORDER — PANTOPRAZOLE SODIUM 40 MG PO TBEC
40.0000 mg | DELAYED_RELEASE_TABLET | Freq: Every day | ORAL | Status: DC
  Administered 2019-09-21: 40 mg via ORAL
  Filled 2019-09-20: qty 1

## 2019-09-20 MED ORDER — METOCLOPRAMIDE HCL 5 MG PO TABS: 5.0000 mg | ORAL_TABLET | Freq: Three times a day (TID) | ORAL | Status: DC | PRN

## 2019-09-20 MED ORDER — OXYCODONE HCL 5 MG PO TABS
5.0000 mg | ORAL_TABLET | ORAL | Status: DC | PRN
  Administered 2019-09-20: 10 mg via ORAL
  Administered 2019-09-20: 5 mg via ORAL
  Filled 2019-09-20: qty 1
  Filled 2019-09-20: qty 2

## 2019-09-20 MED ORDER — PROPAFENONE HCL 150 MG PO TABS
300.0000 mg | ORAL_TABLET | Freq: Two times a day (BID) | ORAL | Status: DC
  Administered 2019-09-20 – 2019-09-21 (×2): 300 mg via ORAL
  Filled 2019-09-20 (×3): qty 2

## 2019-09-20 MED ORDER — DOCUSATE SODIUM 100 MG PO CAPS
100.0000 mg | ORAL_CAPSULE | Freq: Two times a day (BID) | ORAL | Status: DC
  Administered 2019-09-20 – 2019-09-21 (×2): 100 mg via ORAL
  Filled 2019-09-20 (×2): qty 1

## 2019-09-20 MED ORDER — ACETAMINOPHEN 325 MG PO TABS: 325.0000 mg | ORAL_TABLET | Freq: Four times a day (QID) | ORAL | Status: DC | PRN

## 2019-09-20 MED ORDER — PROPOFOL 10 MG/ML IV BOLUS
INTRAVENOUS | Status: DC | PRN
  Administered 2019-09-20: 10 mg via INTRAVENOUS
  Administered 2019-09-20: 20 mg via INTRAVENOUS
  Administered 2019-09-20 (×2): 10 mg via INTRAVENOUS

## 2019-09-20 MED ORDER — MEPERIDINE HCL 50 MG/ML IJ SOLN: 6.2500 mg | INTRAMUSCULAR | Status: DC | PRN

## 2019-09-20 MED ORDER — HYDROMORPHONE HCL 1 MG/ML IJ SOLN
0.5000 mg | INTRAMUSCULAR | Status: DC | PRN
  Administered 2019-09-21 (×2): 1 mg via INTRAVENOUS
  Filled 2019-09-20 (×2): qty 1

## 2019-09-20 MED ORDER — SODIUM CHLORIDE 0.9 % IR SOLN
Status: DC | PRN
  Administered 2019-09-20: 1000 mL

## 2019-09-20 MED ORDER — BUPIVACAINE HCL (PF) 0.25 % IJ SOLN
INTRAMUSCULAR | Status: AC
  Filled 2019-09-20: qty 30

## 2019-09-20 MED ORDER — ROPIVACAINE HCL 7.5 MG/ML IJ SOLN
INTRAMUSCULAR | Status: DC | PRN
  Administered 2019-09-20: 20 mL via PERINEURAL

## 2019-09-20 MED ORDER — ONDANSETRON HCL 4 MG/2ML IJ SOLN: 4.0000 mg | Freq: Four times a day (QID) | INTRAMUSCULAR | Status: DC | PRN

## 2019-09-20 MED ORDER — ALUM & MAG HYDROXIDE-SIMETH 200-200-20 MG/5ML PO SUSP: 30.0000 mL | ORAL | Status: DC | PRN

## 2019-09-20 MED ORDER — HYDROMORPHONE HCL 1 MG/ML IJ SOLN
INTRAMUSCULAR | Status: AC
  Administered 2019-09-20: 0.25 mg via INTRAVENOUS
  Filled 2019-09-20: qty 1

## 2019-09-20 MED ORDER — ZOLPIDEM TARTRATE 5 MG PO TABS: 5.0000 mg | ORAL_TABLET | Freq: Every evening | ORAL | Status: DC | PRN

## 2019-09-20 MED ORDER — ONDANSETRON HCL 4 MG PO TABS: 4.0000 mg | ORAL_TABLET | Freq: Four times a day (QID) | ORAL | Status: DC | PRN

## 2019-09-20 MED ORDER — PHENYLEPHRINE HCL-NACL 10-0.9 MG/250ML-% IV SOLN
INTRAVENOUS | Status: DC | PRN
  Administered 2019-09-20: 30 ug/min via INTRAVENOUS

## 2019-09-20 MED ORDER — HYDROMORPHONE HCL 1 MG/ML IJ SOLN
0.2500 mg | INTRAMUSCULAR | Status: DC | PRN
  Administered 2019-09-20: 0.5 mg via INTRAVENOUS
  Administered 2019-09-20 (×2): 0.25 mg via INTRAVENOUS

## 2019-09-20 MED ORDER — ONDANSETRON HCL 4 MG/2ML IJ SOLN
INTRAMUSCULAR | Status: DC | PRN
  Administered 2019-09-20: 4 mg via INTRAVENOUS

## 2019-09-20 MED ORDER — POVIDONE-IODINE 10 % EX SWAB
2.0000 "application " | Freq: Once | CUTANEOUS | Status: AC
  Administered 2019-09-20: 2 via TOPICAL

## 2019-09-20 MED ORDER — METHOCARBAMOL 500 MG PO TABS
500.0000 mg | ORAL_TABLET | Freq: Four times a day (QID) | ORAL | Status: DC | PRN
  Administered 2019-09-21: 500 mg via ORAL
  Filled 2019-09-20: qty 1

## 2019-09-20 MED ORDER — MIDAZOLAM HCL 2 MG/2ML IJ SOLN
1.0000 mg | Freq: Once | INTRAMUSCULAR | Status: DC
  Filled 2019-09-20: qty 2

## 2019-09-20 MED ORDER — MENTHOL 3 MG MT LOZG: 1.0000 | LOZENGE | OROMUCOSAL | Status: DC | PRN

## 2019-09-20 MED ORDER — PROPOFOL 500 MG/50ML IV EMUL
INTRAVENOUS | Status: DC | PRN
  Administered 2019-09-20: 35 ug/kg/min via INTRAVENOUS

## 2019-09-20 MED ORDER — DEXAMETHASONE SODIUM PHOSPHATE 10 MG/ML IJ SOLN
INTRAMUSCULAR | Status: DC | PRN
  Administered 2019-09-20: 10 mg via INTRAVENOUS

## 2019-09-20 MED ORDER — PROMETHAZINE HCL 25 MG/ML IJ SOLN
INTRAMUSCULAR | Status: AC
  Administered 2019-09-20: 6.25 mg via INTRAVENOUS
  Filled 2019-09-20: qty 1

## 2019-09-20 MED ORDER — SODIUM CHLORIDE 0.9 % IV SOLN
INTRAVENOUS | Status: DC
  Administered 2019-09-20 – 2019-09-21 (×2): via INTRAVENOUS

## 2019-09-20 MED ORDER — APIXABAN 5 MG PO TABS
5.0000 mg | ORAL_TABLET | Freq: Two times a day (BID) | ORAL | Status: DC
  Administered 2019-09-21: 5 mg via ORAL
  Filled 2019-09-20: qty 1

## 2019-09-20 MED ORDER — DEXAMETHASONE SODIUM PHOSPHATE 10 MG/ML IJ SOLN
INTRAMUSCULAR | Status: AC
  Filled 2019-09-20: qty 1

## 2019-09-20 MED ORDER — METHOCARBAMOL 500 MG IVPB - SIMPLE MED
INTRAVENOUS | Status: AC
  Administered 2019-09-20: 500 mg via INTRAVENOUS
  Filled 2019-09-20: qty 50

## 2019-09-20 MED ORDER — 0.9 % SODIUM CHLORIDE (POUR BTL) OPTIME
TOPICAL | Status: DC | PRN
  Administered 2019-09-20: 1000 mL

## 2019-09-20 MED ORDER — METOPROLOL SUCCINATE ER 50 MG PO TB24
50.0000 mg | ORAL_TABLET | Freq: Every day | ORAL | Status: DC
  Administered 2019-09-20: 50 mg via ORAL
  Filled 2019-09-20: qty 1

## 2019-09-20 MED ORDER — BUPIVACAINE IN DEXTROSE 0.75-8.25 % IT SOLN
INTRATHECAL | Status: DC | PRN
  Administered 2019-09-20: 1.6 mL via INTRATHECAL

## 2019-09-20 MED ORDER — FENTANYL CITRATE (PF) 100 MCG/2ML IJ SOLN
50.0000 ug | Freq: Once | INTRAMUSCULAR | Status: AC
  Administered 2019-09-20: 25 ug via INTRAVENOUS
  Filled 2019-09-20: qty 2

## 2019-09-20 MED ORDER — OXYCODONE HCL 5 MG PO TABS
10.0000 mg | ORAL_TABLET | ORAL | Status: DC | PRN
  Administered 2019-09-21: 10 mg via ORAL
  Filled 2019-09-20: qty 2

## 2019-09-20 MED ORDER — METOCLOPRAMIDE HCL 5 MG/ML IJ SOLN
5.0000 mg | Freq: Three times a day (TID) | INTRAMUSCULAR | Status: DC | PRN
  Administered 2019-09-20: 10 mg via INTRAVENOUS
  Filled 2019-09-20: qty 2

## 2019-09-20 MED ORDER — LOSARTAN POTASSIUM 50 MG PO TABS
100.0000 mg | ORAL_TABLET | Freq: Every day | ORAL | Status: DC
  Administered 2019-09-20 – 2019-09-21 (×2): 100 mg via ORAL
  Filled 2019-09-20 (×2): qty 2

## 2019-09-20 MED ORDER — PROMETHAZINE HCL 25 MG PO TABS: 12.5000 mg | ORAL_TABLET | Freq: Four times a day (QID) | ORAL | Status: DC | PRN

## 2019-09-20 MED ORDER — BUPIVACAINE HCL (PF) 0.25 % IJ SOLN
INTRAMUSCULAR | Status: DC | PRN
  Administered 2019-09-20: 30 mL

## 2019-09-20 MED ORDER — ACETAMINOPHEN 10 MG/ML IV SOLN
1000.0000 mg | Freq: Once | INTRAVENOUS | Status: DC | PRN
  Administered 2019-09-20: 1000 mg via INTRAVENOUS

## 2019-09-20 MED ORDER — TRANEXAMIC ACID-NACL 1000-0.7 MG/100ML-% IV SOLN
1000.0000 mg | INTRAVENOUS | Status: AC
  Administered 2019-09-20: 1000 mg via INTRAVENOUS
  Filled 2019-09-20: qty 100

## 2019-09-20 MED ORDER — CEFAZOLIN SODIUM-DEXTROSE 1-4 GM/50ML-% IV SOLN
1.0000 g | Freq: Four times a day (QID) | INTRAVENOUS | Status: AC
  Administered 2019-09-20 (×2): 1 g via INTRAVENOUS
  Filled 2019-09-20 (×2): qty 50

## 2019-09-20 MED ORDER — ACETAMINOPHEN 10 MG/ML IV SOLN
INTRAVENOUS | Status: AC
  Filled 2019-09-20: qty 100

## 2019-09-20 MED ORDER — PROPOFOL 10 MG/ML IV BOLUS
INTRAVENOUS | Status: AC
  Filled 2019-09-20: qty 20

## 2019-09-20 MED ORDER — PHENOL 1.4 % MT LIQD: 1.0000 | OROMUCOSAL | Status: DC | PRN

## 2019-09-20 MED ORDER — GABAPENTIN 100 MG PO CAPS
100.0000 mg | ORAL_CAPSULE | Freq: Three times a day (TID) | ORAL | Status: DC
  Administered 2019-09-20 – 2019-09-21 (×4): 100 mg via ORAL
  Filled 2019-09-20 (×4): qty 1

## 2019-09-20 MED ORDER — ACETAMINOPHEN 160 MG/5ML PO SOLN: 325.0000 mg | Freq: Once | ORAL | Status: DC | PRN

## 2019-09-20 MED ORDER — CEFAZOLIN SODIUM-DEXTROSE 2-4 GM/100ML-% IV SOLN
2.0000 g | INTRAVENOUS | Status: AC
  Administered 2019-09-20: 2 g via INTRAVENOUS
  Filled 2019-09-20: qty 100

## 2019-09-20 SURGICAL SUPPLY — 59 items
APL SKNCLS STERI-STRIP NONHPOA (GAUZE/BANDAGES/DRESSINGS)
BAG SPEC THK2 15X12 ZIP CLS (MISCELLANEOUS)
BAG ZIPLOCK 12X15 (MISCELLANEOUS) IMPLANT
BASEPLATE TIBIAL TRIATHALON 2 (Plate) ×2 IMPLANT
BENZOIN TINCTURE PRP APPL 2/3 (GAUZE/BANDAGES/DRESSINGS) IMPLANT
BLADE SAG 18X100X1.27 (BLADE) ×2 IMPLANT
BLADE SURG SZ10 CARB STEEL (BLADE) ×6 IMPLANT
BNDG ELASTIC 6X5.8 VLCR STR LF (GAUZE/BANDAGES/DRESSINGS) ×5 IMPLANT
BOWL SMART MIX CTS (DISPOSABLE) IMPLANT
BSPLAT TIB 2 CMNT PRM STRL KN (Plate) ×1 IMPLANT
CEMENT BONE SIMPLEX SPEEDSET (Cement) ×4 IMPLANT
CLOSURE WOUND 1/2 X4 (GAUZE/BANDAGES/DRESSINGS) ×2
COVER SURGICAL LIGHT HANDLE (MISCELLANEOUS) ×3 IMPLANT
COVER WAND RF STERILE (DRAPES) IMPLANT
CUFF TOURN SGL QUICK 34 (TOURNIQUET CUFF) ×3
CUFF TRNQT CYL 34X4.125X (TOURNIQUET CUFF) ×1 IMPLANT
DECANTER SPIKE VIAL GLASS SM (MISCELLANEOUS) ×2 IMPLANT
DRAPE U-SHAPE 47X51 STRL (DRAPES) ×3 IMPLANT
DRSG PAD ABDOMINAL 8X10 ST (GAUZE/BANDAGES/DRESSINGS) ×6 IMPLANT
DURAPREP 26ML APPLICATOR (WOUND CARE) ×3 IMPLANT
ELECT REM PT RETURN 15FT ADLT (MISCELLANEOUS) ×3 IMPLANT
FEMORAL PEG DISTAL FIXATION (Orthopedic Implant) ×2 IMPLANT
FEMORAL POST STABILIZED NO 3 (Orthopedic Implant) ×2 IMPLANT
GAUZE SPONGE 4X4 12PLY STRL (GAUZE/BANDAGES/DRESSINGS) ×3 IMPLANT
GAUZE XEROFORM 1X8 LF (GAUZE/BANDAGES/DRESSINGS) ×2 IMPLANT
GLOVE BIO SURGEON STRL SZ7.5 (GLOVE) ×3 IMPLANT
GLOVE BIOGEL PI IND STRL 8 (GLOVE) ×2 IMPLANT
GLOVE BIOGEL PI INDICATOR 8 (GLOVE) ×4
GLOVE ECLIPSE 8.0 STRL XLNG CF (GLOVE) ×3 IMPLANT
GOWN STRL REUS W/TWL XL LVL3 (GOWN DISPOSABLE) ×6 IMPLANT
HANDPIECE INTERPULSE COAX TIP (DISPOSABLE) ×3
HOLDER FOLEY CATH W/STRAP (MISCELLANEOUS) ×2 IMPLANT
IMMOBILIZER KNEE 20 (SOFTGOODS) ×3
IMMOBILIZER KNEE 20 THIGH 36 (SOFTGOODS) ×1 IMPLANT
INSERT TIB BEAR PS SZ 2 13 (Miscellaneous) ×2 IMPLANT
INSERT TIB BEARING SZ 2 11 (Knees) ×2 IMPLANT
KIT TURNOVER KIT A (KITS) IMPLANT
NS IRRIG 1000ML POUR BTL (IV SOLUTION) ×3 IMPLANT
PACK TOTAL KNEE CUSTOM (KITS) ×3 IMPLANT
PADDING CAST COTTON 6X4 STRL (CAST SUPPLIES) ×6 IMPLANT
PATELLA TRIATHLON SZ 29 9 MM (Orthopedic Implant) ×2 IMPLANT
PIN FLUTED HEDLESS FIX 3.5X1/8 (PIN) ×2 IMPLANT
PROTECTOR NERVE ULNAR (MISCELLANEOUS) ×3 IMPLANT
SET HNDPC FAN SPRY TIP SCT (DISPOSABLE) ×1 IMPLANT
SET PAD KNEE POSITIONER (MISCELLANEOUS) ×3 IMPLANT
STAPLER VISISTAT 35W (STAPLE) IMPLANT
STRIP CLOSURE SKIN 1/2X4 (GAUZE/BANDAGES/DRESSINGS) ×2 IMPLANT
SUT MNCRL AB 4-0 PS2 18 (SUTURE) IMPLANT
SUT VIC AB 0 CT1 27 (SUTURE) ×3
SUT VIC AB 0 CT1 27XBRD ANTBC (SUTURE) ×1 IMPLANT
SUT VIC AB 1 CT1 36 (SUTURE) ×6 IMPLANT
SUT VIC AB 2-0 CT1 27 (SUTURE) ×6
SUT VIC AB 2-0 CT1 TAPERPNT 27 (SUTURE) ×2 IMPLANT
TIBIAL BEARING INSERT-PS (Orthopedic Implant) ×2 IMPLANT
TRAY FOLEY MTR SLVR 14FR STAT (SET/KITS/TRAYS/PACK) ×2 IMPLANT
TRAY FOLEY MTR SLVR 16FR STAT (SET/KITS/TRAYS/PACK) ×1 IMPLANT
WATER STERILE IRR 1000ML POUR (IV SOLUTION) ×5 IMPLANT
WRAP KNEE MAXI GEL POST OP (GAUZE/BANDAGES/DRESSINGS) ×3 IMPLANT
YANKAUER SUCT BULB TIP 10FT TU (MISCELLANEOUS) ×3 IMPLANT

## 2019-09-20 NOTE — Transfer of Care (Signed)
Immediate Anesthesia Transfer of Care Note  Patient: SANELA MOGEL  Procedure(s) Performed: RIGHT TOTAL KNEE ARTHROPLASTY (Right Knee)  Patient Location: PACU  Anesthesia Type:Spinal  Level of Consciousness: drowsy and patient cooperative  Airway & Oxygen Therapy: Patient Spontanous Breathing and Patient connected to face mask oxygen  Post-op Assessment: Report given to RN and Post -op Vital signs reviewed and stable  Post vital signs: Reviewed and stable  Last Vitals:  Vitals Value Taken Time  BP 144/76 09/20/19 1045  Temp    Pulse 58 09/20/19 1046  Resp 15 09/20/19 1046  SpO2 100 % 09/20/19 1046  Vitals shown include unvalidated device data.  Last Pain:  Vitals:   09/20/19 0629  TempSrc: Oral         Complications: No apparent anesthesia complications

## 2019-09-20 NOTE — Anesthesia Procedure Notes (Signed)
Anesthesia Regional Block: Adductor canal block   Pre-Anesthetic Checklist: ,, timeout performed, Correct Patient, Correct Site, Correct Laterality, Correct Procedure, Correct Position, site marked, Risks and benefits discussed,  Surgical consent,  Pre-op evaluation,  At surgeon's request and post-op pain management  Laterality: Right  Prep: chloraprep       Needles:  Injection technique: Single-shot  Needle Type: Echogenic Stimulator Needle     Needle Length: 9cm  Needle Gauge: 21     Additional Needles:   Procedures:,,,, ultrasound used (permanent image in chart),,,,  Narrative:  Start time: 09/20/2019 7:55 AM End time: 09/20/2019 8:05 AM Injection made incrementally with aspirations every 5 mL.  Performed by: Personally  Anesthesiologist: Effie Berkshire, MD  Additional Notes: Patient tolerated the procedure well. Local anesthetic introduced in an incremental fashion under minimal resistance after negative aspirations. No paresthesias were elicited. After completion of the procedure, no acute issues were identified and patient continued to be monitored by RN.

## 2019-09-20 NOTE — Op Note (Signed)
NAME: [name], [name] MEDICAL RECORD [id number] ACCOUNT [id number] DATE OF BIRTH:[birth year] FACILITY: [facility] LOCATION: [facility] PHYSICIAN:Der Gagliano Kerry Fort, MD  OPERATIVE REPORT  DATE OF PROCEDURE:  09/20/2019  PREOPERATIVE DIAGNOSIS:  Primary osteoarthritis and degenerative joint disease, right knee.  POSTOPERATIVE DIAGNOSIS:  Primary osteoarthritis and degenerative joint disease, right knee.  PROCEDURE:  Right total knee arthroplasty.  IMPLANTS:  Stryker Triathlon cemented knee system with size 3 femur, size 2 tibial tray, 13 mm fixed-bearing polyethylene insert, size 29 patellar button.  SURGEON:  Jonn Shingles, MD  ASSISTANT:  Erskine Emery, PA-C.  ANESTHESIA:   1.  Right lower extremity adductor canal block 2.  Spinal. 3.  Local intra-articular injection with 0.25% plain Marcaine.  ESTIMATED BLOOD LOSS:  Less  than 100 mL.  TOURNIQUET TIME:  65 minutes.  COMPLICATIONS:  None.  INDICATIONS:  The patient is a 73 year old female well known to me.  She has debilitating arthritis involving both her knees.  In July of this year, she underwent a successful left total knee arthroplasty.  She now wished to have her right knee replaced.   She does have severe arthritis in the right knee.  She has tried and failed conservative treatment for the right knee as well for over a year.  At this point, her pain is daily and it is detrimentally affecting her mobility, activities of daily living  and her quality of life.  She does wish to proceed with total knee arthroplasty.  We agree with this.  Having had this done before, she is fully aware of the risk of acute blood loss anemia, nerve or vessel injury, fracture, infection and DVT.  She  understands our goals are to decrease pain, improve mobility and overall improve quality of life.  DESCRIPTION OF PROCEDURE:  After informed consent was obtained, appropriate right knee was marked.  An adductor canal block was obtained  in the holding room.  She was then brought to the operating room and sat up on the operating table.  Spinal  anesthesia was then obtained.  She was then laid in the supine position on the operating table.  Foley catheter was placed and nonsterile tourniquet was placed around her upper right thigh.  Her right thigh, knee, leg and ankle were prepped and draped  with DuraPrep and sterile drapes, including a sterile stockinette.  Time-out was called to identify correct patient and correct right knee.  We then used an Esmarch to wrap that leg and tourniquet was inflated to 300 mm of pressure.  I then made a direct  incision over the patella, carried this proximally and distally, dissected down to the knee joint, carried out a medial parapatellar arthrotomy, finding a very large joint effusion and severe arthritis in her knee.  With the knee in a flexed position,  we removed remnants of the ACL, PCL, medial and lateral meniscus.  We removed periarticular osteophytes around the knee.  We then set our extramedullary cutting guide for right knee, taking 9 mm off the high side.  We set this for a neutral slope as well  and corrected for varus and valgus.  We made this cut without difficulty, but seeing how much sclerotic bone she still had, I did back this down 2 more millimeters.  We then went to the femur and used an intramedullary cutting guide for the femur,  setting this for right knee at 5 degrees externally rotated and for an 8 mm distal femoral cut.  We made this cut without  difficulty.  We brought the knee back down to full extension with a 9 mm extension block and achieved full extension.  We then went  back to the femur and put our femoral sizing guide based off the epicondylar axis.  We chose a size 3 femur based off of that.  We put a 4-in-1 cutting block for size 3 femur.  We made our anterior and posterior cuts, followed by our chamfer cuts.  We  then made our femoral box cut.  Attention was then turned  back to the tibia.  We chose a size 2 tibia for this side.  She has a size 3 for the other side, but it seems like the coverage was better for the 2, centered rotation off the tibial tubercle and  the femur.  We then did our keel punch off of this and placed a size 2 tibial trial and then we went to a size 3 right femoral trial.  We tried a 9 mm polyethylene insert and we felt like she was stable, except for just slight hyperextension, so we felt  we would give it an 11 mm insert like the other side.  We removed all trial instrumentation, irrigated the knee with normal saline solution using pulsatile lavage.  We then irrigated the knee with normal saline solution and mixed our cement.  We then  cemented the real Stryker Triathlon tibial tray size 2, followed by real size 3 right femur.  We placed our 11 mm fixed bearing polyethylene insert.  At that point though, I did feel like she is still needed a thicker insert, so we removed that insert  and actually went with the real 13 mm polyethylene insert.  That gave her more stability.  We then cemented our patellar button as well.  We held the knee in extended position with compression on this knee while the cement hardened.  Once it had  hardened, we removed cement debris from the knee.  We irrigated the knee again with normal saline solution.  The tourniquet was let down and hemostasis obtained with electrocautery.  We then closed the arthrotomy with interrupted #1 Vicryl , followed by  0 Vicryl to close the deep tissue, 2-0 Vicryl to close the subcutaneous tissue and interrupted staples to close the skin.  Xeroform and Aquacel dressing applied.  She was taken off the operating table and taken to recovery room in stable condition.  All  final counts were correct.  There were no complications noted.  Note Benita Stabile, PA-C, assisted for the entire case.  His assistance was crucial for facilitating all aspects of this case.  VN/NUANCE  D:09/20/2019 T:09/20/2019  JOB:[id number]

## 2019-09-20 NOTE — Care Plan (Signed)
Ortho Bundle Case Management Note  Patient Details  Name: [name] MRN: [id number] Date of Birth: [birth year]   [facility] spoke with patient regarding Ortho bundle and upcoming Right TKA with Dr. Ninfa Linden on 09/20/19. All questions answered. Patient is familiar with bundle as she had her Left TKA done in July 2020. She has all DME needed post-surgery. She has a daughter that will be assisting after discharge home. Anticipate HHPT after brief hospital stay. Referral made to [facility] as she had them before with her other surgery. Choice provided. Also already scheduled to begin OPPT with [facility] on 10/08/19 at 12:30 pm. Will continue to follow for CM needs.          DME Arranged:  (Has all DME from previous Orthopedic surgery this year.) DME Agency:     [place]:  PT [place]:  [facility] (now [facility])  Additional Comments: Please contact me with any questions of if this plan should need to change.  Jamse Arn, RN, BSN, [employer]  [phone] 09/20/2019, 11:43 AM

## 2019-09-20 NOTE — H&P (Signed)
TOTAL KNEE ADMISSION H&P  Patient is being admitted for right total knee arthroplasty.  Subjective:  Chief Complaint:right knee pain.  HPI: [name], 73 y.o. female, has a history of pain and functional disability in the right knee due to arthritis and has failed non-surgical conservative treatments for greater than 12 weeks to includeNSAID's and/or analgesics, corticosteriod injections, viscosupplementation injections, flexibility and strengthening excercises, supervised PT with diminished ADL's post treatment, use of assistive devices, weight reduction as appropriate and activity modification.  Onset of symptoms was gradual, starting 3 years ago with gradually worsening course since that time. The patient noted no past surgery on the right knee(s).  Patient currently rates pain in the right knee(s) at 10 out of 10 with activity. Patient has night pain, worsening of pain with activity and weight bearing, pain that interferes with activities of daily living, pain with passive range of motion, crepitus and joint swelling.  Patient has evidence of subchondral sclerosis, periarticular osteophytes and joint space narrowing by imaging studies. There is no active infection.  Patient Active Problem List   Diagnosis Date Noted  . Psychophysiological insomnia 07/22/2019  . Status post total left knee replacement 05/31/2019  . Unilateral primary osteoarthritis, right knee 04/23/2019  . Unilateral primary osteoarthritis, left knee 04/23/2019  . Obesity due to excess calories with serious comorbidity 07/18/2018  . Chronic insomnia 07/18/2018  . Anxiety associated with depression 07/18/2018  . Morbid obesity due to excess calories ([place]) 04/25/2016  . OSA (obstructive sleep apnea) 04/25/2016  . Paroxysmal atrial fibrillation ([place]) 04/25/2016  . Obstructive sleep apnea 06/04/2015  . Insomnia w/ sleep apnea 06/04/2015  . Hypoxemia 06/04/2015  . Encounter for therapeutic drug monitoring 02/10/2015   . Insomnia, controlled 01/01/2014  . Skin lesion of breast 06/05/2013  . Urinary incontinence, mixed 06/05/2013  . Chronic anticoagulation 09/12/2012  . Atrial fibrillation ([place]) 01/10/2011  . Carotid stenosis 01/10/2011  . DYSPNEA 01/10/2011  . COLONIC POLYPS, ADENOMATOUS, HX OF 11/04/2010  . HEMORRHOIDS-INTERNAL 11/03/2010  . HIATAL HERNIA 11/03/2010  . DIVERTICULAR DISEASE 11/03/2010  . RECTAL BLEEDING 11/03/2010  . FATTY LIVER DISEASE, HX OF 11/03/2010  . HYPERCHOLESTEROLEMIA 08/10/2010  . Other fatigue 12/22/2009  . CHOLELITHIASIS 08/06/2008  . Essential hypertension 07/25/2008  . INSOMNIA, CHRONIC 05/21/2007  . RESTLESS LEG SYNDROME 05/21/2007  . SLEEP APNEA 05/21/2007   Past Medical History:  Diagnosis Date  . Acute upper respiratory infections of unspecified site   . Arthritis   . Carotid bruit   . Carotid disease, bilateral ([place]) 01/31/2012   Mild noted on US carotid  . Cataracts, both eyes   . Chronic insomnia   . Diverticulosis    pt denies  . Dysrhythmia    a fib  . Fatty liver   . GERD (gastroesophageal reflux disease)   . Heart murmur    Has been told by an MD.  . Hiatal hernia    Denies  . History of colon polyps    Noted on Colonscopy  . History of gallstones   . Hyperlipidemia   . Hypertension   . Insomnia, controlled 01/01/2014  . Internal hemorrhoids   . Macular pucker, left eye    surgery planned 01-14-15  . Menopause    noticed in early 62's  . MR (mitral regurgitation) 09/10/2014   Mild, Noted on ECHO   . Other acute sinusitis   . Other malaise and fatigue   . Paroxysmal atrial fibrillation ([facility])    on meds  . Pneumonia  as a small child  . PONV (postoperative nausea and vomiting)   . Pulmonary hypertension ([place]) 09/10/2014   Mild, Noted on ECHO  . Pure hypercholesterolemia   . Restless leg syndrome   . Routine general medical examination at a health care facility   . Screening for lipoid disorders   . Sleep apnea    no c-pap   . Special screening for malignant neoplasms, colon   . Uterine fibroid 09/19/1999   Noted on pelvis ultrasound    Past Surgical History:  Procedure Laterality Date  . cardiolyte neg  6/06  . cardiovascular stress MRI  12/20/06   afib  . CHOLECYSTECTOMY    . COLONOSCOPY    . EYE SURGERY     cataract   Toric implant bil eyes  . KNEE ARTHROSCOPY     left  . ORIF- L distal radius  9/07   left wrist  . PARS PLANA VITRECTOMY W/ REPAIR OF MACULAR HOLE     left eye  . torn meniscus     bil knees    . TOTAL KNEE ARTHROPLASTY Left 05/31/2019   Procedure: LEFT TOTAL KNEE ARTHROPLASTY;  Surgeon: Mcarthur Rossetti, MD;  Location: [facility];  Service: Orthopedics;  Laterality: Left;  . TUBAL LIGATION    . UPPER GI ENDOSCOPY      Current Facility-Administered Medications  Medication Dose Route Frequency Provider Last Rate Last Dose  . ceFAZolin (ANCEF) IVPB 2g/100 mL premix  2 g Intravenous On Call to OR Pete Pelt, PA-C      . fentaNYL (SUBLIMAZE) injection 50-100 mcg  50-100 mcg Intravenous Once Effie Berkshire, MD      . lactated ringers infusion   Intravenous Continuous Effie Berkshire, MD 50 mL/hr at 09/20/19 [phone]    . midazolam (VERSED) injection 1-2 mg  1-2 mg Intravenous Once Effie Berkshire, MD      . tranexamic acid (CYKLOKAPRON) IVPB 1,000 mg  1,000 mg Intravenous To OR Pete Pelt, PA-C       Allergies  Allergen Reactions  . Codeine Nausea And Vomiting  . Other Nausea And Vomiting    general anesthesia  . Oxycodone Nausea And Vomiting    Social History   Tobacco Use  . Smoking status: Never Smoker  . Smokeless tobacco: Never Used  Substance Use Topics  . Alcohol use: Not Currently    Comment: rarely    Family History  Problem Relation Age of Onset  . Breast cancer Mother   . Heart disease Father   . Colon cancer Cousin 2  . Diabetes Other        cousins     Review of Systems  Musculoskeletal: Positive for joint pain.  All other systems reviewed  and are negative.   Objective:  Physical Exam  Constitutional: She is oriented to person, place, and time. She appears well-developed and well-nourished.  HENT:  Head: Normocephalic and atraumatic.  Eyes: Pupils are equal, round, and reactive to light. EOM are normal.  Neck: Normal range of motion. Neck supple.  Cardiovascular: Normal rate.  Respiratory: Effort normal.  GI: Soft.  Musculoskeletal:     Right knee: She exhibits decreased range of motion, effusion, bony tenderness and abnormal meniscus. She exhibits normal alignment. Tenderness found. Medial joint line and lateral joint line tenderness noted.  Neurological: She is alert and oriented to person, place, and time.  Skin: Skin is warm and dry.  Psychiatric: She has a normal mood and affect.  Vital signs in last 24 hours: Temp:  [97.5 F (36.4 C)] 97.5 F (36.4 C) (11/13 0629) Pulse Rate:  [57] 57 (11/13 0629) Resp:  [18] 18 (11/13 0629) BP: (146)/(69) 146/69 (11/13 0629) SpO2:  [100 %] 100 % (11/13 0629)  Labs:   Estimated body mass index is 32.24 kg/m as calculated from the following:   Height as of 09/17/19: 5\' 3"  (1.6 m).   Weight as of 09/17/19: 82.6 kg.   Imaging Review Plain radiographs demonstrate severe degenerative joint disease of the right knee(s). The overall alignment ismild varus. The bone quality appears to be good for age and reported activity level.      Assessment/Plan:  End stage arthritis, right knee   The patient history, physical examination, clinical judgment of the provider and imaging studies are consistent with end stage degenerative joint disease of the right knee(s) and total knee arthroplasty is deemed medically necessary. The treatment options including medical management, injection therapy arthroscopy and arthroplasty were discussed at length. The risks and benefits of total knee arthroplasty were presented and reviewed. The risks due to aseptic loosening, infection,  stiffness, patella tracking problems, thromboembolic complications and other imponderables were discussed. The patient acknowledged the explanation, agreed to proceed with the plan and consent was signed. Patient is being admitted for inpatient treatment for surgery, pain control, PT, OT, prophylactic antibiotics, VTE prophylaxis, progressive ambulation and ADL's and discharge planning. The patient is planning to be discharged home with home health services

## 2019-09-20 NOTE — Progress Notes (Signed)
Assisted Dr. Hollis with right, ultrasound guided, adductor canal block. Side rails up, monitors on throughout procedure. See vital signs in flow sheet. Tolerated Procedure well.  

## 2019-09-20 NOTE — Anesthesia Procedure Notes (Signed)
Spinal  Patient location during procedure: OR Start time: 09/20/2019 8:31 AM End time: 09/20/2019 8:36 AM Staffing Anesthesiologist: Effie Berkshire, MD Resident/CRNA: Montel Clock, CRNA Performed: resident/CRNA  Preanesthetic Checklist Completed: patient identified, surgical consent, pre-op evaluation, timeout performed, IV checked, risks and benefits discussed and monitors and equipment checked Spinal Block Patient position: sitting Prep: DuraPrep Patient monitoring: heart rate, cardiac monitor, continuous pulse ox and blood pressure Approach: midline Location: L4-5 Injection technique: single-shot Needle Needle type: Pencan  Needle gauge: 24 G Needle length: 10 cm Needle insertion depth: 8 cm Assessment Sensory level: T6

## 2019-09-20 NOTE — Evaluation (Signed)
Physical Therapy Evaluation Patient Details Name: [name] MRN: [id number] DOB: 02/07/1946 Today's Date: 09/20/2019   History of Present Illness  Patient is 73 y.o. female s/p Rt TKA on 09/20/19 with PMH sinificant for HTN, HLD, A-fib, PAF, and OA.  Clinical Impression  ALBIRTHA STROLLO is a 73 y.o. female POD 0 s/p Rt TKA. Patient reports independence with mobility at baseline. Patient is now limited by functional impairments (see PT problem list below) and requires min assist for transfers and gait with RW. Patient was able to ambulate ~60 feet with RW and min assist to maintain safe walker management. Patient instructed in exercise to facilitate ROM and circulation. Patient will benefit from continued skilled PT interventions to address impairments and progress towards PLOF. Acute PT will follow to progress mobility and stair training in preparation for safe discharge home.    Follow Up Recommendations Follow surgeon's recommendation for DC plan and follow-up therapies    Equipment Recommendations  None recommended by PT    Recommendations for Other Services       Precautions / Restrictions Precautions Precautions: Fall Restrictions Weight Bearing Restrictions: No      Mobility  Bed Mobility Overal bed mobility: Needs Assistance Bed Mobility: Supine to Sit     Supine to sit: Min assist;HOB elevated     General bed mobility comments: cues for sequencing with bed rails, and assist to raise up  Transfers Overall transfer level: Needs assistance Equipment used: Rolling walker (2 wheeled) Transfers: Sit to/from Stand Sit to Stand: Min assist         General transfer comment: cues for hand placement and technique with RW, assist required to initiate and complete power up to stand  Ambulation/Gait Ambulation/Gait assistance: Min assist Gait Distance (Feet): 60 Feet Assistive device: Rolling walker (2 wheeled) Gait Pattern/deviations: Step-through  pattern;Decreased stride length Gait velocity: decreased   General Gait Details: cues for safe step pattern with RW and to maintain safe proximity to walker, no overt LOB noted owever assist required for walker positioning  Stairs            Wheelchair Mobility    Modified Rankin (Stroke Patients Only)       Balance Overall balance assessment: Needs assistance Sitting-balance support: Feet supported;No upper extremity supported Sitting balance-Leahy Scale: Good     Standing balance support: During functional activity;Bilateral upper extremity supported Standing balance-Leahy Scale: Poor            Pertinent Vitals/Pain Pain Assessment: No/denies pain    Home Living Family/patient expects to be discharged to:: Private residence Living Arrangements: Alone Available Help at Discharge: Family;Available 24 hours/day Type of Home: House Home Access: Stairs to enter Entrance Stairs-Rails: None Entrance Stairs-Number of Steps: 2-3 Home Layout: Two level;Able to live on main level with bedroom/bathroom;Full bath on main level Home Equipment: Walker - 2 wheels;Cane - single point;Cane - quad;Shower seat;Bedside commode Additional Comments: Daughter will assist for 1-3 weeks    Prior Function Level of Independence: Independent         Comments: pt's daughter is going to come stay with for 1-3 weeks     Hand Dominance   Dominant Hand: Right    Extremity/Trunk Assessment   Upper Extremity Assessment Upper Extremity Assessment: Overall WFL for tasks assessed    Lower Extremity Assessment Lower Extremity Assessment: RLE deficits/detail RLE Deficits / Details: quad activation good in supine, no extensor lag with SLR, 4/5 with strength or MMT RLE Sensation: WNL RLE Coordination: WNL  Cervical / Trunk Assessment Cervical / Trunk Assessment: Normal  Communication   Communication: HOH  Cognition Arousal/Alertness: Awake/alert Behavior During Therapy: WFL  for tasks assessed/performed Overall Cognitive Status: Within Functional Limits for tasks assessed         General Comments      Exercises Total Joint Exercises Ankle Circles/Pumps: AROM;15 reps;Seated;Both Quad Sets: AROM;10 reps;Supine;Right Heel Slides: AAROM;10 reps;Seated;Right   Assessment/Plan    PT Assessment Patient needs continued PT services  PT Problem List Decreased strength;Decreased range of motion;Decreased mobility;Decreased knowledge of use of DME;Decreased balance       PT Treatment Interventions DME instruction;Functional mobility training;Balance training;Therapeutic activities;Gait training;Stair training;Therapeutic exercise;Patient/family education;Modalities    PT Goals (Current goals can be found in the Care Plan section)  Acute Rehab PT Goals Patient Stated Goal: to be able to get bak to gardening PT Goal Formulation: With patient Time For Goal Achievement: 09/27/19 Potential to Achieve Goals: Good    Frequency 7X/week    AM-PAC PT "6 Clicks" Mobility  Outcome Measure Help needed turning from your back to your side while in a flat bed without using bedrails?: A Little Help needed moving from lying on your back to sitting on the side of a flat bed without using bedrails?: A Little Help needed moving to and from a bed to a chair (including a wheelchair)?: A Little Help needed standing up from a chair using your arms (e.g., wheelchair or bedside chair)?: A Little Help needed to walk in hospital room?: A Little Help needed climbing 3-5 steps with a railing? : A Little 6 Click Score: 18    End of Session Equipment Utilized During Treatment: Gait belt Activity Tolerance: Patient tolerated treatment well Patient left: in chair;with call bell/phone within reach;with chair alarm set Nurse Communication: Mobility status PT Visit Diagnosis: Muscle weakness (generalized) (M62.81);Difficulty in walking, not elsewhere classified (R26.2)    Time:  [id number] PT Time Calculation (min) (ACUTE ONLY): 26 min   Charges:   PT Evaluation $PT Eval Low Complexity: 1 Low PT Treatments $Therapeutic Exercise: 8-22 mins        Kipp Brood, PT, DPT Physical Therapist with [facility]  09/20/2019 5:58 PM

## 2019-09-20 NOTE — Brief Op Note (Signed)
09/20/2019  10:13 AM  PATIENT:  [name]  73 y.o. female  PRE-OPERATIVE DIAGNOSIS:  osteoarthritis right knee  POST-OPERATIVE DIAGNOSIS:  osteoarthritis right knee  PROCEDURE:  Procedure(s): RIGHT TOTAL KNEE ARTHROPLASTY (Right)  SURGEON:  Surgeon(s) and Role:    Mcarthur Rossetti, MD - Primary  PHYSICIAN ASSISTANT: Benita Stabile, PA-C assisted  ANESTHESIA:   local, regional and spinal  COUNTS:  YES  TOURNIQUET:   Total Tourniquet Time Documented: Thigh (Right) - 66 minutes Total: Thigh (Right) - 66 minutes   DICTATION: .Other Dictation: Dictation Number [id number]  PLAN OF CARE: Admit to inpatient   PATIENT DISPOSITION:  PACU - hemodynamically stable.   Delay start of Pharmacological VTE agent (>24hrs) due to surgical blood loss or risk of bleeding: no

## 2019-09-20 NOTE — Anesthesia Postprocedure Evaluation (Signed)
Anesthesia Post Note  Patient: [name]  Procedure(s) Performed: RIGHT TOTAL KNEE ARTHROPLASTY (Right Knee)     Patient location during evaluation: PACU Anesthesia Type: Spinal Level of consciousness: oriented and awake and alert Pain management: pain level controlled Vital Signs Assessment: post-procedure vital signs reviewed and stable Respiratory status: spontaneous breathing, respiratory function stable and patient connected to nasal cannula oxygen Cardiovascular status: blood pressure returned to baseline and stable Postop Assessment: no headache, no backache, no apparent nausea or vomiting and spinal receding Anesthetic complications: no    Last Vitals:  Vitals:   09/20/19 1200 09/20/19 1318  BP: (!) 146/70 (!) 149/59  Pulse: (!) 56 (!) 52  Resp: 15 16  Temp: 36.6 C 36.4 C  SpO2: 97% 99%                 Effie Berkshire

## 2019-09-21 DIAGNOSIS — K449 Diaphragmatic hernia without obstruction or gangrene: Secondary | ICD-10-CM | POA: Diagnosis not present

## 2019-09-21 DIAGNOSIS — G4733 Obstructive sleep apnea (adult) (pediatric): Secondary | ICD-10-CM | POA: Diagnosis not present

## 2019-09-21 DIAGNOSIS — M1711 Unilateral primary osteoarthritis, right knee: Secondary | ICD-10-CM | POA: Diagnosis not present

## 2019-09-21 DIAGNOSIS — I480 Paroxysmal atrial fibrillation: Secondary | ICD-10-CM | POA: Diagnosis not present

## 2019-09-21 DIAGNOSIS — F5104 Psychophysiologic insomnia: Secondary | ICD-10-CM | POA: Diagnosis not present

## 2019-09-21 DIAGNOSIS — I10 Essential (primary) hypertension: Secondary | ICD-10-CM | POA: Diagnosis not present

## 2019-09-21 LAB — CBC
HCT: 35.2 % — ABNORMAL LOW (ref 36.0–46.0)
Hemoglobin: 11.4 g/dL — ABNORMAL LOW (ref 12.0–15.0)
MCH: 30.2 pg (ref 26.0–34.0)
MCHC: 32.4 g/dL (ref 30.0–36.0)
MCV: 93.4 fL (ref 80.0–100.0)
Platelets: 191 10*3/uL (ref 150–400)
RBC: 3.77 MIL/uL — ABNORMAL LOW (ref 3.87–5.11)
RDW: 12.7 % (ref 11.5–15.5)
WBC: 11.6 10*3/uL — ABNORMAL HIGH (ref 4.0–10.5)
nRBC: 0 % (ref 0.0–0.2)

## 2019-09-21 LAB — BASIC METABOLIC PANEL
Anion gap: 6 (ref 5–15)
BUN: 18 mg/dL (ref 8–23)
CO2: 23 mmol/L (ref 22–32)
Calcium: 8 mg/dL — ABNORMAL LOW (ref 8.9–10.3)
Chloride: 110 mmol/L (ref 98–111)
Creatinine, Ser: 0.84 mg/dL (ref 0.44–1.00)
GFR calc Af Amer: >60 mL/min (ref >60)
GFR calc non Af Amer: >60 mL/min (ref >60)
Glucose, Bld: 129 mg/dL — ABNORMAL HIGH (ref 70–99)
Potassium: 4.4 mmol/L (ref 3.5–5.1)
Sodium: 139 mmol/L (ref 135–145)

## 2019-09-21 MED ORDER — ONDANSETRON 4 MG PO TBDP: 4.0000 mg | ORAL_TABLET | Freq: Three times a day (TID) | ORAL | 0 refills | Status: DC | PRN

## 2019-09-21 MED ORDER — METHOCARBAMOL 500 MG PO TABS: 500.0000 mg | ORAL_TABLET | Freq: Four times a day (QID) | ORAL | 0 refills | Status: DC | PRN

## 2019-09-21 MED ORDER — OXYCODONE HCL 5 MG PO TABS: 5.0000 mg | ORAL_TABLET | ORAL | 0 refills | Status: DC | PRN

## 2019-09-21 NOTE — Progress Notes (Signed)
Subjective: 1 Day Post-Op Procedure(s) (LRB): RIGHT TOTAL KNEE ARTHROPLASTY (Right) Patient reports pain as moderate.  She states that she believes she will be fine for going home this afternoon.  Objective: Vital signs in last 24 hours: Temp:  [97.2 F (36.2 C)-98.5 F (36.9 C)] 98.5 F (36.9 C) (11/14 0835) Pulse Rate:  [52-61] 61 (11/14 0835) Resp:  [13-19] 15 (11/14 0835) BP: (103-176)/(50-87) 103/50 (11/14 0835) SpO2:  [94 %-100 %] 95 % (11/14 0835) Weight:  [82.6 kg] 82.6 kg (11/13 1230)  Intake/Output from previous day: 11/13 0701 - 11/14 0700 In: 3477.6 [P.O.:240; I.V.:3187.6; IV Piggyback:50] Out: 2150 [Urine:2100; Blood:50] Intake/Output this shift: Total I/O In: -  Out: 100 [Urine:100]  Recent Labs    09/21/19 0301  HGB 11.4*   Recent Labs    09/21/19 0301  WBC 11.6*  RBC 3.77*  HCT 35.2*  PLT 191   Recent Labs    09/21/19 0301  NA 139  K 4.4  CL 110  CO2 23  BUN 18  CREATININE 0.84  GLUCOSE 129*  CALCIUM 8.0*   Recent Labs    09/20/19 0718  INR 1.2    Sensation intact distally Intact pulses distally Dorsiflexion/Plantar flexion intact Incision: dressing C/D/I No cellulitis present Compartment soft   Assessment/Plan: 1 Day Post-Op Procedure(s) (LRB): RIGHT TOTAL KNEE ARTHROPLASTY (Right) Up with therapy Discharge home with home health this afternoon.    Patient's anticipated LOS is less than 2 midnights, meeting these requirements: - Younger than 91 - Lives within 1 hour of care - Has a competent adult at home to recover with post-op recover - NO history of  - Chronic pain requiring opiods  - Diabetes  - Coronary Artery Disease  - Heart failure  - Heart attack  - Stroke  - DVT/VTE  - Cardiac arrhythmia  - Respiratory Failure/COPD  - Renal failure  - Anemia  - Advanced Liver disease       Mcarthur Rossetti 09/21/2019, 9:30 AM

## 2019-09-21 NOTE — Care Management Obs Status (Signed)
[facility] NOTIFICATION   Patient Details  Name: [name] MRN: [id number] Date of Birth: October 28, 1946   Medicare Observation Status Notification Given:  Yes    Joaquin Courts, RN 09/21/2019, 10:15 AM

## 2019-09-21 NOTE — Discharge Summary (Signed)
Patient ID: [name] MRN: [id number] DOB/AGE: 1946-04-27 73 y.o.  Admit date: 09/20/2019 Discharge date: 09/21/2019  Admission Diagnoses:  Principal Problem:   Unilateral primary osteoarthritis, right knee Active Problems:   Status post total right knee replacement   Discharge Diagnoses:  Same  Past Medical History:  Diagnosis Date  . Acute upper respiratory infections of unspecified site   . Arthritis   . Carotid bruit   . Carotid disease, bilateral ([place]) 01/31/2012   Mild noted on US carotid  . Cataracts, both eyes   . Chronic insomnia   . Diverticulosis    pt denies  . Dysrhythmia    a fib  . Fatty liver   . GERD (gastroesophageal reflux disease)   . Heart murmur    Has been told by an MD.  . Hiatal hernia    Denies  . History of colon polyps    Noted on Colonscopy  . History of gallstones   . Hyperlipidemia   . Hypertension   . Insomnia, controlled 01/01/2014  . Internal hemorrhoids   . Macular pucker, left eye    surgery planned 01-14-15  . Menopause    noticed in early 1's  . MR (mitral regurgitation) 09/10/2014   Mild, Noted on ECHO   . Other acute sinusitis   . Other malaise and fatigue   . Paroxysmal atrial fibrillation (HCC)    on meds  . Pneumonia    as a small child  . PONV (postoperative nausea and vomiting)   . Pulmonary hypertension ([place]) 09/10/2014   Mild, Noted on ECHO  . Pure hypercholesterolemia   . Restless leg syndrome   . Routine general medical examination at a health care facility   . Screening for lipoid disorders   . Sleep apnea    no c-pap  . Special screening for malignant neoplasms, colon   . Uterine fibroid 09/19/1999   Noted on pelvis ultrasound    Surgeries: Procedure(s): RIGHT TOTAL KNEE ARTHROPLASTY on 09/20/2019   Consultants:   Discharged Condition: Improved  Hospital Course: XENIA GILGENBACH is an 73 y.o. female who was admitted 09/20/2019 for operative treatment ofUnilateral primary  osteoarthritis, right knee. Patient has severe unremitting pain that affects sleep, daily activities, and work/hobbies. After pre-op clearance the patient was taken to the operating room on 09/20/2019 and underwent  Procedure(s): RIGHT TOTAL KNEE ARTHROPLASTY.    Patient was given perioperative antibiotics:  Anti-infectives (From admission, onward)   Start     Dose/Rate Route Frequency Ordered Stop   09/20/19 1430  ceFAZolin (ANCEF) IVPB 1 g/50 mL premix     1 g 100 mL/hr over 30 Minutes Intravenous Every 6 hours 09/20/19 1221 09/20/19 2055   09/20/19 0615  ceFAZolin (ANCEF) IVPB 2g/100 mL premix     2 g 200 mL/hr over 30 Minutes Intravenous On call to O.R. 09/20/19 [id number] 09/20/19 0847       Patient was given sequential compression devices, early ambulation, and chemoprophylaxis to prevent DVT.  Patient benefited maximally from hospital stay and there were no complications.    Recent vital signs:  Patient Vitals for the past 24 hrs:  BP Temp Temp src Pulse Resp SpO2 Height Weight  09/21/19 0835 (!) 103/50 98.5 F (36.9 C) - 61 15 95 % - -  09/21/19 0545 (!) 108/51 (!) 97.4 F (36.3 C) Oral 60 16 95 % - -  09/21/19 0101 (!) 116/55 98.2 F (36.8 C) Oral (!) 58 14 94 % - -  09/20/19 2135 129/64 (!) 97.2 F (36.2 C) Oral 61 16 96 % - -  09/20/19 1500 131/61 97.7 F (36.5 C) - 60 16 100 % - -  09/20/19 1401 127/61 97.7 F (36.5 C) Oral (!) 58 16 99 % - -  09/20/19 1318 (!) 149/59 97.6 F (36.4 C) Oral (!) 52 16 99 % - -  09/20/19 1230 (!) 143/72 97.6 F (36.4 C) Oral (!) 53 14 100 % 5' 2.99" (1.6 m) 82.6 kg  09/20/19 1200 (!) 146/70 97.8 F (36.6 C) - (!) 56 15 97 % - -  09/20/19 1145 (!) 159/78 - - (!) 58 13 98 % - -  09/20/19 1130 (!) 160/83 - - (!) 59 13 100 % - -  09/20/19 1115 (!) 176/87 - - (!) 59 14 100 % - -  09/20/19 1100 (!) 164/72 - - (!) 57 17 100 % - -  09/20/19 1045 (!) 144/76 - - (!) 58 19 100 % - -  09/20/19 1043 (!) 147/67 (!) 97.5 F (36.4 C) - 61 17 100 % -  -     Recent laboratory studies:  Recent Labs    09/20/19 0718 09/21/19 0301  WBC  --  11.6*  HGB  --  11.4*  HCT  --  35.2*  PLT  --  191  NA  --  139  K  --  4.4  CL  --  110  CO2  --  23  BUN  --  18  CREATININE  --  0.84  GLUCOSE  --  129*  INR 1.2  --   CALCIUM  --  8.0*     Discharge Medications:   Allergies as of 09/21/2019      Reactions   Codeine Nausea And Vomiting   Other Nausea And Vomiting   general anesthesia   Oxycodone Nausea And Vomiting      Medication List    TAKE these medications   acetaminophen 500 MG tablet Commonly known as: TYLENOL Take 500 mg by mouth every 6 (six) hours as needed for moderate pain or headache.   celecoxib 100 MG capsule Commonly known as: CELEBREX TAKE 1 CAPSULE BY MOUTH TWICE A DAY   Eliquis 5 MG Tabs tablet Generic drug: apixaban TAKE 1 TABLET BY MOUTH TWICE A DAY What changed: how much to take   losartan 50 MG tablet Commonly known as: COZAAR Take 100 mg by mouth daily.   methocarbamol 500 MG tablet Commonly known as: ROBAXIN Take 1 tablet (500 mg total) by mouth every 6 (six) hours as needed for muscle spasms.   metoprolol succinate 50 MG 24 hr tablet Commonly known as: TOPROL-XL TAKE 1 TABLET BY MOUTH EVERY DAY What changed: when to take this   ondansetron 4 MG disintegrating tablet Commonly known as: Zofran ODT Take 1 tablet (4 mg total) by mouth every 8 (eight) hours as needed for nausea or vomiting.   oxyCODONE 5 MG immediate release tablet Commonly known as: Oxy IR/ROXICODONE Take 1-2 tablets (5-10 mg total) by mouth every 4 (four) hours as needed for moderate pain (pain score 4-6).   propafenone 300 MG tablet Commonly known as: RYTHMOL TAKE 1 TABLET (300 MG TOTAL) BY MOUTH 2 (TWO) TIMES DAILY.   SYSTANE OP Place 1 drop into both eyes daily as needed (dry eyes).   zolpidem 10 MG tablet Commonly known as: AMBIEN Take 0.5 tablets (5 mg total) by mouth at bedtime. TAKE ONE-HALF TABLET BY  MOUTH  AT BEDTIME AS NEEDED What changed:   when to take this  reasons to take this  additional instructions            Durable Medical Equipment  (From admission, onward)         Start     Ordered   09/20/19 1222  DME 3 n 1  Once     09/20/19 1221   09/20/19 1222  DME Walker rolling  Once    Question:  Patient needs a walker to treat with the following condition  Answer:  Status post total right knee replacement   09/20/19 1221          Diagnostic Studies: Dg Knee Right Port  Result Date: 09/20/2019 CLINICAL DATA:  Follow-up total knee arthroplasty EXAM: PORTABLE RIGHT KNEE - 1-2 VIEW COMPARISON:  04/23/2019 FINDINGS: Two-view show total knee arthroplasty. Components appear well positioned. No radiographically detectable complication. Air and fluid in the joint. IMPRESSION: Good appearance following total knee arthroplasty. Electronically Signed   By: Nelson Chimes M.D.   On: 09/20/2019 11:16    Disposition: Discharge disposition: 01-Home or Self Care         Follow-up Information    Mcarthur Rossetti, MD. Go on 10/07/2019.   Specialty: Orthopedic Surgery Why: At 2:30 pm in office for 2 week post-op NEW OFFICE ADDRESS IS: [address] [place], [place] [address]. Phone number unchanged.  Contact information: [place] [address] [phone]        Home, [facility] up.   Specialty: Home Health Services Why: You have been authorized for 5 home health physical therapy visits prior to your 2 week post-op appointment with Dr. Ninfa Linden. Someone from the agency will be in touch with you after discharge from the hospital to arrange your evaluation in home. Contact information: [address] [place] [address] [phone]        Medicine, [facility]. Go on 10/08/2019.   Why: at 12:30 pm for your first Outpatient Physical Therapy appointment. Contact information: [place] [address] [phone]            Signed: Mcarthur Rossetti 09/21/2019, 9:35 AM

## 2019-09-21 NOTE — Progress Notes (Signed)
Physical Therapy Treatment Patient Details Name: PAITYNN OLINSKI MRN: [id number] DOB: [birth year] Today's Date: 09/21/2019    History of Present Illness Patient is 73 y.o. female s/p Rt TKA on 09/20/19 with PMH sinificant for HTN, HLD, A-fib, PAF, and OA.    PT Comments    Pt reports she is feeling fatigued and weak from sitting up in the chair all morning. Provided pt with HEP and ambulated in hallway. Plan for steps in PM prior to d/c home.   Follow Up Recommendations  Follow surgeon's recommendation for DC plan and follow-up therapies     Equipment Recommendations  None recommended by PT    Recommendations for Other Services       Precautions / Restrictions Precautions Precautions: Fall Restrictions Weight Bearing Restrictions: No    Mobility  Bed Mobility Overal bed mobility: Needs Assistance Bed Mobility: Sit to Supine     Supine to sit: Supervision     General bed mobility comments: Pt able to progress back into bed with HOB elevated. No physical assist required. Supervision for safety.  Transfers Overall transfer level: Needs assistance Equipment used: Rolling walker (2 wheeled) Transfers: Sit to/from Stand Sit to Stand: Min assist         General transfer comment: Assist to steady once up. Cues for hand placement with RW  Ambulation/Gait Ambulation/Gait assistance: Min guard Gait Distance (Feet): 40 Feet Assistive device: Rolling walker (2 wheeled) Gait Pattern/deviations: Step-through pattern;Decreased stride length;Antalgic Gait velocity: decreased   General Gait Details: mildly antalgic gait with cues for RW proximity. Distance limited secondary to fatigue.   Stairs             Wheelchair Mobility    Modified Rankin (Stroke Patients Only)       Balance Overall balance assessment: Needs assistance Sitting-balance support: Feet supported;No upper extremity supported Sitting balance-Leahy Scale: Good     Standing balance  support: During functional activity;Bilateral upper extremity supported Standing balance-Leahy Scale: Poor                              Cognition Arousal/Alertness: Awake/alert Behavior During Therapy: WFL for tasks assessed/performed Overall Cognitive Status: Within Functional Limits for tasks assessed                                        Exercises Total Joint Exercises Ankle Circles/Pumps: AROM;15 reps;Seated;Both Quad Sets: AROM;10 reps;Right;Seated Towel Squeeze: AROM;10 reps;Right;Seated Short Arc Quad: AROM;10 reps;Right;Seated Heel Slides: AROM;10 reps;Right;Seated Hip ABduction/ADduction: AROM;10 reps;Right;Seated Straight Leg Raises: AROM;10 reps;Right;Seated    General Comments        Pertinent Vitals/Pain Pain Assessment: Faces Faces Pain Scale: Hurts little more Pain Descriptors / Indicators: Guarding;Grimacing Pain Intervention(s): Monitored during session;Limited activity within patient's tolerance;Repositioned;Ice applied    Home Living                      Prior Function            PT Goals (current goals can now be found in the care plan section) Acute Rehab PT Goals Patient Stated Goal: to be able to get bak to gardening PT Goal Formulation: With patient Time For Goal Achievement: 09/27/19 Potential to Achieve Goals: Good Progress towards PT goals: Progressing toward goals    Frequency    7X/week      PT  Plan Current plan remains appropriate    Co-evaluation              AM-PAC PT "6 Clicks" Mobility   Outcome Measure  Help needed turning from your back to your side while in a flat bed without using bedrails?: A Little Help needed moving from lying on your back to sitting on the side of a flat bed without using bedrails?: A Little Help needed moving to and from a bed to a chair (including a wheelchair)?: A Little Help needed standing up from a chair using your arms (e.g., wheelchair or  bedside chair)?: A Little Help needed to walk in hospital room?: A Little Help needed climbing 3-5 steps with a railing? : A Little 6 Click Score: 18    End of Session Equipment Utilized During Treatment: Gait belt Activity Tolerance: Patient limited by fatigue Patient left: with call bell/phone within reach;in bed Nurse Communication: Mobility status(need for PM treatment before d/c) PT Visit Diagnosis: Muscle weakness (generalized) (M62.81);Difficulty in walking, not elsewhere classified (R26.2)     Time: [id number] PT Time Calculation (min) (ACUTE ONLY): 19 min  Charges:  $Therapeutic Exercise: 8-22 mins                     Benjiman Core, [place] Pager [phone] Acute Rehab   IKERIA LOSIER 09/21/2019, 11:24 AM

## 2019-09-21 NOTE — Progress Notes (Signed)
09/21/19 1449  PT Visit Information  Last PT Received On 09/21/19  Assistance Needed +1  History of Present Illness Patient is 73 y.o. female s/p Rt TKA on 09/20/19 with PMH sinificant for HTN, HLD, A-fib, PAF, and OA.  Subjective Data  Patient Stated Goal to be able to get bak to gardening  Precautions  Precautions Fall  Restrictions  Weight Bearing Restrictions No  Pain Assessment  Pain Assessment Faces  Faces Pain Scale 8  Pain Location R Knee with movement  Pain Descriptors / Indicators Guarding;Grimacing  Pain Intervention(s) Monitored during session;Limited activity within patient's tolerance;Repositioned;Patient requesting pain meds-RN notified  Cognition  Arousal/Alertness Awake/alert  Behavior During Therapy WFL for tasks assessed/performed  Overall Cognitive Status Within Functional Limits for tasks assessed  Bed Mobility  Overal bed mobility Needs Assistance  Bed Mobility Supine to Sit  Supine to sit Supervision  General bed mobility comments Increased time and effort with use of bed rails and HOB slightly elevated. No physical assist required. Pt using UEs to progress RLE off EOB.  Transfers  Overall transfer level Needs assistance  Equipment used Rolling walker (2 wheeled)  Transfers Sit to/from Stand  Sit to Stand Min assist  General transfer comment Use of momentum to rise from EOB. Cues for hand placement.  Ambulation/Gait  Ambulation/Gait assistance Min guard  Gait Distance (Feet) 25 Feet (60ftx1, 70ftx1 )  Assistive device Rolling walker (2 wheeled)  Gait Pattern/deviations Decreased stride length;Antalgic;Step-to pattern;Decreased step length - left;Decreased stance time - right  General Gait Details Pt taking smaller steps this session, secondary to increased pain. Cues for longer stride length and postrual control.  Gait velocity decreased  Stairs Yes  Stairs assistance Min assist  Stair Management No rails;Step to pattern;Backwards;With walker   Number of Stairs 2  General stair comments Pt negotiated 2 steps to simulate home enviroment. Cues for sequencing and technique with RW. Assist for RW management and stability.  Balance  Overall balance assessment Needs assistance  Sitting-balance support Feet supported;No upper extremity supported  Sitting balance-Leahy Scale Good  Standing balance support During functional activity;Bilateral upper extremity supported  Standing balance-Leahy Scale Poor  General Comments  General comments (skin integrity, edema, etc.) Daughter present and involved in session  PT - End of Session  Equipment Utilized During Treatment Gait belt  Activity Tolerance Patient tolerated treatment well;Patient limited by pain  Patient left with call bell/phone within reach;in chair;with family/visitor present  Nurse Communication Mobility status;Need for lift equipment   PT - Assessment/Plan  PT Plan Current plan remains appropriate  PT Visit Diagnosis Muscle weakness (generalized) (M62.81);Difficulty in walking, not elsewhere classified (R26.2)  PT Frequency (ACUTE ONLY) 7X/week  Follow Up Recommendations Follow surgeon's recommendation for DC plan and follow-up therapies  PT equipment None recommended by PT  AM-PAC PT "6 Clicks" Mobility Outcome Measure (Version 2)  Help needed turning from your back to your side while in a flat bed without using bedrails? 3  Help needed moving from lying on your back to sitting on the side of a flat bed without using bedrails? 3  Help needed moving to and from a bed to a chair (including a wheelchair)? 3  Help needed standing up from a chair using your arms (e.g., wheelchair or bedside chair)? 3  Help needed to walk in hospital room? 3  Help needed climbing 3-5 steps with a railing?  3  6 Click Score 18  Consider Recommendation of Discharge To: Home with [facility]  PT Goal  Progression  Progress towards PT goals Progressing toward goals  Acute Rehab PT Goals  PT Goal Formulation  With patient  Time For Goal Achievement 09/27/19  Potential to Achieve Goals Good  PT Time Calculation  PT Start Time (ACUTE ONLY) 1410  PT Stop Time (ACUTE ONLY) 1437  PT Time Calculation (min) (ACUTE ONLY) 27 min  PT General Charges  $$ ACUTE PT VISIT 1 Visit  PT Treatments  $Gait Training 23-37 mins   Pt is progressing well towards goals. She completed stair training this session and ambulated short distance before requesting seated break secondary to pain. She is expected to d/c today with HHPT to follow up.   Benjiman Core, PTA Pager [phone] Acute Rehab

## 2019-09-21 NOTE — TOC Progression Note (Signed)
Transition of Care [facility]) - Progression Note    Patient Details  Name: [name] MRN: [id number] Date of Birth: Oct 02, 1946  [facility]) CM/SW Contact  Joaquin Courts, RN Phone Number: 09/21/2019, 12:20 PM  Clinical Narrative:    CM spoke with patient at bedside. [facility] for [place]. Reports has rolling walker and 3-in-1 at home.   Expected Discharge Plan: [place] Barriers to Discharge: No Barriers Identified  Expected Discharge Plan and Services Expected Discharge Plan: [place]   Discharge Planning Services: CM Consult Post Acute Care Choice: [place] arrangements for the past 2 months: Single Family Home Expected Discharge Date: 09/21/19               DME Arranged: N/A DME Agency: NA       HH Arranged: PT HH Agency: [facility] (formerly [employer])     Representative spoke with at [place]: pre arranged in MD office   Social Determinants of Health ([place]) Interventions    Readmission Risk Interventions No flowsheet data found.

## 2019-09-21 NOTE — Discharge Instructions (Signed)

## 2019-09-21 NOTE — Progress Notes (Signed)
    Home health agencies that serve [phone].        [place] Quality of Patient Care Rating Patient Survey Summary Rating  [facility] [phone] out of 5 stars 5 out of [place] [phone] 4  out of 5 stars 5 out of [place] [phone]) [phone] 4  out of 5 stars 4 out of [place] [phone] 4  out of 5 stars 5 out of 5 stars  [place] [phone] 4 out of 5 stars 4 out of [place] [phone] 4 out of 5 stars 4 out of 5 stars  [employer] [phone] 3  out of 5 stars 4 out of [place] [phone] 3 out of 5 stars 4 out of [place] [phone] 3  out of 5 stars 4 out of [place] [phone] 3  out of 5 stars 4 out of [place] [phone] 3  out of 5 stars 4 out of [place] [phone] 4  out of 5 stars 4 out of [place] [phone] 4  out of 5 stars 3 out of [place] number Footnote as displayed on [place]  1 This agency provides services under a federal waiver program to non-traditional, chronic long term population.  2 This agency provides services to a special needs population.  3 Not Available.  4 The number of patient episodes for this measure is too small to report.  5 This measure currently does not have data or provider has been certified/recertified for less than 6 months.  6 The national average for this measure is not provided because of state-to-state differences in data collection.  7 Medicare is not displaying rates for this measure for any home health agency, because of an issue with the data.  8  There were problems with the data and they are being corrected.  9 Zero, or very few, patients met the survey's rules for inclusion. The scores shown, if any, reflect a very small number of surveys and may not accurately tell how an agency is doing.  10 Survey results are based on less than 12 months of data.  11 Fewer than 70 patients completed the survey. Use the scores shown, if any, with caution as the number of surveys may be too low to accurately tell how an agency is doing.  12 No survey results are available for this period.  13 Data suppressed by CMS for one or more quarters.

## 2019-09-22 DIAGNOSIS — I480 Paroxysmal atrial fibrillation: Secondary | ICD-10-CM | POA: Diagnosis not present

## 2019-09-22 DIAGNOSIS — Z7901 Long term (current) use of anticoagulants: Secondary | ICD-10-CM | POA: Diagnosis not present

## 2019-09-22 DIAGNOSIS — I272 Pulmonary hypertension, unspecified: Secondary | ICD-10-CM | POA: Diagnosis not present

## 2019-09-22 DIAGNOSIS — Z86010 Personal history of colonic polyps: Secondary | ICD-10-CM | POA: Diagnosis not present

## 2019-09-22 DIAGNOSIS — Z471 Aftercare following joint replacement surgery: Secondary | ICD-10-CM | POA: Diagnosis not present

## 2019-09-22 DIAGNOSIS — M1990 Unspecified osteoarthritis, unspecified site: Secondary | ICD-10-CM | POA: Diagnosis not present

## 2019-09-22 DIAGNOSIS — I10 Essential (primary) hypertension: Secondary | ICD-10-CM | POA: Diagnosis not present

## 2019-09-22 DIAGNOSIS — Z96652 Presence of left artificial knee joint: Secondary | ICD-10-CM | POA: Diagnosis not present

## 2019-09-22 DIAGNOSIS — K219 Gastro-esophageal reflux disease without esophagitis: Secondary | ICD-10-CM | POA: Diagnosis not present

## 2019-09-22 DIAGNOSIS — E7800 Pure hypercholesterolemia, unspecified: Secondary | ICD-10-CM | POA: Diagnosis not present

## 2019-09-22 DIAGNOSIS — K449 Diaphragmatic hernia without obstruction or gangrene: Secondary | ICD-10-CM | POA: Diagnosis not present

## 2019-09-22 DIAGNOSIS — I6529 Occlusion and stenosis of unspecified carotid artery: Secondary | ICD-10-CM | POA: Diagnosis not present

## 2019-09-22 DIAGNOSIS — K760 Fatty (change of) liver, not elsewhere classified: Secondary | ICD-10-CM | POA: Diagnosis not present

## 2019-09-22 DIAGNOSIS — K5730 Diverticulosis of large intestine without perforation or abscess without bleeding: Secondary | ICD-10-CM | POA: Diagnosis not present

## 2019-09-22 DIAGNOSIS — Z96651 Presence of right artificial knee joint: Secondary | ICD-10-CM | POA: Diagnosis not present

## 2019-09-22 DIAGNOSIS — G4733 Obstructive sleep apnea (adult) (pediatric): Secondary | ICD-10-CM | POA: Diagnosis not present

## 2019-09-22 DIAGNOSIS — H269 Unspecified cataract: Secondary | ICD-10-CM | POA: Diagnosis not present

## 2019-09-22 DIAGNOSIS — G2581 Restless legs syndrome: Secondary | ICD-10-CM | POA: Diagnosis not present

## 2019-09-22 DIAGNOSIS — F5104 Psychophysiologic insomnia: Secondary | ICD-10-CM | POA: Diagnosis not present

## 2019-09-22 DIAGNOSIS — F418 Other specified anxiety disorders: Secondary | ICD-10-CM | POA: Diagnosis not present

## 2019-09-23 ENCOUNTER — Telehealth: Payer: Self-pay | Admitting: *Deleted

## 2019-09-23 NOTE — Telephone Encounter (Signed)
D/C Ortho bundle call attempted. Left VM on home phone.

## 2019-09-23 NOTE — Care Plan (Signed)
RNCM call to check status after discharge home over the weekend on 09/21/19. Left VM on home number requesting call back. If don't hear back from patient today, will call tomorrow.

## 2019-09-24 ENCOUNTER — Telehealth: Payer: Self-pay | Admitting: *Deleted

## 2019-09-24 DIAGNOSIS — Z471 Aftercare following joint replacement surgery: Secondary | ICD-10-CM | POA: Diagnosis not present

## 2019-09-24 DIAGNOSIS — I10 Essential (primary) hypertension: Secondary | ICD-10-CM | POA: Diagnosis not present

## 2019-09-24 DIAGNOSIS — K219 Gastro-esophageal reflux disease without esophagitis: Secondary | ICD-10-CM | POA: Diagnosis not present

## 2019-09-24 DIAGNOSIS — I272 Pulmonary hypertension, unspecified: Secondary | ICD-10-CM | POA: Diagnosis not present

## 2019-09-24 DIAGNOSIS — Z96651 Presence of right artificial knee joint: Secondary | ICD-10-CM | POA: Diagnosis not present

## 2019-09-24 DIAGNOSIS — I480 Paroxysmal atrial fibrillation: Secondary | ICD-10-CM | POA: Diagnosis not present

## 2019-09-24 MED ORDER — PROMETHAZINE HCL 12.5 MG PO TABS: 12.5000 mg | ORAL_TABLET | Freq: Four times a day (QID) | ORAL | 0 refills | Status: DC | PRN

## 2019-09-24 NOTE — Telephone Encounter (Signed)
Call to check on patient and her daughter states she is extremely nauseated last night and today. Have backed off to 1 Oxycodone instead of 2 at a time and using Zofran as prescribed, but still nauseated. Daughter asking if there is anything else for nausea that can be used to deliver a constant amount of medication, such as a phenergan patch, as she has to do this for any type of surgery as well. Recommendations?

## 2019-09-24 NOTE — Telephone Encounter (Signed)
I did send in some oral Phenergan.  I did not see where I can prescribe any type of Phenergan patches and I have not heard of these.  There was no option with him the computer that I could select a Phenergan patch.  I can only do pills or suppositories.

## 2019-09-24 NOTE — Telephone Encounter (Signed)
Ortho bundle D/C call completed. 

## 2019-09-24 NOTE — Care Plan (Signed)
RNCM call to patient to check status after D/C from the hospital over the weekend. Attempted call yesterday as well> Spoke with patient's daughter, who is there with her mother. She is doing well except for nausea with taking Oxycodone. Daughter asked if there was something else that could be prescribed for nausea such as a patch of some sort. Message sent to Dr. Ninfa Linden, who informed he would call in some oral phenergan, but doesn't know of anything such as a patch that could be prescribed. Call back to patient and spoke with daughter and informed that Phenergan has been called into her pharmacy. Home health therapy has been out today and she is now resting. Doing well overall. Will continue to follow up for any CM needs.

## 2019-09-25 ENCOUNTER — Telehealth: Payer: Self-pay | Admitting: *Deleted

## 2019-09-25 MED ORDER — OXYCODONE HCL 5 MG PO TABS: 5.0000 mg | ORAL_TABLET | ORAL | 0 refills | Status: DC | PRN

## 2019-09-25 NOTE — Telephone Encounter (Signed)
Received call from patient's daughter on behalf of her mother stating that she will be out of her pain medication soon. Requesting refill of Oxycodone. Patient has decreased to 1 at a time instead of 2. Thank you.

## 2019-09-25 NOTE — Telephone Encounter (Signed)
I sent some in 

## 2019-09-25 NOTE — Telephone Encounter (Signed)
I will update the daughter to pick up. Thanks.

## 2019-09-26 ENCOUNTER — Encounter (HOSPITAL_COMMUNITY): Payer: Self-pay | Admitting: Orthopaedic Surgery

## 2019-09-27 DIAGNOSIS — K219 Gastro-esophageal reflux disease without esophagitis: Secondary | ICD-10-CM | POA: Diagnosis not present

## 2019-09-27 DIAGNOSIS — Z471 Aftercare following joint replacement surgery: Secondary | ICD-10-CM | POA: Diagnosis not present

## 2019-09-27 DIAGNOSIS — Z96651 Presence of right artificial knee joint: Secondary | ICD-10-CM | POA: Diagnosis not present

## 2019-09-27 DIAGNOSIS — I480 Paroxysmal atrial fibrillation: Secondary | ICD-10-CM | POA: Diagnosis not present

## 2019-09-27 DIAGNOSIS — I10 Essential (primary) hypertension: Secondary | ICD-10-CM | POA: Diagnosis not present

## 2019-09-27 DIAGNOSIS — I272 Pulmonary hypertension, unspecified: Secondary | ICD-10-CM | POA: Diagnosis not present

## 2019-10-01 DIAGNOSIS — I480 Paroxysmal atrial fibrillation: Secondary | ICD-10-CM | POA: Diagnosis not present

## 2019-10-01 DIAGNOSIS — Z471 Aftercare following joint replacement surgery: Secondary | ICD-10-CM | POA: Diagnosis not present

## 2019-10-01 DIAGNOSIS — I272 Pulmonary hypertension, unspecified: Secondary | ICD-10-CM | POA: Diagnosis not present

## 2019-10-01 DIAGNOSIS — I10 Essential (primary) hypertension: Secondary | ICD-10-CM | POA: Diagnosis not present

## 2019-10-01 DIAGNOSIS — Z96651 Presence of right artificial knee joint: Secondary | ICD-10-CM | POA: Diagnosis not present

## 2019-10-01 DIAGNOSIS — K219 Gastro-esophageal reflux disease without esophagitis: Secondary | ICD-10-CM | POA: Diagnosis not present

## 2019-10-02 DIAGNOSIS — Z96651 Presence of right artificial knee joint: Secondary | ICD-10-CM | POA: Diagnosis not present

## 2019-10-02 DIAGNOSIS — I10 Essential (primary) hypertension: Secondary | ICD-10-CM | POA: Diagnosis not present

## 2019-10-02 DIAGNOSIS — I480 Paroxysmal atrial fibrillation: Secondary | ICD-10-CM | POA: Diagnosis not present

## 2019-10-02 DIAGNOSIS — I272 Pulmonary hypertension, unspecified: Secondary | ICD-10-CM | POA: Diagnosis not present

## 2019-10-02 DIAGNOSIS — K219 Gastro-esophageal reflux disease without esophagitis: Secondary | ICD-10-CM | POA: Diagnosis not present

## 2019-10-02 DIAGNOSIS — Z471 Aftercare following joint replacement surgery: Secondary | ICD-10-CM | POA: Diagnosis not present

## 2019-10-04 ENCOUNTER — Other Ambulatory Visit: Payer: Self-pay | Admitting: Orthopaedic Surgery

## 2019-10-07 ENCOUNTER — Inpatient Hospital Stay: Payer: Medicare Other | Admitting: Orthopaedic Surgery

## 2019-10-07 ENCOUNTER — Telehealth: Payer: Self-pay | Admitting: *Deleted

## 2019-10-07 MED ORDER — OXYCODONE HCL 5 MG PO TABS: 5.0000 mg | ORAL_TABLET | ORAL | 0 refills | Status: DC | PRN

## 2019-10-07 NOTE — Telephone Encounter (Signed)
I sent in some more 

## 2019-10-07 NOTE — Care Plan (Signed)
RNCM call to patient to check status 1 days post op. Patient was originally scheduled for in office appointment today, but due to MD schedule, she was rescheduled for 10/09/19 at 9:15 am with Dr. Ninfa Linden. She verbalized she is doing extremely well. Taking Oxycodone very sparingly at this time (1 at night, 1 in am). Discussed OPPT that is scheduled for Tuesday, 10/08/19 at 12:30 pm. She verbalized understanding and did request a refill of her pain medication. Requested it did not have to be the full 40 tablets that was given in the previous Rx. Will send message to Dr. Ninfa Linden to request refill.

## 2019-10-07 NOTE — Telephone Encounter (Signed)
Patient requesting refill of her pain medication. Has decreased to only 1-2 per day. Doing well overall. F/U in office on 10/09/2019. She states she doesn't anticipate needing a full prescription of 40 tablets. Thanks.

## 2019-10-07 NOTE — Telephone Encounter (Signed)
14 day Ortho bundle call completed.  

## 2019-10-07 NOTE — Telephone Encounter (Signed)
7 day Ortho bundle call completed on 09/27/19.

## 2019-10-07 NOTE — Telephone Encounter (Signed)
Refill

## 2019-10-08 DIAGNOSIS — M6281 Muscle weakness (generalized): Secondary | ICD-10-CM | POA: Diagnosis not present

## 2019-10-08 DIAGNOSIS — M25561 Pain in right knee: Secondary | ICD-10-CM | POA: Diagnosis not present

## 2019-10-08 DIAGNOSIS — R262 Difficulty in walking, not elsewhere classified: Secondary | ICD-10-CM | POA: Diagnosis not present

## 2019-10-09 ENCOUNTER — Ambulatory Visit (INDEPENDENT_AMBULATORY_CARE_PROVIDER_SITE_OTHER): Payer: Medicare Other | Admitting: Orthopaedic Surgery

## 2019-10-09 ENCOUNTER — Encounter: Payer: Self-pay | Admitting: Orthopaedic Surgery

## 2019-10-09 ENCOUNTER — Telehealth: Payer: Self-pay | Admitting: *Deleted

## 2019-10-09 ENCOUNTER — Other Ambulatory Visit: Payer: Self-pay

## 2019-10-09 DIAGNOSIS — Z96651 Presence of right artificial knee joint: Secondary | ICD-10-CM

## 2019-10-09 NOTE — Care Plan (Signed)
RNCM met with patient and her daughter in office for he 2 week post-op appointment with Benita Stabile, PA-C for Dr. Ninfa Linden. Order for OPPT written by PA today and CM faxed over to [facility]. Patient is aware of how to contact CM for further needs. Will continue to monitor for CM needs.

## 2019-10-09 NOTE — Progress Notes (Signed)
HPI: [name] returns today 2 weeks status post right total knee arthroplasty.  She is overall doing well.  She dates pain is worse at night.  She said no chest pain shortness of breath.  She started formal outpatient therapy yesterday.  She is in a cane to ambulate. Notes that her right great toe flexes down slightly and will bring it up to full extension.  She can actually curl her toes.  She has no deficit of her ankle.  No loss of sensation.  Physical exam: Right knee incision is well approximated staples no signs of infection.  Right knee full extension flexion 90 degrees.  Calf supple nontender.  Dorsiflexion plantarflexion ankle intact.  She is unable to extend her right great toe but able to somewhat hold her toe if assisted .  Full sensation throughout the foot.  Dorsal pedal pulses 2+.  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  She is able to get the incision wet.  Discussed with her that at this point time she should not return to driving due to the fact she is on narcotics and also due to the fact that she is status post right total knee arthroplasty only 2 weeks postop.  We will see her back in 4 weeks.  She will continue to work with therapy on range of motion strengthening.  Ask her to work on extension of the right great toe.  Did discuss with her that this should resolve with time.  Questions encouraged and answered.

## 2019-10-09 NOTE — Telephone Encounter (Signed)
2 week Ortho bundle in office visit completed.

## 2019-10-14 DIAGNOSIS — M25561 Pain in right knee: Secondary | ICD-10-CM | POA: Diagnosis not present

## 2019-10-14 DIAGNOSIS — R262 Difficulty in walking, not elsewhere classified: Secondary | ICD-10-CM | POA: Diagnosis not present

## 2019-10-14 DIAGNOSIS — M6281 Muscle weakness (generalized): Secondary | ICD-10-CM | POA: Diagnosis not present

## 2019-10-16 DIAGNOSIS — M6281 Muscle weakness (generalized): Secondary | ICD-10-CM | POA: Diagnosis not present

## 2019-10-16 DIAGNOSIS — M25561 Pain in right knee: Secondary | ICD-10-CM | POA: Diagnosis not present

## 2019-10-16 DIAGNOSIS — R262 Difficulty in walking, not elsewhere classified: Secondary | ICD-10-CM | POA: Diagnosis not present

## 2019-10-21 ENCOUNTER — Telehealth: Payer: Self-pay | Admitting: *Deleted

## 2019-10-21 NOTE — Telephone Encounter (Signed)
Attempted 30 day Ortho bundle call. Left VM for patient requesting call back.

## 2019-10-24 ENCOUNTER — Telehealth: Payer: Self-pay | Admitting: *Deleted

## 2019-10-24 NOTE — Care Plan (Signed)
RNCM call to patient to check status at 34 days post-op from a Right TKA. She reports she feels she is improving with therapy. Still feels like she is walking with her knee turned in, which is hurting her ankle. She verbalized she slightly turning her foot over. Still also having issues with her big toe of her surgical leg. It is "drawing" downward and cannot raise it, but she can wiggle it. Only taking Tylenol for pain. Pain increases at end of day when she sits down at night. Discussed possibly taking a muscle relaxer to see if this can ease some of the pain she is experiencing. Appointment scheduled with Dr. Ninfa Linden on 11/12/19 for her 6 week post-op. Will discuss all of these issues if still having at that time. Reviewed 30 day Patient Satisfaction Survey provided by TOM/THN in place of 10 question survey in Epic.  1. Before surgery, I was provided sufficient education regarding my surgery and the bundle program. Patient answer: Strongly agree 2. I was satisfied with the care provided by the nurse at the facility where my surgery was performed. Patient answer: Strongly agree 3. Following surgery, I received sufficient postoperative care instructions.  Patient answer:Strongly agree 4. I would recommend my surgeon and this bundle program to others.  Patient answer:Strongly agree Other comments:

## 2019-10-24 NOTE — Telephone Encounter (Signed)
30 day Ortho bundle call and survey completed. 

## 2019-11-06 ENCOUNTER — Other Ambulatory Visit: Payer: Self-pay | Admitting: Cardiology

## 2019-11-06 DIAGNOSIS — I480 Paroxysmal atrial fibrillation: Secondary | ICD-10-CM

## 2019-11-07 NOTE — Telephone Encounter (Signed)
This should be filled by primary MD  Giliana Vantil Martinique MD, [facility]

## 2019-11-07 NOTE — Progress Notes (Signed)
Virtual Visit via Telephone Note   This visit type was conducted due to national recommendations for restrictions regarding the COVID-19 Pandemic (e.g. social distancing) in an effort to limit this patient's exposure and mitigate transmission in our community.  Due to her co-morbid illnesses, this patient is at least at moderate risk for complications without adequate follow up.  This format is felt to be most appropriate for this patient at this time.  The patient did not have access to video technology/had technical difficulties with video requiring transitioning to audio format only (telephone).  All issues noted in this document were discussed and addressed.  No physical exam could be performed with this format.  Please refer to the patient's chart for her  consent to telehealth for [facility].   Date:  11/11/2019   ID:  [name], [name] August 11, 1946, MRN [id number]  Patient Location: Home Provider Location: Home  PCP:  Lucille Passy, MD  Cardiologist:  Keiffer Piper Martinique  MD Electrophysiologist:  None   Evaluation Performed:  Follow-Up Visit  Chief Complaint:  Afib   History of Present Illness:    [name] is a 73 y.o. female with a history of paroxysmal atrial fibrillation managed with Rhythmol and metoprolol. She is on Eliquis for anticoagulation.  In October 2015 she complained of increased palpitations and her Rhythmol dose was increased with good response. She complained of increased dyspnea and had a normal stress Myoview. Echo showed normal LV function with mild MR and mild Pulmonary HTN.   In July 2020 she underwent left TKR and in November she had right TKR. She notes her knees are much better now and she is walking normally. She only notes Afib if she misses a  Dose of her Rhythmol. No chest pain or dyspnea. BP has been running high.  The patient does not have symptoms concerning for COVID-19 infection (fever, chills, cough, or new shortness of breath).    Past  Medical History:  Diagnosis Date  . Acute upper respiratory infections of unspecified site   . Arthritis   . Carotid bruit   . Carotid disease, bilateral ([place]) 01/31/2012   Mild noted on US carotid  . Cataracts, both eyes   . Chronic insomnia   . Diverticulosis    pt denies  . Dysrhythmia    a fib  . Fatty liver   . GERD (gastroesophageal reflux disease)   . Heart murmur    Has been told by an MD.  . Hiatal hernia    Denies  . History of colon polyps    Noted on Colonscopy  . History of gallstones   . Hyperlipidemia   . Hypertension   . Insomnia, controlled 01/01/2014  . Internal hemorrhoids   . Macular pucker, left eye    surgery planned 01-14-15  . Menopause    noticed in early 48's  . MR (mitral regurgitation) 09/10/2014   Mild, Noted on ECHO   . Other acute sinusitis   . Other malaise and fatigue   . Paroxysmal atrial fibrillation (HCC)    on meds  . Pneumonia    as a small child  . PONV (postoperative nausea and vomiting)   . Pulmonary hypertension ([place]) 09/10/2014   Mild, Noted on ECHO  . Pure hypercholesterolemia   . Restless leg syndrome   . Routine general medical examination at a health care facility   . Screening for lipoid disorders   . Sleep apnea    no c-pap  .  Special screening for malignant neoplasms, colon   . Uterine fibroid 09/19/1999   Noted on pelvis ultrasound   Past Surgical History:  Procedure Laterality Date  . cardiolyte neg  6/06  . cardiovascular stress MRI  12/20/06   afib  . CHOLECYSTECTOMY    . COLONOSCOPY    . EYE SURGERY     cataract   Toric implant bil eyes  . KNEE ARTHROSCOPY     left  . ORIF- L distal radius  9/07   left wrist  . PARS PLANA VITRECTOMY W/ REPAIR OF MACULAR HOLE     left eye  . torn meniscus     bil knees    . TOTAL KNEE ARTHROPLASTY Left 05/31/2019   Procedure: LEFT TOTAL KNEE ARTHROPLASTY;  Surgeon: Mcarthur Rossetti, MD;  Location: [facility];  Service: Orthopedics;  Laterality: Left;  . TOTAL  KNEE ARTHROPLASTY Right 09/20/2019   Procedure: RIGHT TOTAL KNEE ARTHROPLASTY;  Surgeon: Mcarthur Rossetti, MD;  Location: [facility];  Service: Orthopedics;  Laterality: Right;  . TUBAL LIGATION    . UPPER GI ENDOSCOPY       Current Meds  Medication Sig  . acetaminophen (TYLENOL) 500 MG tablet Take 500 mg by mouth every 6 (six) hours as needed for moderate pain or headache.  . celecoxib (CELEBREX) 100 MG capsule TAKE 1 CAPSULE BY MOUTH TWICE A DAY (Patient taking differently: Take 100 mg by mouth 2 (two) times daily. )  . ELIQUIS 5 MG TABS tablet TAKE 1 TABLET BY MOUTH TWICE A DAY (Patient taking differently: Take 5 mg by mouth 2 (two) times daily. )  . metoprolol succinate (TOPROL-XL) 50 MG 24 hr tablet TAKE 1 TABLET BY MOUTH EVERY DAY (Patient taking differently: Take 50 mg by mouth at bedtime. )  . Polyethyl Glycol-Propyl Glycol (SYSTANE OP) Place 1 drop into both eyes daily as needed (dry eyes).  . propafenone (RYTHMOL) 300 MG tablet TAKE 1 TABLET (300 MG TOTAL) BY MOUTH 2 (TWO) TIMES DAILY.  Marland Kitchen zolpidem (AMBIEN) 10 MG tablet Take 0.5 tablets (5 mg total) by mouth at bedtime. TAKE ONE-HALF TABLET BY MOUTH AT BEDTIME AS NEEDED (Patient taking differently: Take 5 mg by mouth at bedtime as needed for sleep. )  . [DISCONTINUED] losartan (COZAAR) 50 MG tablet Take 100 mg by mouth daily.  . [DISCONTINUED] losartan (COZAAR) 50 MG tablet Take 50 mg by mouth daily.     Allergies:   Codeine, Other, and Oxycodone   Social History   Tobacco Use  . Smoking status: Never Smoker  . Smokeless tobacco: Never Used  Substance Use Topics  . Alcohol use: Not Currently    Comment: rarely  . Drug use: No     Family Hx: The patient's family history includes Breast cancer in her mother; Colon cancer (age of onset: 94) in her cousin; Diabetes in an other family member; Heart disease in her father.  ROS:   Please see the history of present illness.     All other systems reviewed and are negative.    Prior CV studies:   The following studies were reviewed today:  none  Labs/Other Tests and Data Reviewed:    EKG:  No ECG reviewed.  Recent Labs: 12/25/2018: ALT 18; TSH 1.190 09/21/2019: BUN 18; Creatinine, Ser 0.84; Hemoglobin 11.4; Platelets 191; Potassium 4.4; Sodium 139   Recent Lipid Panel Lab Results  Component Value Date/Time   CHOL 187 12/25/2018 02:15 PM   TRIG 107 12/25/2018 02:15 PM  HDL 61 12/25/2018 02:15 PM   CHOLHDL 3.1 12/25/2018 02:15 PM   CHOLHDL 3.8 09/24/2015 12:14 PM   LDLCALC 105 (H) 12/25/2018 02:15 PM   LDLDIRECT 137.0 08/10/2010 08:45 AM    Wt Readings from Last 3 Encounters:  11/11/19 177 lb (80.3 kg)  09/20/19 182 lb 1.6 oz (82.6 kg)  09/17/19 182 lb (82.6 kg)     Objective:    Vital Signs:  BP (!) 165/83   Pulse 61   Ht 5\' 3"  (1.6 m)   Wt 177 lb (80.3 kg)   BMI 31.35 kg/m    VITAL SIGNS:  reviewed  ASSESSMENT & PLAN:    1. Paroxysmal atrial fibrillation. This is generally well controlled on  Rythmol. She is on chronic anticoagulation with Eliquis. On Toprol. Ecg looks OK today.  Continue current therapy  2. Hypertension. Blood pressure is high today. Recommend increasing losartan to 100 mg daily. Monitor BP. If it remains high may need to add chlorthalidone.   COVID-19 Education: The signs and symptoms of COVID-19 were discussed with the patient and how to seek care for testing (follow up with PCP or arrange E-visit).  The importance of social distancing was discussed today.  Time:   Today, I have spent 10 minutes with the patient with telehealth technology discussing the above problems.     Medication Adjustments/Labs and Tests Ordered: Current medicines are reviewed at length with the patient today.  Concerns regarding medicines are outlined above.   Tests Ordered: No orders of the defined types were placed in this encounter.   Medication Changes: No orders of the defined types were placed in this encounter.   Follow  Up:  In Person in 6 month(s)  Signed, Tyreek Clabo Martinique, MD  11/11/2019 8:29 AM    [place]

## 2019-11-07 NOTE — Telephone Encounter (Signed)
Message sent to pharmacy PCP to refill.

## 2019-11-11 ENCOUNTER — Encounter: Payer: Self-pay | Admitting: Cardiology

## 2019-11-11 ENCOUNTER — Ambulatory Visit: Payer: Medicare Other | Admitting: Cardiology

## 2019-11-11 ENCOUNTER — Telehealth (INDEPENDENT_AMBULATORY_CARE_PROVIDER_SITE_OTHER): Payer: Medicare Other | Admitting: Cardiology

## 2019-11-11 VITALS — BP 165/83 | HR 61 | Ht 63.0 in | Wt 177.0 lb

## 2019-11-11 DIAGNOSIS — I480 Paroxysmal atrial fibrillation: Secondary | ICD-10-CM

## 2019-11-11 DIAGNOSIS — R262 Difficulty in walking, not elsewhere classified: Secondary | ICD-10-CM | POA: Diagnosis not present

## 2019-11-11 DIAGNOSIS — M6281 Muscle weakness (generalized): Secondary | ICD-10-CM | POA: Diagnosis not present

## 2019-11-11 DIAGNOSIS — I10 Essential (primary) hypertension: Secondary | ICD-10-CM

## 2019-11-11 DIAGNOSIS — M25561 Pain in right knee: Secondary | ICD-10-CM | POA: Diagnosis not present

## 2019-11-11 MED ORDER — LOSARTAN POTASSIUM 100 MG PO TABS: 100.0000 mg | ORAL_TABLET | Freq: Every day | ORAL | 3 refills | Status: DC

## 2019-11-11 NOTE — Patient Instructions (Addendum)
Increase losartan to 100 mg daily.   Call in April to schedule July appointment

## 2019-11-12 ENCOUNTER — Other Ambulatory Visit: Payer: Self-pay

## 2019-11-12 ENCOUNTER — Encounter: Payer: Self-pay | Admitting: Orthopaedic Surgery

## 2019-11-12 ENCOUNTER — Ambulatory Visit (INDEPENDENT_AMBULATORY_CARE_PROVIDER_SITE_OTHER): Payer: Medicare Other | Admitting: Orthopaedic Surgery

## 2019-11-12 DIAGNOSIS — Z96651 Presence of right artificial knee joint: Secondary | ICD-10-CM

## 2019-11-12 NOTE — Progress Notes (Signed)
HPI: [name] returns today 7 weeks status post right total knee arthroplasty.  She is doing well with physical therapy.  Feels her range of motion strength improving.  She does feel that her knee is slightly bowed compared to the other knee.  Left total knee is doing well.  Otherwise no complaints.  Physical exam: Right knee no instability valgus varus stressing.  Seated she does have a slight valgus deformity.  When standing both knees appears straight.  Anterior drawer no significant translation.  Right knee full extension flexion to 110 degrees.  Right calf supple nontender.  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  She can transition to a home exercise program as she and therapist feel it is appropriate.  Asked her to wear a shoe with an arch support if she is wearing flat shoes today and see if that helps her as far as her overall feel of alignment.  We will see her back in 4 weeks to check her progress.  Questions were encouraged and answered

## 2019-11-27 ENCOUNTER — Other Ambulatory Visit: Payer: Self-pay

## 2019-11-27 ENCOUNTER — Ambulatory Visit: Payer: Medicare Other | Attending: Internal Medicine

## 2019-11-27 DIAGNOSIS — Z23 Encounter for immunization: Secondary | ICD-10-CM

## 2019-11-27 NOTE — Progress Notes (Signed)
   Covid-19 Vaccination Clinic  Name:  [name]    MRN: [id number] DOB: [birth year]  11/27/2019  [name] was observed post Covid-19 immunization for 15 minutes without incidence. She was provided with Vaccine Information Sheet and instruction to access the V-Safe system.   [name] was instructed to call 911 with any severe reactions post vaccine: Marland Kitchen Difficulty breathing  . Swelling of your face and throat  . A fast heartbeat  . A bad rash all over your body  . Dizziness and weakness    Immunizations Administered    Name Date Dose VIS Date Route   Pfizer COVID-19 Vaccine 11/27/2019  9:06 AM 0.3 mL 10/18/2019 Intramuscular   Manufacturer: [employer]   Lot: [id number]   [place]: [id number]

## 2019-12-02 ENCOUNTER — Other Ambulatory Visit: Payer: Self-pay | Admitting: Cardiology

## 2019-12-10 ENCOUNTER — Ambulatory Visit (INDEPENDENT_AMBULATORY_CARE_PROVIDER_SITE_OTHER): Payer: Medicare Other

## 2019-12-10 ENCOUNTER — Ambulatory Visit (INDEPENDENT_AMBULATORY_CARE_PROVIDER_SITE_OTHER): Payer: Medicare Other | Admitting: Orthopaedic Surgery

## 2019-12-10 ENCOUNTER — Encounter: Payer: Self-pay | Admitting: Orthopaedic Surgery

## 2019-12-10 ENCOUNTER — Other Ambulatory Visit: Payer: Self-pay

## 2019-12-10 DIAGNOSIS — M25561 Pain in right knee: Secondary | ICD-10-CM | POA: Diagnosis not present

## 2019-12-10 NOTE — Progress Notes (Signed)
Office Visit Note   Patient: [name]           Date of Birth: 03/27/1946           MRN: [id number] Visit Date: 12/10/2019              Requested by: Lucille Passy, MD [place],  [place] [id number] PCP: Lucille Passy, MD   Assessment & Plan: Visit Diagnoses:  1. Right knee pain, unspecified chronicity     Plan: Reassurance is given by Dr. Ninfa Linden that her radiographs show well-seated right total knee without any complications.  Also reassurance is given that she is progressing well that the pain she is experiencing is normal at 11 weeks postop.  We will see her back in approximately 6 months.  No radiographs at that time unless clinically indicated.  Continue to work on range of motion strengthening the knee.  Follow-Up Instructions: Return in about 6 months (around 06/08/2020).   Orders:  Orders Placed This Encounter  Procedures  . XR Knee 1-2 Views Right   No orders of the defined types were placed in this encounter.     Procedures: No procedures performed   Clinical Data: No additional findings.   Subjective: Chief Complaint  Patient presents with  . Right Knee - Routine Post Op    HPI [name] returns today 11 weeks status post right total knee arthroplasty.  She states she still having pain in the knee states it can be very short-lived does not radiate beyond the knee.  She is concerned that something may be prolonging the knee due to the continued pain.  She is ambulating without any assistive device.  Pain is worse after PT.  No known injury to the knee.  Notes pulling-like sensation deep within the proximal incision. Review of Systems Negative for fevers or chills.  Objective: Vital Signs: There were no vitals taken for this visit.  Physical Exam Constitutional:      General: She is not in acute distress.    Appearance: She is not ill-appearing or diaphoretic.  Neurological:     Mental Status: She is alert.  Psychiatric:         Mood and Affect: Mood normal.     Ortho Exam Right knee full extension flexion to 110 115 degrees.  No abnormal warmth erythema or effusion.  Surgical incisions healing well no signs of infection.  Anterior drawer is negative. Specialty Comments:  No specialty comments available.  Imaging: No results found.   PMFS History: Patient Active Problem List   Diagnosis Date Noted  . Status post total right knee replacement 09/20/2019  . Psychophysiological insomnia 07/22/2019  . Status post total left knee replacement 05/31/2019  . Unilateral primary osteoarthritis, right knee 04/23/2019  . Unilateral primary osteoarthritis, left knee 04/23/2019  . Obesity due to excess calories with serious comorbidity 07/18/2018  . Chronic insomnia 07/18/2018  . Anxiety associated with depression 07/18/2018  . Morbid obesity due to excess calories ([place]) 04/25/2016  . OSA (obstructive sleep apnea) 04/25/2016  . Paroxysmal atrial fibrillation ([place]) 04/25/2016  . Obstructive sleep apnea 06/04/2015  . Insomnia w/ sleep apnea 06/04/2015  . Hypoxemia 06/04/2015  . Encounter for therapeutic drug monitoring 02/10/2015  . Insomnia, controlled 01/01/2014  . Skin lesion of breast 06/05/2013  . Urinary incontinence, mixed 06/05/2013  . Chronic anticoagulation 09/12/2012  . Atrial fibrillation ([place]) 01/10/2011  . Carotid stenosis 01/10/2011  . DYSPNEA 01/10/2011  . COLONIC  POLYPS, ADENOMATOUS, HX OF 11/04/2010  . HEMORRHOIDS-INTERNAL 11/03/2010  . HIATAL HERNIA 11/03/2010  . DIVERTICULAR DISEASE 11/03/2010  . RECTAL BLEEDING 11/03/2010  . FATTY LIVER DISEASE, HX OF 11/03/2010  . HYPERCHOLESTEROLEMIA 08/10/2010  . Other fatigue 12/22/2009  . CHOLELITHIASIS 08/06/2008  . Essential hypertension 07/25/2008  . INSOMNIA, CHRONIC 05/21/2007  . RESTLESS LEG SYNDROME 05/21/2007  . SLEEP APNEA 05/21/2007   Past Medical History:  Diagnosis Date  . Acute upper respiratory infections of unspecified site    . Arthritis   . Carotid bruit   . Carotid disease, bilateral ([place]) 01/31/2012   Mild noted on US carotid  . Cataracts, both eyes   . Chronic insomnia   . Diverticulosis    pt denies  . Dysrhythmia    a fib  . Fatty liver   . GERD (gastroesophageal reflux disease)   . Heart murmur    Has been told by an MD.  . Hiatal hernia    Denies  . History of colon polyps    Noted on Colonscopy  . History of gallstones   . Hyperlipidemia   . Hypertension   . Insomnia, controlled 01/01/2014  . Internal hemorrhoids   . Macular pucker, left eye    surgery planned 01-14-15  . Menopause    noticed in early 71's  . MR (mitral regurgitation) 09/10/2014   Mild, Noted on ECHO   . Other acute sinusitis   . Other malaise and fatigue   . Paroxysmal atrial fibrillation (HCC)    on meds  . Pneumonia    as a small child  . PONV (postoperative nausea and vomiting)   . Pulmonary hypertension ([place]) 09/10/2014   Mild, Noted on ECHO  . Pure hypercholesterolemia   . Restless leg syndrome   . Routine general medical examination at a health care facility   . Screening for lipoid disorders   . Sleep apnea    no c-pap  . Special screening for malignant neoplasms, colon   . Uterine fibroid 09/19/1999   Noted on pelvis ultrasound    Family History  Problem Relation Age of Onset  . Breast cancer Mother   . Heart disease Father   . Colon cancer Cousin 51  . Diabetes Other        cousins    Past Surgical History:  Procedure Laterality Date  . cardiolyte neg  6/06  . cardiovascular stress MRI  12/20/06   afib  . CHOLECYSTECTOMY    . COLONOSCOPY    . EYE SURGERY     cataract   Toric implant bil eyes  . KNEE ARTHROSCOPY     left  . ORIF- L distal radius  9/07   left wrist  . PARS PLANA VITRECTOMY W/ REPAIR OF MACULAR HOLE     left eye  . torn meniscus     bil knees    . TOTAL KNEE ARTHROPLASTY Left 05/31/2019   Procedure: LEFT TOTAL KNEE ARTHROPLASTY;  Surgeon: Mcarthur Rossetti, MD;   Location: [facility];  Service: Orthopedics;  Laterality: Left;  . TOTAL KNEE ARTHROPLASTY Right 09/20/2019   Procedure: RIGHT TOTAL KNEE ARTHROPLASTY;  Surgeon: Mcarthur Rossetti, MD;  Location: [facility];  Service: Orthopedics;  Laterality: Right;  . TUBAL LIGATION    . UPPER GI ENDOSCOPY     Social History   Occupational History  . Occupation: Product manager: [employer]  Tobacco Use  . Smoking status: Never Smoker  . Smokeless tobacco: Never  Used  Substance and Sexual Activity  . Alcohol use: Not Currently    Comment: rarely  . Drug use: No  . Sexual activity: Never

## 2019-12-18 ENCOUNTER — Ambulatory Visit: Payer: Medicare Other | Attending: Internal Medicine

## 2019-12-18 DIAGNOSIS — Z23 Encounter for immunization: Secondary | ICD-10-CM | POA: Insufficient documentation

## 2019-12-18 NOTE — Progress Notes (Signed)
   Covid-19 Vaccination Clinic  Name:  [name]    MRN: [id number] DOB: November 11, 1945  12/18/2019  [name] was observed post Covid-19 immunization for 15 minutes without incidence. She was provided with Vaccine Information Sheet and instruction to access the V-Safe system.   [name] was instructed to call 911 with any severe reactions post vaccine: Marland Kitchen Difficulty breathing  . Swelling of your face and throat  . A fast heartbeat  . A bad rash all over your body  . Dizziness and weakness    Immunizations Administered    Name Date Dose VIS Date Route   Pfizer COVID-19 Vaccine 12/18/2019 11:21 AM 0.3 mL 10/18/2019 Intramuscular   Manufacturer: [employer]   Lot: [id number]   [place]: [id number]

## 2019-12-23 ENCOUNTER — Telehealth: Payer: Self-pay | Admitting: *Deleted

## 2019-12-23 NOTE — Care Plan (Signed)
RNCM call to patient to check status at 90 days post-op from her recent Right TKA. She states she is still in pain with standing for extended periods and rising from sitting with both knees. She feels it is definitely related to the cold/rainy winter and is hoping for improvement once it gets warmer. Reminded to call office with any questions or concerns. Patient appreciative of call.

## 2019-12-23 NOTE — Telephone Encounter (Signed)
90 day Ortho bundle call completed. 

## 2020-01-20 ENCOUNTER — Other Ambulatory Visit: Payer: Self-pay

## 2020-01-20 ENCOUNTER — Encounter: Payer: Self-pay | Admitting: Family Medicine

## 2020-01-20 ENCOUNTER — Ambulatory Visit (INDEPENDENT_AMBULATORY_CARE_PROVIDER_SITE_OTHER): Payer: Medicare Other | Admitting: Family Medicine

## 2020-01-20 VITALS — BP 168/86 | HR 74 | Temp 97.2°F | Ht 63.0 in | Wt 184.2 lb

## 2020-01-20 DIAGNOSIS — G4700 Insomnia, unspecified: Secondary | ICD-10-CM

## 2020-01-20 DIAGNOSIS — F5104 Psychophysiologic insomnia: Secondary | ICD-10-CM

## 2020-01-20 MED ORDER — ZOLPIDEM TARTRATE 5 MG PO TABS: 5.0000 mg | ORAL_TABLET | Freq: Every day | ORAL | 1 refills | Status: DC

## 2020-01-20 NOTE — Progress Notes (Signed)
PATIENT: [name] DOB: 10-19-1946  REASON FOR VISIT: follow up HISTORY FROM: patient  Chief Complaint  Patient presents with  . Chronic Insomnia    Reports stopping trazodone because it was not helpful for sleep. She is taking Ambien 10mg , 0.5 tab each night.   . Headache    Her headaches have improved. No issues now.     HISTORY OF PRESENT ILLNESS: Today 01/20/20 [name] is a 74 y.o. female here today for follow up for insomnia. She continues Ambien 5mg  at bedtime. She feels that Ambien is the only medication that seems to help. She does try to take less when she can. She is recovering from right knee replacement. She had a left knee replacement in July. She is recovering well. She does admit to forgetting her BP meds this morning. BP is usually 140's/80's.    HISTORY: (copied from [name]'s note on 07/22/2019)  9-14- 2020, RV with [name], a 74 year old hearing impaired Caucasian female patient.  [name] lost her husband and is now living by herself, she has not to her knowledge contracted any COVID or was exposed to.  She has been followed here for years for obstructive sleep apnea with sleep hypoxemia and has also chronic insomnia.  She is not daytime excessively sleepy endorsing the Epworth score at 1 point today the fatigue severity at 18 points and the geriatric depression score at 4 out of 15 points.  Her hearing aids no longer serve her well and therefore we have switched to a face shield on my part to that she can read my lips.medicare no longer paid for her machine. No longer on CPAP. Pharnacy is now [facility]- she wants Ambien refilled, but understands 5 mg is her age limit. She underwent knee replacement. Post surgery, left knee- &-24-2020.,she used oxycodone and tramadol, but couldn't  tolerate these, she lost appetit , was latently nauseated.  She wants to use Ambien instead.    07-18-2018, Rv with [name] -a 74 year old female  patient who has become her husbands caretaker.   She endorses today the fatigue severity scale at 29 points, and her Epworth sleepiness score at 2 points.  The patient has suffered for a long time but is chronic insomnia, and underlying condition that has been treated with Ambien at 10 mg.  And she continues to have a.  Of wakefulness somewhere between 2 and 3 AM, this is also correlated to her Fitbit recording.  Her husband continues to receive chemotherapy, she had been successful in losing some weight but has been stable, certainly stress related.    01-15-2018, RV for this 74 year old caucasian married female with chronic insomnia- and who has learnt to live with it. She reports bedtime is around 11.30 pm, sleeping for about 7 hours total.  Her husband is on chemotherapy and tolerating it well, he takes naps in daytime, she doesn't.  She has lost weight again, and has not gained back over the last 12 month. She reports her cardiologist is happy with her, too. Her endorsing the Epworth score at only 2 points, FSS 23,   Today 12/27/16: [name] is a 74 year old female with a history of chronic insomnia. She returns today for follow-up. At her last visit she was advised that she did not have to use the CPAP as it was not offering her much benefit. She states that she's been using Ambien 10 mg at bedtime. She states  that this is the only medication that has offered her some benefit. She states that she still wakes up numerous times a night but she is able to go back to sleep. She reports on the rare occasion she will have a night that she cannot sleep at all. She was referred to Emily. Casimiro Choi but states that she never made an appointment. She states that her husband has had a lot of health issues and may being consumed with that for the last several months. She states that she understands that she has a lot of anxiety but she does not feel depressed. She returns today for an evaluation.  HISTORY   01-20-2012 per Emily. Brett Choi' notes: Emily Headings Wrightis a 60femaleand seen here in referral from Emily. Francella Choi chronic insomnia. The patient will be evaluated for organic reasons of insomnia.   The patient was last seen on 01-20-12 and has lost a lot of weight in the meantime. After trazodone and Remeron did fail to produce satisfying results for now she will sometimes take a Benadryl or Tylenol PM to assist her with her sleep initiation. She thinks nocturia  was never the main problem. The patient states that she has lost a lot of weight intentionally and that this has helped certainly some of her other health issues including hypertension. She was diagnosed with atrial fibrillation in the past has been on chronic anticoagulation. She reported that topiramate caused her to be drowsy and daytime when taken in the morning for that reason we will change the intake time 2 PM this may assist with her sleepiness as well as was her headaches. It has also helped her to lose weight.  At bedtime routines were reviewed : the patient was to bed around 11 PM rises at 6:30 AM depending on how much sleep she got.  She did have amnestic spells from after some nights when using Ambien- but she sleeps all much better on Ambien but on the other medications.  The onset of her insomnia was related to the development of menopause but she only has really hot flashes now- she uses Ambien chronically for 2 decades.   Her bedroom is described as quiet, cool and dark she shares a bedroom with her husband, who is very sick.    Interval history , 01-13-15 CD, [name] is seen here following up on her sleep study from 01-29-14 the study showed a patient with normal heart rate some prolonged oxygen desaturation and very mild apnea. A problem for the patient was that she couldn't really sleep through the night she was 53 minutes awake during her sleep study. 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 Emily. Zenia Choi had  originally been referred to the patient for insomnia evaluation. Based on the hypoxemia and the mix of a mild sleep apnea , she could be invited the patient to come in for a CPAP titration split-night study. The patient is beginning cataracts and she has just been seen by optometry, she has been insomnic again. Testing was suspected to reveal also retinal disease and she was referred to Emily. Zadie Rhine a retinal specialist. She has a macular pucker. Surgery is scheduled for tomorrow morning 01-14-15. She is in need of a refill for Ambien which he uses to treat her chronic insomnia. In spite of this the last week had been difficult for her I think because of anxiety in anticipation of the surgery. Her fatigue severity score is up to 40 points and her fatigue her Epworth sleepiness  score is normal at 2 points.  Her sleep is not restorative, still. She will have an overnight pulse-oximetry after her surgery is done. I will schedule this though [facility]. Rv with NP or me in May-June 2016.  06-04-15, Mrs. Vinluan is here status post cataract ectomy bilaterally and surgery to the retina. As I had quoted in my last visit she underwent a sleep study on 3-20 5-15. She continued to complain of nonrestorative not refreshing sleep. She feels however that she has started to manage better with that and her fatigue is no longer as excessive. She endorsed the fatigue severity score of 31 points and the Epworth sleepiness score at 3 points. She's not likely to fall asleep unintended. She doesn't struggle with sleep attacks. In her geriatric depression score she endorsed only 1 point which is not indicative of suffering from clinical depression. She wears a fit bit, which records her pulse, she wore a ONO, and had 2 hours of low oxygen levels, she will need a referral to pulmonology for further evaluation or will proceed with a CPAP auto-titration, to see if correcting the mild apnea is an option. She needs to lose weight.    04-25-2016 Mrs. Spadea was last seen by mypractitioner Cecille Rubin, and referred for a new CPAP titration on 01/27/2016. She was again diagnosed with insomnia and a low sleep efficiency of only 72.5% CPAP was explored from 5 through 8 cm water. And an air-fit P 10 nasal pillow in extra small size was used. She still reports that she feels restless and uncomfortable just was having anything in her face or attached to her body. And of course the PLM arousals were not addressed by CPAP. She would like to sleep on her side but feels compelled to sleep in supine position so that the mass does not get as long.  The compliance data since May 30 show 14 out of 15 days of use she used to machine over 4 hours for only 11 out of 15 days. A 74% compliance. The average user time is 4 hours and 56 minutes. The set pressure was 8 cm water with 3 cm EPR and her residual AHI is 2.9 there is a significant reduction in her apnea index. However her baseline AHI was not that high in 2015 with an AHI of 9. I think given that the patient feels no improvement in her daytime alertness and actually more bothered by the machine and given that she has a mild apnea to begin with I would allow her to discontinue the use of CPAP she could change to a dental device is snoring bothers her, but she denies. She will concentrate on weight loss.   REVIEW OF SYSTEMS: Out of a complete 14 system review of symptoms, the patient complains only of the following symptoms, right knee pain, occasional headache and all other reviewed systems are negative.  ALLERGIES: Allergies  Allergen Reactions  . Codeine Nausea And Vomiting  . Other Nausea And Vomiting    general anesthesia  . Oxycodone Nausea And Vomiting    HOME MEDICATIONS: Outpatient Medications Prior to Visit  Medication Sig Dispense Refill  . acetaminophen (TYLENOL) 500 MG tablet Take 500 mg by mouth every 6 (six) hours as needed for moderate pain or headache.    .  celecoxib (CELEBREX) 100 MG capsule TAKE 1 CAPSULE BY MOUTH TWICE A DAY (Patient taking differently: Take 100 mg by mouth 2 (two) times daily. ) 180 capsule 1  . ELIQUIS 5  MG TABS tablet TAKE 1 TABLET BY MOUTH TWICE A DAY (Patient taking differently: Take 5 mg by mouth 2 (two) times daily. ) 60 tablet 8  . losartan (COZAAR) 100 MG tablet Take 1 tablet (100 mg total) by mouth daily. 90 tablet 3  . metoprolol succinate (TOPROL-XL) 50 MG 24 hr tablet TAKE 1 TABLET BY MOUTH EVERY DAY 90 tablet 3  . Polyethyl Glycol-Propyl Glycol (SYSTANE OP) Place 1 drop into both eyes daily as needed (dry eyes).    . propafenone (RYTHMOL) 300 MG tablet TAKE 1 TABLET (300 MG TOTAL) BY MOUTH 2 (TWO) TIMES DAILY. 180 tablet 3  . zolpidem (AMBIEN) 10 MG tablet Take 0.5 tablets (5 mg total) by mouth at bedtime. TAKE ONE-HALF TABLET BY MOUTH AT BEDTIME AS NEEDED (Patient taking differently: Take 5 mg by mouth at bedtime as needed for sleep. ) 45 tablet 1  . promethazine (PHENERGAN) 12.5 MG tablet TAKE 1 TABLET (12.5 MG TOTAL) BY MOUTH EVERY 6 (SIX) HOURS AS NEEDED FOR NAUSEA OR VOMITING. (Patient not taking: Reported on 11/11/2019) 20 tablet 0   No facility-administered medications prior to visit.    PAST MEDICAL HISTORY: Past Medical History:  Diagnosis Date  . Acute upper respiratory infections of unspecified site   . Arthritis   . Carotid bruit   . Carotid disease, bilateral ([facility]) 01/31/2012   Mild noted on US carotid  . Cataracts, both eyes   . Chronic insomnia   . Diverticulosis    pt denies  . Dysrhythmia    a fib  . Fatty liver   . GERD (gastroesophageal reflux disease)   . Heart murmur    Has been told by an MD.  . Hiatal hernia    Denies  . History of colon polyps    Noted on Colonscopy  . History of gallstones   . Hyperlipidemia   . Hypertension   . Insomnia, controlled 01/01/2014  . Internal hemorrhoids   . Macular pucker, left eye    surgery planned 01-14-15  . Menopause    noticed in early  75's  . MR (mitral regurgitation) 09/10/2014   Mild, Noted on ECHO   . Other acute sinusitis   . Other malaise and fatigue   . Paroxysmal atrial fibrillation ([facility])    on meds  . Pneumonia    as a small child  . PONV (postoperative nausea and vomiting)   . Pulmonary hypertension ([place]) 09/10/2014   Mild, Noted on ECHO  . Pure hypercholesterolemia   . Restless leg syndrome   . Routine general medical examination at a health care facility   . Screening for lipoid disorders   . Sleep apnea    no c-pap  . Special screening for malignant neoplasms, colon   . Uterine fibroid 09/19/1999   Noted on pelvis ultrasound    PAST SURGICAL HISTORY: Past Surgical History:  Procedure Laterality Date  . cardiolyte neg  6/06  . cardiovascular stress MRI  12/20/06   afib  . CHOLECYSTECTOMY    . COLONOSCOPY    . EYE SURGERY     cataract   Toric implant bil eyes  . KNEE ARTHROSCOPY     left  . ORIF- L distal radius  9/07   left wrist  . PARS PLANA VITRECTOMY W/ REPAIR OF MACULAR HOLE     left eye  . torn meniscus     bil knees    . TOTAL KNEE ARTHROPLASTY Left 05/31/2019   Procedure: LEFT TOTAL KNEE  ARTHROPLASTY;  Surgeon: Mcarthur Rossetti, MD;  Location: [facility];  Service: Orthopedics;  Laterality: Left;  . TOTAL KNEE ARTHROPLASTY Right 09/20/2019   Procedure: RIGHT TOTAL KNEE ARTHROPLASTY;  Surgeon: Mcarthur Rossetti, MD;  Location: [facility];  Service: Orthopedics;  Laterality: Right;  . TUBAL LIGATION    . UPPER GI ENDOSCOPY      FAMILY HISTORY: Family History  Problem Relation Age of Onset  . Breast cancer Mother   . Heart disease Father   . Colon cancer Cousin 42  . Diabetes Other        cousins    SOCIAL HISTORY: Social History   Socioeconomic History  . Marital status: Widowed    Spouse name: Sterling Big  . Number of children: 3  . Years of education: College  . Highest education level: Not on file  Occupational History  . Occupation: Product manager:  [employer]  Tobacco Use  . Smoking status: Never Smoker  . Smokeless tobacco: Never Used  Substance and Sexual Activity  . Alcohol use: Not Currently    Comment: rarely  . Drug use: No  . Sexual activity: Never  Other Topics Concern  . Not on file  Social History Narrative   Patient is widowed. Patient has three adult children.   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    Patient is right-handed.   Patient drinks two cups of caffeine daily.   Patient has a college education.   Social Determinants of Health   Financial Resource Strain:   . Difficulty of Paying Living Expenses:   Food Insecurity:   . Worried About Charity fundraiser in the Last Year:   . Arboriculturist in the Last Year:   Transportation Needs:   . Film/video editor (Medical):   Marland Kitchen Lack of Transportation (Non-Medical):   Physical Activity:   . Days of Exercise per Week:   . Minutes of Exercise per Session:   Stress:   . Feeling of Stress :   Social Connections:   . Frequency of Communication with Friends and Family:   . Frequency of Social Gatherings with Friends and Family:   . Attends Religious Services:   . Active Member of Clubs or Organizations:   . Attends Archivist Meetings:   Marland Kitchen Marital Status:   Intimate Partner Violence:   . Fear of Current or Ex-Partner:   . Emotionally Abused:   Marland Kitchen Physically Abused:   . Sexually Abused:       PHYSICAL EXAM  Vitals:   01/20/20 1055  BP: (!) 168/86  Pulse: 74  Temp: (!) 97.2 F (36.2 C)  Weight: 184 lb 3.2 oz (83.6 kg)  Height: 5\' 3"  (1.6 m)   Body mass index is 32.63 kg/m.  Generalized: Well developed, in no acute distress  Cardiology: normal rate and rhythm, no murmur noted Neurological examination  Mentation: Alert oriented to time, place, history taking. Follows all commands speech and language fluent Cranial nerve II-XII: Pupils were equal round reactive to light. Extraocular  movements were full, visual field were full on confrontational test. Facial sensation and strength were normal. Uvula tongue midline. Head turning and shoulder shrug  were normal and symmetric. Motor: The motor testing reveals 5 over 5 strength of all 4 extremities. Good symmetric motor tone is noted throughout.  Sensory: Sensory testing is intact to soft touch on all 4 extremities. No evidence of  extinction is noted.  Coordination: Cerebellar testing reveals good finger-nose-finger and heel-to-shin bilaterally.  Gait and station: Gait is normal.   DIAGNOSTIC DATA (LABS, IMAGING, TESTING) - I reviewed patient records, labs, notes, testing and imaging myself where available.  MMSE - Mini Mental State Exam 01/20/2016  Orientation to time 5  Orientation to Place 5  Registration 3  Attention/ Calculation 5  Recall 3  Language- name 2 objects 0  Language- repeat 1  Language- follow 3 step command 3  Language- read & follow direction 1  Write a sentence 0  Copy design 0  Total score 26     Lab Results  Component Value Date   WBC 11.6 (H) 09/21/2019   HGB 11.4 (L) 09/21/2019   HCT 35.2 (L) 09/21/2019   MCV 93.4 09/21/2019   PLT 191 09/21/2019      Component Value Date/Time   NA 139 09/21/2019 0301   NA 144 12/25/2018 1415   K 4.4 09/21/2019 0301   CL 110 09/21/2019 0301   CO2 23 09/21/2019 0301   GLUCOSE 129 (H) 09/21/2019 0301   BUN 18 09/21/2019 0301   BUN 17 12/25/2018 1415   CREATININE 0.84 09/21/2019 0301   CREATININE 1.09 (H) 10/18/2016 1003   CALCIUM 8.0 (L) 09/21/2019 0301   PROT 6.5 12/25/2018 1415   ALBUMIN 4.0 12/25/2018 1415   AST 15 12/25/2018 1415   ALT 18 12/25/2018 1415   ALKPHOS 94 12/25/2018 1415   BILITOT 0.4 12/25/2018 1415   GFRNONAA >60 09/21/2019 0301   GFRAA >60 09/21/2019 0301   Lab Results  Component Value Date   CHOL 187 12/25/2018   HDL 61 12/25/2018   [place] 105 (H) 12/25/2018   LDLDIRECT 137.0 08/10/2010   TRIG 107 12/25/2018    CHOLHDL 3.1 12/25/2018   No results found for: HGBA1C No results found for: VITAMINB12 Lab Results  Component Value Date   TSH 1.190 12/25/2018       ASSESSMENT AND PLAN 74 y.o. year old female  has a past medical history of Acute upper respiratory infections of unspecified site, Arthritis, Carotid bruit, Carotid disease, bilateral ([place]) (01/31/2012), Cataracts, both eyes, Chronic insomnia, Diverticulosis, Dysrhythmia, Fatty liver, GERD (gastroesophageal reflux disease), Heart murmur, Hiatal hernia, History of colon polyps, History of gallstones, Hyperlipidemia, Hypertension, Insomnia, controlled (01/01/2014), Internal hemorrhoids, Macular pucker, left eye, Menopause, MR (mitral regurgitation) (09/10/2014), Other acute sinusitis, Other malaise and fatigue, Paroxysmal atrial fibrillation ([place]), Pneumonia, PONV (postoperative nausea and vomiting), Pulmonary hypertension ([place]) (09/10/2014), Pure hypercholesterolemia, Restless leg syndrome, Routine general medical examination at a health care facility, Screening for lipoid disorders, Sleep apnea, Special screening for malignant neoplasms, colon, and Uterine fibroid (09/19/1999). here with     ICD-10-CM   1. Psychophysiological insomnia  F51.04     Mrs Salasar continues to do well with Ambien 5mg  at bedtime. She feels that she can use less some nights. We will change tablet from 10mg  to 5mg  tablet. She will continues one at bedtime and may take 1/2 tablet on nights she feels she can use less. She may also use Melatonin 5mg  if needed. She will continue close follow up with PCP as well. I have offered to recheck her BP in office but she feels it will be elevated. She was encouraged to take BP meds as soon as possible. Fortunately, she is asymptomatic today. She will follow up with me in 6 months, sooner if needed. She verbalizes understanding and agreement with this plan.    No orders of  the defined types were placed in this encounter.    Meds ordered  this encounter  Medications  . zolpidem (AMBIEN) 5 MG tablet    Sig: Take 1 tablet (5 mg total) by mouth at bedtime. TAKE ONE-HALF TABLET BY MOUTH AT BEDTIME AS NEEDED    Dispense:  90 tablet    Refill:  1    Not to exceed 5 additional fills before 07/29/2019    Order Specific Question:   Supervising Provider    Answer:   Melvenia Beam [id number]      I spent 15 minutes with the patient. 50% of this time was spent counseling and educating patient on plan of care and medications.    Debbora Presto, FNP-C 01/20/2020, 12:48 PM [facility] [address] [place], [place] [address] [phone]

## 2020-01-20 NOTE — Patient Instructions (Addendum)
Continue Ambien 5mg  daily at bedtime. Try to take 1/2 tablet when you can.   Follow up in 6 months   Insomnia Insomnia is a sleep disorder that makes it difficult to fall asleep or stay asleep. Insomnia can cause fatigue, low energy, difficulty concentrating, mood swings, and poor performance at work or school. There are three different ways to classify insomnia:  Difficulty falling asleep.  Difficulty staying asleep.  Waking up too early in the morning. Any type of insomnia can be long-term (chronic) or short-term (acute). Both are common. Short-term insomnia usually lasts for three months or less. Chronic insomnia occurs at least three times a week for longer than three months. What are the causes? Insomnia may be caused by another condition, situation, or substance, such as:  Anxiety.  Certain medicines.  Gastroesophageal reflux disease (GERD) or other gastrointestinal conditions.  Asthma or other breathing conditions.  Restless legs syndrome, sleep apnea, or other sleep disorders.  Chronic pain.  Menopause.  Stroke.  Abuse of alcohol, tobacco, or illegal drugs.  Mental health conditions, such as depression.  Caffeine.  Neurological disorders, such as Alzheimer's disease.  An overactive thyroid (hyperthyroidism). Sometimes, the cause of insomnia may not be known. What increases the risk? Risk factors for insomnia include:  Gender. Women are affected more often than men.  Age. Insomnia is more common as you get older.  Stress.  Lack of exercise.  Irregular work schedule or working night shifts.  Traveling between different time zones.  Certain medical and mental health conditions. What are the signs or symptoms? If you have insomnia, the main symptom is having trouble falling asleep or having trouble staying asleep. This may lead to other symptoms, such as:  Feeling fatigued or having low energy.  Feeling nervous about going to sleep.  Not feeling  rested in the morning.  Having trouble concentrating.  Feeling irritable, anxious, or depressed. How is this diagnosed? This condition may be diagnosed based on:  Your symptoms and medical history. Your health care provider may ask about: ? Your sleep habits. ? Any medical conditions you have. ? Your mental health.  A physical exam. How is this treated? Treatment for insomnia depends on the cause. Treatment may focus on treating an underlying condition that is causing insomnia. Treatment may also include:  Medicines to help you sleep.  Counseling or therapy.  Lifestyle adjustments to help you sleep better. Follow these instructions at home: Eating and drinking   Limit or avoid alcohol, caffeinated beverages, and cigarettes, especially close to bedtime. These can disrupt your sleep.  Do not eat a large meal or eat spicy foods right before bedtime. This can lead to digestive discomfort that can make it hard for you to sleep. Sleep habits   Keep a sleep diary to help you and your health care provider figure out what could be causing your insomnia. Write down: ? When you sleep. ? When you wake up during the night. ? How well you sleep. ? How rested you feel the next day. ? Any side effects of medicines you are taking. ? What you eat and drink.  Make your bedroom a dark, comfortable place where it is easy to fall asleep. ? Put up shades or blackout curtains to block light from outside. ? Use a white noise machine to block noise. ? Keep the temperature cool.  Limit screen use before bedtime. This includes: ? Watching TV. ? Using your smartphone, tablet, or computer.  Stick to a routine that  includes going to bed and waking up at the same times every day and night. This can help you fall asleep faster. Consider making a quiet activity, such as reading, part of your nighttime routine.  Try to avoid taking naps during the day so that you sleep better at night.  Get out of  bed if you are still awake after 15 minutes of trying to sleep. Keep the lights down, but try reading or doing a quiet activity. When you feel sleepy, go back to bed. General instructions  Take over-the-counter and prescription medicines only as told by your health care provider.  Exercise regularly, as told by your health care provider. Avoid exercise starting several hours before bedtime.  Use relaxation techniques to manage stress. Ask your health care provider to suggest some techniques that may work well for you. These may include: ? Breathing exercises. ? Routines to release muscle tension. ? Visualizing peaceful scenes.  Make sure that you drive carefully. Avoid driving if you feel very sleepy.  Keep all follow-up visits as told by your health care provider. This is important. Contact a health care provider if:  You are tired throughout the day.  You have trouble in your daily routine due to sleepiness.  You continue to have sleep problems, or your sleep problems get worse. Get help right away if:  You have serious thoughts about hurting yourself or someone else. If you ever feel like you may hurt yourself or others, or have thoughts about taking your own life, get help right away. You can go to your nearest emergency department or call:  Your local emergency services (911 in the [place]  A suicide crisis helpline, such as the [facility] at (438)301-2794. This is open 24 hours a day. Summary  Insomnia is a sleep disorder that makes it difficult to fall asleep or stay asleep.  Insomnia can be long-term (chronic) or short-term (acute).  Treatment for insomnia depends on the cause. Treatment may focus on treating an underlying condition that is causing insomnia.  Keep a sleep diary to help you and your health care provider figure out what could be causing your insomnia. This information is not intended to replace advice given to you by your health  care provider. Make sure you discuss any questions you have with your health care provider. Document Revised: 10/06/2017 Document Reviewed: 08/03/2017 Elsevier Patient Education  2020 [employer].

## 2020-01-21 ENCOUNTER — Other Ambulatory Visit: Payer: Self-pay | Admitting: Cardiology

## 2020-01-21 DIAGNOSIS — I480 Paroxysmal atrial fibrillation: Secondary | ICD-10-CM

## 2020-01-22 MED ORDER — ZOLPIDEM TARTRATE 5 MG PO TABS: 5.0000 mg | ORAL_TABLET | Freq: Every day | ORAL | 1 refills | Status: DC

## 2020-01-22 NOTE — Addendum Note (Signed)
Addended by: Debbora Presto L on: 01/22/2020 07:57 AM   Modules accepted: Orders

## 2020-02-12 ENCOUNTER — Encounter: Payer: Self-pay | Admitting: Family Medicine

## 2020-02-12 ENCOUNTER — Other Ambulatory Visit: Payer: Self-pay

## 2020-02-12 ENCOUNTER — Ambulatory Visit (INDEPENDENT_AMBULATORY_CARE_PROVIDER_SITE_OTHER): Payer: Medicare Other | Admitting: Family Medicine

## 2020-02-12 VITALS — BP 180/80 | HR 61 | Temp 97.9°F | Ht 63.0 in | Wt 187.5 lb

## 2020-02-12 DIAGNOSIS — R079 Chest pain, unspecified: Secondary | ICD-10-CM

## 2020-02-12 DIAGNOSIS — B029 Zoster without complications: Secondary | ICD-10-CM

## 2020-02-12 DIAGNOSIS — I10 Essential (primary) hypertension: Secondary | ICD-10-CM | POA: Diagnosis not present

## 2020-02-12 MED ORDER — GABAPENTIN 100 MG PO CAPS: 100.0000 mg | ORAL_CAPSULE | Freq: Two times a day (BID) | ORAL | 0 refills | Status: DC

## 2020-02-12 MED ORDER — AMLODIPINE BESYLATE 2.5 MG PO TABS: 2.5000 mg | ORAL_TABLET | Freq: Every day | ORAL | 3 refills | Status: DC

## 2020-02-12 MED ORDER — VALACYCLOVIR HCL 1 G PO TABS: 1000.0000 mg | ORAL_TABLET | Freq: Three times a day (TID) | ORAL | 0 refills | Status: DC

## 2020-02-12 NOTE — Progress Notes (Signed)
This visit was conducted in person.  BP (!) 180/80 (BP Location: Left Arm, Patient Position: Sitting, Cuff Size: Large)   Pulse 61   Temp 97.9 F (36.6 C) (Temporal)   Ht 5\' 3"  (1.6 m)   Wt 187 lb 8 oz (85 kg)   SpO2 96%   BMI 33.21 kg/m    CC: multiple concerns Subjective:    Patient ID: [name], female    DOB: [birth year], 74 y.o.   MRN: [id number]  HPI: [name] is a 74 y.o. female presenting on 02/12/2020 for Hypertension (C/o recent higher than normal BP readings.  On 02/08/20, reading 215/71.  Then 02/09/20, reading 180s/90s.  This morning, 150s.  Has also had HA. ) and Rash (C/o rash on bilateral arms and upper back.  Areas are painful and itchy.  Noticed about 1 wk ago.)   Patient of Dr Hulen Shouts presents today.   Elevated BP readings this past weekend up to 215/71, did drop the next morning, associated with bitemporal headache. Denies dyspnea, leg swelling, vision changes. Notes atacand was what worked best for her previously as far as BP control.   New rash x 1 week - started on R posterior shoulder associated with R chest discomfort. Points to R chest wall lateral to sternum. Treated with steroid cream with some benefit. Some stabbing discomfort, some burning and itching, also tender to the touch.   She is on eliquis for h/o PAF. She is also on propafenone (Rythmol).  managed through cardiology.   She completed covid Pfizer vaccines. Completed zostavax 2012.  Last saw PCP 2018.      Relevant past medical, surgical, family and social history reviewed and updated as indicated. Interim medical history since our last visit reviewed. Allergies and medications reviewed and updated. Outpatient Medications Prior to Visit  Medication Sig Dispense Refill  . acetaminophen (TYLENOL) 500 MG tablet Take 500 mg by mouth every 6 (six) hours as needed for moderate pain or headache.    . celecoxib (CELEBREX) 100 MG capsule TAKE 1 CAPSULE BY MOUTH TWICE A DAY 180 capsule 2    . ELIQUIS 5 MG TABS tablet TAKE 1 TABLET BY MOUTH TWICE A DAY (Patient taking differently: Take 5 mg by mouth 2 (two) times daily. ) 60 tablet 8  . losartan (COZAAR) 100 MG tablet Take 1 tablet (100 mg total) by mouth daily. 90 tablet 3  . metoprolol succinate (TOPROL-XL) 50 MG 24 hr tablet TAKE 1 TABLET BY MOUTH EVERY DAY 90 tablet 3  . propafenone (RYTHMOL) 300 MG tablet TAKE 1 TABLET (300 MG TOTAL) BY MOUTH 2 (TWO) TIMES DAILY. 180 tablet 3  . zolpidem (AMBIEN) 5 MG tablet Take 1 tablet (5 mg total) by mouth at bedtime. 90 tablet 1  . Polyethyl Glycol-Propyl Glycol (SYSTANE OP) Place 1 drop into both eyes daily as needed (dry eyes).     No facility-administered medications prior to visit.     Per HPI unless specifically indicated in ROS section below Review of Systems Objective:    BP (!) 180/80 (BP Location: Left Arm, Patient Position: Sitting, Cuff Size: Large)   Pulse 61   Temp 97.9 F (36.6 C) (Temporal)   Ht 5\' 3"  (1.6 m)   Wt 187 lb 8 oz (85 kg)   SpO2 96%   BMI 33.21 kg/m   Wt Readings from Last 3 Encounters:  02/12/20 187 lb 8 oz (85 kg)  01/20/20 184 lb 3.2 oz (83.6 kg)  11/11/19 177  lb (80.3 kg)    Physical Exam Vitals and nursing note reviewed.  Constitutional:      Appearance: Normal appearance. She is not ill-appearing or diaphoretic.  Cardiovascular:     Rate and Rhythm: Normal rate and regular rhythm.     Pulses: Normal pulses.     Heart sounds: Murmur (2/6 systolic) present.  Pulmonary:     Effort: Pulmonary effort is normal. No respiratory distress.     Breath sounds: Normal breath sounds. No wheezing, rhonchi or rales.  Skin:    General: Skin is warm and dry.     Findings: Erythema and rash present.     Comments: Cluster of papules to right posterior shoulder just above shoulder blade, rash does not cross midline  Neurological:     Mental Status: She is alert.  Psychiatric:        Mood and Affect: Mood normal.        Behavior: Behavior normal.        Results for orders placed or performed during the hospital encounter of 09/20/19  Protime-INR  Result Value Ref Range   Prothrombin Time 15.4 (H) 11.4 - 15.2 seconds   INR 1.2 0.8 - 1.2  CBC  Result Value Ref Range   WBC 11.6 (H) 4.0 - 10.5 K/uL   RBC 3.77 (L) 3.87 - 5.11 MIL/uL   Hemoglobin 11.4 (L) 12.0 - 15.0 g/dL   HCT 35.2 (L) 36.0 - 46.0 %   MCV 93.4 80.0 - 100.0 fL   MCH 30.2 26.0 - 34.0 pg   MCHC 32.4 30.0 - 36.0 g/dL   RDW 12.7 11.5 - 15.5 %   Platelets 191 150 - 400 K/uL   nRBC 0.0 0.0 - 0.2 %  Basic metabolic panel  Result Value Ref Range   Sodium 139 135 - 145 mmol/L   Potassium 4.4 3.5 - 5.1 mmol/L   Chloride 110 98 - 111 mmol/L   CO2 23 22 - 32 mmol/L   Glucose, Bld 129 (H) 70 - 99 mg/dL   BUN 18 8 - 23 mg/dL   Creatinine, Ser 0.84 0.44 - 1.00 mg/dL   Calcium 8.0 (L) 8.9 - 10.3 mg/dL   GFR calc non Af Amer >60 >60 mL/min   GFR calc Af Amer >60 >60 mL/min   Anion gap 6 5 - 15   EKG - sinus bradycardia 50s, normal axis, intervals, no acute ST/T changes Assessment & Plan:  This visit occurred during the SARS-CoV-2 public health emergency.  Safety protocols were in place, including screening questions prior to the visit, additional usage of staff PPE, and extensive cleaning of exam room while observing appropriate contact time as indicated for disinfecting solutions.  Discussed re establishing at our clinic as Dr [name] is no longer at [facility]. She may schedule transfer of care appointment with one of our providers at her convenience.  Problem List Items Addressed This Visit    Shingles    Current rash to R posterior shoulder most consistent with shingles outbreak. She did receive zostavax 2012 likely contributing to attenuated symptoms. Rash present for the past week. Discussed antiviral therapy - unsure how effective to treat at this point, but will try regardless. Rx valtrex 1gm TID x 1 wk. Given discomfort associated with rash, I also prescribed  gabapentin 100mg  BID with option to slowly titrate. Reviewed sedation precautions on gabapentin.       Relevant Medications   valACYclovir (VALTREX) 1000 MG tablet   Essential hypertension  Markedly elevated readings in office today, as well as endorsed at home recently. No etiology besides discomfort from presumed shingles rash. I did recommend better BP control - and will start amlodipine 2.5mg  daily in addition to her losartan and metoprolol. Pulse limits beta blocker titration. Reviewed need to start monitoring BP more closely at home, I did ask her to return in 2 weeks for BP f/u.  She does state Atacand worked best for her but was not covered by insurance - this may be a good option in the future.      Relevant Medications   amLODipine (NORVASC) 2.5 MG tablet   Chest pain - Primary    Anticipate this is shingles rash related, however with elevated blood pressures I did recommend EKG today - overall reassuring.       Relevant Orders   EKG 12-Lead (Completed)       Meds ordered this encounter  Medications  . amLODipine (NORVASC) 2.5 MG tablet    Sig: Take 1 tablet (2.5 mg total) by mouth daily.    Dispense:  30 tablet    Refill:  3  . valACYclovir (VALTREX) 1000 MG tablet    Sig: Take 1 tablet (1,000 mg total) by mouth 3 (three) times daily.    Dispense:  21 tablet    Refill:  0  . gabapentin (NEURONTIN) 100 MG capsule    Sig: Take 1-2 capsules (100-200 mg total) by mouth 2 (two) times daily.    Dispense:  90 capsule    Refill:  0   Orders Placed This Encounter  Procedures  . EKG 12-Lead   Patient Instructions  EKG today Blood pressure staying too high - start amlodipine 2.5mg  daily in addition to your other medications. Schedule follow up visit in 2 weeks for blood pressure follow up.  For likely shingles - start valtrex 1 gm three times daily for a week. May take gabapentin for discomfort - take 100mg  twice daily with option to increase to twice daily after 2-3  days if tolerated well.  If you want to establish with our office, schedule an appointment to establish care.    Follow up plan: Return if symptoms worsen or fail to improve.  Ria Bush, MD

## 2020-02-12 NOTE — Patient Instructions (Addendum)
EKG today Blood pressure staying too high - start amlodipine 2.5mg  daily in addition to your other medications. Schedule follow up visit in 2 weeks for blood pressure follow up.  For likely shingles - start valtrex 1 gm three times daily for a week. May take gabapentin for discomfort - take 100mg  twice daily with option to increase to twice daily after 2-3 days if tolerated well.  If you want to establish with our office, schedule an appointment to establish care.

## 2020-02-13 DIAGNOSIS — R079 Chest pain, unspecified: Secondary | ICD-10-CM | POA: Insufficient documentation

## 2020-02-13 DIAGNOSIS — B029 Zoster without complications: Secondary | ICD-10-CM | POA: Insufficient documentation

## 2020-02-13 NOTE — Assessment & Plan Note (Addendum)
Current rash to R posterior shoulder most consistent with shingles outbreak. She did receive zostavax 2012 likely contributing to attenuated symptoms. Rash present for the past week. Discussed antiviral therapy - unsure how effective to treat at this point, but will try regardless. Rx valtrex 1gm TID x 1 wk. Given discomfort associated with rash, I also prescribed gabapentin 100mg  BID with option to slowly titrate. Reviewed sedation precautions on gabapentin.

## 2020-02-13 NOTE — Assessment & Plan Note (Signed)
Anticipate this is shingles rash related, however with elevated blood pressures I did recommend EKG today - overall reassuring.

## 2020-02-13 NOTE — Assessment & Plan Note (Addendum)
Markedly elevated readings in office today, as well as endorsed at home recently. No etiology besides discomfort from presumed shingles rash. I did recommend better BP control - and will start amlodipine 2.5mg  daily in addition to her losartan and metoprolol. Pulse limits beta blocker titration. Reviewed need to start monitoring BP more closely at home, I did ask her to return in 2 weeks for BP f/u.  She does state Atacand worked best for her but was not covered by insurance - this may be a good option in the future.

## 2020-02-25 ENCOUNTER — Ambulatory Visit (INDEPENDENT_AMBULATORY_CARE_PROVIDER_SITE_OTHER): Payer: Medicare Other | Admitting: Family Medicine

## 2020-02-25 ENCOUNTER — Encounter: Payer: Self-pay | Admitting: Family Medicine

## 2020-02-25 ENCOUNTER — Other Ambulatory Visit: Payer: Self-pay

## 2020-02-25 VITALS — BP 130/64 | HR 65 | Temp 97.8°F | Ht 63.0 in | Wt 184.2 lb

## 2020-02-25 DIAGNOSIS — B029 Zoster without complications: Secondary | ICD-10-CM

## 2020-02-25 DIAGNOSIS — I10 Essential (primary) hypertension: Secondary | ICD-10-CM

## 2020-02-25 DIAGNOSIS — R197 Diarrhea, unspecified: Secondary | ICD-10-CM | POA: Insufficient documentation

## 2020-02-25 NOTE — Progress Notes (Signed)
This visit was conducted in person.  BP 130/64 (BP Location: Left Arm, Patient Position: Sitting, Cuff Size: Normal)   Pulse 65   Temp 97.8 F (36.6 C) (Temporal)   Ht 5\' 3"  (1.6 m)   Wt 184 lb 4 oz (83.6 kg)   SpO2 96%   BMI 32.64 kg/m    CC: 2 wk f/u visit  Subjective:    Patient ID: [name], female    DOB: 12-17-1945, 74 y.o.   MRN: [id number]  HPI: [name] is a 74 y.o. female presenting on 02/25/2020 for Hypertension (Here for 2 wk f/u. )   See prior note for details.  Seen here earlier this month with marked hypertension associated with bitemporal headache in setting of new clustered papular ?vesicular rash with pain present for 1+ wk prior to evaluation. Presumed shingles treated with valtrex course and gabapentin 100mg . HTN started on amlodipine 2.5mg  in addition to her losartan and metoprolol. EKG was reassuring.   Finished valtrex course.  She has been monitoring BP at home - [id number] systolic.  Woke up this morning with nausea/diarrhea x6. Very loose stools with urgency. No vomiting, fevers/chills, blood in stool. No new foods. No sick contacts recently. No recent antibiotics. Took zofran and antidiarrheal she had at home this morning.   Known PAF on eliquis and propafenone (Rhythmol) followed by cardiology. Next appointment is in a few months.  Needs to establish with our office for ongoing care.      Relevant past medical, surgical, family and social history reviewed and updated as indicated. Interim medical history since our last visit reviewed. Allergies and medications reviewed and updated. Outpatient Medications Prior to Visit  Medication Sig Dispense Refill  . acetaminophen (TYLENOL) 500 MG tablet Take 500 mg by mouth every 6 (six) hours as needed for moderate pain or headache.    Marland Kitchen amLODipine (NORVASC) 2.5 MG tablet Take 1 tablet (2.5 mg total) by mouth daily. 30 tablet 3  . celecoxib (CELEBREX) 100 MG capsule TAKE 1 CAPSULE BY MOUTH TWICE A DAY  180 capsule 2  . ELIQUIS 5 MG TABS tablet TAKE 1 TABLET BY MOUTH TWICE A DAY (Patient taking differently: Take 5 mg by mouth 2 (two) times daily. ) 60 tablet 8  . gabapentin (NEURONTIN) 100 MG capsule Take 1-2 capsules (100-200 mg total) by mouth 2 (two) times daily. 90 capsule 0  . losartan (COZAAR) 100 MG tablet Take 1 tablet (100 mg total) by mouth daily. 90 tablet 3  . metoprolol succinate (TOPROL-XL) 50 MG 24 hr tablet TAKE 1 TABLET BY MOUTH EVERY DAY 90 tablet 3  . Polyethyl Glycol-Propyl Glycol (SYSTANE OP) Place 1 drop into both eyes daily as needed (dry eyes).    . propafenone (RYTHMOL) 300 MG tablet TAKE 1 TABLET (300 MG TOTAL) BY MOUTH 2 (TWO) TIMES DAILY. 180 tablet 3  . zolpidem (AMBIEN) 5 MG tablet Take 1 tablet (5 mg total) by mouth at bedtime. 90 tablet 1  . valACYclovir (VALTREX) 1000 MG tablet Take 1 tablet (1,000 mg total) by mouth 3 (three) times daily. 21 tablet 0   No facility-administered medications prior to visit.     Per HPI unless specifically indicated in ROS section below Review of Systems Objective:    BP 130/64 (BP Location: Left Arm, Patient Position: Sitting, Cuff Size: Normal)   Pulse 65   Temp 97.8 F (36.6 C) (Temporal)   Ht 5\' 3"  (1.6 m)   Wt 184 lb 4 oz (  83.6 kg)   SpO2 96%   BMI 32.64 kg/m   Wt Readings from Last 3 Encounters:  02/25/20 184 lb 4 oz (83.6 kg)  02/12/20 187 lb 8 oz (85 kg)  01/20/20 184 lb 3.2 oz (83.6 kg)    Physical Exam Vitals and nursing note reviewed.  Constitutional:      Appearance: Normal appearance. She is obese. She is not ill-appearing.  HENT:     Mouth/Throat:     Mouth: Mucous membranes are moist.     Pharynx: Oropharynx is clear. No oropharyngeal exudate or posterior oropharyngeal erythema.  Eyes:     Extraocular Movements: Extraocular movements intact.     Pupils: Pupils are equal, round, and reactive to light.  Cardiovascular:     Rate and Rhythm: Normal rate and regular rhythm.     Pulses: Normal  pulses.     Heart sounds: Normal heart sounds. No murmur.  Pulmonary:     Effort: Pulmonary effort is normal. No respiratory distress.     Breath sounds: Normal breath sounds. No wheezing, rhonchi or rales.  Musculoskeletal:     Right lower leg: No edema.     Left lower leg: No edema.  Skin:    General: Skin is warm and dry.     Findings: Rash present. No erythema.          Comments: Clearing clustered papular/vesicular rash along R T3/4 distribution  Neurological:     Mental Status: She is alert.  Psychiatric:        Mood and Affect: Mood normal.        Behavior: Behavior normal.       Assessment & Plan:  This visit occurred during the SARS-CoV-2 public health emergency.  Safety protocols were in place, including screening questions prior to the visit, additional usage of staff PPE, and extensive cleaning of exam room while observing appropriate contact time as indicated for disinfecting solutions.   Problem List Items Addressed This Visit    Shingles - Primary    Largely improving rash after valtrex course. Some itching, no more pain - may try off gabapentin at this time (only taking 100mg  nightly).       Essential hypertension    Improved BP with addition of amlodipine 2.5mg  daily - will continue this along with her losartan and toprol XL.       Diarrhea    New diarrhea with nausea over the past 6 hours - anticipate viral. Supportive care reviewed including clear then bland diet over next 24-48 hours. Ok to use zofran PRN nausea. Update if not improving with time.          No orders of the defined types were placed in this encounter.  No orders of the defined types were placed in this encounter.   Patient Instructions  Continue amlodipine for now - blood pressures are looking much better! If BP staying <120/50, may try off the amlodipine.  Continue gabapentin 100mg  nightly for now - if doing well regarding pain from the rash, may try off of it.  For diarrhea - push  small sips of fluids throughout the day, bland diet over next 24-48 hours. This should hopefully get better shortly, let us know if not. Ok to continue zofran.    Follow up plan: No follow-ups on file.  Ria Bush, MD

## 2020-02-25 NOTE — Assessment & Plan Note (Signed)
Improved BP with addition of amlodipine 2.5mg  daily - will continue this along with her losartan and toprol XL.

## 2020-02-25 NOTE — Patient Instructions (Addendum)
Continue amlodipine for now - blood pressures are looking much better! If BP staying <120/50, may try off the amlodipine.  Continue gabapentin 100mg  nightly for now - if doing well regarding pain from the rash, may try off of it.  For diarrhea - push small sips of fluids throughout the day, bland diet over next 24-48 hours. This should hopefully get better shortly, let us know if not. Ok to continue zofran.

## 2020-02-25 NOTE — Assessment & Plan Note (Signed)
Largely improving rash after valtrex course. Some itching, no more pain - may try off gabapentin at this time (only taking 100mg  nightly).

## 2020-02-25 NOTE — Assessment & Plan Note (Signed)
New diarrhea with nausea over the past 6 hours - anticipate viral. Supportive care reviewed including clear then bland diet over next 24-48 hours. Ok to use zofran PRN nausea. Update if not improving with time.

## 2020-02-26 ENCOUNTER — Ambulatory Visit: Payer: Medicare Other | Admitting: Family Medicine

## 2020-03-07 ENCOUNTER — Other Ambulatory Visit: Payer: Self-pay | Admitting: Cardiology

## 2020-04-03 ENCOUNTER — Other Ambulatory Visit: Payer: Self-pay | Admitting: Family Medicine

## 2020-04-03 NOTE — Telephone Encounter (Signed)
Pt not an established with [facility].  Left message on vm per dpr stating if she is interested, to call our office to establish with one of our accepting providers.  Gabapentin Last filled:  02/12/20, #90 Last OV:  02/25/20, 2 wk HTN f/u Next OV:  None

## 2020-04-04 NOTE — Telephone Encounter (Signed)
ERx 

## 2020-05-13 ENCOUNTER — Other Ambulatory Visit: Payer: Self-pay | Admitting: Family Medicine

## 2020-05-14 NOTE — Telephone Encounter (Signed)
Amlodipine refilled but she needs to establish with new PCP. Prior saw Dr Deborra Medina.

## 2020-06-08 ENCOUNTER — Ambulatory Visit (INDEPENDENT_AMBULATORY_CARE_PROVIDER_SITE_OTHER): Payer: Medicare Other

## 2020-06-08 ENCOUNTER — Encounter: Payer: Self-pay | Admitting: Orthopaedic Surgery

## 2020-06-08 ENCOUNTER — Ambulatory Visit (INDEPENDENT_AMBULATORY_CARE_PROVIDER_SITE_OTHER): Payer: Medicare Other | Admitting: Orthopaedic Surgery

## 2020-06-08 DIAGNOSIS — M79671 Pain in right foot: Secondary | ICD-10-CM

## 2020-06-08 DIAGNOSIS — M19071 Primary osteoarthritis, right ankle and foot: Secondary | ICD-10-CM | POA: Diagnosis not present

## 2020-06-08 DIAGNOSIS — Z96651 Presence of right artificial knee joint: Secondary | ICD-10-CM

## 2020-06-08 NOTE — Progress Notes (Signed)
Office Visit Note   Patient: [name]           Date of Birth: 22-Jul-1946           MRN: [id number] Visit Date: 06/08/2020              Requested by: Lucille Passy, MD No address on file PCP: No primary care provider on file.   Assessment & Plan: Visit Diagnoses:  1. Status post total right knee replacement   2. Pain in right foot   3. Arthritis of midtarsal joint of right foot     Plan: For her foot, I recommended supportive shoe wear as well as Voltaren gel.  If this worsens for any way we will send her to a foot and ankle specialist.  From a knee standpoint, we will see her back in about 3 months and a visit would like an AP and lateral standing of both knees.  All question concerns were answered and addressed otherwise.  Follow-Up Instructions: Return in about 3 months (around 09/08/2020).   Orders:  Orders Placed This Encounter  Procedures  . XR Foot Complete Right   No orders of the defined types were placed in this encounter.     Procedures: No procedures performed   Clinical Data: No additional findings.   Subjective: Chief Complaint  Patient presents with  . Right Knee - Follow-up  . Right Foot - Pain, Edema  The patient has a history of both her knees being replaced.  The left and was done more remotely in the right was done in November 2020.  He says the right knee is a little more swollen but the left knee is the one that looks more.  Overall she feels like they are both stable and is making progress.  She has been developing right foot pain and swelling and she points to the midfoot and somewhat medially of the right foot.  She does wear flip-flops and tries to be active as she can.  She is never had surgery on her foot before and is slowly getting worse and is painful to weight-bear and walk.  HPI  Review of Systems She currently denies any headache, chest pain, shortness of breath, fever, chills, nausea, vomiting  Objective: Vital Signs:  There were no vitals taken for this visit.  Physical Exam She is alert and orient x3 and in no acute distress Ortho Exam Examination of both knees shows excellent range of motion of both knees.  Both knees are ligamentously stable.  There is some clunking of the left knee does not present on the right knee but overall the knees look pretty good.  Examination of her right foot does show significant pain at the midfoot especially medially at the MCP joint. Specialty Comments:  No specialty comments available.  Imaging: XR Foot Complete Right  Result Date: 06/08/2020 3 views of the right foot show significant midfoot arthritis especially with the first metatarsocuneiform joint which is significant arthritic with osteophytes and significant joint space narrowing.  There is also sclerotic changes at the joint.    PMFS History: Patient Active Problem List   Diagnosis Date Noted  . Diarrhea 02/25/2020  . Chest pain 02/13/2020  . Shingles 02/13/2020  . Status post total right knee replacement 09/20/2019  . Psychophysiological insomnia 07/22/2019  . Status post total left knee replacement 05/31/2019  . Unilateral primary osteoarthritis, right knee 04/23/2019  . Unilateral primary osteoarthritis, left knee 04/23/2019  . Obesity due  to excess calories with serious comorbidity 07/18/2018  . Chronic insomnia 07/18/2018  . Anxiety associated with depression 07/18/2018  . Morbid obesity due to excess calories ([place]) 04/25/2016  . OSA (obstructive sleep apnea) 04/25/2016  . Paroxysmal atrial fibrillation ([place]) 04/25/2016  . Obstructive sleep apnea 06/04/2015  . Insomnia w/ sleep apnea 06/04/2015  . Hypoxemia 06/04/2015  . Encounter for therapeutic drug monitoring 02/10/2015  . Insomnia, controlled 01/01/2014  . Skin lesion of breast 06/05/2013  . Urinary incontinence, mixed 06/05/2013  . Chronic anticoagulation 09/12/2012  . Atrial fibrillation ([place]) 01/10/2011  . Carotid stenosis 01/10/2011    . DYSPNEA 01/10/2011  . COLONIC POLYPS, ADENOMATOUS, HX OF 11/04/2010  . HEMORRHOIDS-INTERNAL 11/03/2010  . HIATAL HERNIA 11/03/2010  . DIVERTICULAR DISEASE 11/03/2010  . RECTAL BLEEDING 11/03/2010  . FATTY LIVER DISEASE, HX OF 11/03/2010  . HYPERCHOLESTEROLEMIA 08/10/2010  . Other fatigue 12/22/2009  . CHOLELITHIASIS 08/06/2008  . Essential hypertension 07/25/2008  . INSOMNIA, CHRONIC 05/21/2007  . RESTLESS LEG SYNDROME 05/21/2007  . SLEEP APNEA 05/21/2007   Past Medical History:  Diagnosis Date  . Acute upper respiratory infections of unspecified site   . Arthritis   . Carotid bruit   . Carotid disease, bilateral ([place]) 01/31/2012   Mild noted on US carotid  . Cataracts, both eyes   . Chronic insomnia   . Diverticulosis    pt denies  . Dysrhythmia    a fib  . Fatty liver   . GERD (gastroesophageal reflux disease)   . Heart murmur    Has been told by an MD.  . Hiatal hernia    Denies  . History of colon polyps    Noted on Colonscopy  . History of gallstones   . Hyperlipidemia   . Hypertension   . Insomnia, controlled 01/01/2014  . Internal hemorrhoids   . Macular pucker, left eye    surgery planned 01-14-15  . Menopause    noticed in early 64's  . MR (mitral regurgitation) 09/10/2014   Mild, Noted on ECHO   . Other acute sinusitis   . Other malaise and fatigue   . Paroxysmal atrial fibrillation ([facility])    on meds  . Pneumonia    as a small child  . PONV (postoperative nausea and vomiting)   . Pulmonary hypertension ([place]) 09/10/2014   Mild, Noted on ECHO  . Pure hypercholesterolemia   . Restless leg syndrome   . Routine general medical examination at a health care facility   . Screening for lipoid disorders   . Sleep apnea    no c-pap  . Special screening for malignant neoplasms, colon   . Uterine fibroid 09/19/1999   Noted on pelvis ultrasound    Family History  Problem Relation Age of Onset  . Breast cancer Mother   . Heart disease Father   . Colon  cancer Cousin 32  . Diabetes Other        cousins    Past Surgical History:  Procedure Laterality Date  . cardiolyte neg  6/06  . cardiovascular stress MRI  12/20/06   afib  . CHOLECYSTECTOMY    . COLONOSCOPY    . EYE SURGERY     cataract   Toric implant bil eyes  . KNEE ARTHROSCOPY     left  . ORIF- L distal radius  9/07   left wrist  . PARS PLANA VITRECTOMY W/ REPAIR OF MACULAR HOLE     left eye  . torn meniscus  bil knees    . TOTAL KNEE ARTHROPLASTY Left 05/31/2019   Procedure: LEFT TOTAL KNEE ARTHROPLASTY;  Surgeon: Mcarthur Rossetti, MD;  Location: [facility];  Service: Orthopedics;  Laterality: Left;  . TOTAL KNEE ARTHROPLASTY Right 09/20/2019   Procedure: RIGHT TOTAL KNEE ARTHROPLASTY;  Surgeon: Mcarthur Rossetti, MD;  Location: [facility];  Service: Orthopedics;  Laterality: Right;  . TUBAL LIGATION    . UPPER GI ENDOSCOPY     Social History   Occupational History  . Occupation: Product manager: [employer]  Tobacco Use  . Smoking status: Never Smoker  . Smokeless tobacco: Never Used  Vaping Use  . Vaping Use: Never used  Substance and Sexual Activity  . Alcohol use: Not Currently    Comment: rarely  . Drug use: No  . Sexual activity: Never

## 2020-06-09 ENCOUNTER — Telehealth: Payer: Self-pay | Admitting: *Deleted

## 2020-06-09 ENCOUNTER — Other Ambulatory Visit: Payer: Self-pay

## 2020-06-09 ENCOUNTER — Ambulatory Visit (INDEPENDENT_AMBULATORY_CARE_PROVIDER_SITE_OTHER): Payer: Medicare Other | Admitting: Ophthalmology

## 2020-06-09 ENCOUNTER — Encounter (INDEPENDENT_AMBULATORY_CARE_PROVIDER_SITE_OTHER): Payer: Self-pay | Admitting: Ophthalmology

## 2020-06-09 DIAGNOSIS — H35371 Puckering of macula, right eye: Secondary | ICD-10-CM | POA: Diagnosis not present

## 2020-06-09 DIAGNOSIS — Z9889 Other specified postprocedural states: Secondary | ICD-10-CM | POA: Diagnosis not present

## 2020-06-09 NOTE — Care Plan (Signed)
Call to patient to check status of Left TKA at 1 year post-op. Patient states she is doing well with her left knee. Her right knee, which was also replaced in November 2020 is painful by the end of the day. She feels this is coming from the foot, which swells occasionally as well. Overall, she is currently pleased with progress. No further calls regarding left TKA but will follow along for 1 year call for the Right knee in November.

## 2020-06-09 NOTE — Telephone Encounter (Signed)
Ortho bundle 1 year call completed. ?

## 2020-06-09 NOTE — Progress Notes (Signed)
06/09/2020     CHIEF COMPLAINT Patient presents for Retina Follow Up   HISTORY OF PRESENT ILLNESS: [name] is a 74 y.o. female who presents to the clinic today for:   HPI    Retina Follow Up    Patient presents with  Other.  In both eyes.  Duration of 2 years.  Since onset it is stable.          Comments    2 year follow up - OCT OU Patient denies change in vision and overall has no complaints.        Last edited by Gerda Diss on 06/09/2020  9:33 AM. (History)      Referring physician: Hurman Horn, MD [place],  [place] [id number]  HISTORICAL INFORMATION:   Selected notes from the MEDICAL RECORD NUMBER       CURRENT MEDICATIONS: Current Outpatient Medications (Ophthalmic Drugs)  Medication Sig  . Polyethyl Glycol-Propyl Glycol (SYSTANE OP) Place 1 drop into both eyes daily as needed (dry eyes).   No current facility-administered medications for this visit. (Ophthalmic Drugs)   Current Outpatient Medications (Other)  Medication Sig  . acetaminophen (TYLENOL) 500 MG tablet Take 500 mg by mouth every 6 (six) hours as needed for moderate pain or headache.  Marland Kitchen amLODipine (NORVASC) 2.5 MG tablet TAKE 1 TABLET BY MOUTH EVERY DAY  . celecoxib (CELEBREX) 100 MG capsule TAKE 1 CAPSULE BY MOUTH TWICE A DAY  . ELIQUIS 5 MG TABS tablet TAKE 1 TABLET BY MOUTH TWICE A DAY  . gabapentin (NEURONTIN) 100 MG capsule TAKE 1-2 CAPSULES (100-200 MG TOTAL) BY MOUTH 2 (TWO) TIMES DAILY.  Marland Kitchen losartan (COZAAR) 100 MG tablet Take 1 tablet (100 mg total) by mouth daily.  . metoprolol succinate (TOPROL-XL) 50 MG 24 hr tablet TAKE 1 TABLET BY MOUTH EVERY DAY  . propafenone (RYTHMOL) 300 MG tablet TAKE 1 TABLET (300 MG TOTAL) BY MOUTH 2 (TWO) TIMES DAILY.  Marland Kitchen zolpidem (AMBIEN) 5 MG tablet Take 1 tablet (5 mg total) by mouth at bedtime.   No current facility-administered medications for this visit. (Other)      REVIEW OF SYSTEMS:    ALLERGIES Allergies    Allergen Reactions  . Codeine Nausea And Vomiting  . Other Nausea And Vomiting    general anesthesia  . Oxycodone Nausea And Vomiting    PAST MEDICAL HISTORY Past Medical History:  Diagnosis Date  . Acute upper respiratory infections of unspecified site   . Arthritis   . Carotid bruit   . Carotid disease, bilateral ([place]) 01/31/2012   Mild noted on US carotid  . Cataracts, both eyes   . Chronic insomnia   . Diverticulosis    pt denies  . Dysrhythmia    a fib  . Fatty liver   . GERD (gastroesophageal reflux disease)   . Heart murmur    Has been told by an MD.  . Hiatal hernia    Denies  . History of colon polyps    Noted on Colonscopy  . History of gallstones   . Hyperlipidemia   . Hypertension   . Insomnia, controlled 01/01/2014  . Internal hemorrhoids   . Macular pucker, left eye    surgery planned 01-14-15  . Menopause    noticed in early 59's  . MR (mitral regurgitation) 09/10/2014   Mild, Noted on ECHO   . Other acute sinusitis   . Other malaise and fatigue   . Paroxysmal atrial fibrillation (  [place])    on meds  . Pneumonia    as a small child  . PONV (postoperative nausea and vomiting)   . Pulmonary hypertension ([place]) 09/10/2014   Mild, Noted on ECHO  . Pure hypercholesterolemia   . Restless leg syndrome   . Routine general medical examination at a health care facility   . Screening for lipoid disorders   . Sleep apnea    no c-pap  . Special screening for malignant neoplasms, colon   . Uterine fibroid 09/19/1999   Noted on pelvis ultrasound   Past Surgical History:  Procedure Laterality Date  . cardiolyte neg  6/06  . cardiovascular stress MRI  12/20/06   afib  . CHOLECYSTECTOMY    . COLONOSCOPY    . EYE SURGERY     cataract   Toric implant bil eyes  . KNEE ARTHROSCOPY     left  . ORIF- L distal radius  9/07   left wrist  . PARS PLANA VITRECTOMY W/ REPAIR OF MACULAR HOLE     left eye  . torn meniscus     bil knees    . TOTAL KNEE ARTHROPLASTY  Left 05/31/2019   Procedure: LEFT TOTAL KNEE ARTHROPLASTY;  Surgeon: Mcarthur Rossetti, MD;  Location: [facility];  Service: Orthopedics;  Laterality: Left;  . TOTAL KNEE ARTHROPLASTY Right 09/20/2019   Procedure: RIGHT TOTAL KNEE ARTHROPLASTY;  Surgeon: Mcarthur Rossetti, MD;  Location: [facility];  Service: Orthopedics;  Laterality: Right;  . TUBAL LIGATION    . UPPER GI ENDOSCOPY      FAMILY HISTORY Family History  Problem Relation Age of Onset  . Breast cancer Mother   . Heart disease Father   . Colon cancer Cousin 73  . Diabetes Other        cousins    SOCIAL HISTORY Social History   Tobacco Use  . Smoking status: Never Smoker  . Smokeless tobacco: Never Used  Vaping Use  . Vaping Use: Never used  Substance Use Topics  . Alcohol use: Not Currently    Comment: rarely  . Drug use: No         OPHTHALMIC EXAM: Base Eye Exam    Visual Acuity (Snellen - Linear)      Right Left   Dist [place] 20/40-2 20/25-2   Dist ph [place] NI        Tonometry (Tonopen, 9:37 AM)      Right Left   Pressure 8 10       Pupils      Pupils Dark Light Shape React APD   Right PERRL 6 5 Round Slow None   Left PERRL 6 5 Round Slow None       Visual Fields (Counting fingers)      Left Right    Full Full       Extraocular Movement      Right Left    Full Full       Neuro/Psych    Oriented x3: Yes   Mood/Affect: Normal       Dilation    Both eyes: 2.5% Phenylephrine, 1.0% Mydriacyl @ 9:37 AM        Slit Lamp and Fundus Exam    External Exam      Right Left   External Normal Normal       Slit Lamp Exam      Right Left   Lids/Lashes Normal Normal   Conjunctiva/Sclera White and quiet White and quiet  Cornea Clear Clear   Anterior Chamber Deep and quiet Deep and quiet   Iris Round and reactive Round and reactive   Lens Posterior chamber intraocular lens Posterior chamber intraocular lens   Anterior Vitreous Normal Normal       Fundus Exam      Right Left    Posterior Vitreous Posterior vitreous detachment Vitrectomized   Disc Normal Normal   C/D Ratio 0.3 0.3   Macula Epiretinal membrane, with severe topographic distortion Normal   Vessels Normal Normal   Periphery Normal Normal          IMAGING AND PROCEDURES  Imaging and Procedures for 06/09/20  OCT, Retina - OU - Both Eyes       Right Eye Quality was good. Scan locations included subfoveal. Central Foveal Thickness: 446. Progression has worsened. Findings include abnormal foveal contour, epiretinal membrane.   Left Eye Quality was good. Central Foveal Thickness: 396. Progression has improved. Findings include abnormal foveal contour.   Notes Severe epiretinal membrane progression over the last 2 years, will consider the need for vitrectomy and membrane peel in order to stabilize but more importantly improve acuity                ASSESSMENT/PLAN:  No problem-specific Assessment & Plan notes found for this encounter.      ICD-10-CM   1. Macular pucker, right eye  H35.371   2. History of vitrectomy  Z98.890     1.  OD, will offer and recommend to the patient when visual acuity is notably compromised vitrectomy and membrane peel the right eye similar to the position of the left eye some 5 years previous.  Patient is instructed in monocular vision testing at home while using reading glasses to in order to demonstrate the visual impact of this new progression of epiretinal membrane and twisting of the center of the vision in this right eye.  2.  OD, with excellent visual acuity prognosis should surgical correction of this condition be undertaken.  3.  After careful consideration discussion and discussion of risks and benefits, patient is to proceed to schedule membrane peel of right eye in order to correct and improve acuity  Ophthalmic Meds Ordered this visit:  No orders of the defined types were placed in this encounter.      No follow-ups on file.  There are no  Patient Instructions on file for this visit.   Explained the diagnoses, plan, and follow up with the patient and they expressed understanding.  Patient expressed understanding of the importance of proper follow up care.   Clent Demark Advait Buice M.D. Diseases & Surgery of the Retina and Vitreous [facility] [place] 06/09/20     Abbreviations: M myopia (nearsighted); A astigmatism; H hyperopia (farsighted); P presbyopia; Mrx spectacle prescription;  CTL contact lenses; OD right eye; OS left eye; OU both eyes  XT exotropia; ET esotropia; PEK punctate epithelial keratitis; PEE punctate epithelial erosions; DES dry eye syndrome; MGD meibomian gland dysfunction; ATs artificial tears; PFAT's preservative free artificial tears; [place] nuclear sclerotic cataract; PSC posterior subcapsular cataract; ERM epi-retinal membrane; PVD posterior vitreous detachment; RD retinal detachment; DM diabetes mellitus; DR diabetic retinopathy; NPDR non-proliferative diabetic retinopathy; PDR proliferative diabetic retinopathy; CSME clinically significant macular edema; DME diabetic macular edema; dbh dot blot hemorrhages; CWS cotton wool spot; POAG primary open angle glaucoma; C/D cup-to-disc ratio; HVF humphrey visual field; GVF goldmann visual field; OCT optical coherence tomography; IOP intraocular pressure; BRVO Branch retinal vein occlusion;  CRVO central retinal vein occlusion; CRAO central retinal artery occlusion; BRAO branch retinal artery occlusion; RT retinal tear; SB scleral buckle; PPV pars plana vitrectomy; VH Vitreous hemorrhage; PRP panretinal laser photocoagulation; IVK intravitreal kenalog; VMT vitreomacular traction; MH Macular hole;  NVD neovascularization of the disc; NVE neovascularization elsewhere; AREDS age related eye disease study; ARMD age related macular degeneration; POAG primary open angle glaucoma; EBMD epithelial/anterior basement membrane dystrophy; ACIOL anterior chamber intraocular lens; IOL  intraocular lens; PCIOL posterior chamber intraocular lens; Phaco/IOL phacoemulsification with intraocular lens placement; [place] photorefractive keratectomy; LASIK laser assisted in situ keratomileusis; HTN hypertension; DM diabetes mellitus; COPD chronic obstructive pulmonary disease

## 2020-06-25 ENCOUNTER — Telehealth: Payer: Self-pay

## 2020-06-25 NOTE — Telephone Encounter (Signed)
Spoke with patient. Let her know that Dr. Darnell Level, wasn't accepting new patients a the moment. Let her know which Dr, and NP was accepting patient stated she would much rater see Dr Darnell Level and would call back to check the status of Dr. Darnell Level schedule

## 2020-06-25 NOTE — Telephone Encounter (Signed)
Pt left v/m thinks has sinus infection,scratchy throat,runny nose and cough that is more of a dry cough; pt has mucus in throat and mucus does not come up. Pt last saw Dr Deborra Medina 01/17/2017. Pt saw Dr Darnell Level x 2 in 04/21 for acute but pt was to establish with new provider. On 05/14/20 note DR G refilled amlodipine but put she need to establish with new PCP. Prior saw Dr Deborra Medina. There are no available in office or virtual appts today and pt will FU with UC to be evaluated and testing if needed. [place] at front desk will schedule pt to establish care.

## 2020-06-25 NOTE — Telephone Encounter (Signed)
I'm not taking new patients at this time and am also decreasing # days in office to 4/wk - so I think it would be much harder for her to get in to see me. Would recommend she establish with another provider.

## 2020-06-29 DIAGNOSIS — Z20828 Contact with and (suspected) exposure to other viral communicable diseases: Secondary | ICD-10-CM | POA: Diagnosis not present

## 2020-07-01 ENCOUNTER — Ambulatory Visit (INDEPENDENT_AMBULATORY_CARE_PROVIDER_SITE_OTHER): Payer: Medicare Other

## 2020-07-06 NOTE — Progress Notes (Deleted)
History of Present Illness: [name] is seen today to followup of atrial fibrillation and dyspnea. She has a history of paroxysmal atrial fibrillation managed with Rhythmol and metoprolol. She is on Eliquis for anticoagulation.  In October 2015 she complained of increased palpitations and her Rhythmol dose was increased with good response. She complained of increased dyspnea and had a normal stress Myoview. Echo showed normal LV function with mild MR and mild Pulmonary HTN.   In July 2020 she underwent left TKR and in November she had right TKR. She notes her knees are much better now and she is walking normally. She only notes Afib if she misses a  Dose of her Rhythmol. No chest pain or dyspnea. BP has been running high.  On follow up today she notes that her husband passed in December after a 2 year battle with leukemia. She hasn't been monitoring her BP. Notes a couple of episodes of Afib recently. One at night that lasted several hours and another episode that lasted most of the day. She has lost 8 lbs. Still grieving. She does note some dyspnea going up stairs.   Past Medical History:  Diagnosis Date  . Acute upper respiratory infections of unspecified site   . Arthritis   . Carotid bruit   . Carotid disease, bilateral ([place]) 01/31/2012   Mild noted on US carotid  . Cataracts, both eyes   . Chronic insomnia   . Diverticulosis    pt denies  . Dysrhythmia    a fib  . Fatty liver   . GERD (gastroesophageal reflux disease)   . Heart murmur    Has been told by an MD.  . Hiatal hernia    Denies  . History of colon polyps    Noted on Colonscopy  . History of gallstones   . Hyperlipidemia   . Hypertension   . Insomnia, controlled 01/01/2014  . Internal hemorrhoids   . Macular pucker, left eye    surgery planned 01-14-15  . Menopause    noticed in early 22's  . MR (mitral regurgitation) 09/10/2014   Mild, Noted on ECHO   . Other acute sinusitis   . Other malaise and fatigue    . Paroxysmal atrial fibrillation (HCC)    on meds  . Pneumonia    as a small child  . PONV (postoperative nausea and vomiting)   . Pulmonary hypertension ([place]) 09/10/2014   Mild, Noted on ECHO  . Pure hypercholesterolemia   . Restless leg syndrome   . Routine general medical examination at a health care facility   . Screening for lipoid disorders   . Sleep apnea    no c-pap  . Special screening for malignant neoplasms, colon   . Uterine fibroid 09/19/1999   Noted on pelvis ultrasound    Current Outpatient Medications  Medication Sig Dispense Refill  . acetaminophen (TYLENOL) 500 MG tablet Take 500 mg by mouth every 6 (six) hours as needed for moderate pain or headache.    Marland Kitchen amLODipine (NORVASC) 2.5 MG tablet TAKE 1 TABLET BY MOUTH EVERY DAY 30 tablet 3  . celecoxib (CELEBREX) 100 MG capsule TAKE 1 CAPSULE BY MOUTH TWICE A DAY 180 capsule 2  . ELIQUIS 5 MG TABS tablet TAKE 1 TABLET BY MOUTH TWICE A DAY 180 tablet 1  . gabapentin (NEURONTIN) 100 MG capsule TAKE 1-2 CAPSULES (100-200 MG TOTAL) BY MOUTH 2 (TWO) TIMES DAILY. 90 capsule 0  . losartan (COZAAR) 100 MG tablet Take 1  tablet (100 mg total) by mouth daily. 90 tablet 3  . metoprolol succinate (TOPROL-XL) 50 MG 24 hr tablet TAKE 1 TABLET BY MOUTH EVERY DAY 90 tablet 3  . Polyethyl Glycol-Propyl Glycol (SYSTANE OP) Place 1 drop into both eyes daily as needed (dry eyes).    . propafenone (RYTHMOL) 300 MG tablet TAKE 1 TABLET (300 MG TOTAL) BY MOUTH 2 (TWO) TIMES DAILY. 180 tablet 3  . zolpidem (AMBIEN) 5 MG tablet Take 1 tablet (5 mg total) by mouth at bedtime. 90 tablet 1   No current facility-administered medications for this visit.    Allergies: Allergies  Allergen Reactions  . Codeine Nausea And Vomiting  . Other Nausea And Vomiting    general anesthesia  . Oxycodone Nausea And Vomiting   Review of systems: As noted in history of present illness. All other review of systems are reviewed and are negative.  Vital  Signs: There were no vitals taken for this visit.  PHYSICAL EXAM: GENERAL:  Well appearing WF in NAD HEENT:  PERRL, EOMI, sclera are clear. Oropharynx is clear. NECK:  No jugular venous distention, carotid upstroke brisk and symmetric, no bruits, no thyromegaly or adenopathy LUNGS:  Clear to auscultation bilaterally CHEST:  Unremarkable HEART:  RRR,  PMI not displaced or sustained,S1 and S2 within normal limits, no S3, no S4: no clicks, no rubs, no murmurs ABD:  Soft, nontender. BS +, no masses or bruits. No hepatomegaly, no splenomegaly EXT:  2 + pulses throughout, no edema, no cyanosis no clubbing SKIN:  Warm and dry.  No rashes NEURO:  Alert and oriented x 3. Cranial nerves II through XII intact. PSYCH:  Cognitively intact      Laboratory data:  Lab Results  Component Value Date   WBC 11.6 (H) 09/21/2019   HGB 11.4 (L) 09/21/2019   HCT 35.2 (L) 09/21/2019   PLT 191 09/21/2019   GLUCOSE 129 (H) 09/21/2019   CHOL 187 12/25/2018   TRIG 107 12/25/2018   HDL 61 12/25/2018   LDLDIRECT 137.0 08/10/2010   LDLCALC 105 (H) 12/25/2018   ALT 18 12/25/2018   AST 15 12/25/2018   NA 139 09/21/2019   K 4.4 09/21/2019   CL 110 09/21/2019   CREATININE 0.84 09/21/2019   BUN 18 09/21/2019   CO2 23 09/21/2019   TSH 1.190 12/25/2018   INR 1.2 09/20/2019   Ecg today shows sinus brady rate 55. Otherwise normal. I have personally reviewed and interpreted this study.   ASSESSMENT AND PLAN:  1. Paroxysmal atrial fibrillation. This is generally well controlled on  Rythmol. She is on chronic anticoagulation with Eliquis. On Toprol. Ecg looks OK today.  Continue current therapy  2. Hypertension. Blood pressure is high today. Will follow up lab work. I asked that she monitor her BP and let us know if it remains high. If so would add amlodipine or chlorthalidone.   Follow up in 6 months.

## 2020-07-09 ENCOUNTER — Ambulatory Visit: Payer: Medicare Other | Admitting: Cardiology

## 2020-07-09 DIAGNOSIS — Z20822 Contact with and (suspected) exposure to covid-19: Secondary | ICD-10-CM | POA: Diagnosis not present

## 2020-07-15 ENCOUNTER — Ambulatory Visit (INDEPENDENT_AMBULATORY_CARE_PROVIDER_SITE_OTHER): Payer: Medicare Other

## 2020-07-15 ENCOUNTER — Encounter (INDEPENDENT_AMBULATORY_CARE_PROVIDER_SITE_OTHER): Payer: Self-pay

## 2020-07-15 ENCOUNTER — Other Ambulatory Visit: Payer: Self-pay

## 2020-07-15 DIAGNOSIS — H35371 Puckering of macula, right eye: Secondary | ICD-10-CM

## 2020-07-15 MED ORDER — OFLOXACIN 0.3 % OP SOLN: 1.0000 [drp] | Freq: Four times a day (QID) | OPHTHALMIC | 0 refills | Status: AC

## 2020-07-15 MED ORDER — PREDNISOLONE ACETATE 1 % OP SUSP
1.0000 [drp] | Freq: Four times a day (QID) | OPHTHALMIC | 0 refills | Status: DC
Start: 2020-07-15 — End: 2021-01-20

## 2020-07-15 NOTE — Progress Notes (Signed)
07/15/2020     CHIEF COMPLAINT Patient presents for Pre-op Exam   HISTORY OF PRESENT ILLNESS: [name] is a 74 y.o. female who presents to the clinic today for:   HPI    Pre op for PPV/MP OD for ERM 07/29/20   Last edited by Rockie Neighbours, [place] on 07/15/2020  1:25 PM. (History)        HISTORICAL INFORMATION:   Selected notes from the MEDICAL RECORD NUMBER       CURRENT MEDICATIONS: Current Outpatient Medications (Ophthalmic Drugs)  Medication Sig  . ofloxacin (OCUFLOX) 0.3 % ophthalmic solution Place 1 drop into the right eye 4 (four) times daily for 21 days.  Vladimir Faster Glycol-Propyl Glycol (SYSTANE OP) Place 1 drop into both eyes daily as needed (dry eyes).  . prednisoLONE acetate (PRED FORTE) 1 % ophthalmic suspension Place 1 drop into the right eye 4 (four) times daily.   No current facility-administered medications for this visit. (Ophthalmic Drugs)   Current Outpatient Medications (Other)  Medication Sig  . acetaminophen (TYLENOL) 500 MG tablet Take 500 mg by mouth every 6 (six) hours as needed for moderate pain or headache.  Marland Kitchen amLODipine (NORVASC) 2.5 MG tablet TAKE 1 TABLET BY MOUTH EVERY DAY  . celecoxib (CELEBREX) 100 MG capsule TAKE 1 CAPSULE BY MOUTH TWICE A DAY  . ELIQUIS 5 MG TABS tablet TAKE 1 TABLET BY MOUTH TWICE A DAY  . gabapentin (NEURONTIN) 100 MG capsule TAKE 1-2 CAPSULES (100-200 MG TOTAL) BY MOUTH 2 (TWO) TIMES DAILY.  Marland Kitchen losartan (COZAAR) 100 MG tablet Take 1 tablet (100 mg total) by mouth daily.  . metoprolol succinate (TOPROL-XL) 50 MG 24 hr tablet TAKE 1 TABLET BY MOUTH EVERY DAY  . propafenone (RYTHMOL) 300 MG tablet TAKE 1 TABLET (300 MG TOTAL) BY MOUTH 2 (TWO) TIMES DAILY.  Marland Kitchen zolpidem (AMBIEN) 5 MG tablet Take 1 tablet (5 mg total) by mouth at bedtime.   No current facility-administered medications for this visit. (Other)     ALLERGIES Allergies  Allergen Reactions  . Codeine Nausea And Vomiting  . Other Nausea And Vomiting      general anesthesia  . Oxycodone Nausea And Vomiting    PAST MEDICAL HISTORY Past Medical History:  Diagnosis Date  . Acute upper respiratory infections of unspecified site   . Arthritis   . Carotid bruit   . Carotid disease, bilateral ([place]) 01/31/2012   Mild noted on US carotid  . Cataracts, both eyes   . Chronic insomnia   . Diverticulosis    pt denies  . Dysrhythmia    a fib  . Fatty liver   . GERD (gastroesophageal reflux disease)   . Heart murmur    Has been told by an MD.  . Hiatal hernia    Denies  . History of colon polyps    Noted on Colonscopy  . History of gallstones   . Hyperlipidemia   . Hypertension   . Insomnia, controlled 01/01/2014  . Internal hemorrhoids   . Macular pucker, left eye    surgery planned 01-14-15  . Menopause    noticed in early 54's  . MR (mitral regurgitation) 09/10/2014   Mild, Noted on ECHO   . Other acute sinusitis   . Other malaise and fatigue   . Paroxysmal atrial fibrillation (HCC)    on meds  . Pneumonia    as a small child  . PONV (postoperative nausea and vomiting)   . Pulmonary hypertension ([place])  09/10/2014   Mild, Noted on ECHO  . Pure hypercholesterolemia   . Restless leg syndrome   . Routine general medical examination at a health care facility   . Screening for lipoid disorders   . Sleep apnea    no c-pap  . Special screening for malignant neoplasms, colon   . Uterine fibroid 09/19/1999   Noted on pelvis ultrasound   Past Surgical History:  Procedure Laterality Date  . cardiolyte neg  6/06  . cardiovascular stress MRI  12/20/06   afib  . CHOLECYSTECTOMY    . COLONOSCOPY    . EYE SURGERY     cataract   Toric implant bil eyes  . KNEE ARTHROSCOPY     left  . ORIF- L distal radius  9/07   left wrist  . PARS PLANA VITRECTOMY W/ REPAIR OF MACULAR HOLE     left eye  . torn meniscus     bil knees    . TOTAL KNEE ARTHROPLASTY Left 05/31/2019   Procedure: LEFT TOTAL KNEE ARTHROPLASTY;  Surgeon: Mcarthur Rossetti, MD;  Location: [facility];  Service: Orthopedics;  Laterality: Left;  . TOTAL KNEE ARTHROPLASTY Right 09/20/2019   Procedure: RIGHT TOTAL KNEE ARTHROPLASTY;  Surgeon: Mcarthur Rossetti, MD;  Location: [facility];  Service: Orthopedics;  Laterality: Right;  . TUBAL LIGATION    . UPPER GI ENDOSCOPY      FAMILY HISTORY Family History  Problem Relation Age of Onset  . Breast cancer Mother   . Heart disease Father   . Colon cancer Cousin 48  . Diabetes Other        cousins    SOCIAL HISTORY Social History   Tobacco Use  . Smoking status: Never Smoker  . Smokeless tobacco: Never Used  Vaping Use  . Vaping Use: Never used  Substance Use Topics  . Alcohol use: Not Currently    Comment: rarely  . Drug use: No         OPHTHALMIC EXAM:  Base Eye Exam    Visual Acuity (ETDRS)      Right Left   Dist [place] 20/60 +1 20/25 -1   Dist ph [place] 20/30 -1        Tonometry (Tonopen, 1:25 PM)      Right Left   Pressure 10 13       Neuro/Psych    Oriented x3: Yes   Mood/Affect: Normal          IMAGING AND PROCEDURES  Imaging and Procedures for @TODAY @           ASSESSMENT/PLAN:  No diagnosis found.  Ophthalmic Meds Ordered this visit:  Meds ordered this encounter  Medications  . prednisoLONE acetate (PRED FORTE) 1 % ophthalmic suspension    Sig: Place 1 drop into the right eye 4 (four) times daily.    Dispense:  5 mL    Refill:  0  . ofloxacin (OCUFLOX) 0.3 % ophthalmic solution    Sig: Place 1 drop into the right eye 4 (four) times daily for 21 days.    Dispense:  5 mL    Refill:  0        Pre-op completed. Operative consent obtained with pre-op eye drops reviewed with Debbora Lacrosse and sent via [facility] as needed. Post op instructions reviewed with patient and per patient all questions answered.  Rockie Neighbours, COA

## 2020-07-16 ENCOUNTER — Encounter (INDEPENDENT_AMBULATORY_CARE_PROVIDER_SITE_OTHER): Payer: Medicare Other | Admitting: Ophthalmology

## 2020-07-20 NOTE — Progress Notes (Signed)
History of Present Illness: [name] is seen today to followup of atrial fibrillation and dyspnea. She has a history of paroxysmal atrial fibrillation managed with Rhythmol and metoprolol. She is on Eliquis for anticoagulation.  In October 2015 she complained of increased palpitations and her Rhythmol dose was increased with good response. She complained of increased dyspnea and had a normal stress Myoview. Echo showed normal LV function with mild MR and mild Pulmonary HTN.   In July 2020 she underwent left TKR and in November she had right TKR. She notes her knees are much better now and she is walking normally. She only notes Afib if she misses a  Dose of her Rhythmol. No chest pain or dyspnea. BP has been running high.  On follow up today she notes that she had Shingles in March. During that time her BP went up and she did have some AFib. This resolved. She did have Covid in August. She was vaccinated and only noted loss of smell and taste. States breathing is good. No palpitations.   Past Medical History:  Diagnosis Date  . Acute upper respiratory infections of unspecified site   . Arthritis   . Carotid bruit   . Carotid disease, bilateral ([place]) 01/31/2012   Mild noted on US carotid  . Cataracts, both eyes   . Chronic insomnia   . Diverticulosis    pt denies  . Dysrhythmia    a fib  . Fatty liver   . GERD (gastroesophageal reflux disease)   . Heart murmur    Has been told by an MD.  . Hiatal hernia    Denies  . History of colon polyps    Noted on Colonscopy  . History of gallstones   . Hyperlipidemia   . Hypertension   . Insomnia, controlled 01/01/2014  . Internal hemorrhoids   . Macular pucker, left eye    surgery planned 01-14-15  . Menopause    noticed in early 21's  . MR (mitral regurgitation) 09/10/2014   Mild, Noted on ECHO   . Other acute sinusitis   . Other malaise and fatigue   . Paroxysmal atrial fibrillation (HCC)    on meds  . Pneumonia    as a small  child  . PONV (postoperative nausea and vomiting)   . Pulmonary hypertension ([place]) 09/10/2014   Mild, Noted on ECHO  . Pure hypercholesterolemia   . Restless leg syndrome   . Routine general medical examination at a health care facility   . Screening for lipoid disorders   . Sleep apnea    no c-pap  . Special screening for malignant neoplasms, colon   . Uterine fibroid 09/19/1999   Noted on pelvis ultrasound    Current Outpatient Medications  Medication Sig Dispense Refill  . acetaminophen (TYLENOL) 500 MG tablet Take 500 mg by mouth every 6 (six) hours as needed for moderate pain or headache.    Marland Kitchen amLODipine (NORVASC) 2.5 MG tablet TAKE 1 TABLET BY MOUTH EVERY DAY 30 tablet 3  . celecoxib (CELEBREX) 100 MG capsule TAKE 1 CAPSULE BY MOUTH TWICE A DAY 180 capsule 2  . ELIQUIS 5 MG TABS tablet TAKE 1 TABLET BY MOUTH TWICE A DAY 180 tablet 1  . losartan (COZAAR) 100 MG tablet Take 1 tablet (100 mg total) by mouth daily. 90 tablet 3  . metoprolol succinate (TOPROL-XL) 50 MG 24 hr tablet TAKE 1 TABLET BY MOUTH EVERY DAY 90 tablet 3  . ofloxacin (OCUFLOX) 0.3 %  ophthalmic solution Place 1 drop into the right eye 4 (four) times daily for 21 days. 5 mL 0  . Polyethyl Glycol-Propyl Glycol (SYSTANE OP) Place 1 drop into both eyes daily as needed (dry eyes).    . prednisoLONE acetate (PRED FORTE) 1 % ophthalmic suspension Place 1 drop into the right eye 4 (four) times daily. 5 mL 0  . propafenone (RYTHMOL) 300 MG tablet Take 1 tablet (300 mg total) by mouth 2 (two) times daily. 180 tablet 3  . zolpidem (AMBIEN) 5 MG tablet Take 1 tablet (5 mg total) by mouth at bedtime. 90 tablet 1   No current facility-administered medications for this visit.    Allergies: Allergies  Allergen Reactions  . Codeine Nausea And Vomiting  . Other Nausea And Vomiting    general anesthesia  . Oxycodone Nausea And Vomiting   Review of systems: As noted in history of present illness. All other review of systems  are reviewed and are negative.  Vital Signs: BP 130/76   Pulse (!) 55   Temp 97.7 F (36.5 C)   Ht 5\' 3"  (1.6 m)   Wt 190 lb 3.2 oz (86.3 kg)   SpO2 95%   BMI 33.69 kg/m   PHYSICAL EXAM: GENERAL:  Well appearing WF in NAD HEENT:  PERRL, EOMI, sclera are clear. Oropharynx is clear. NECK:  No jugular venous distention, carotid upstroke brisk and symmetric, no bruits, no thyromegaly or adenopathy LUNGS:  Clear to auscultation bilaterally CHEST:  Unremarkable HEART:  RRR,  PMI not displaced or sustained,S1 and S2 within normal limits, no S3, no S4: no clicks, no rubs, no murmurs ABD:  Soft, nontender. BS +, no masses or bruits. No hepatomegaly, no splenomegaly EXT:  2 + pulses throughout, no edema, no cyanosis no clubbing SKIN:  Warm and dry.  No rashes NEURO:  Alert and oriented x 3. Cranial nerves II through XII intact. PSYCH:  Cognitively intact      Laboratory data:  Lab Results  Component Value Date   WBC 11.6 (H) 09/21/2019   HGB 11.4 (L) 09/21/2019   HCT 35.2 (L) 09/21/2019   PLT 191 09/21/2019   GLUCOSE 129 (H) 09/21/2019   CHOL 187 12/25/2018   TRIG 107 12/25/2018   HDL 61 12/25/2018   LDLDIRECT 137.0 08/10/2010   LDLCALC 105 (H) 12/25/2018   ALT 18 12/25/2018   AST 15 12/25/2018   NA 139 09/21/2019   K 4.4 09/21/2019   CL 110 09/21/2019   CREATININE 0.84 09/21/2019   BUN 18 09/21/2019   CO2 23 09/21/2019   TSH 1.190 12/25/2018   INR 1.2 09/20/2019    ASSESSMENT AND PLAN:  1. Paroxysmal atrial fibrillation. This is  well controlled on  Rythmol. She is on chronic anticoagulation with Eliquis.   Continue current therapy. Try and minimize use of NSAIDs.   2. Hypertension. Blood pressure is well controlled today.   Will check CBC and CMET today.  Follow up in 6 months.

## 2020-07-22 ENCOUNTER — Other Ambulatory Visit: Payer: Self-pay

## 2020-07-22 ENCOUNTER — Ambulatory Visit (INDEPENDENT_AMBULATORY_CARE_PROVIDER_SITE_OTHER): Payer: Medicare Other | Admitting: Cardiology

## 2020-07-22 ENCOUNTER — Encounter: Payer: Self-pay | Admitting: Cardiology

## 2020-07-22 VITALS — BP 130/76 | HR 55 | Temp 97.7°F | Ht 63.0 in | Wt 190.2 lb

## 2020-07-22 DIAGNOSIS — I10 Essential (primary) hypertension: Secondary | ICD-10-CM | POA: Diagnosis not present

## 2020-07-22 DIAGNOSIS — I480 Paroxysmal atrial fibrillation: Secondary | ICD-10-CM | POA: Diagnosis not present

## 2020-07-22 LAB — CBC WITH DIFFERENTIAL/PLATELET
Basophils Absolute: 0.1 10*3/uL (ref 0.0–0.2)
Basos: 1 %
EOS (ABSOLUTE): 0.1 10*3/uL (ref 0.0–0.4)
Eos: 2 %
Hematocrit: 38.8 % (ref 34.0–46.6)
Hemoglobin: 13.3 g/dL (ref 11.1–15.9)
Immature Grans (Abs): 0 10*3/uL (ref 0.0–0.1)
Immature Granulocytes: 0 %
Lymphocytes Absolute: 2 10*3/uL (ref 0.7–3.1)
Lymphs: 34 %
MCH: 30.9 pg (ref 26.6–33.0)
MCHC: 34.3 g/dL (ref 31.5–35.7)
MCV: 90 fL (ref 79–97)
Monocytes Absolute: 0.6 10*3/uL (ref 0.1–0.9)
Monocytes: 10 %
Neutrophils Absolute: 3.1 10*3/uL (ref 1.4–7.0)
Neutrophils: 53 %
Platelets: 207 10*3/uL (ref 150–450)
RBC: 4.31 x10E6/uL (ref 3.77–5.28)
RDW: 12.1 % (ref 11.7–15.4)
WBC: 5.9 10*3/uL (ref 3.4–10.8)

## 2020-07-22 LAB — COMPREHENSIVE METABOLIC PANEL
ALT: 13 IU/L (ref 0–32)
AST: 16 IU/L (ref 0–40)
Albumin/Globulin Ratio: 1.4 (ref 1.2–2.2)
Albumin: 3.9 g/dL (ref 3.7–4.7)
Alkaline Phosphatase: 101 IU/L (ref 44–121)
BUN/Creatinine Ratio: 24 (ref 12–28)
BUN: 28 mg/dL — ABNORMAL HIGH (ref 8–27)
Bilirubin Total: 0.5 mg/dL (ref 0.0–1.2)
CO2: 22 mmol/L (ref 20–29)
Calcium: 8.8 mg/dL (ref 8.7–10.3)
Chloride: 105 mmol/L (ref 96–106)
Creatinine, Ser: 1.16 mg/dL — ABNORMAL HIGH (ref 0.57–1.00)
GFR calc Af Amer: 53 mL/min/{1.73_m2} — ABNORMAL LOW (ref >59)
GFR calc non Af Amer: 46 mL/min/{1.73_m2} — ABNORMAL LOW (ref >59)
Globulin, Total: 2.8 g/dL (ref 1.5–4.5)
Glucose: 116 mg/dL — ABNORMAL HIGH (ref 65–99)
Potassium: 5.1 mmol/L (ref 3.5–5.2)
Sodium: 138 mmol/L (ref 134–144)
Total Protein: 6.7 g/dL (ref 6.0–8.5)

## 2020-07-22 MED ORDER — PROPAFENONE HCL 300 MG PO TABS: 300.0000 mg | ORAL_TABLET | Freq: Two times a day (BID) | ORAL | 3 refills | Status: DC

## 2020-07-23 ENCOUNTER — Ambulatory Visit (INDEPENDENT_AMBULATORY_CARE_PROVIDER_SITE_OTHER): Payer: Medicare Other | Admitting: Family Medicine

## 2020-07-23 VITALS — BP 149/76 | HR 59 | Ht 63.0 in | Wt 188.0 lb

## 2020-07-23 DIAGNOSIS — F5104 Psychophysiologic insomnia: Secondary | ICD-10-CM

## 2020-07-23 MED ORDER — ZOLPIDEM TARTRATE 5 MG PO TABS: 5.0000 mg | ORAL_TABLET | Freq: Every day | ORAL | 1 refills | Status: DC

## 2020-07-23 NOTE — Patient Instructions (Signed)
We will continue Ambien 2.5-5mg  at night. Try taking 2.5mg  of Ambien with melatonin 5mg . You may also consider valerian root over the counter as package directs.   Stay well hydrated. Well balanced diet and regular exercise encouraged.   Follow up with me in 6 months   Insomnia Insomnia is a sleep disorder that makes it difficult to fall asleep or stay asleep. Insomnia can cause fatigue, low energy, difficulty concentrating, mood swings, and poor performance at work or school. There are three different ways to classify insomnia:  Difficulty falling asleep.  Difficulty staying asleep.  Waking up too early in the morning. Any type of insomnia can be long-term (chronic) or short-term (acute). Both are common. Short-term insomnia usually lasts for three months or less. Chronic insomnia occurs at least three times a week for longer than three months. What are the causes? Insomnia may be caused by another condition, situation, or substance, such as:  Anxiety.  Certain medicines.  Gastroesophageal reflux disease (GERD) or other gastrointestinal conditions.  Asthma or other breathing conditions.  Restless legs syndrome, sleep apnea, or other sleep disorders.  Chronic pain.  Menopause.  Stroke.  Abuse of alcohol, tobacco, or illegal drugs.  Mental health conditions, such as depression.  Caffeine.  Neurological disorders, such as Alzheimer's disease.  An overactive thyroid (hyperthyroidism). Sometimes, the cause of insomnia may not be known. What increases the risk? Risk factors for insomnia include:  Gender. Women are affected more often than men.  Age. Insomnia is more common as you get older.  Stress.  Lack of exercise.  Irregular work schedule or working night shifts.  Traveling between different time zones.  Certain medical and mental health conditions. What are the signs or symptoms? If you have insomnia, the main symptom is having trouble falling asleep or  having trouble staying asleep. This may lead to other symptoms, such as:  Feeling fatigued or having low energy.  Feeling nervous about going to sleep.  Not feeling rested in the morning.  Having trouble concentrating.  Feeling irritable, anxious, or depressed. How is this diagnosed? This condition may be diagnosed based on:  Your symptoms and medical history. Your health care provider may ask about: ? Your sleep habits. ? Any medical conditions you have. ? Your mental health.  A physical exam. How is this treated? Treatment for insomnia depends on the cause. Treatment may focus on treating an underlying condition that is causing insomnia. Treatment may also include:  Medicines to help you sleep.  Counseling or therapy.  Lifestyle adjustments to help you sleep better. Follow these instructions at home: Eating and drinking   Limit or avoid alcohol, caffeinated beverages, and cigarettes, especially close to bedtime. These can disrupt your sleep.  Do not eat a large meal or eat spicy foods right before bedtime. This can lead to digestive discomfort that can make it hard for you to sleep. Sleep habits   Keep a sleep diary to help you and your health care provider figure out what could be causing your insomnia. Write down: ? When you sleep. ? When you wake up during the night. ? How well you sleep. ? How rested you feel the next day. ? Any side effects of medicines you are taking. ? What you eat and drink.  Make your bedroom a dark, comfortable place where it is easy to fall asleep. ? Put up shades or blackout curtains to block light from outside. ? Use a white noise machine to block noise. ? Keep  the temperature cool.  Limit screen use before bedtime. This includes: ? Watching TV. ? Using your smartphone, tablet, or computer.  Stick to a routine that includes going to bed and waking up at the same times every day and night. This can help you fall asleep faster.  Consider making a quiet activity, such as reading, part of your nighttime routine.  Try to avoid taking naps during the day so that you sleep better at night.  Get out of bed if you are still awake after 15 minutes of trying to sleep. Keep the lights down, but try reading or doing a quiet activity. When you feel sleepy, go back to bed. General instructions  Take over-the-counter and prescription medicines only as told by your health care provider.  Exercise regularly, as told by your health care provider. Avoid exercise starting several hours before bedtime.  Use relaxation techniques to manage stress. Ask your health care provider to suggest some techniques that may work well for you. These may include: ? Breathing exercises. ? Routines to release muscle tension. ? Visualizing peaceful scenes.  Make sure that you drive carefully. Avoid driving if you feel very sleepy.  Keep all follow-up visits as told by your health care provider. This is important. Contact a health care provider if:  You are tired throughout the day.  You have trouble in your daily routine due to sleepiness.  You continue to have sleep problems, or your sleep problems get worse. Get help right away if:  You have serious thoughts about hurting yourself or someone else. If you ever feel like you may hurt yourself or others, or have thoughts about taking your own life, get help right away. You can go to your nearest emergency department or call:  Your local emergency services (911 in the [place]  A suicide crisis helpline, such as the [place] at 970-377-2278. This is open 24 hours a day. Summary  Insomnia is a sleep disorder that makes it difficult to fall asleep or stay asleep.  Insomnia can be long-term (chronic) or short-term (acute).  Treatment for insomnia depends on the cause. Treatment may focus on treating an underlying condition that is causing insomnia.  Keep a sleep  diary to help you and your health care provider figure out what could be causing your insomnia. This information is not intended to replace advice given to you by your health care provider. Make sure you discuss any questions you have with your health care provider. Document Revised: 10/06/2017 Document Reviewed: 08/03/2017 Elsevier Patient Education  2020 [employer].

## 2020-07-23 NOTE — Progress Notes (Signed)
[name]: [name] DOB: [birth year]  REASON FOR VISIT: follow up HISTORY FROM: [name]  Chief Complaint  [name] presents with  . Follow-up    rm 2  . Insomnia    Pt is still not able to sleep     HISTORY OF PRESENT ILLNESS: Today 07/26/20 [name] is a 74 y.o. female here today for follow up for insomnia.  She has continued to wean Ambien.  She is now taking 2.5 mg every night at bedtime.  She has had more difficulty with insomnia since decreasing Ambien dose.  She is motivated to discontinue Ambien as this is no longer covered by her insurance.  She has tried melatonin in the past but is willing to try this medication again.  No obvious adverse effects.  She is feeling well otherwise.  She remains very active at home.  She denies any significant concerns of anxiety or depression. She  HISTORY: (copied from my note on 01/20/2020)  [name] is a 74 y.o. female here today for follow up for insomnia. She continues Ambien 5mg  at bedtime. She feels that Ambien is the only medication that seems to help. She does try to take less when she can. She is recovering from right knee replacement. She had a left knee replacement in July. She is recovering well. She does admit to forgetting her BP meds this morning. BP is usually 140's/80's.    HISTORY: (copied from Dr Dohmeier's note on 07/22/2019)  9-14- 2020, [name] with Ashtyn H. Smarr, a 74 year old hearing impaired Caucasian female [name]. Mrs. Geddis lost her husband and is now living by herself, she has not to her knowledge contracted any COVID or was exposed to. She has been followed here for years for obstructive sleep apnea with sleep hypoxemia and has also chronic insomnia. She is not daytime excessively sleepy endorsing the Epworth score at 1 point today the fatigue severity at 18 points and the geriatric depression score at 4 out of 15 points. Her hearing aids no longer serve her well and therefore we have  switched to a face shield on my part to that she can read my lips.medicare no longer paid for her machine. No longer on CPAP. Pharnacy is now [facility]- she wants Ambien refilled, but understands 5 mg is her age limit. She underwent knee replacement. Post surgery, left knee- &-24-2020.,she used oxycodone and tramadol, but couldn't tolerate these, she lost appetit , was latently nauseated. She wants to use Ambien instead.    07-18-2018, [name] with [name] -a 74 year old female [name] who has become her husbands caretaker.  She endorses today the fatigue severity scale at 29 points, and her Epworth sleepiness score at 2 points. The [name] has suffered for a long time but is chronic insomnia, and underlying condition that has been treated with Ambien at 10 mg. And she continues to have a. Of wakefulness somewhere between 2 and 3 AM, this is also correlated to her Fitbit recording. Her husband continues to receive chemotherapy, she had been successful in losing some weight but has been stable, certainly stress related.    01-15-2018, [name] for this 74 year old caucasian married female with chronic insomnia- and who has learnt to live with it. She reports bedtime is around 11.30 pm, sleeping for about 7 hours total.  Her husband is on chemotherapy and tolerating it well, he takes naps in daytime, she doesn't. She has lost weight again, and has not gained  back over the last 12 month. She reports her cardiologist is happy with her, too. Her endorsing the Epworth score at only 2 points, FSS 23,   Today 12/27/16: Mrs. Oboyle is a 74 year old female with a history of chronic insomnia. She returns today for follow-up. At her last visit she was advised that she did not have to use the CPAP as it was not offering her much benefit. She states that she's been using Ambien 10 mg at bedtime. She states that this is the only medication that has offered her some benefit. She states that she still wakes up numerous  times a night but she is able to go back to sleep. She reports on the rare occasion she will have a night that she cannot sleep at all. She was referred to Dr. Casimiro Needle but states that she never made an appointment. She states that her husband has had a lot of health issues and may being consumed with that for the last several months. She states that she understands that she has a lot of anxiety but she does not feel depressed. She returns today for an evaluation. And feel refreshed  We will follow HISTORY 01-20-2012 per Dr. Brett Fairy' notes: Emily Headings Wrightis a 1femaleand seen here in referral from Dr. Francella Solian chronic insomnia. The [name] will be evaluated for organic reasons of insomnia.   The [name] was last seen on 01-20-12 and has lost a lot of weight in the meantime. After trazodone and Remeron did fail to produce satisfying results for now she will sometimes take a Benadryl or Tylenol PM to assist her with her sleep initiation. She thinks nocturia was never the main problem. The [name] states that she has lost a lot of weight intentionally and that this has helped certainly some of her other health issues including hypertension. She was diagnosed with atrial fibrillation in the past has been on chronic anticoagulation. She reported that topiramate caused her to be drowsy and daytime when taken in the morning for that reason we will change the intake time 2 PM this may assist with her sleepiness as well as was her headaches. It has also helped her to lose weight.  At bedtime routines were reviewed : the [name] was to bed around 11 PM rises at 6:30 AM depending on how much sleep she got.  She did have amnestic spells from after some nights when using Ambien- but she sleeps all much better on Ambien but on the other medications.  The onset of her insomnia was related to the development of menopause but she only has really hot flashes now- she uses Ambien chronically for 2 decades.  Her  bedroom is described as quiet, cool and dark she shares a bedroom with her husband, who is very sick.    Interval history , 01-13-15 CD, [name] is seen here following up on her sleep study from 01-29-14 the study showed a [name] with normal heart rate some prolonged oxygen desaturation and very mild apnea. A problem for the [name] was that she couldn't really sleep through the night she was 53 minutes awake during her sleep study. 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 Dr. Zenia Resides had originally been referred to the [name] for insomnia evaluation. Based on the hypoxemia and the mix of a mild sleep apnea , she could be invited the [name] to come in for a CPAP titration split-night study. The [name] is beginning cataracts and she has just been seen by optometry, she has  been insomnic again. Testing was suspected to reveal also retinal disease and she was referred to Dr. Zadie Rhine a retinal specialist. She has a macular pucker. Surgery is scheduled for tomorrow morning 01-14-15. She is in need of a refill for Ambien which he uses to treat her chronic insomnia. In spite of this the last week had been difficult for her I think because of anxiety in anticipation of the surgery. Her fatigue severity score is up to 40 points and her fatigue her Epworth sleepiness score is normal at 2 points.  Her sleep is not restorative, still. She will have an overnight pulse-oximetry after her surgery is done. I will schedule this though [facility]. [name] with NP or me in May-June 2016.  06-04-15, [name] is here status post cataract ectomy bilaterally and surgery to the retina. As I had quoted in my last visit she underwent a sleep study on 3-20 5-15. She continued to complain of nonrestorative not refreshing sleep. She feels however that she has started to manage better with that and her fatigue is no longer as excessive. She endorsed the fatigue severity score of 31 points and the Epworth sleepiness  score at 3 points. She's not likely to fall asleep unintended. She doesn't struggle with sleep attacks. In her geriatric depression score she endorsed only 1 point which is not indicative of suffering from clinical depression. She wears a fit bit, which records her pulse, she wore a ONO, and had 2 hours of low oxygen levels, she will need a referral to pulmonology for further evaluation or will proceed with a CPAP auto-titration, to see if correcting the mild apnea is an option. She needs to lose weight.   04-25-2016 [name] was last seen by mypractitioner Cecille Rubin, and referred for a new CPAP titration on 01/27/2016. She was again diagnosed with insomnia and a low sleep efficiency of only 72.5% CPAP was explored from 5 through 8 cm water. And an air-fit P 10 nasal pillow in extra small size was used. She still reports that she feels restless and uncomfortable just was having anything in her face or attached to her body. And of course the PLM arousals were not addressed by CPAP. She would like to sleep on her side but feels compelled to sleep in supine position so that the mass does not get as long.  The compliance data since May 30 show 14 out of 15 days of use she used to machine over 4 hours for only 11 out of 15 days. A 74% compliance. The average user time is 4 hours and 56 minutes. The set pressure was 8 cm water with 3 cm EPR and her residual AHI is 2.9 there is a significant reduction in her apnea index. However her baseline AHI was not that high in 2015 with an AHI of 9. I think given that the [name] feels no improvement in her daytime alertness and actually more bothered by the machine and given that she has a mild apnea to begin with I would allow her to discontinue the use of CPAP she could change to a dental device is snoring bothers her, but she denies. She will concentrate on weight loss.   REVIEW OF SYSTEMS: Out of a complete 14 system review of symptoms, the [name] complains  only of the following symptoms, insomnia and all other reviewed systems are negative.   ALLERGIES: Allergies  Allergen Reactions  . Codeine Nausea And Vomiting  . Other Nausea And Vomiting  general anesthesia  . Oxycodone Nausea And Vomiting    HOME MEDICATIONS: Outpatient Medications Prior to Visit  Medication Sig Dispense Refill  . acetaminophen (TYLENOL) 500 MG tablet Take 500 mg by mouth every 6 (six) hours as needed for moderate pain or headache.    Marland Kitchen amLODipine (NORVASC) 2.5 MG tablet TAKE 1 TABLET BY MOUTH EVERY DAY 30 tablet 3  . celecoxib (CELEBREX) 100 MG capsule TAKE 1 CAPSULE BY MOUTH TWICE A DAY 180 capsule 2  . ELIQUIS 5 MG TABS tablet TAKE 1 TABLET BY MOUTH TWICE A DAY 180 tablet 1  . losartan (COZAAR) 100 MG tablet Take 1 tablet (100 mg total) by mouth daily. 90 tablet 3  . metoprolol succinate (TOPROL-XL) 50 MG 24 hr tablet TAKE 1 TABLET BY MOUTH EVERY DAY 90 tablet 3  . ofloxacin (OCUFLOX) 0.3 % ophthalmic solution Place 1 drop into the right eye 4 (four) times daily for 21 days. 5 mL 0  . Polyethyl Glycol-Propyl Glycol (SYSTANE OP) Place 1 drop into both eyes daily as needed (dry eyes).    . prednisoLONE acetate (PRED FORTE) 1 % ophthalmic suspension Place 1 drop into the right eye 4 (four) times daily. 5 mL 0  . propafenone (RYTHMOL) 300 MG tablet Take 1 tablet (300 mg total) by mouth 2 (two) times daily. 180 tablet 3  . zolpidem (AMBIEN) 5 MG tablet Take 1 tablet (5 mg total) by mouth at bedtime. 90 tablet 1   No facility-administered medications prior to visit.    PAST MEDICAL HISTORY: Past Medical History:  Diagnosis Date  . Acute upper respiratory infections of unspecified site   . Arthritis   . Carotid bruit   . Carotid disease, bilateral ([place]) 01/31/2012   Mild noted on US carotid  . Cataracts, both eyes   . Chronic insomnia   . Diverticulosis    pt denies  . Dysrhythmia    a fib  . Fatty liver   . GERD (gastroesophageal reflux disease)   .  Heart murmur    Has been told by an MD.  . Hiatal hernia    Denies  . History of colon polyps    Noted on Colonscopy  . History of gallstones   . Hyperlipidemia   . Hypertension   . Insomnia, controlled 01/01/2014  . Internal hemorrhoids   . Macular pucker, left eye    surgery planned 01-14-15  . Menopause    noticed in early 46's  . MR (mitral regurgitation) 09/10/2014   Mild, Noted on ECHO   . Other acute sinusitis   . Other malaise and fatigue   . Paroxysmal atrial fibrillation ([facility])    on meds  . Pneumonia    as a small child  . PONV (postoperative nausea and vomiting)   . Pulmonary hypertension ([place]) 09/10/2014   Mild, Noted on ECHO  . Pure hypercholesterolemia   . Restless leg syndrome   . Routine general medical examination at a health care facility   . Screening for lipoid disorders   . Sleep apnea    no c-pap  . Special screening for malignant neoplasms, colon   . Uterine fibroid 09/19/1999   Noted on pelvis ultrasound    PAST SURGICAL HISTORY: Past Surgical History:  Procedure Laterality Date  . cardiolyte neg  6/06  . cardiovascular stress MRI  12/20/06   afib  . CHOLECYSTECTOMY    . COLONOSCOPY    . EYE SURGERY     cataract   Toric  implant bil eyes  . KNEE ARTHROSCOPY     left  . ORIF- L distal radius  9/07   left wrist  . PARS PLANA VITRECTOMY W/ REPAIR OF MACULAR HOLE     left eye  . torn meniscus     bil knees    . TOTAL KNEE ARTHROPLASTY Left 05/31/2019   Procedure: LEFT TOTAL KNEE ARTHROPLASTY;  Surgeon: Mcarthur Rossetti, MD;  Location: [facility];  Service: Orthopedics;  Laterality: Left;  . TOTAL KNEE ARTHROPLASTY Right 09/20/2019   Procedure: RIGHT TOTAL KNEE ARTHROPLASTY;  Surgeon: Mcarthur Rossetti, MD;  Location: [facility];  Service: Orthopedics;  Laterality: Right;  . TUBAL LIGATION    . UPPER GI ENDOSCOPY      FAMILY HISTORY: Family History  Problem Relation Age of Onset  . Breast cancer Mother   . Heart disease Father   .  Colon cancer Cousin 20  . Diabetes Other        cousins    SOCIAL HISTORY: Social History   Socioeconomic History  . Marital status: Widowed    Spouse name: Sterling Big  . Number of children: 3  . Years of education: College  . Highest education level: Not on file  Occupational History  . Occupation: Product manager: [employer]  Tobacco Use  . Smoking status: Never Smoker  . Smokeless tobacco: Never Used  Vaping Use  . Vaping Use: Never used  Substance and Sexual Activity  . Alcohol use: Not Currently    Comment: rarely  . Drug use: No  . Sexual activity: Never  Other Topics Concern  . Not on file  Social History Narrative   [name] is widowed. [name] has three adult children.   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    [name] is right-handed.   [name] drinks two cups of caffeine daily.   [name] has a college education.   Social Determinants of Health   Financial Resource Strain:   . Difficulty of Paying Living Expenses: Not on file  Food Insecurity:   . Worried About Charity fundraiser in the Last Year: Not on file  . Ran Out of Food in the Last Year: Not on file  Transportation Needs:   . Lack of Transportation (Medical): Not on file  . Lack of Transportation (Non-Medical): Not on file  Physical Activity:   . Days of Exercise per Week: Not on file  . Minutes of Exercise per Session: Not on file  Stress:   . Feeling of Stress : Not on file  Social Connections:   . Frequency of Communication with Friends and Family: Not on file  . Frequency of Social Gatherings with Friends and Family: Not on file  . Attends Religious Services: Not on file  . Active Member of Clubs or Organizations: Not on file  . Attends Archivist Meetings: Not on file  . Marital Status: Not on file  Intimate Partner Violence:   . Fear of Current or Ex-Partner: Not on file  . Emotionally Abused: Not on file  . Physically  Abused: Not on file  . Sexually Abused: Not on file      PHYSICAL EXAM  Vitals:   07/23/20 0938  BP: (!) 149/76  Pulse: (!) 59  Weight: 188 lb (85.3 kg)  Height: 5\' 3"  (1.6 m)   Body mass index is 33.3 kg/m.  Generalized: Well developed, in no acute distress  Cardiology: normal rate and rhythm, no murmur noted Respiratory: clear to auscultation bilaterally  Neurological examination  Mentation: Alert oriented to time, place, history taking. Follows all commands speech and language fluent Cranial nerve II-XII: Pupils were equal round reactive to light. Extraocular movements were full, visual field were full  Motor: The motor testing reveals 5 over 5 strength of all 4 extremities. Good symmetric motor tone is noted throughout.  Gait and station: Gait is normal.    DIAGNOSTIC DATA (LABS, IMAGING, TESTING) - I reviewed [name] records, labs, notes, testing and imaging myself where available.  MMSE - Mini Mental State Exam 01/20/2016  Orientation to time 5  Orientation to Place 5  Registration 3  Attention/ Calculation 5  Recall 3  Language- name 2 objects 0  Language- repeat 1  Language- follow 3 step command 3  Language- read & follow direction 1  Write a sentence 0  Copy design 0  Total score 26     Lab Results  Component Value Date   WBC 5.9 07/22/2020   HGB 13.3 07/22/2020   HCT 38.8 07/22/2020   MCV 90 07/22/2020   PLT 207 07/22/2020      Component Value Date/Time   NA 138 07/22/2020 1028   K 5.1 07/22/2020 1028   CL 105 07/22/2020 1028   CO2 22 07/22/2020 1028   GLUCOSE 116 (H) 07/22/2020 1028   GLUCOSE 129 (H) 09/21/2019 0301   BUN 28 (H) 07/22/2020 1028   CREATININE 1.16 (H) 07/22/2020 1028   CREATININE 1.09 (H) 10/18/2016 1003   CALCIUM 8.8 07/22/2020 1028   PROT 6.7 07/22/2020 1028   ALBUMIN 3.9 07/22/2020 1028   AST 16 07/22/2020 1028   ALT 13 07/22/2020 1028   ALKPHOS 101 07/22/2020 1028   BILITOT 0.5 07/22/2020 1028   GFRNONAA 46 (L)  07/22/2020 1028   GFRAA 53 (L) 07/22/2020 1028   Lab Results  Component Value Date   CHOL 187 12/25/2018   HDL 61 12/25/2018   LDLCALC 105 (H) 12/25/2018   LDLDIRECT 137.0 08/10/2010   TRIG 107 12/25/2018   CHOLHDL 3.1 12/25/2018   No results found for: HGBA1C No results found for: VITAMINB12 Lab Results  Component Value Date   TSH 1.190 12/25/2018       ASSESSMENT AND PLAN 74 y.o. year old female  has a past medical history of Acute upper respiratory infections of unspecified site, Arthritis, Carotid bruit, Carotid disease, bilateral ([place]) (01/31/2012), Cataracts, both eyes, Chronic insomnia, Diverticulosis, Dysrhythmia, Fatty liver, GERD (gastroesophageal reflux disease), Heart murmur, Hiatal hernia, History of colon polyps, History of gallstones, Hyperlipidemia, Hypertension, Insomnia, controlled (01/01/2014), Internal hemorrhoids, Macular pucker, left eye, Menopause, MR (mitral regurgitation) (09/10/2014), Other acute sinusitis, Other malaise and fatigue, Paroxysmal atrial fibrillation ([place]), Pneumonia, PONV (postoperative nausea and vomiting), Pulmonary hypertension ([place]) (09/10/2014), Pure hypercholesterolemia, Restless leg syndrome, Routine general medical examination at a health care facility, Screening for lipoid disorders, Sleep apnea, Special screening for malignant neoplasms, colon, and Uterine fibroid (09/19/1999). here with significant    ICD-10-CM   1. Chronic insomnia  F51.04      Perlita continues to have difficulty with insomnia.  She is very motivated to wean Ambien.  She is now taking 2.5 mg daily at bedtime.  We have discussed the option to add melatonin up to 5 mg nightly with Ambien dosing.  She will continue to wean Ambien as tolerated.  She may also consider valerian root over-the-counter if needed.  Healthy lifestyle duration, well-balanced diet and regular  exercise encouraged.  She will continue to follow-up closely with her primary care provider.  She will  follow-up with me in 6 months, sooner if needed.  She verbalizes understanding and agreement with this plan.  No orders of the defined types were placed in this encounter.    Meds ordered this encounter  Medications  . zolpidem (AMBIEN) 5 MG tablet    Sig: Take 1 tablet (5 mg total) by mouth at bedtime.    Dispense:  90 tablet    Refill:  1    Order Specific Question:   Supervising Provider    Answer:   Melvenia Beam [id number]      I spent 15 minutes with the [name]. 50% of this time was spent counseling and educating [name] on plan of care and medications.    Debbora Presto, FNP-C 07/26/2020, 1:27 PM [facility] [address] [place], [place] [address] [phone]

## 2020-07-26 ENCOUNTER — Encounter: Payer: Self-pay | Admitting: Family Medicine

## 2020-07-28 ENCOUNTER — Other Ambulatory Visit: Payer: Self-pay

## 2020-07-29 ENCOUNTER — Encounter (INDEPENDENT_AMBULATORY_CARE_PROVIDER_SITE_OTHER): Payer: Medicare Other | Admitting: Ophthalmology

## 2020-07-29 DIAGNOSIS — H35371 Puckering of macula, right eye: Secondary | ICD-10-CM

## 2020-07-29 HISTORY — PX: EYE SURGERY: SHX253

## 2020-07-30 ENCOUNTER — Encounter (INDEPENDENT_AMBULATORY_CARE_PROVIDER_SITE_OTHER): Payer: Self-pay | Admitting: Ophthalmology

## 2020-07-30 ENCOUNTER — Other Ambulatory Visit: Payer: Self-pay

## 2020-07-30 ENCOUNTER — Ambulatory Visit (INDEPENDENT_AMBULATORY_CARE_PROVIDER_SITE_OTHER): Payer: Medicare Other | Admitting: Ophthalmology

## 2020-07-30 DIAGNOSIS — Z09 Encounter for follow-up examination after completed treatment for conditions other than malignant neoplasm: Secondary | ICD-10-CM

## 2020-07-30 NOTE — Patient Instructions (Signed)
Patient instructed not to mash or compress the eye  No lifting and bending for 1 week. No water in the eye for 10 days. Do not rub the eye. Wear shield at night for 1-3 days.  Wear your CPAP as normal, if instructed by your doctor.  Continue your topical medications for a total of 3 weeks.  Do not refill your postoperative medications unless instructed.

## 2020-07-30 NOTE — Assessment & Plan Note (Signed)
Postop day #1 status post vitrectomy membrane peel for epiretinal membrane.  Looks great.  Patient to commence with topical eyedrops.  I discussed with patient that the diagnosis of sleep apnea and that remains untreated does slow the healing process for this macular condition in my experience

## 2020-07-30 NOTE — Progress Notes (Signed)
07/30/2020     CHIEF COMPLAINT Patient presents for Post-op Follow-up   HISTORY OF PRESENT ILLNESS: [name] is a 74 y.o. female who presents to the clinic today for:   HPI    Post-op Follow-up    In right eye.  Discomfort includes Negative for pain.  Vision is stable.  I, the attending physician,  performed the HPI with the patient and updated documentation appropriately.          Comments    1 Day s\p Vitrectomy and membrane peel OD  Pt denies pain/discomfort.         Last edited by Tilda Franco on 07/30/2020  8:09 AM. (History)      Referring physician: No referring provider defined for this encounter.  HISTORICAL INFORMATION:   Selected notes from the MEDICAL RECORD NUMBER       CURRENT MEDICATIONS: Current Outpatient Medications (Ophthalmic Drugs)  Medication Sig  . ofloxacin (OCUFLOX) 0.3 % ophthalmic solution Place 1 drop into the right eye 4 (four) times daily for 21 days.  Vladimir Faster Glycol-Propyl Glycol (SYSTANE OP) Place 1 drop into both eyes daily as needed (dry eyes).  . prednisoLONE acetate (PRED FORTE) 1 % ophthalmic suspension Place 1 drop into the right eye 4 (four) times daily.   No current facility-administered medications for this visit. (Ophthalmic Drugs)   Current Outpatient Medications (Other)  Medication Sig  . acetaminophen (TYLENOL) 500 MG tablet Take 500 mg by mouth every 6 (six) hours as needed for moderate pain or headache.  Marland Kitchen amLODipine (NORVASC) 2.5 MG tablet TAKE 1 TABLET BY MOUTH EVERY DAY  . ELIQUIS 5 MG TABS tablet TAKE 1 TABLET BY MOUTH TWICE A DAY  . losartan (COZAAR) 100 MG tablet Take 1 tablet (100 mg total) by mouth daily.  . metoprolol succinate (TOPROL-XL) 50 MG 24 hr tablet TAKE 1 TABLET BY MOUTH EVERY DAY  . propafenone (RYTHMOL) 300 MG tablet Take 1 tablet (300 mg total) by mouth 2 (two) times daily.  Marland Kitchen zolpidem (AMBIEN) 5 MG tablet Take 1 tablet (5 mg total) by mouth at bedtime.   No current  facility-administered medications for this visit. (Other)      REVIEW OF SYSTEMS:    ALLERGIES Allergies  Allergen Reactions  . Codeine Nausea And Vomiting  . Other Nausea And Vomiting    general anesthesia  . Oxycodone Nausea And Vomiting    PAST MEDICAL HISTORY Past Medical History:  Diagnosis Date  . Acute upper respiratory infections of unspecified site   . Arthritis   . Carotid bruit   . Carotid disease, bilateral ([place]) 01/31/2012   Mild noted on US carotid  . Cataracts, both eyes   . Chronic insomnia   . Diverticulosis    pt denies  . Dysrhythmia    a fib  . Fatty liver   . GERD (gastroesophageal reflux disease)   . Heart murmur    Has been told by an MD.  . Hiatal hernia    Denies  . History of colon polyps    Noted on Colonscopy  . History of gallstones   . Hyperlipidemia   . Hypertension   . Insomnia, controlled 01/01/2014  . Internal hemorrhoids   . Macular pucker, left eye    surgery planned 01-14-15  . Menopause    noticed in early 63's  . MR (mitral regurgitation) 09/10/2014   Mild, Noted on ECHO   . Other acute sinusitis   . Other malaise  and fatigue   . Paroxysmal atrial fibrillation (H[facility])    on meds  . Pneumonia    as a small child  . PONV (postoperative nausea and vomiting)   . Pulmonary hypertension ([place]) 09/10/2014   Mild, Noted on ECHO  . Pure hypercholesterolemia   . Restless leg syndrome   . Routine general medical examination at a health care facility   . Screening for lipoid disorders   . Sleep apnea    no c-pap  . Special screening for malignant neoplasms, colon   . Uterine fibroid 09/19/1999   Noted on pelvis ultrasound   Past Surgical History:  Procedure Laterality Date  . cardiolyte neg  6/06  . cardiovascular stress MRI  12/20/06   afib  . CHOLECYSTECTOMY    . COLONOSCOPY    . EYE SURGERY     cataract   Toric implant bil eyes  . EYE SURGERY Right 07/29/2020   Mem Peel and Vit - Dr. Zadie Rhine  . KNEE ARTHROSCOPY       left  . ORIF- L distal radius  9/07   left wrist  . PARS PLANA VITRECTOMY W/ REPAIR OF MACULAR HOLE     left eye  . torn meniscus     bil knees    . TOTAL KNEE ARTHROPLASTY Left 05/31/2019   Procedure: LEFT TOTAL KNEE ARTHROPLASTY;  Surgeon: Mcarthur Rossetti, MD;  Location: [facility];  Service: Orthopedics;  Laterality: Left;  . TOTAL KNEE ARTHROPLASTY Right 09/20/2019   Procedure: RIGHT TOTAL KNEE ARTHROPLASTY;  Surgeon: Mcarthur Rossetti, MD;  Location: [facility];  Service: Orthopedics;  Laterality: Right;  . TUBAL LIGATION    . UPPER GI ENDOSCOPY      FAMILY HISTORY Family History  Problem Relation Age of Onset  . Breast cancer Mother   . Heart disease Father   . Colon cancer Cousin 34  . Diabetes Other        cousins    SOCIAL HISTORY Social History   Tobacco Use  . Smoking status: Never Smoker  . Smokeless tobacco: Never Used  Vaping Use  . Vaping Use: Never used  Substance Use Topics  . Alcohol use: Not Currently    Comment: rarely  . Drug use: No         OPHTHALMIC EXAM: Base Eye Exam    Visual Acuity (Snellen - Linear)      Right Left   Dist [place] 20/50 + 20/30 -1   Dist ph [place] 20/40 -2        Tonometry (Tonopen, 8:15 AM)      Right Left   Pressure 8 12       Pupils      Dark Light Shape React   Right 8 8 Round Dilated   Left           Neuro/Psych    Oriented x3: Yes   Mood/Affect: Normal        Slit Lamp and Fundus Exam    External Exam      Right Left   External Normal Normal       Slit Lamp Exam      Right Left   Lids/Lashes Normal Normal   Conjunctiva/Sclera White and quiet White and quiet   Cornea Clear Clear   Anterior Chamber Deep and quiet Deep and quiet   Iris Round and reactive Round and reactive   Lens Posterior chamber intraocular lens Posterior chamber intraocular lens   Anterior Vitreous Normal Normal  Fundus Exam      Right Left   Posterior Vitreous Clear, vitrectomized    Disc Normal    C/D Ratio  0.3    Macula Much less topographic distortion centrally    Vessels Normal    Periphery Normal           IMAGING AND PROCEDURES  Imaging and Procedures for 07/30/20           ASSESSMENT/PLAN:  Postoperative follow-up Postop day #1 status post vitrectomy membrane peel for epiretinal membrane.  Looks great.  Patient to commence with topical eyedrops.  I discussed with patient that the diagnosis of sleep apnea and that remains untreated does slow the healing process for this macular condition in my experience      ICD-10-CM   1. Postoperative follow-up  Z09     1.  Patient to commence topical eyedrop therapy today and to use for a total of 3 weeks.  2.  RV 1 week for OCT OD, OS  3.  Ophthalmic Meds Ordered this visit:  No orders of the defined types were placed in this encounter.      Return in about 1 week (around 08/06/2020) for dilate, OD, POST OP, OCT.  Patient Instructions  Patient instructed not to mash or compress the eye  No lifting and bending for 1 week. No water in the eye for 10 days. Do not rub the eye. Wear shield at night for 1-3 days.  Wear your CPAP as normal, if instructed by your doctor.  Continue your topical medications for a total of 3 weeks.  Do not refill your postoperative medications unless instructed.    Explained the diagnoses, plan, and follow up with the patient and they expressed understanding.  Patient expressed understanding of the importance of proper follow up care.   Clent Demark Jachob Mcclean M.D. Diseases & Surgery of the Retina and Vitreous Retina & Diabetic [place] 07/30/20     Abbreviations: M myopia (nearsighted); A astigmatism; H hyperopia (farsighted); P presbyopia; Mrx spectacle prescription;  CTL contact lenses; OD right eye; OS left eye; OU both eyes  XT exotropia; ET esotropia; PEK punctate epithelial keratitis; PEE punctate epithelial erosions; DES dry eye syndrome; MGD meibomian gland dysfunction; ATs artificial tears;  PFAT's preservative free artificial tears; [place] nuclear sclerotic cataract; PSC posterior subcapsular cataract; ERM epi-retinal membrane; PVD posterior vitreous detachment; RD retinal detachment; DM diabetes mellitus; DR diabetic retinopathy; NPDR non-proliferative diabetic retinopathy; PDR proliferative diabetic retinopathy; CSME clinically significant macular edema; DME diabetic macular edema; dbh dot blot hemorrhages; CWS cotton wool spot; POAG primary open angle glaucoma; C/D cup-to-disc ratio; HVF humphrey visual field; GVF goldmann visual field; OCT optical coherence tomography; IOP intraocular pressure; BRVO Branch retinal vein occlusion; CRVO central retinal vein occlusion; CRAO central retinal artery occlusion; BRAO branch retinal artery occlusion; RT retinal tear; SB scleral buckle; PPV pars plana vitrectomy; VH Vitreous hemorrhage; PRP panretinal laser photocoagulation; IVK intravitreal kenalog; VMT vitreomacular traction; MH Macular hole;  NVD neovascularization of the disc; NVE neovascularization elsewhere; AREDS age related eye disease study; ARMD age related macular degeneration; POAG primary open angle glaucoma; EBMD epithelial/anterior basement membrane dystrophy; ACIOL anterior chamber intraocular lens; IOL intraocular lens; PCIOL posterior chamber intraocular lens; Phaco/IOL phacoemulsification with intraocular lens placement; [place] photorefractive keratectomy; LASIK laser assisted in situ keratomileusis; HTN hypertension; DM diabetes mellitus; COPD chronic obstructive pulmonary disease

## 2020-08-04 ENCOUNTER — Other Ambulatory Visit: Payer: Self-pay

## 2020-08-04 ENCOUNTER — Ambulatory Visit (INDEPENDENT_AMBULATORY_CARE_PROVIDER_SITE_OTHER): Payer: Medicare Other | Admitting: Ophthalmology

## 2020-08-04 ENCOUNTER — Encounter (INDEPENDENT_AMBULATORY_CARE_PROVIDER_SITE_OTHER): Payer: Self-pay | Admitting: Ophthalmology

## 2020-08-04 DIAGNOSIS — H35371 Puckering of macula, right eye: Secondary | ICD-10-CM

## 2020-08-04 NOTE — Assessment & Plan Note (Signed)
OD looks much improved with less thickening status post vitrectomy, 1 week previous.

## 2020-08-04 NOTE — Patient Instructions (Signed)
1 patient instructed to use the topical drops of the right eye for a total of 2 more weeks or less.  Patient is not to refill the medications.  If the medications are completed prior to 2 more weeks, okay to discontinue their use completely  Released all full activity in 4 days

## 2020-08-04 NOTE — Progress Notes (Signed)
08/04/2020     CHIEF COMPLAINT Patient presents for Post-op Follow-up   HISTORY OF PRESENT ILLNESS: [name] is a 74 y.o. female who presents to the clinic today for:   HPI    Post-op Follow-up    In right eye.  Discomfort includes none.  Negative for pain.  Vision is improved.  I, the attending physician,  performed the HPI with the patient and updated documentation appropriately.          Comments    6 Day s\p Vitrecomy and Membrane Peel OD. OCT  Pt states vision is stable. Has been using gtts as directed. Denies pain       Last edited by Tilda Franco on 08/04/2020  2:52 PM. (History)      Referring physician: No referring provider defined for this encounter.  HISTORICAL INFORMATION:   Selected notes from the MEDICAL RECORD NUMBER       CURRENT MEDICATIONS: Current Outpatient Medications (Ophthalmic Drugs)  Medication Sig   ofloxacin (OCUFLOX) 0.3 % ophthalmic solution Place 1 drop into the right eye 4 (four) times daily for 21 days.   Polyethyl Glycol-Propyl Glycol (SYSTANE OP) Place 1 drop into both eyes daily as needed (dry eyes).   prednisoLONE acetate (PRED FORTE) 1 % ophthalmic suspension Place 1 drop into the right eye 4 (four) times daily.   No current facility-administered medications for this visit. (Ophthalmic Drugs)   Current Outpatient Medications (Other)  Medication Sig   acetaminophen (TYLENOL) 500 MG tablet Take 500 mg by mouth every 6 (six) hours as needed for moderate pain or headache.   amLODipine (NORVASC) 2.5 MG tablet TAKE 1 TABLET BY MOUTH EVERY DAY   ELIQUIS 5 MG TABS tablet TAKE 1 TABLET BY MOUTH TWICE A DAY   losartan (COZAAR) 100 MG tablet Take 1 tablet (100 mg total) by mouth daily.   metoprolol succinate (TOPROL-XL) 50 MG 24 hr tablet TAKE 1 TABLET BY MOUTH EVERY DAY   propafenone (RYTHMOL) 300 MG tablet Take 1 tablet (300 mg total) by mouth 2 (two) times daily.   zolpidem (AMBIEN) 5 MG tablet Take 1 tablet (5  mg total) by mouth at bedtime.   No current facility-administered medications for this visit. (Other)      REVIEW OF SYSTEMS:    ALLERGIES Allergies  Allergen Reactions   Codeine Nausea And Vomiting   Other Nausea And Vomiting    general anesthesia   Oxycodone Nausea And Vomiting    PAST MEDICAL HISTORY Past Medical History:  Diagnosis Date   Acute upper respiratory infections of unspecified site    Arthritis    Carotid bruit    Carotid disease, bilateral ([place]) 01/31/2012   Mild noted on US carotid   Cataracts, both eyes    Chronic insomnia    Diverticulosis    pt denies   Dysrhythmia    a fib   Fatty liver    GERD (gastroesophageal reflux disease)    Heart murmur    Has been told by an MD.   Hiatal hernia    Denies   History of colon polyps    Noted on Colonscopy   History of gallstones    Hyperlipidemia    Hypertension    Insomnia, controlled 01/01/2014   Internal hemorrhoids    Macular pucker, left eye    surgery planned 01-14-15   Menopause    noticed in early 50's   MR (mitral regurgitation) 09/10/2014   Mild, Noted on ECHO  Other acute sinusitis    Other malaise and fatigue    Paroxysmal atrial fibrillation ([place])    on meds   Pneumonia    as a small child   PONV (postoperative nausea and vomiting)    Pulmonary hypertension ([place]) 09/10/2014   Mild, Noted on ECHO   Pure hypercholesterolemia    Restless leg syndrome    Routine general medical examination at a health care facility    Screening for lipoid disorders    Sleep apnea    no c-pap   Special screening for malignant neoplasms, colon    Uterine fibroid 09/19/1999   Noted on pelvis ultrasound   Past Surgical History:  Procedure Laterality Date   cardiolyte neg  6/06   cardiovascular stress MRI  12/20/06   afib   CHOLECYSTECTOMY     COLONOSCOPY     EYE SURGERY     cataract   Toric implant bil eyes   EYE SURGERY Right 07/29/2020   Mem Peel  and Vit - Dr. Zadie Rhine   KNEE ARTHROSCOPY     left   ORIF- L distal radius  9/07   left wrist   PARS PLANA VITRECTOMY W/ REPAIR OF MACULAR HOLE     left eye   torn meniscus     bil knees     TOTAL KNEE ARTHROPLASTY Left 05/31/2019   Procedure: LEFT TOTAL KNEE ARTHROPLASTY;  Surgeon: Mcarthur Rossetti, MD;  Location: [facility];  Service: Orthopedics;  Laterality: Left;   TOTAL KNEE ARTHROPLASTY Right 09/20/2019   Procedure: RIGHT TOTAL KNEE ARTHROPLASTY;  Surgeon: Mcarthur Rossetti, MD;  Location: [facility];  Service: Orthopedics;  Laterality: Right;   TUBAL LIGATION     UPPER GI ENDOSCOPY      FAMILY HISTORY Family History  Problem Relation Age of Onset   Breast cancer Mother    Heart disease Father    Colon cancer Cousin 36   Diabetes Other        cousins    SOCIAL HISTORY Social History   Tobacco Use   Smoking status: Never Smoker   Smokeless tobacco: Never Used  Vaping Use   Vaping Use: Never used  Substance Use Topics   Alcohol use: Not Currently    Comment: rarely   Drug use: No         OPHTHALMIC EXAM: Base Eye Exam    Visual Acuity (Snellen - Linear)      Right Left   Dist cc 20/60 20/25 +   Dist ph cc 20/40    Correction: Glasses       Tonometry (Tonopen, 2:56 PM)      Right Left   Pressure 12 11       Pupils      Pupils Dark Light Shape React APD   Right PERRL 4 4 Round Minimal None   Left PERRL 4 4 Round Minimal None       Neuro/Psych    Oriented x3: Yes   Mood/Affect: Normal       Dilation    Right eye: 1.0% Mydriacyl, 2.5% Phenylephrine @ 2:56 PM        Slit Lamp and Fundus Exam    External Exam      Right Left   External Normal Normal       Slit Lamp Exam      Right Left   Lids/Lashes Normal Normal   Conjunctiva/Sclera White and quiet White and quiet   Cornea Clear Clear  Anterior Chamber Deep and quiet Deep and quiet   Iris Round and reactive Round and reactive   Lens Posterior chamber  intraocular lens Posterior chamber intraocular lens   Anterior Vitreous Normal Normal       Fundus Exam      Right Left   Posterior Vitreous Clear, vitrectomized    Disc Normal    C/D Ratio 0.3    Macula Much less topographic distortion centrally    Vessels Normal    Periphery Normal           IMAGING AND PROCEDURES  Imaging and Procedures for 08/04/20  OCT, Retina - OU - Both Eyes       Right Eye Quality was good. Scan locations included subfoveal. Central Foveal Thickness: 402. Progression has improved.   Left Eye Quality was good. Scan locations included subfoveal. Central Foveal Thickness: 387. Progression has improved.   Notes One Week status post vitrectomy and ILM peel OD with improved retinal thickening                   ASSESSMENT/PLAN:  Macular pucker, right eye OD looks much improved with less thickening status post vitrectomy, 1 week previous.      ICD-10-CM   1. Macular pucker, right eye  H35.371 OCT, Retina - OU - Both Eyes    1.  Deep, stable vision and improved anatomy 1 week post vitrectomy membrane peel.  2.  3.  Ophthalmic Meds Ordered this visit:  No orders of the defined types were placed in this encounter.      Return in about 8 weeks (around 09/29/2020) for dilate, OD, OCT.  Patient Instructions  1 patient instructed to use the topical drops of the right eye for a total of 2 more weeks or less.  Patient is not to refill the medications.  If the medications are completed prior to 2 more weeks, okay to discontinue their use completely  Released all full activity in 4 days    Explained the diagnoses, plan, and follow up with the patient and they expressed understanding.  Patient expressed understanding of the importance of proper follow up care.   Clent Demark Quinlee Sciarra M.D. Diseases & Surgery of the Retina and Vitreous Retina & Diabetic [place] 08/04/20     Abbreviations: M myopia (nearsighted); A astigmatism; H  hyperopia (farsighted); P presbyopia; Mrx spectacle prescription;  CTL contact lenses; OD right eye; OS left eye; OU both eyes  XT exotropia; ET esotropia; PEK punctate epithelial keratitis; PEE punctate epithelial erosions; DES dry eye syndrome; MGD meibomian gland dysfunction; ATs artificial tears; PFAT's preservative free artificial tears; [place] nuclear sclerotic cataract; PSC posterior subcapsular cataract; ERM epi-retinal membrane; PVD posterior vitreous detachment; RD retinal detachment; DM diabetes mellitus; DR diabetic retinopathy; NPDR non-proliferative diabetic retinopathy; PDR proliferative diabetic retinopathy; CSME clinically significant macular edema; DME diabetic macular edema; dbh dot blot hemorrhages; CWS cotton wool spot; POAG primary open angle glaucoma; C/D cup-to-disc ratio; HVF humphrey visual field; GVF goldmann visual field; OCT optical coherence tomography; IOP intraocular pressure; BRVO Branch retinal vein occlusion; CRVO central retinal vein occlusion; CRAO central retinal artery occlusion; BRAO branch retinal artery occlusion; RT retinal tear; SB scleral buckle; PPV pars plana vitrectomy; VH Vitreous hemorrhage; PRP panretinal laser photocoagulation; IVK intravitreal kenalog; VMT vitreomacular traction; MH Macular hole;  NVD neovascularization of the disc; NVE neovascularization elsewhere; AREDS age related eye disease study; ARMD age related macular degeneration; POAG primary open angle glaucoma; EBMD epithelial/anterior basement membrane dystrophy; ACIOL  anterior chamber intraocular lens; IOL intraocular lens; PCIOL posterior chamber intraocular lens; Phaco/IOL phacoemulsification with intraocular lens placement; [place] photorefractive keratectomy; LASIK laser assisted in situ keratomileusis; HTN hypertension; DM diabetes mellitus; COPD chronic obstructive pulmonary disease

## 2020-08-06 ENCOUNTER — Encounter (INDEPENDENT_AMBULATORY_CARE_PROVIDER_SITE_OTHER): Payer: Medicare Other | Admitting: Ophthalmology

## 2020-08-28 DIAGNOSIS — H52223 Regular astigmatism, bilateral: Secondary | ICD-10-CM | POA: Diagnosis not present

## 2020-08-28 DIAGNOSIS — Z9842 Cataract extraction status, left eye: Secondary | ICD-10-CM | POA: Diagnosis not present

## 2020-08-28 DIAGNOSIS — Z9841 Cataract extraction status, right eye: Secondary | ICD-10-CM | POA: Diagnosis not present

## 2020-09-08 ENCOUNTER — Ambulatory Visit (INDEPENDENT_AMBULATORY_CARE_PROVIDER_SITE_OTHER): Payer: Medicare Other | Admitting: Orthopaedic Surgery

## 2020-09-08 ENCOUNTER — Ambulatory Visit (INDEPENDENT_AMBULATORY_CARE_PROVIDER_SITE_OTHER): Payer: Medicare Other

## 2020-09-08 ENCOUNTER — Ambulatory Visit: Payer: Self-pay

## 2020-09-08 ENCOUNTER — Encounter: Payer: Self-pay | Admitting: Orthopaedic Surgery

## 2020-09-08 DIAGNOSIS — M7542 Impingement syndrome of left shoulder: Secondary | ICD-10-CM | POA: Diagnosis not present

## 2020-09-08 DIAGNOSIS — M79671 Pain in right foot: Secondary | ICD-10-CM

## 2020-09-08 DIAGNOSIS — Z96651 Presence of right artificial knee joint: Secondary | ICD-10-CM

## 2020-09-08 DIAGNOSIS — Z96652 Presence of left artificial knee joint: Secondary | ICD-10-CM

## 2020-09-08 MED ORDER — METHYLPREDNISOLONE ACETATE 40 MG/ML IJ SUSP
40.0000 mg | INTRAMUSCULAR | Status: AC | PRN
  Administered 2020-09-08: 40 mg via INTRA_ARTICULAR

## 2020-09-08 MED ORDER — LIDOCAINE HCL 1 % IJ SOLN
3.0000 mL | INTRAMUSCULAR | Status: AC | PRN
  Administered 2020-09-08: 3 mL

## 2020-09-08 NOTE — Progress Notes (Signed)
Office Visit Note   Patient: [name]           Date of Birth: 15-Jan-1946           MRN: [id number] Visit Date: 09/08/2020              Requested by: No referring provider defined for this encounter. PCP: Patient, No Pcp Per   Assessment & Plan: Visit Diagnoses:  1. Status post total left knee replacement   2. Impingement syndrome of left shoulder   3. Right foot pain     Plan: She is given a prescription for physical therapy to work on strengthening both knees.  Also shown her some exercises that she can do for her left shoulder.  We will see her back in 6 weeks for follow-up of her knees.  She can follow-up in about 2 weeks if she continues to have left shoulder pain despite conservative measures including injection which she tolerated well today.  If she returns for left shoulder pain would recommend 3 views of the left shoulder at that time.  In regards to her first and second tarsal metatarsal arthritic changes we will refer her to Dr. Sharol Given for evaluation treatment.  She has failed conservative treatment which included shoes and Voltaren gel.  She shown how to lace her shoes so that she can wear more supportive shoe without the shoe strings crossing over the first and second metatarsal region.  Follow-Up Instructions: Return in about 6 weeks (around 10/20/2020).   Orders:  Orders Placed This Encounter  Procedures  . Large Joint Inj  . XR Knee 1-2 Views Left  . XR Knee 1-2 Views Right   No orders of the defined types were placed in this encounter.     Procedures: Large Joint Inj: L subacromial bursa on 09/08/2020 8:43 AM Indications: pain Details: 22 G 1.5 in needle, superior approach  Arthrogram: No  Medications: 3 mL lidocaine 1 %; 40 mg methylPREDNISolone acetate 40 MG/ML Outcome: tolerated well, no immediate complications Procedure, treatment alternatives, risks and benefits explained, specific risks discussed. Consent was given by the patient. Immediately  prior to procedure a time out was called to verify the correct patient, procedure, equipment, support staff and site/side marked as required. Patient was prepped and draped in the usual sterile fashion.       Clinical Data: No additional findings.   Subjective: Chief Complaint  Patient presents with  . Left Knee - Follow-up  . Right Knee - Follow-up    HPI [name] returns today follow-up of her right knee.  She states right knee is doing okay but bothers her at times.  Does feel like the knee might give way at times.  She has some clicking in the left knee but no real pain in the left knee.  She is wondering if the right knee is making her right foot hurt.  Again she had radiographs at last visit of her right foot which showed significant first and second tarsal metatarsal joint arthritic changes.  She has tried changing her shoewear but states that the shoestrings cross over that area bother.  She is also tried Voltaren gel which has not helped. She also asking about her shoulder which is been bothering her for the past month no known injury.  No prior pain in the shoulder.  She denies any numbness tingling down the arm.  Patient is nondiabetic.  She has had no recent vaccines. Review of Systems Please see HPI  Objective: Vital Signs: There were no vitals taken for this visit.  Physical Exam Constitutional:      Appearance: She is not ill-appearing or diaphoretic.  Pulmonary:     Effort: Pulmonary effort is normal.  Neurological:     Mental Status: She is alert and oriented to person, place, and time.  Psychiatric:        Mood and Affect: Mood normal.     Ortho Exam Bilateral knees excellent range of motion both knees negative abnormal warmth erythema or effusion of either knee.  She has slight opening of valgus varus stressing of both knees.  Slight click under the patella left knee with range of motion.  Left knee with slight anterior drawer right knee negative.  Right  knee negative anterior drawer. Right foot dorsal pedal pulses present.  There is no rashes skin lesions ulcerations.  Edema over the dorsal aspect of the foot.  She has tenderness over the first and second metatarsal tarsal joints.  Slight hypermobility with motion of the first metatarsal tarsal joint.  This motion also causes pain. Left shoulder full overhead motion with pain.  Positive impingement testing.  Bilateral shoulders negative for weakness with external or internal rotation.  Empty can test 4 out of 5 strength on the left compared to 5 out of 5 on the right. Specialty Comments:  No specialty comments available.  Imaging: XR Knee 1-2 Views Left  Result Date: 09/08/2020 Left knee 2 views: Well-seated left total knee arthroplasty no acute fractures or acute findings.  Knee is well located.  XR Knee 1-2 Views Right  Result Date: 09/08/2020 Right knee AP lateral views: Shows well-seated right total knee arthroplasty with no complicating features.  No acute fractures.  Knee is well located.    PMFS History: Patient Active Problem List   Diagnosis Date Noted  . Postoperative follow-up 07/30/2020  . Macular pucker, right eye 06/09/2020  . History of vitrectomy 06/09/2020  . Diarrhea 02/25/2020  . Chest pain 02/13/2020  . Shingles 02/13/2020  . Status post total right knee replacement 09/20/2019  . Presbycusis of both ears 09/17/2019  . Psychophysiological insomnia 07/22/2019  . Status post total left knee replacement 05/31/2019  . Unilateral primary osteoarthritis, right knee 04/23/2019  . Unilateral primary osteoarthritis, left knee 04/23/2019  . Obesity due to excess calories with serious comorbidity 07/18/2018  . Chronic insomnia 07/18/2018  . Anxiety associated with depression 07/18/2018  . Morbid obesity due to excess calories ([place]) 04/25/2016  . OSA (obstructive sleep apnea) 04/25/2016  . Paroxysmal atrial fibrillation ([place]) 04/25/2016  . Obstructive sleep apnea  06/04/2015  . Insomnia w/ sleep apnea 06/04/2015  . Hypoxemia 06/04/2015  . Encounter for therapeutic drug monitoring 02/10/2015  . Insomnia, controlled 01/01/2014  . Skin lesion of breast 06/05/2013  . Urinary incontinence, mixed 06/05/2013  . Chronic anticoagulation 09/12/2012  . Atrial fibrillation ([place]) 01/10/2011  . Carotid stenosis 01/10/2011  . DYSPNEA 01/10/2011  . COLONIC POLYPS, ADENOMATOUS, HX OF 11/04/2010  . HEMORRHOIDS-INTERNAL 11/03/2010  . HIATAL HERNIA 11/03/2010  . DIVERTICULAR DISEASE 11/03/2010  . RECTAL BLEEDING 11/03/2010  . FATTY LIVER DISEASE, HX OF 11/03/2010  . HYPERCHOLESTEROLEMIA 08/10/2010  . Other fatigue 12/22/2009  . CHOLELITHIASIS 08/06/2008  . Essential hypertension 07/25/2008  . INSOMNIA, CHRONIC 05/21/2007  . RESTLESS LEG SYNDROME 05/21/2007  . SLEEP APNEA 05/21/2007   Past Medical History:  Diagnosis Date  . Acute upper respiratory infections of unspecified site   . Arthritis   . Carotid bruit   .  Carotid disease, bilateral ([place]) 01/31/2012   Mild noted on US carotid  . Cataracts, both eyes   . Chronic insomnia   . Diverticulosis    pt denies  . Dysrhythmia    a fib  . Fatty liver   . GERD (gastroesophageal reflux disease)   . Heart murmur    Has been told by an MD.  . Hiatal hernia    Denies  . History of colon polyps    Noted on Colonscopy  . History of gallstones   . Hyperlipidemia   . Hypertension   . Insomnia, controlled 01/01/2014  . Internal hemorrhoids   . Macular pucker, left eye    surgery planned 01-14-15  . Menopause    noticed in early 60's  . MR (mitral regurgitation) 09/10/2014   Mild, Noted on ECHO   . Other acute sinusitis   . Other malaise and fatigue   . Paroxysmal atrial fibrillation ([facility])    on meds  . Pneumonia    as a small child  . PONV (postoperative nausea and vomiting)   . Pulmonary hypertension ([place]) 09/10/2014   Mild, Noted on ECHO  . Pure hypercholesterolemia   . Restless leg syndrome     . Routine general medical examination at a health care facility   . Screening for lipoid disorders   . Sleep apnea    no c-pap  . Special screening for malignant neoplasms, colon   . Uterine fibroid 09/19/1999   Noted on pelvis ultrasound    Family History  Problem Relation Age of Onset  . Breast cancer Mother   . Heart disease Father   . Colon cancer Cousin 45  . Diabetes Other        cousins    Past Surgical History:  Procedure Laterality Date  . cardiolyte neg  6/06  . cardiovascular stress MRI  12/20/06   afib  . CHOLECYSTECTOMY    . COLONOSCOPY    . EYE SURGERY     cataract   Toric implant bil eyes  . EYE SURGERY Right 07/29/2020   Mem Peel and Vit - Dr. Zadie Rhine  . KNEE ARTHROSCOPY     left  . ORIF- L distal radius  9/07   left wrist  . PARS PLANA VITRECTOMY W/ REPAIR OF MACULAR HOLE     left eye  . torn meniscus     bil knees    . TOTAL KNEE ARTHROPLASTY Left 05/31/2019   Procedure: LEFT TOTAL KNEE ARTHROPLASTY;  Surgeon: Mcarthur Rossetti, MD;  Location: [facility];  Service: Orthopedics;  Laterality: Left;  . TOTAL KNEE ARTHROPLASTY Right 09/20/2019   Procedure: RIGHT TOTAL KNEE ARTHROPLASTY;  Surgeon: Mcarthur Rossetti, MD;  Location: [facility];  Service: Orthopedics;  Laterality: Right;  . TUBAL LIGATION    . UPPER GI ENDOSCOPY     Social History   Occupational History  . Occupation: Product manager: [employer]  Tobacco Use  . Smoking status: Never Smoker  . Smokeless tobacco: Never Used  Vaping Use  . Vaping Use: Never used  Substance and Sexual Activity  . Alcohol use: Not Currently    Comment: rarely  . Drug use: No  . Sexual activity: Never

## 2020-09-14 ENCOUNTER — Encounter: Payer: Self-pay | Admitting: Orthopedic Surgery

## 2020-09-14 ENCOUNTER — Ambulatory Visit (INDEPENDENT_AMBULATORY_CARE_PROVIDER_SITE_OTHER): Payer: Medicare Other | Admitting: Orthopedic Surgery

## 2020-09-14 DIAGNOSIS — M19071 Primary osteoarthritis, right ankle and foot: Secondary | ICD-10-CM

## 2020-09-14 NOTE — Progress Notes (Signed)
Office Visit Note   Patient: [name]           Date of Birth: Aug 08, 1946           MRN: [id number] Visit Date: 09/14/2020              Requested by: Pete Pelt, PA-C [id number] [place],  [place] [id number] PCP: Patient, No Pcp Per  Chief Complaint  Patient presents with  . Right Foot - Pain      HPI: Patient is a 74 year old woman status post bilateral total knees who states that she has been having increasing midfoot pain on the right. She states she walks with her foot externally rotated and has been putting extra pressure over the inside part of her foot she complains of a painful lump that makes it difficult to wear different shoes. Pain primarily over the dorsum of the right foot.  Assessment & Plan: Visit Diagnoses:  1. Arthritis of ankle or foot, degenerative, right     Plan: Recommended Voltaren gel 3 times a day recommended a stiff soled sneaker with a over-the-counter orthotic for better arch support. Discussed that if she fails conservative treatment we could proceed with fusion of the base of the first and second metatarsals.  Follow-Up Instructions: Return if symptoms worsen or fail to improve.   Ortho Exam  Patient is alert, oriented, no adenopathy, well-dressed, normal affect, normal respiratory effort. Examination patient has a palpable dorsalis pedis pulse she has painful bony spur at the base of the first and second metatarsal this is the area of her pain distraction across the base of the first metatarsal reproduces her symptoms. Review of the radiographs does show some peripheral vascular disease with calcification of arteries and does show arthritis with joint collapse of the base of the first metatarsal medial cuneiform. Patient has good subtalar motion and dorsiflexion of the ankle to neutral.  Imaging: No results found. No images are attached to the encounter.  Labs: Lab Results  Component Value Date   LABORGA NO GROWTH 06/05/2013      Lab Results  Component Value Date   ALBUMIN 3.9 07/22/2020   ALBUMIN 4.0 12/25/2018   ALBUMIN 4.0 09/24/2015    No results found for: MG Lab Results  Component Value Date   VD25OH 39 12/22/2009    No results found for: PREALBUMIN CBC EXTENDED Latest Ref Rng & Units 07/22/2020 09/21/2019 09/17/2019  WBC 3.4 - 10.8 x10E3/uL 5.9 11.6(H) 6.5  RBC 3.77 - 5.28 x10E6/uL 4.31 3.77(L) 4.48  HGB 11.1 - 15.9 g/dL 13.3 11.4(L) 13.6  HCT 34.0 - 46.6 % 38.8 35.2(L) 41.6  PLT 150 - 450 x10E3/uL 207 191 214  NEUTROABS 1.40 - 7.00 x10E3/uL 3.1 - -  LYMPHSABS 0 - 3 x10E3/uL 2.0 - -     There is no height or weight on file to calculate BMI.  Orders:  No orders of the defined types were placed in this encounter.  No orders of the defined types were placed in this encounter.    Procedures: No procedures performed  Clinical Data: No additional findings.  ROS:  All other systems negative, except as noted in the HPI. Review of Systems  Objective: Vital Signs: There were no vitals taken for this visit.  Specialty Comments:  No specialty comments available.  PMFS History: Patient Active Problem List   Diagnosis Date Noted  . Postoperative follow-up 07/30/2020  . Macular pucker, right eye 06/09/2020  . History of vitrectomy 06/09/2020  .  Diarrhea 02/25/2020  . Chest pain 02/13/2020  . Shingles 02/13/2020  . Status post total right knee replacement 09/20/2019  . Presbycusis of both ears 09/17/2019  . Psychophysiological insomnia 07/22/2019  . Status post total left knee replacement 05/31/2019  . Unilateral primary osteoarthritis, right knee 04/23/2019  . Unilateral primary osteoarthritis, left knee 04/23/2019  . Obesity due to excess calories with serious comorbidity 07/18/2018  . Chronic insomnia 07/18/2018  . Anxiety associated with depression 07/18/2018  . Morbid obesity due to excess calories ([place]) 04/25/2016  . OSA (obstructive sleep apnea) 04/25/2016  .  Paroxysmal atrial fibrillation ([place]) 04/25/2016  . Obstructive sleep apnea 06/04/2015  . Insomnia w/ sleep apnea 06/04/2015  . Hypoxemia 06/04/2015  . Encounter for therapeutic drug monitoring 02/10/2015  . Insomnia, controlled 01/01/2014  . Skin lesion of breast 06/05/2013  . Urinary incontinence, mixed 06/05/2013  . Chronic anticoagulation 09/12/2012  . Atrial fibrillation ([place]) 01/10/2011  . Carotid stenosis 01/10/2011  . DYSPNEA 01/10/2011  . COLONIC POLYPS, ADENOMATOUS, HX OF 11/04/2010  . HEMORRHOIDS-INTERNAL 11/03/2010  . HIATAL HERNIA 11/03/2010  . DIVERTICULAR DISEASE 11/03/2010  . RECTAL BLEEDING 11/03/2010  . FATTY LIVER DISEASE, HX OF 11/03/2010  . HYPERCHOLESTEROLEMIA 08/10/2010  . Other fatigue 12/22/2009  . CHOLELITHIASIS 08/06/2008  . Essential hypertension 07/25/2008  . INSOMNIA, CHRONIC 05/21/2007  . RESTLESS LEG SYNDROME 05/21/2007  . SLEEP APNEA 05/21/2007   Past Medical History:  Diagnosis Date  . Acute upper respiratory infections of unspecified site   . Arthritis   . Carotid bruit   . Carotid disease, bilateral ([place]) 01/31/2012   Mild noted on US carotid  . Cataracts, both eyes   . Chronic insomnia   . Diverticulosis    pt denies  . Dysrhythmia    a fib  . Fatty liver   . GERD (gastroesophageal reflux disease)   . Heart murmur    Has been told by an MD.  . Hiatal hernia    Denies  . History of colon polyps    Noted on Colonscopy  . History of gallstones   . Hyperlipidemia   . Hypertension   . Insomnia, controlled 01/01/2014  . Internal hemorrhoids   . Macular pucker, left eye    surgery planned 01-14-15  . Menopause    noticed in early 49's  . MR (mitral regurgitation) 09/10/2014   Mild, Noted on ECHO   . Other acute sinusitis   . Other malaise and fatigue   . Paroxysmal atrial fibrillation ([facility])    on meds  . Pneumonia    as a small child  . PONV (postoperative nausea and vomiting)   . Pulmonary hypertension ([place]) 09/10/2014    Mild, Noted on ECHO  . Pure hypercholesterolemia   . Restless leg syndrome   . Routine general medical examination at a health care facility   . Screening for lipoid disorders   . Sleep apnea    no c-pap  . Special screening for malignant neoplasms, colon   . Uterine fibroid 09/19/1999   Noted on pelvis ultrasound    Family History  Problem Relation Age of Onset  . Breast cancer Mother   . Heart disease Father   . Colon cancer Cousin 16  . Diabetes Other        cousins    Past Surgical History:  Procedure Laterality Date  . cardiolyte neg  6/06  . cardiovascular stress MRI  12/20/06   afib  . CHOLECYSTECTOMY    . COLONOSCOPY    .  EYE SURGERY     cataract   Toric implant bil eyes  . EYE SURGERY Right 07/29/2020   Mem Peel and Vit - Dr. Zadie Rhine  . KNEE ARTHROSCOPY     left  . ORIF- L distal radius  9/07   left wrist  . PARS PLANA VITRECTOMY W/ REPAIR OF MACULAR HOLE     left eye  . torn meniscus     bil knees    . TOTAL KNEE ARTHROPLASTY Left 05/31/2019   Procedure: LEFT TOTAL KNEE ARTHROPLASTY;  Surgeon: Mcarthur Rossetti, MD;  Location: [facility];  Service: Orthopedics;  Laterality: Left;  . TOTAL KNEE ARTHROPLASTY Right 09/20/2019   Procedure: RIGHT TOTAL KNEE ARTHROPLASTY;  Surgeon: Mcarthur Rossetti, MD;  Location: [facility];  Service: Orthopedics;  Laterality: Right;  . TUBAL LIGATION    . UPPER GI ENDOSCOPY     Social History   Occupational History  . Occupation: Product manager: [employer]  Tobacco Use  . Smoking status: Never Smoker  . Smokeless tobacco: Never Used  Vaping Use  . Vaping Use: Never used  Substance and Sexual Activity  . Alcohol use: Not Currently    Comment: rarely  . Drug use: No  . Sexual activity: Never

## 2020-09-16 ENCOUNTER — Telehealth: Payer: Self-pay | Admitting: *Deleted

## 2020-09-16 NOTE — Telephone Encounter (Signed)
1 year Ortho bundle call completed for Right TKA.

## 2020-09-20 ENCOUNTER — Other Ambulatory Visit: Payer: Self-pay | Admitting: Cardiology

## 2020-09-21 NOTE — Telephone Encounter (Signed)
Prescription refill request for Eliquis received. Indication:  Atrial Fibrillation Last office visit: 07/2020  [place] Scr: 1.16  07/2020 Age: 74 Weight: 85.3 kg  Prescription refilled

## 2020-09-29 ENCOUNTER — Ambulatory Visit (INDEPENDENT_AMBULATORY_CARE_PROVIDER_SITE_OTHER): Payer: Medicare Other | Admitting: Ophthalmology

## 2020-09-29 ENCOUNTER — Other Ambulatory Visit: Payer: Self-pay

## 2020-09-29 ENCOUNTER — Encounter (INDEPENDENT_AMBULATORY_CARE_PROVIDER_SITE_OTHER): Payer: Self-pay | Admitting: Ophthalmology

## 2020-09-29 DIAGNOSIS — H35371 Puckering of macula, right eye: Secondary | ICD-10-CM | POA: Diagnosis not present

## 2020-09-29 NOTE — Assessment & Plan Note (Signed)
Continued improvement in macular topography now some 2 months status post vitrectomy membrane peel OD

## 2020-09-29 NOTE — Progress Notes (Signed)
09/29/2020     CHIEF COMPLAINT Patient presents for Post-op Follow-up   HISTORY OF PRESENT ILLNESS: [name] is a 74 y.o. female who presents to the clinic today for:   HPI    Post-op Follow-up    In right eye.  Vision is improved.          Comments    8 Week POV OD  Pt sts, "I think it has cleared up some, but it has not been a great deal" OD. No ocular pain. VA stable OS.       Last edited by Rockie Neighbours, [place] on 09/29/2020  2:07 PM. (History)      Referring physician: No referring provider defined for this encounter.  HISTORICAL INFORMATION:   Selected notes from the MEDICAL RECORD NUMBER       CURRENT MEDICATIONS: Current Outpatient Medications (Ophthalmic Drugs)  Medication Sig  . Polyethyl Glycol-Propyl Glycol (SYSTANE OP) Place 1 drop into both eyes daily as needed (dry eyes).  . prednisoLONE acetate (PRED FORTE) 1 % ophthalmic suspension Place 1 drop into the right eye 4 (four) times daily. (Patient not taking: Reported on 09/29/2020)   No current facility-administered medications for this visit. (Ophthalmic Drugs)   Current Outpatient Medications (Other)  Medication Sig  . acetaminophen (TYLENOL) 500 MG tablet Take 500 mg by mouth every 6 (six) hours as needed for moderate pain or headache.  Marland Kitchen amLODipine (NORVASC) 2.5 MG tablet TAKE 1 TABLET BY MOUTH EVERY DAY  . ELIQUIS 5 MG TABS tablet TAKE 1 TABLET BY MOUTH TWICE A DAY  . losartan (COZAAR) 100 MG tablet Take 1 tablet (100 mg total) by mouth daily.  . metoprolol succinate (TOPROL-XL) 50 MG 24 hr tablet TAKE 1 TABLET BY MOUTH EVERY DAY  . propafenone (RYTHMOL) 300 MG tablet Take 1 tablet (300 mg total) by mouth 2 (two) times daily.  Marland Kitchen zolpidem (AMBIEN) 5 MG tablet Take 1 tablet (5 mg total) by mouth at bedtime.   No current facility-administered medications for this visit. (Other)      REVIEW OF SYSTEMS:    ALLERGIES Allergies  Allergen Reactions  . Codeine Nausea And Vomiting    . Other Nausea And Vomiting    general anesthesia  . Oxycodone Nausea And Vomiting    PAST MEDICAL HISTORY Past Medical History:  Diagnosis Date  . Acute upper respiratory infections of unspecified site   . Arthritis   . Carotid bruit   . Carotid disease, bilateral ([place]) 01/31/2012   Mild noted on US carotid  . Cataracts, both eyes   . Chronic insomnia   . Diverticulosis    pt denies  . Dysrhythmia    a fib  . Fatty liver   . GERD (gastroesophageal reflux disease)   . Heart murmur    Has been told by an MD.  . Hiatal hernia    Denies  . History of colon polyps    Noted on Colonscopy  . History of gallstones   . Hyperlipidemia   . Hypertension   . Insomnia, controlled 01/01/2014  . Internal hemorrhoids   . Macular pucker, left eye    surgery planned 01-14-15  . Menopause    noticed in early 60's  . MR (mitral regurgitation) 09/10/2014   Mild, Noted on ECHO   . Other acute sinusitis   . Other malaise and fatigue   . Paroxysmal atrial fibrillation (HCC)    on meds  . Pneumonia    as a  small child  . PONV (postoperative nausea and vomiting)   . Pulmonary hypertension ([place]) 09/10/2014   Mild, Noted on ECHO  . Pure hypercholesterolemia   . Restless leg syndrome   . Routine general medical examination at a health care facility   . Screening for lipoid disorders   . Sleep apnea    no c-pap  . Special screening for malignant neoplasms, colon   . Uterine fibroid 09/19/1999   Noted on pelvis ultrasound   Past Surgical History:  Procedure Laterality Date  . cardiolyte neg  6/06  . cardiovascular stress MRI  12/20/06   afib  . CHOLECYSTECTOMY    . COLONOSCOPY    . EYE SURGERY     cataract   Toric implant bil eyes  . EYE SURGERY Right 07/29/2020   Mem Peel and Vit - Dr. Zadie Rhine  . KNEE ARTHROSCOPY     left  . ORIF- L distal radius  9/07   left wrist  . PARS PLANA VITRECTOMY W/ REPAIR OF MACULAR HOLE     left eye  . torn meniscus     bil knees    . TOTAL KNEE  ARTHROPLASTY Left 05/31/2019   Procedure: LEFT TOTAL KNEE ARTHROPLASTY;  Surgeon: Mcarthur Rossetti, MD;  Location: [facility];  Service: Orthopedics;  Laterality: Left;  . TOTAL KNEE ARTHROPLASTY Right 09/20/2019   Procedure: RIGHT TOTAL KNEE ARTHROPLASTY;  Surgeon: Mcarthur Rossetti, MD;  Location: [facility];  Service: Orthopedics;  Laterality: Right;  . TUBAL LIGATION    . UPPER GI ENDOSCOPY      FAMILY HISTORY Family History  Problem Relation Age of Onset  . Breast cancer Mother   . Heart disease Father   . Colon cancer Cousin 84  . Diabetes Other        cousins    SOCIAL HISTORY Social History   Tobacco Use  . Smoking status: Never Smoker  . Smokeless tobacco: Never Used  Vaping Use  . Vaping Use: Never used  Substance Use Topics  . Alcohol use: Not Currently    Comment: rarely  . Drug use: No         OPHTHALMIC EXAM:  Base Eye Exam    Visual Acuity (ETDRS)      Right Left   Dist cc 20/20 -1 20/20 -1   Correction: Glasses       Tonometry (Tonopen, 2:09 PM)      Right Left   Pressure 12 13       Pupils      Pupils Dark Light Shape React APD   Right PERRL 6 6 Round Minimal None   Left PERRL 6 6 Round Minimal None       Visual Fields (Counting fingers)      Left Right    Full Full       Extraocular Movement      Right Left    Full Full       Neuro/Psych    Oriented x3: Yes   Mood/Affect: Normal       Dilation    Right eye: 1.0% Mydriacyl, 2.5% Phenylephrine @ 2:11 PM        Slit Lamp and Fundus Exam    External Exam      Right Left   External Normal Normal       Slit Lamp Exam      Right Left   Lids/Lashes Normal Normal   Conjunctiva/Sclera White and quiet White and quiet  Cornea Clear Clear   Anterior Chamber Deep and quiet Deep and quiet   Iris Round and reactive Round and reactive   Lens Posterior chamber intraocular lens, 1+ Posterior capsular opacification Posterior chamber intraocular lens   Anterior Vitreous Normal  Normal       Fundus Exam      Right Left   Posterior Vitreous Clear, vitrectomized    Disc Normal    C/D Ratio 0.6    Macula No topographic distortion centrally    Vessels Normal    Periphery Normal           IMAGING AND PROCEDURES  Imaging and Procedures for 09/29/20  OCT, Retina - OU - Both Eyes       Right Eye Quality was good. Scan locations included subfoveal. Central Foveal Thickness: 375. Progression has improved.   Left Eye Quality was good. Scan locations included subfoveal. Central Foveal Thickness: 400. Progression has been stable.   Notes Much less topographic distortion and improving macular thickening of the right eye now 8 weeks status post Vitrectomy and membrane peel right eye for epiretinal membrane                ASSESSMENT/PLAN:  Macular pucker, right eye Continued improvement in macular topography now some 2 months status post vitrectomy membrane peel OD      ICD-10-CM   1. Macular pucker, right eye  H35.371 OCT, Retina - OU - Both Eyes    1.  Patient continues to heal nicely, no complications, acuity is improved from 2060 preoperative to now 2020  2.  3.  Ophthalmic Meds Ordered this visit:  No orders of the defined types were placed in this encounter.      Return in about 6 months (around 03/29/2021) for DILATE OU, OCT.  There are no Patient Instructions on file for this visit.   Explained the diagnoses, plan, and follow up with the patient and they expressed understanding.  Patient expressed understanding of the importance of proper follow up care.   Clent Demark Oliwia Berzins M.D. Diseases & Surgery of the Retina and Vitreous Retina & Diabetic [place] 09/29/20     Abbreviations: M myopia (nearsighted); A astigmatism; H hyperopia (farsighted); P presbyopia; Mrx spectacle prescription;  CTL contact lenses; OD right eye; OS left eye; OU both eyes  XT exotropia; ET esotropia; PEK punctate epithelial keratitis; PEE punctate  epithelial erosions; DES dry eye syndrome; MGD meibomian gland dysfunction; ATs artificial tears; PFAT's preservative free artificial tears; [place] nuclear sclerotic cataract; PSC posterior subcapsular cataract; ERM epi-retinal membrane; PVD posterior vitreous detachment; RD retinal detachment; DM diabetes mellitus; DR diabetic retinopathy; NPDR non-proliferative diabetic retinopathy; PDR proliferative diabetic retinopathy; CSME clinically significant macular edema; DME diabetic macular edema; dbh dot blot hemorrhages; CWS cotton wool spot; POAG primary open angle glaucoma; C/D cup-to-disc ratio; HVF humphrey visual field; GVF goldmann visual field; OCT optical coherence tomography; IOP intraocular pressure; BRVO Branch retinal vein occlusion; CRVO central retinal vein occlusion; CRAO central retinal artery occlusion; BRAO branch retinal artery occlusion; RT retinal tear; SB scleral buckle; PPV pars plana vitrectomy; VH Vitreous hemorrhage; PRP panretinal laser photocoagulation; IVK intravitreal kenalog; VMT vitreomacular traction; MH Macular hole;  NVD neovascularization of the disc; NVE neovascularization elsewhere; AREDS age related eye disease study; ARMD age related macular degeneration; POAG primary open angle glaucoma; EBMD epithelial/anterior basement membrane dystrophy; ACIOL anterior chamber intraocular lens; IOL intraocular lens; PCIOL posterior chamber intraocular lens; Phaco/IOL phacoemulsification with intraocular lens placement; PRK photorefractive keratectomy; LASIK laser  assisted in situ keratomileusis; HTN hypertension; DM diabetes mellitus; COPD chronic obstructive pulmonary disease

## 2020-10-06 ENCOUNTER — Ambulatory Visit (INDEPENDENT_AMBULATORY_CARE_PROVIDER_SITE_OTHER): Payer: Medicare Other | Admitting: Orthopaedic Surgery

## 2020-10-06 ENCOUNTER — Encounter: Payer: Self-pay | Admitting: Orthopaedic Surgery

## 2020-10-06 ENCOUNTER — Ambulatory Visit (INDEPENDENT_AMBULATORY_CARE_PROVIDER_SITE_OTHER): Payer: Medicare Other

## 2020-10-06 DIAGNOSIS — M79671 Pain in right foot: Secondary | ICD-10-CM

## 2020-10-06 DIAGNOSIS — M7542 Impingement syndrome of left shoulder: Secondary | ICD-10-CM | POA: Diagnosis not present

## 2020-10-06 NOTE — Progress Notes (Signed)
The patient comes in today with continued complaints of left shoulder pain and some neck pain as well as right foot pain.  I did send her to Dr. Sharol Given for her right foot due to her midfoot arthritis and he is treating this.  I did provide a steroid injection of the left shoulder subacromial space about a month ago.  That is helped somewhat.  She still work on activity modification to try and use her shoulder is much as she can.  Her neck pain seems to be more activity related.  There is no radicular symptoms in her upper extremities.  I did obtain new x-rays of her left shoulder.  There is an inferior osteophyte off the humeral head and some narrowing of the glenohumeral joint.  The shoulder is well located and not too high riding at all.  The [facility] joint is wide open but there is decrease in the subacromial outlet.  On examination her left shoulder does move smoothly and fluidly with a little bit of pain deeply in the shoulder joint itself.  I would like to send her to Dr. Junius Roads for an intra-articular steroid injection in her left shoulder joint.  Also gave her prescription for outpatient physical therapy to work on her neck, her left shoulder and her right foot.  All questions and concerns were answered and addressed.  Once Dr. Junius Roads is able to see her as a consultation and provide a steroid injection in her left shoulder and can then get her back to me about 6 weeks after that.  All questions and concerns were answered and addressed.

## 2020-10-08 ENCOUNTER — Ambulatory Visit (INDEPENDENT_AMBULATORY_CARE_PROVIDER_SITE_OTHER): Payer: Medicare Other | Admitting: Family Medicine

## 2020-10-08 ENCOUNTER — Other Ambulatory Visit: Payer: Self-pay | Admitting: Cardiology

## 2020-10-08 ENCOUNTER — Encounter: Payer: Self-pay | Admitting: Family Medicine

## 2020-10-08 ENCOUNTER — Ambulatory Visit: Payer: Self-pay

## 2020-10-08 ENCOUNTER — Other Ambulatory Visit: Payer: Self-pay

## 2020-10-08 DIAGNOSIS — M7542 Impingement syndrome of left shoulder: Secondary | ICD-10-CM | POA: Diagnosis not present

## 2020-10-08 NOTE — Progress Notes (Signed)
Subjective: Patient is here for ultrasound-guided intra-articular left glenohumeral injection.  Pain from DJD.  Objective:  Limited overhead and behind the back reach.  Procedure: Ultrasound guided injection is preferred based studies that show increased duration, increased effect, greater accuracy, decreased procedural pain, increased response rate, and decreased cost with ultrasound guided versus blind injection.   Verbal informed consent obtained.  Time-out conducted.  Noted no overlying erythema, induration, or other signs of local infection. Ultrasound-guided left glenohumeral injection: After sterile prep with Betadine, injected 4 cc 1% lidocaine without epinephrine and 40 mg methylprednisolone using a 22-gauge spinal needle, passing the needle from posterior approach into the glenohumeral joint.  Injectate seen filling joint capsule.  Return with CB as directed.

## 2020-10-15 ENCOUNTER — Other Ambulatory Visit: Payer: Self-pay | Admitting: Family Medicine

## 2020-10-15 ENCOUNTER — Other Ambulatory Visit: Payer: Self-pay | Admitting: Cardiology

## 2020-10-15 DIAGNOSIS — I480 Paroxysmal atrial fibrillation: Secondary | ICD-10-CM

## 2020-10-28 ENCOUNTER — Other Ambulatory Visit: Payer: Self-pay | Admitting: Family Medicine

## 2020-11-04 DIAGNOSIS — M25512 Pain in left shoulder: Secondary | ICD-10-CM | POA: Diagnosis not present

## 2020-11-09 DIAGNOSIS — M25512 Pain in left shoulder: Secondary | ICD-10-CM | POA: Diagnosis not present

## 2020-11-11 DIAGNOSIS — M25512 Pain in left shoulder: Secondary | ICD-10-CM | POA: Diagnosis not present

## 2020-11-16 DIAGNOSIS — M25512 Pain in left shoulder: Secondary | ICD-10-CM | POA: Diagnosis not present

## 2020-11-19 DIAGNOSIS — M25512 Pain in left shoulder: Secondary | ICD-10-CM | POA: Diagnosis not present

## 2020-11-26 DIAGNOSIS — M25512 Pain in left shoulder: Secondary | ICD-10-CM | POA: Diagnosis not present

## 2020-12-02 ENCOUNTER — Other Ambulatory Visit: Payer: Self-pay | Admitting: Family Medicine

## 2020-12-02 DIAGNOSIS — M25512 Pain in left shoulder: Secondary | ICD-10-CM | POA: Diagnosis not present

## 2020-12-26 ENCOUNTER — Other Ambulatory Visit: Payer: Self-pay | Admitting: Cardiology

## 2021-01-14 ENCOUNTER — Telehealth: Payer: Self-pay | Admitting: Cardiology

## 2021-01-14 NOTE — Telephone Encounter (Signed)
Spoke with pt, aware of dr jordan's recommendations. 

## 2021-01-14 NOTE — Telephone Encounter (Signed)
Patient c/o Palpitations:  High priority if patient c/o lightheadedness, shortness of breath, or chest pain  1) How long have you had palpitations/irregular HR/ Afib? Are you having the symptoms now?  Patient states she woke up in afib this morning Currently still in afib  2) Are you currently experiencing lightheadedness, SOB or CP? No  3) Do you have a history of afib (atrial fibrillation) or irregular heart rhythm? Yes   4) Have you checked your BP or HR? (document readings if available):  01/14/21: BP- 147/140  HR- 145   5) Are you experiencing any other symptoms?  Nausea   STAT if HR is under 50 or over 120 (normal HR is 60-100 beats per minute)  1) What is your heart rate? 145  2) Do you have a log of your heart rate readings (document readings)? No   3) Do you have any other symptoms? Nausea

## 2021-01-14 NOTE — Telephone Encounter (Signed)
Spoke with pt, she has not felt well the last couple days and today she decided to check her bp and pulse and now she feels she maybe in atrial fib. She is not aware of being out of rhythm without check her pulse with her bp machine. Her last episode by her report was in feb. She reports it usually happens in the evening, she will go to bed and wake up fine. She has took her metoprolol last night and her rythmol this morning. She is having nausea, no chest pain or SOB. She has a follow up appointment next week with dr Martin[place]. Patient instructed to take another metoprolol now and I will forward this to dr Martinique to see if he recommends anything else. Pt agreed with this plan.

## 2021-01-14 NOTE — Telephone Encounter (Signed)
Agree. Hopefully she will convert as this is what her prior history would suggest. She can double up on her metoprolol if HR remains high. She is on maximal dose of Rhythmol. Will see next week  Sinjin Amero Martinique MD, [facility]

## 2021-01-16 NOTE — Progress Notes (Signed)
History of Present Illness: [name] is seen today to followup of atrial fibrillation and dyspnea. She has a history of paroxysmal atrial fibrillation managed with Rhythmol and metoprolol. She is on Eliquis for anticoagulation.  In October 2015 she complained of increased palpitations and her Rhythmol dose was increased with good response. She complained of increased dyspnea and had a normal stress Myoview. Echo showed normal LV function with mild MR and mild Pulmonary HTN.   In July 2020 she underwent left TKR and in November she had right TKR.    On follow up today she had an episode of Afib on March 10. Lasted about 4 hours. Took extra metoprolol and it went away. None since then. This was the first it happened in the morning. Usually occurs in the evening. Has episodes about every 1-2 months. BP at home typically 154-008 systolic at home. She has gained weight.  Past Medical History:  Diagnosis Date   Acute upper respiratory infections of unspecified site    Arthritis    Carotid bruit    Carotid disease, bilateral ([place]) 01/31/2012   Mild noted on US carotid   Cataracts, both eyes    Chronic insomnia    Diverticulosis    pt denies   Dysrhythmia    a fib   Fatty liver    GERD (gastroesophageal reflux disease)    Heart murmur    Has been told by an MD.   Hiatal hernia    Denies   History of colon polyps    Noted on Colonscopy   History of gallstones    Hyperlipidemia    Hypertension    Insomnia, controlled 01/01/2014   Internal hemorrhoids    Macular pucker, left eye    surgery planned 01-14-15   Menopause    noticed in early 50's   MR (mitral regurgitation) 09/10/2014   Mild, Noted on ECHO    Other acute sinusitis    Other malaise and fatigue    Paroxysmal atrial fibrillation ([place])    on meds   Pneumonia    as a small child   PONV (postoperative nausea and vomiting)    Pulmonary hypertension ([place]) 09/10/2014   Mild, Noted on ECHO    Pure hypercholesterolemia    Restless leg syndrome    Routine general medical examination at a health care facility    Screening for lipoid disorders    Sleep apnea    no c-pap   Special screening for malignant neoplasms, colon    Uterine fibroid 09/19/1999   Noted on pelvis ultrasound    Current Outpatient Medications  Medication Sig Dispense Refill   acetaminophen (TYLENOL) 500 MG tablet Take 500 mg by mouth every 6 (six) hours as needed for moderate pain or headache.     ELIQUIS 5 MG TABS tablet TAKE 1 TABLET BY MOUTH TWICE A DAY 180 tablet 1   losartan (COZAAR) 100 MG tablet TAKE 1 TABLET BY MOUTH EVERY DAY 90 tablet 3   metoprolol succinate (TOPROL-XL) 50 MG 24 hr tablet TAKE 1 TABLET BY MOUTH EVERY DAY 90 tablet 3   propafenone (RYTHMOL) 300 MG tablet Take 1 tablet (300 mg total) by mouth 2 (two) times daily. 180 tablet 3   zolpidem (AMBIEN) 5 MG tablet Take 1 tablet (5 mg total) by mouth at bedtime. 90 tablet 1   amLODipine (NORVASC) 2.5 MG tablet Take 1 tablet (2.5 mg total) by mouth daily. 90 tablet 3   No current facility-administered medications for this  visit.    Allergies: Allergies  Allergen Reactions   Codeine Nausea And Vomiting   Other Nausea And Vomiting    general anesthesia   Oxycodone Nausea And Vomiting   Review of systems: As noted in history of present illness. All other review of systems are reviewed and are negative.  Vital Signs: BP (!) 150/77    Pulse (!) 57    Ht 5\' 4"  (1.626 m)    Wt 194 lb (88 kg)    SpO2 97%    BMI 33.30 kg/m   PHYSICAL EXAM: GENERAL:  Well appearing WF in NAD HEENT:  PERRL, EOMI, sclera are clear. Oropharynx is clear. NECK:  No jugular venous distention, carotid upstroke brisk and symmetric, no bruits, no thyromegaly or adenopathy LUNGS:  Clear to auscultation bilaterally CHEST:  Unremarkable HEART:  RRR,  PMI not displaced or sustained,S1 and S2 within normal limits, no S3, no S4: no clicks, no rubs, no  murmurs ABD:  Soft, nontender. BS +, no masses or bruits. No hepatomegaly, no splenomegaly EXT:  2 + pulses throughout, no edema, no cyanosis no clubbing SKIN:  Warm and dry.  No rashes NEURO:  Alert and oriented x 3. Cranial nerves II through XII intact. PSYCH:  Cognitively intact      Laboratory data:  Lab Results  Component Value Date   WBC 5.9 07/22/2020   HGB 13.3 07/22/2020   HCT 38.8 07/22/2020   PLT 207 07/22/2020   GLUCOSE 116 (H) 07/22/2020   CHOL 187 12/25/2018   TRIG 107 12/25/2018   HDL 61 12/25/2018   LDLDIRECT 137.0 08/10/2010   LDLCALC 105 (H) 12/25/2018   ALT 13 07/22/2020   AST 16 07/22/2020   NA 138 07/22/2020   K 5.1 07/22/2020   CL 105 07/22/2020   CREATININE 1.16 (H) 07/22/2020   BUN 28 (H) 07/22/2020   CO2 22 07/22/2020   TSH 1.190 12/25/2018   INR 1.2 09/20/2019   Ecg today shows NSR rate 57. Normal. I have personally reviewed and interpreted this study.  ASSESSMENT AND PLAN:  1. Paroxysmal atrial fibrillation. This is pretty well controlled on  Rythmol. She is on chronic anticoagulation with Eliquis.   Continue current therapy. Avoid use of NSAIDs. If she were to have more frequent Afib could consider alternative AAD therapy or ablation. Needs to focus on weight loss.  2. Hypertension. Blood pressure is high. Will resume amlodipine 2.5 mg daily  3. CKD. Last BMET showed increase in creatinine to 1.16. will repeat today. Focus on BP control and good hydration.  Follow up in 6 months.

## 2021-01-20 ENCOUNTER — Encounter: Payer: Self-pay | Admitting: Family Medicine

## 2021-01-20 ENCOUNTER — Other Ambulatory Visit: Payer: Self-pay

## 2021-01-20 ENCOUNTER — Encounter: Payer: Self-pay | Admitting: Cardiology

## 2021-01-20 ENCOUNTER — Ambulatory Visit (INDEPENDENT_AMBULATORY_CARE_PROVIDER_SITE_OTHER): Payer: Medicare HMO | Admitting: Family Medicine

## 2021-01-20 ENCOUNTER — Ambulatory Visit (INDEPENDENT_AMBULATORY_CARE_PROVIDER_SITE_OTHER): Payer: Medicare HMO | Admitting: Cardiology

## 2021-01-20 VITALS — BP 150/77 | HR 57 | Ht 64.0 in | Wt 194.0 lb

## 2021-01-20 VITALS — BP 168/77 | HR 57

## 2021-01-20 DIAGNOSIS — R635 Abnormal weight gain: Secondary | ICD-10-CM | POA: Diagnosis not present

## 2021-01-20 DIAGNOSIS — I480 Paroxysmal atrial fibrillation: Secondary | ICD-10-CM

## 2021-01-20 DIAGNOSIS — I10 Essential (primary) hypertension: Secondary | ICD-10-CM | POA: Diagnosis not present

## 2021-01-20 DIAGNOSIS — F5104 Psychophysiologic insomnia: Secondary | ICD-10-CM | POA: Diagnosis not present

## 2021-01-20 LAB — BASIC METABOLIC PANEL
BUN/Creatinine Ratio: 17 (ref 12–28)
BUN: 16 mg/dL (ref 8–27)
CO2: 23 mmol/L (ref 20–29)
Calcium: 9.2 mg/dL (ref 8.7–10.3)
Chloride: 101 mmol/L (ref 96–106)
Creatinine, Ser: 0.94 mg/dL (ref 0.57–1.00)
Glucose: 112 mg/dL — ABNORMAL HIGH (ref 65–99)
Potassium: 4.3 mmol/L (ref 3.5–5.2)
Sodium: 140 mmol/L (ref 134–144)
eGFR: 63 mL/min/{1.73_m2} (ref >59)

## 2021-01-20 MED ORDER — ZOLPIDEM TARTRATE 5 MG PO TABS: 5.0000 mg | ORAL_TABLET | Freq: Every day | ORAL | 1 refills | Status: DC

## 2021-01-20 MED ORDER — AMLODIPINE BESYLATE 2.5 MG PO TABS
2.5000 mg | ORAL_TABLET | Freq: Every day | ORAL | 3 refills | Status: DC
Start: 2021-01-20 — End: 2022-02-24

## 2021-01-20 NOTE — Patient Instructions (Addendum)
Below is our plan:  We will continue Ambien 2.5mg  daily. We will refer you to Dr Leafy Ro for weight management   Please make sure you are staying well hydrated. I recommend 50-60 ounces daily. Well balanced diet and regular exercise encouraged. Consistent sleep schedule with 6-8 hours recommended.   Please continue follow up with care team as directed.   Follow up with [place] in 6 months  You may receive a survey regarding today's visit. I encourage you to leave honest feed back as I do use this information to improve patient care. Thank you for seeing me today!     Quality Sleep Information, Adult Quality sleep is important for your mental and physical health. It also improves your quality of life. Quality sleep means you:  Are asleep for most of the time you are in bed.  Fall asleep within 30 minutes.  Wake up no more than once a night.  Are awake for no longer than 20 minutes if you do wake up during the night. Most adults need 7-8 hours of quality sleep each night. How can poor sleep affect me? If you do not get enough quality sleep, you may have:  Mood swings.  Daytime sleepiness.  Confusion.  Decreased reaction time.  Sleep disorders, such as insomnia and sleep apnea.  Difficulty with: ? Solving problems. ? Coping with stress. ? Paying attention. These issues may affect your performance and productivity at work, school, and at home. Lack of sleep may also put you at higher risk for accidents, suicide, and risky behaviors. If you do not get quality sleep you may also be at higher risk for several health problems, including:  Infections.  Type 2 diabetes.  Heart disease.  High blood pressure.  Obesity.  Worsening of long-term conditions, like arthritis, kidney disease, depression, Parkinson's disease, and epilepsy. What actions can I take to get more quality sleep?  Stick to a sleep schedule. Go to sleep and wake up at about the same time each day. Do not  try to sleep less on weekdays and make up for lost sleep on weekends. This does not work.  Try to get about 30 minutes of exercise on most days. Do not exercise 2-3 hours before going to bed.  Limit naps during the day to 30 minutes or less.  Do not use any products that contain nicotine or tobacco, such as cigarettes or e-cigarettes. If you need help quitting, ask your health care provider.  Do not drink caffeinated beverages for at least 8 hours before going to bed. Coffee, tea, and some sodas contain caffeine.  Do not drink alcohol close to bedtime.  Do not eat large meals close to bedtime.  Do not take naps in the late afternoon.  Try to get at least 30 minutes of sunlight every day. Morning sunlight is best.  Make time to relax before bed. Reading, listening to music, or taking a hot bath promotes quality sleep.  Make your bedroom a place that promotes quality sleep. Keep your bedroom dark, quiet, and at a comfortable room temperature. Make sure your bed is comfortable. Take out sleep distractions like TV, a computer, smartphone, and bright lights.  If you are lying awake in bed for longer than 20 minutes, get up and do a relaxing activity until you feel sleepy.  Work with your health care provider to treat medical conditions that may affect sleeping, such as: ? Nasal obstruction. ? Snoring. ? Sleep apnea and other sleep disorders.  Talk  to your health care provider if you think any of your prescription medicines may cause you to have difficulty falling or staying asleep.  If you have sleep problems, talk with a sleep consultant. If you think you have a sleep disorder, talk with your health care provider about getting evaluated by a specialist.      Where to find more information  [place] website: https://sleepfoundation.org  National Heart, Lung, and [place] ([place]): http://www.saunders.info/.pdf  Centers for  Disease Control and Prevention (CDC): LearningDermatology.pl Contact a health care provider if you:  Have trouble getting to sleep or staying asleep.  Often wake up very early in the morning and cannot get back to sleep.  Have daytime sleepiness.  Have daytime sleep attacks of suddenly falling asleep and sudden muscle weakness (narcolepsy).  Have a tingling sensation in your legs with a strong urge to move your legs (restless legs syndrome).  Stop breathing briefly during sleep (sleep apnea).  Think you have a sleep disorder or are taking a medicine that is affecting your quality of sleep. Summary  Most adults need 7-8 hours of quality sleep each night.  Getting enough quality sleep is an important part of health and well-being.  Make your bedroom a place that promotes quality sleep and avoid things that may cause you to have poor sleep, such as alcohol, caffeine, smoking, and large meals.  Talk to your health care provider if you have trouble falling asleep or staying asleep. This information is not intended to replace advice given to you by your health care provider. Make sure you discuss any questions you have with your health care provider. Document Revised: 01/31/2018 Document Reviewed: 01/31/2018 Elsevier Patient Education  2021 [place].   Insomnia Insomnia is a sleep disorder that makes it difficult to fall asleep or stay asleep. Insomnia can cause fatigue, low energy, difficulty concentrating, mood swings, and poor performance at work or school. There are three different ways to classify insomnia:  Difficulty falling asleep.  Difficulty staying asleep.  Waking up too early in the morning. Any type of insomnia can be long-term (chronic) or short-term (acute). Both are common. Short-term insomnia usually lasts for three months or less. Chronic insomnia occurs at least three times a week for longer than three months. What are the causes? Insomnia may be  caused by another condition, situation, or substance, such as:  Anxiety.  Certain medicines.  Gastroesophageal reflux disease (GERD) or other gastrointestinal conditions.  Asthma or other breathing conditions.  Restless legs syndrome, sleep apnea, or other sleep disorders.  Chronic pain.  Menopause.  Stroke.  Abuse of alcohol, tobacco, or illegal drugs.  Mental health conditions, such as depression.  Caffeine.  Neurological disorders, such as Alzheimer's disease.  An overactive thyroid (hyperthyroidism). Sometimes, the cause of insomnia may not be known. What increases the risk? Risk factors for insomnia include:  Gender. Women are affected more often than men.  Age. Insomnia is more common as you get older.  Stress.  Lack of exercise.  Irregular work schedule or working night shifts.  Traveling between different time zones.  Certain medical and mental health conditions. What are the signs or symptoms? If you have insomnia, the main symptom is having trouble falling asleep or having trouble staying asleep. This may lead to other symptoms, such as:  Feeling fatigued or having low energy.  Feeling nervous about going to sleep.  Not feeling rested in the morning.  Having trouble concentrating.  Feeling irritable, anxious, or  depressed. How is this diagnosed? This condition may be diagnosed based on:  Your symptoms and medical history. Your health care provider may ask about: ? Your sleep habits. ? Any medical conditions you have. ? Your mental health.  A physical exam. How is this treated? Treatment for insomnia depends on the cause. Treatment may focus on treating an underlying condition that is causing insomnia. Treatment may also include:  Medicines to help you sleep.  Counseling or therapy.  Lifestyle adjustments to help you sleep better. Follow these instructions at home: Eating and drinking  Limit or avoid alcohol, caffeinated beverages,  and cigarettes, especially close to bedtime. These can disrupt your sleep.  Do not eat a large meal or eat spicy foods right before bedtime. This can lead to digestive discomfort that can make it hard for you to sleep.   Sleep habits  Keep a sleep diary to help you and your health care provider figure out what could be causing your insomnia. Write down: ? When you sleep. ? When you wake up during the night. ? How well you sleep. ? How rested you feel the next day. ? Any side effects of medicines you are taking. ? What you eat and drink.  Make your bedroom a dark, comfortable place where it is easy to fall asleep. ? Put up shades or blackout curtains to block light from outside. ? Use a white noise machine to block noise. ? Keep the temperature cool.  Limit screen use before bedtime. This includes: ? Watching TV. ? Using your smartphone, tablet, or computer.  Stick to a routine that includes going to bed and waking up at the same times every day and night. This can help you fall asleep faster. Consider making a quiet activity, such as reading, part of your nighttime routine.  Try to avoid taking naps during the day so that you sleep better at night.  Get out of bed if you are still awake after 15 minutes of trying to sleep. Keep the lights down, but try reading or doing a quiet activity. When you feel sleepy, go back to bed.   General instructions  Take over-the-counter and prescription medicines only as told by your health care provider.  Exercise regularly, as told by your health care provider. Avoid exercise starting several hours before bedtime.  Use relaxation techniques to manage stress. Ask your health care provider to suggest some techniques that may work well for you. These may include: ? Breathing exercises. ? Routines to release muscle tension. ? Visualizing peaceful scenes.  Make sure that you drive carefully. Avoid driving if you feel very sleepy.  Keep all  follow-up visits as told by your health care provider. This is important. Contact a health care provider if:  You are tired throughout the day.  You have trouble in your daily routine due to sleepiness.  You continue to have sleep problems, or your sleep problems get worse. Get help right away if:  You have serious thoughts about hurting yourself or someone else. If you ever feel like you may hurt yourself or others, or have thoughts about taking your own life, get help right away. You can go to your nearest emergency department or call:  Your local emergency services (911 in the [place]  A suicide crisis helpline, such as the [place] at 218-424-4919. This is open 24 hours a day. Summary  Insomnia is a sleep disorder that makes it difficult to fall asleep or stay asleep.  Insomnia can be long-term (chronic) or short-term (acute).  Treatment for insomnia depends on the cause. Treatment may focus on treating an underlying condition that is causing insomnia.  Keep a sleep diary to help you and your health care provider figure out what could be causing your insomnia. This information is not intended to replace advice given to you by your health care provider. Make sure you discuss any questions you have with your health care provider. Document Revised: 09/03/2020 Document Reviewed: 09/03/2020 Elsevier Patient Education  2021 [employer].

## 2021-01-20 NOTE — Patient Instructions (Signed)
Resume amlodipine 2.5 mg daily for blood pressure  We will check your kidney function today  Focus on weight loss.

## 2021-01-20 NOTE — Progress Notes (Addendum)
PATIENT: [name] DOB: Oct 09, 1946  REASON FOR VISIT: follow up HISTORY FROM: patient  Chief Complaint  Patient presents with  . Follow-up    RM 1 alone  Pt is well, insomnia has not improved, things are the same      HISTORY OF PRESENT ILLNESS:  01/20/21 ALL:  [name] returns today for follow up on chronic insomnia. She has not been able to wean Ambien. She continues 2.5mg  night. Occasionally she has to take another 2.5mg  to help get a few more hours of sleep. She has tried melatonin and valerian  But both were ineffective. She is getting about 6-7 hours of sleep every night. She lives alone. She does have a dog. She has neighbors that check in on her. She denies adverse effects.   Blood pressure is elevated, today. She continues metoprolol but no longer taking amlodipine. She has atrial fib. She had an event last week where she was in atrial fib and was advised to take an extra dose of metoprolol which seemed to help. She has follow up with cardiology today. She has noted elevated BP readings. She has also gained some weight following her last knee surgery. She is having a hard time getting the weight back off.   07/23/2020 ALL:  [name] is a 75 y.o. female here today for follow up for insomnia.  She has continued to wean Ambien.  She is now taking 2.5 mg every night at bedtime.  She has had more difficulty with insomnia since decreasing Ambien dose.  She is motivated to discontinue Ambien as this is no longer covered by her insurance.  She has tried melatonin in the past but is willing to try this medication again.  No obvious adverse effects.  She is feeling well otherwise.  She remains very active at home.  She denies any significant concerns of anxiety or depression. She  01/20/2020 ALL: [name] is a 75 y.o. female here today for follow up for insomnia. She continues Ambien 5mg  at bedtime. She feels that Ambien is the only medication that seems to help.  She does try to take less when she can. She is recovering from right knee replacement. She had a left knee replacement in July. She is recovering well. She does admit to forgetting her BP meds this morning. BP is usually 140's/80's.    HISTORY: (copied from Dr Dohmeier's note on 07/22/2019)  9-14- 2020, RV with Cyara H. Landgrebe, a 75 year old hearing impaired Caucasian female patient. [name] lost her husband and is now living by herself, she has not to her knowledge contracted any COVID or was exposed to. She has been followed here for years for obstructive sleep apnea with sleep hypoxemia and has also chronic insomnia. She is not daytime excessively sleepy endorsing the Epworth score at 1 point today the fatigue severity at 18 points and the geriatric depression score at 4 out of 15 points. Her hearing aids no longer serve her well and therefore we have switched to a face shield on my part to that she can read my lips.medicare no longer paid for her machine. No longer on CPAP. Pharnacy is now [facility]- she wants Ambien refilled, but understands 5 mg is her age limit. She underwent knee replacement. Post surgery, left knee- &-24-2020.,she used oxycodone and tramadol, but couldn't tolerate these, she lost appetit , was latently nauseated. She wants to use Ambien instead.    07-18-2018, Rv with [name] -a  75 year old female patient who has become her husbands caretaker.  She endorses today the fatigue severity scale at 29 points, and her Epworth sleepiness score at 2 points. The patient has suffered for a long time but is chronic insomnia, and underlying condition that has been treated with Ambien at 10 mg. And she continues to have a. Of wakefulness somewhere between 2 and 3 AM, this is also correlated to her Fitbit recording. Her husband continues to receive chemotherapy, she had been successful in losing some weight but has been stable, certainly stress related.    01-15-2018,  RV for this 75 year old caucasian married female with chronic insomnia- and who has learnt to live with it. She reports bedtime is around 11.30 pm, sleeping for about 7 hours total.  Her husband is on chemotherapy and tolerating it well, he takes naps in daytime, she doesn't. She has lost weight again, and has not gained back over the last 12 month. She reports her cardiologist is happy with her, too. Her endorsing the Epworth score at only 2 points, FSS 23,   Today 12/27/16: [name] is a 75 year old female with a history of chronic insomnia. She returns today for follow-up. At her last visit she was advised that she did not have to use the CPAP as it was not offering her much benefit. She states that she's been using Ambien 10 mg at bedtime. She states that this is the only medication that has offered her some benefit. She states that she still wakes up numerous times a night but she is able to go back to sleep. She reports on the rare occasion she will have a night that she cannot sleep at all. She was referred to Dr. Casimiro Needle but states that she never made an appointment. She states that her husband has had a lot of health issues and may being consumed with that for the last several months. She states that she understands that she has a lot of anxiety but she does not feel depressed. She returns today for an evaluation. And feel refreshed  We will follow HISTORY 01-20-2012 per Dr. Brett Fairy' notes: Emily Headings Wrightis a 48femaleand seen here in referral from Dr. Francella Solian chronic insomnia. The patient will be evaluated for organic reasons of insomnia.   The patient was last seen on 01-20-12 and has lost a lot of weight in the meantime. After trazodone and Remeron did fail to produce satisfying results for now she will sometimes take a Benadryl or Tylenol PM to assist her with her sleep initiation. She thinks nocturia was never the main problem. The patient states that she has lost a lot of  weight intentionally and that this has helped certainly some of her other health issues including hypertension. She was diagnosed with atrial fibrillation in the past has been on chronic anticoagulation. She reported that topiramate caused her to be drowsy and daytime when taken in the morning for that reason we will change the intake time 2 PM this may assist with her sleepiness as well as was her headaches. It has also helped her to lose weight.  At bedtime routines were reviewed : the patient was to bed around 11 PM rises at 6:30 AM depending on how much sleep she got.  She did have amnestic spells from after some nights when using Ambien- but she sleeps all much better on Ambien but on the other medications.  The onset of her insomnia was related to the development of menopause  but she only has really hot flashes now- she uses Ambien chronically for 2 decades.  Her bedroom is described as quiet, cool and dark she shares a bedroom with her husband, who is very sick.    Interval history , 01-13-15 CD, [name] is seen here following up on her sleep study from 01-29-14 the study showed a patient with normal heart rate some prolonged oxygen desaturation and very mild apnea. A problem for the patient was that she couldn't really sleep through the night she was 53 minutes awake during her sleep study. 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 Dr. Zenia Resides had originally been referred to the patient for insomnia evaluation. Based on the hypoxemia and the mix of a mild sleep apnea , she could be invited the patient to come in for a CPAP titration split-night study. The patient is beginning cataracts and she has just been seen by optometry, she has been insomnic again. Testing was suspected to reveal also retinal disease and she was referred to Dr. Zadie Rhine a retinal specialist. She has a macular pucker. Surgery is scheduled for tomorrow morning 01-14-15. She is in need of a refill for Ambien which he  uses to treat her chronic insomnia. In spite of this the last week had been difficult for her I think because of anxiety in anticipation of the surgery. Her fatigue severity score is up to 40 points and her fatigue her Epworth sleepiness score is normal at 2 points.  Her sleep is not restorative, still. She will have an overnight pulse-oximetry after her surgery is done. I will schedule this though [facility]. Rv with NP or me in May-June 2016.  06-04-15, [name] is here status post cataract ectomy bilaterally and surgery to the retina. As I had quoted in my last visit she underwent a sleep study on 3-20 5-15. She continued to complain of nonrestorative not refreshing sleep. She feels however that she has started to manage better with that and her fatigue is no longer as excessive. She endorsed the fatigue severity score of 31 points and the Epworth sleepiness score at 3 points. She's not likely to fall asleep unintended. She doesn't struggle with sleep attacks. In her geriatric depression score she endorsed only 1 point which is not indicative of suffering from clinical depression. She wears a fit bit, which records her pulse, she wore a ONO, and had 2 hours of low oxygen levels, she will need a referral to pulmonology for further evaluation or will proceed with a CPAP auto-titration, to see if correcting the mild apnea is an option. She needs to lose weight.   04-25-2016 [name] was last seen by mypractitioner Cecille Rubin, and referred for a new CPAP titration on 01/27/2016. She was again diagnosed with insomnia and a low sleep efficiency of only 72.5% CPAP was explored from 5 through 8 cm water. And an air-fit P 10 nasal pillow in extra small size was used. She still reports that she feels restless and uncomfortable just was having anything in her face or attached to her body. And of course the PLM arousals were not addressed by CPAP. She would like to sleep on her side but feels  compelled to sleep in supine position so that the mass does not get as long.  The compliance data since May 30 show 14 out of 15 days of use she used to machine over 4 hours for only 11 out of 15 days. A 74% compliance. The average user  time is 4 hours and 56 minutes. The set pressure was 8 cm water with 3 cm EPR and her residual AHI is 2.9 there is a significant reduction in her apnea index. However her baseline AHI was not that high in 2015 with an AHI of 9. I think given that the patient feels no improvement in her daytime alertness and actually more bothered by the machine and given that she has a mild apnea to begin with I would allow her to discontinue the use of CPAP she could change to a dental device is snoring bothers her, but she denies. She will concentrate on weight loss.   REVIEW OF SYSTEMS: Out of a complete 14 system review of symptoms, the patient complains only of the following symptoms, insomnia, right knee pain, weight gain and all other reviewed systems are negative.  ESS:4 FSS:43   ALLERGIES: Allergies  Allergen Reactions  . Codeine Nausea And Vomiting  . Other Nausea And Vomiting    general anesthesia  . Oxycodone Nausea And Vomiting    HOME MEDICATIONS: Outpatient Medications Prior to Visit  Medication Sig Dispense Refill  . acetaminophen (TYLENOL) 500 MG tablet Take 500 mg by mouth every 6 (six) hours as needed for moderate pain or headache.    Marland Kitchen ELIQUIS 5 MG TABS tablet TAKE 1 TABLET BY MOUTH TWICE A DAY 180 tablet 1  . losartan (COZAAR) 100 MG tablet TAKE 1 TABLET BY MOUTH EVERY DAY 90 tablet 3  . metoprolol succinate (TOPROL-XL) 50 MG 24 hr tablet TAKE 1 TABLET BY MOUTH EVERY DAY 90 tablet 3  . propafenone (RYTHMOL) 300 MG tablet Take 1 tablet (300 mg total) by mouth 2 (two) times daily. 180 tablet 3  . zolpidem (AMBIEN) 5 MG tablet Take 1 tablet (5 mg total) by mouth at bedtime. 90 tablet 1  . amLODipine (NORVASC) 2.5 MG tablet TAKE 1 TABLET BY MOUTH EVERY  DAY 30 tablet 3  . Polyethyl Glycol-Propyl Glycol (SYSTANE OP) Place 1 drop into both eyes daily as needed (dry eyes).    . prednisoLONE acetate (PRED FORTE) 1 % ophthalmic suspension Place 1 drop into the right eye 4 (four) times daily. (Patient not taking: Reported on 09/29/2020) 5 mL 0   No facility-administered medications prior to visit.    PAST MEDICAL HISTORY: Past Medical History:  Diagnosis Date  . Acute upper respiratory infections of unspecified site   . Arthritis   . Carotid bruit   . Carotid disease, bilateral ([place]) 01/31/2012   Mild noted on US carotid  . Cataracts, both eyes   . Chronic insomnia   . Diverticulosis    pt denies  . Dysrhythmia    a fib  . Fatty liver   . GERD (gastroesophageal reflux disease)   . Heart murmur    Has been told by an MD.  . Hiatal hernia    Denies  . History of colon polyps    Noted on Colonscopy  . History of gallstones   . Hyperlipidemia   . Hypertension   . Insomnia, controlled 01/01/2014  . Internal hemorrhoids   . Macular pucker, left eye    surgery planned 01-14-15  . Menopause    noticed in early 61's  . MR (mitral regurgitation) 09/10/2014   Mild, Noted on ECHO   . Other acute sinusitis   . Other malaise and fatigue   . Paroxysmal atrial fibrillation ([facility])    on meds  . Pneumonia    as a small child  .  PONV (postoperative nausea and vomiting)   . Pulmonary hypertension ([place]) 09/10/2014   Mild, Noted on ECHO  . Pure hypercholesterolemia   . Restless leg syndrome   . Routine general medical examination at a health care facility   . Screening for lipoid disorders   . Sleep apnea    no c-pap  . Special screening for malignant neoplasms, colon   . Uterine fibroid 09/19/1999   Noted on pelvis ultrasound    PAST SURGICAL HISTORY: Past Surgical History:  Procedure Laterality Date  . cardiolyte neg  6/06  . cardiovascular stress MRI  12/20/06   afib  . CHOLECYSTECTOMY    . COLONOSCOPY    . EYE SURGERY      cataract   Toric implant bil eyes  . EYE SURGERY Right 07/29/2020   Mem Peel and Vit - Dr. Zadie Rhine  . KNEE ARTHROSCOPY     left  . ORIF- L distal radius  9/07   left wrist  . PARS PLANA VITRECTOMY W/ REPAIR OF MACULAR HOLE     left eye  . torn meniscus     bil knees    . TOTAL KNEE ARTHROPLASTY Left 05/31/2019   Procedure: LEFT TOTAL KNEE ARTHROPLASTY;  Surgeon: Mcarthur Rossetti, MD;  Location: [facility];  Service: Orthopedics;  Laterality: Left;  . TOTAL KNEE ARTHROPLASTY Right 09/20/2019   Procedure: RIGHT TOTAL KNEE ARTHROPLASTY;  Surgeon: Mcarthur Rossetti, MD;  Location: [facility];  Service: Orthopedics;  Laterality: Right;  . TUBAL LIGATION    . UPPER GI ENDOSCOPY      FAMILY HISTORY: Family History  Problem Relation Age of Onset  . Breast cancer Mother   . Heart disease Father   . Colon cancer Cousin 73  . Diabetes Other        cousins    SOCIAL HISTORY: Social History   Socioeconomic History  . Marital status: Widowed    Spouse name: Sterling Big  . Number of children: 3  . Years of education: College  . Highest education level: Not on file  Occupational History  . Occupation: Product manager: [employer]  Tobacco Use  . Smoking status: Never Smoker  . Smokeless tobacco: Never Used  Vaping Use  . Vaping Use: Never used  Substance and Sexual Activity  . Alcohol use: Not Currently    Comment: rarely  . Drug use: No  . Sexual activity: Never  Other Topics Concern  . Not on file  Social History Narrative   Patient is widowed. Patient has three adult children.   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    Patient is right-handed.   Patient drinks two cups of caffeine daily.   Patient has a college education.   Social Determinants of Health   Financial Resource Strain: Not on file  Food Insecurity: Not on file  Transportation Needs: Not on file  Physical Activity: Not on file  Stress: Not on file   Social Connections: Not on file  Intimate Partner Violence: Not on file      PHYSICAL EXAM  Vitals:   01/20/21 0918  BP: (!) 168/77  Pulse: (!) 57   There is no height or weight on file to calculate BMI.  Generalized: Well developed, in no acute distress  Cardiology: normal rate and rhythm, no murmur noted Respiratory: clear to auscultation bilaterally  Neurological examination  Mentation: Alert oriented to time, place, history taking. Follows all  commands speech and language fluent Cranial nerve II-XII: Pupils were equal round reactive to light. Extraocular movements were full, visual field were full  Motor: The motor testing reveals 5 over 5 strength of all 4 extremities. Good symmetric motor tone is noted throughout.  Gait and station: Gait is normal.    DIAGNOSTIC DATA (LABS, IMAGING, TESTING) - I reviewed patient records, labs, notes, testing and imaging myself where available.  MMSE - Mini Mental State Exam 01/20/2016  Orientation to time 5  Orientation to Place 5  Registration 3  Attention/ Calculation 5  Recall 3  Language- name 2 objects 0  Language- repeat 1  Language- follow 3 step command 3  Language- read & follow direction 1  Write a sentence 0  Copy design 0  Total score 26     Lab Results  Component Value Date   WBC 5.9 07/22/2020   HGB 13.3 07/22/2020   HCT 38.8 07/22/2020   MCV 90 07/22/2020   PLT 207 07/22/2020      Component Value Date/Time   NA 138 07/22/2020 1028   K 5.1 07/22/2020 1028   CL 105 07/22/2020 1028   CO2 22 07/22/2020 1028   GLUCOSE 116 (H) 07/22/2020 1028   GLUCOSE 129 (H) 09/21/2019 0301   BUN 28 (H) 07/22/2020 1028   CREATININE 1.16 (H) 07/22/2020 1028   CREATININE 1.09 (H) 10/18/2016 1003   CALCIUM 8.8 07/22/2020 1028   PROT 6.7 07/22/2020 1028   ALBUMIN 3.9 07/22/2020 1028   AST 16 07/22/2020 1028   ALT 13 07/22/2020 1028   ALKPHOS 101 07/22/2020 1028   BILITOT 0.5 07/22/2020 1028   GFRNONAA 46 (L)  07/22/2020 1028   GFRAA 53 (L) 07/22/2020 1028   Lab Results  Component Value Date   CHOL 187 12/25/2018   HDL 61 12/25/2018   LDLCALC 105 (H) 12/25/2018   LDLDIRECT 137.0 08/10/2010   TRIG 107 12/25/2018   CHOLHDL 3.1 12/25/2018   No results found for: HGBA1C No results found for: VITAMINB12 Lab Results  Component Value Date   TSH 1.190 12/25/2018       ASSESSMENT AND PLAN 75 y.o. year old female  has a past medical history of Acute upper respiratory infections of unspecified site, Arthritis, Carotid bruit, Carotid disease, bilateral ([place]) (01/31/2012), Cataracts, both eyes, Chronic insomnia, Diverticulosis, Dysrhythmia, Fatty liver, GERD (gastroesophageal reflux disease), Heart murmur, Hiatal hernia, History of colon polyps, History of gallstones, Hyperlipidemia, Hypertension, Insomnia, controlled (01/01/2014), Internal hemorrhoids, Macular pucker, left eye, Menopause, MR (mitral regurgitation) (09/10/2014), Other acute sinusitis, Other malaise and fatigue, Paroxysmal atrial fibrillation ([place]), Pneumonia, PONV (postoperative nausea and vomiting), Pulmonary hypertension ([place]) (09/10/2014), Pure hypercholesterolemia, Restless leg syndrome, Routine general medical examination at a health care facility, Screening for lipoid disorders, Sleep apnea, Special screening for malignant neoplasms, colon, and Uterine fibroid (09/19/1999). here with significant    ICD-10-CM   1. Chronic insomnia  F51.04      Jaleena continues to have difficulty with insomnia. We will continue Ambien up to 5mg  every night. Safety concerns discussed with using this medication. She will follow up with cardiology today to discuss elevated BP readings. She is asymptomatic. We will refer her to [facility] to assist in weight management.  Healthy lifestyle duration, well-balanced diet and regular exercise encouraged.  She will continue to follow-up closely with her primary care provider.  She will  follow-up in 6 months, sooner if needed.  She verbalizes understanding and agreement with this plan.  No  orders of the defined types were placed in this encounter.    No orders of the defined types were placed in this encounter.     I spent 15 minutes with the patient. 50% of this time was spent counseling and educating patient on plan of care and medications.    Debbora Presto, FNP-C 01/20/2021, 9:41 AM [facility] [address] [place], [place] [address] [phone]

## 2021-03-25 ENCOUNTER — Other Ambulatory Visit: Payer: Self-pay | Admitting: Cardiology

## 2021-03-25 NOTE — Telephone Encounter (Signed)
30f, 88kg, scr 0.94 01/20/21, lovw/jordan 01/20/21

## 2021-03-29 ENCOUNTER — Encounter (INDEPENDENT_AMBULATORY_CARE_PROVIDER_SITE_OTHER): Payer: Self-pay | Admitting: Ophthalmology

## 2021-03-29 ENCOUNTER — Other Ambulatory Visit: Payer: Self-pay

## 2021-03-29 ENCOUNTER — Ambulatory Visit (INDEPENDENT_AMBULATORY_CARE_PROVIDER_SITE_OTHER): Payer: Medicare HMO | Admitting: Ophthalmology

## 2021-03-29 DIAGNOSIS — H35371 Puckering of macula, right eye: Secondary | ICD-10-CM | POA: Diagnosis not present

## 2021-03-29 DIAGNOSIS — Z9889 Other specified postprocedural states: Secondary | ICD-10-CM | POA: Diagnosis not present

## 2021-03-29 NOTE — Assessment & Plan Note (Signed)
History of membrane peel for epiretinal membrane 2016 stable

## 2021-03-29 NOTE — Progress Notes (Signed)
03/29/2021     CHIEF COMPLAINT Patient presents for Retina Follow Up (6 month fu ou and oct/Pt states, "I feel like my va is getting a little worse. I wake up feeling like there is something in my OD a lot. I also have a little distortion when I look in the distance. "/)   HISTORY OF PRESENT ILLNESS: [name] is a 75 y.o. female who presents to the clinic today for:   HPI    Retina Follow Up    Diagnosis: Other   Laterality: right eye   Onset: 6 months ago   Severity: mild   Duration: 6 months   Course: gradually worsening   Comments: 6 month fu ou and oct Pt states, "I feel like my va is getting a little worse. I wake up feeling like there is something in my OD a lot. I also have a little distortion when I look in the distance. "           Comments    Most recent status post vitrectomy membrane peel right eye September 2021 with continued improvement in vision yet slight distortion remains.  OS status post vitrectomy membrane peel 2016       Last edited by Hurman Horn, MD on 03/29/2021  1:57 PM. (History)      Referring physician: No referring provider defined for this encounter.  HISTORICAL INFORMATION:   Selected notes from the MEDICAL RECORD NUMBER       CURRENT MEDICATIONS: No current outpatient medications on file. (Ophthalmic Drugs)   No current facility-administered medications for this visit. (Ophthalmic Drugs)   Current Outpatient Medications (Other)  Medication Sig  . acetaminophen (TYLENOL) 500 MG tablet Take 500 mg by mouth every 6 (six) hours as needed for moderate pain or headache.  Marland Kitchen amLODipine (NORVASC) 2.5 MG tablet Take 1 tablet (2.5 mg total) by mouth daily.  Marland Kitchen ELIQUIS 5 MG TABS tablet TAKE 1 TABLET BY MOUTH TWICE A DAY  . losartan (COZAAR) 100 MG tablet TAKE 1 TABLET BY MOUTH EVERY DAY  . metoprolol succinate (TOPROL-XL) 50 MG 24 hr tablet TAKE 1 TABLET BY MOUTH EVERY DAY  . propafenone (RYTHMOL) 300 MG tablet Take 1 tablet (300  mg total) by mouth 2 (two) times daily.  Marland Kitchen zolpidem (AMBIEN) 5 MG tablet Take 1 tablet (5 mg total) by mouth at bedtime.   No current facility-administered medications for this visit. (Other)      REVIEW OF SYSTEMS:    ALLERGIES Allergies  Allergen Reactions  . Codeine Nausea And Vomiting  . Other Nausea And Vomiting    general anesthesia  . Oxycodone Nausea And Vomiting    PAST MEDICAL HISTORY Past Medical History:  Diagnosis Date  . Acute upper respiratory infections of unspecified site   . Arthritis   . Carotid bruit   . Carotid disease, bilateral ([place]) 01/31/2012   Mild noted on US carotid  . Cataracts, both eyes   . Chronic insomnia   . Diverticulosis    pt denies  . Dysrhythmia    a fib  . Fatty liver   . GERD (gastroesophageal reflux disease)   . Heart murmur    Has been told by an MD.  . Hiatal hernia    Denies  . History of colon polyps    Noted on Colonscopy  . History of gallstones   . Hyperlipidemia   . Hypertension   . Insomnia, controlled 01/01/2014  . Internal hemorrhoids   .  Macular pucker, left eye    surgery planned 01-14-15  . Menopause    noticed in early 65's  . MR (mitral regurgitation) 09/10/2014   Mild, Noted on ECHO   . Other acute sinusitis   . Other malaise and fatigue   . Paroxysmal atrial fibrillation ([facility])    on meds  . Pneumonia    as a small child  . PONV (postoperative nausea and vomiting)   . Pulmonary hypertension ([place]) 09/10/2014   Mild, Noted on ECHO  . Pure hypercholesterolemia   . Restless leg syndrome   . Routine general medical examination at a health care facility   . Screening for lipoid disorders   . Sleep apnea    no c-pap  . Special screening for malignant neoplasms, colon   . Uterine fibroid 09/19/1999   Noted on pelvis ultrasound   Past Surgical History:  Procedure Laterality Date  . cardiolyte neg  6/06  . cardiovascular stress MRI  12/20/06   afib  . CHOLECYSTECTOMY    . COLONOSCOPY    . EYE  SURGERY     cataract   Toric implant bil eyes  . EYE SURGERY Right 07/29/2020   Mem Peel and Vit - Dr. Zadie Rhine  . KNEE ARTHROSCOPY     left  . ORIF- L distal radius  9/07   left wrist  . PARS PLANA VITRECTOMY W/ REPAIR OF MACULAR HOLE     left eye  . torn meniscus     bil knees    . TOTAL KNEE ARTHROPLASTY Left 05/31/2019   Procedure: LEFT TOTAL KNEE ARTHROPLASTY;  Surgeon: Mcarthur Rossetti, MD;  Location: [facility];  Service: Orthopedics;  Laterality: Left;  . TOTAL KNEE ARTHROPLASTY Right 09/20/2019   Procedure: RIGHT TOTAL KNEE ARTHROPLASTY;  Surgeon: Mcarthur Rossetti, MD;  Location: [facility];  Service: Orthopedics;  Laterality: Right;  . TUBAL LIGATION    . UPPER GI ENDOSCOPY      FAMILY HISTORY Family History  Problem Relation Age of Onset  . Breast cancer Mother   . Heart disease Father   . Colon cancer Cousin 76  . Diabetes Other        cousins    SOCIAL HISTORY Social History   Tobacco Use  . Smoking status: Never Smoker  . Smokeless tobacco: Never Used  Vaping Use  . Vaping Use: Never used  Substance Use Topics  . Alcohol use: Not Currently    Comment: rarely  . Drug use: No         OPHTHALMIC EXAM: Base Eye Exam    Visual Acuity (ETDRS)      Right Left   Dist cc 20/20 -2 20/25 -1   Correction: Glasses       Tonometry (Tonopen, 1:20 PM)      Right Left   Pressure 14 14       Pupils      Pupils Dark Light Shape React APD   Right PERRL 6 6 Round Minimal None   Left PERRL 6 6 Round Minimal None       Visual Fields (Counting fingers)      Left Right    Full Full       Extraocular Movement      Right Left    Full Full       Neuro/Psych    Oriented x3: Yes   Mood/Affect: Normal       Dilation    Both eyes: 1.0% Mydriacyl, 2.5% Phenylephrine @  1:20 PM        Slit Lamp and Fundus Exam    External Exam      Right Left   External Normal Normal       Slit Lamp Exam      Right Left   Lids/Lashes Normal Normal    Conjunctiva/Sclera White and quiet White and quiet   Cornea Clear Clear   Anterior Chamber Deep and quiet Deep and quiet   Iris Round and reactive Round and reactive   Lens Posterior chamber intraocular lens, 1+ Posterior capsular opacification Posterior chamber intraocular lens   Anterior Vitreous Normal Normal       Fundus Exam      Right Left   Posterior Vitreous Clear, vitrectomized Vitrectomized   Disc Normal Normal   C/D Ratio 0.6 0.3   Macula No topographic distortion centrally Normal   Vessels Normal Normal   Periphery Normal Normal          IMAGING AND PROCEDURES  Imaging and Procedures for 03/29/21  OCT, Retina - OU - Both Eyes       Right Eye Quality was good. Scan locations included subfoveal. Central Foveal Thickness: 370. Progression has improved. Findings include abnormal foveal contour.   Left Eye Quality was good. Scan locations included subfoveal. Central Foveal Thickness: 393. Progression has been stable. Findings include abnormal foveal contour.   Notes Much less topographic distortion and improving macular thickening of the right eye now 8 months status post Vitrectomy and membrane peel right eye for epiretinal membrane  And less thickening now some 6 years post vitrectomy left eye                ASSESSMENT/PLAN:  Macular pucker, right eye OD, macular thickening continues to slowly resolve and improved vision after vitrectomy 07/29/2020  History of vitrectomy History of membrane peel for epiretinal membrane 2016 stable      ICD-10-CM   1. Macular pucker, right eye  H35.371 OCT, Retina - OU - Both Eyes  2. History of vitrectomy  Z98.890     1.  OD, looks great with improved acuity and improved symptoms overall.  Much less retinal thickening on OCT examination some 8 months post vitrectomy membrane peel  2.  3.  Ophthalmic Meds Ordered this visit:  No orders of the defined types were placed in this encounter.      Return in  about 1 year (around 03/29/2022) for DILATE OU, OCT.  There are no Patient Instructions on file for this visit.   Explained the diagnoses, plan, and follow up with the patient and they expressed understanding.  Patient expressed understanding of the importance of proper follow up care.   Clent Demark Thu Baggett M.D. Diseases & Surgery of the Retina and Vitreous [facility]ina [facility] Diabetic [place] 03/29/21     Abbreviations: M myopia (nearsighted); A astigmatism; H hyperopia (farsighted); P presbyopia; Mrx spectacle prescription;  CTL contact lenses; OD right eye; OS left eye; OU both eyes  XT exotropia; ET esotropia; PEK punctate epithelial keratitis; PEE punctate epithelial erosions; DES dry eye syndrome; MGD meibomian gland dysfunction; ATs artificial tears; PFAT's preservative free artificial tears; [place] nuclear sclerotic cataract; PSC posterior subcapsular cataract; ERM epi-retinal membrane; PVD posterior vitreous detachment; RD retinal detachment; DM diabetes mellitus; DR diabetic retinopathy; NPDR non-proliferative diabetic retinopathy; PDR proliferative diabetic retinopathy; CSME clinically significant macular edema; DME diabetic macular edema; dbh dot blot hemorrhages; CWS cotton wool spot; POAG primary open angle glaucoma; C/D cup-to-disc ratio; HVF humphrey  visual field; GVF goldmann visual field; OCT optical coherence tomography; IOP intraocular pressure; BRVO Branch retinal vein occlusion; CRVO central retinal vein occlusion; CRAO central retinal artery occlusion; BRAO branch retinal artery occlusion; RT retinal tear; SB scleral buckle; PPV pars plana vitrectomy; VH Vitreous hemorrhage; PRP panretinal laser photocoagulation; IVK intravitreal kenalog; VMT vitreomacular traction; MH Macular hole;  NVD neovascularization of the disc; NVE neovascularization elsewhere; AREDS age related eye disease study; ARMD age related macular degeneration; POAG primary open angle glaucoma; EBMD epithelial/anterior basement  membrane dystrophy; ACIOL anterior chamber intraocular lens; IOL intraocular lens; PCIOL posterior chamber intraocular lens; Phaco/IOL phacoemulsification with intraocular lens placement; [place] photorefractive keratectomy; LASIK laser assisted in situ keratomileusis; HTN hypertension; DM diabetes mellitus; COPD chronic obstructive pulmonary disease

## 2021-03-29 NOTE — Assessment & Plan Note (Signed)
OD, macular thickening continues to slowly resolve and improved vision after vitrectomy 07/29/2020

## 2021-07-09 DIAGNOSIS — M19071 Primary osteoarthritis, right ankle and foot: Secondary | ICD-10-CM | POA: Diagnosis not present

## 2021-07-09 DIAGNOSIS — M79671 Pain in right foot: Secondary | ICD-10-CM | POA: Diagnosis not present

## 2021-07-09 DIAGNOSIS — M25571 Pain in right ankle and joints of right foot: Secondary | ICD-10-CM | POA: Diagnosis not present

## 2021-07-27 ENCOUNTER — Other Ambulatory Visit: Payer: Self-pay | Admitting: Cardiology

## 2021-07-28 ENCOUNTER — Ambulatory Visit: Payer: Medicare HMO | Admitting: Neurology

## 2021-09-30 ENCOUNTER — Other Ambulatory Visit: Payer: Self-pay | Admitting: Cardiology

## 2021-10-04 NOTE — Telephone Encounter (Signed)
Prescription refill request for Eliquis received. Indication:Afib Last office visit:3/22 Scr:0.9 Age: 75 Weight:88 kg  Prescription refilled

## 2021-10-13 DIAGNOSIS — M19071 Primary osteoarthritis, right ankle and foot: Secondary | ICD-10-CM | POA: Diagnosis not present

## 2021-10-23 ENCOUNTER — Other Ambulatory Visit: Payer: Self-pay | Admitting: Cardiology

## 2021-11-26 DIAGNOSIS — L570 Actinic keratosis: Secondary | ICD-10-CM | POA: Diagnosis not present

## 2021-11-26 DIAGNOSIS — L820 Inflamed seborrheic keratosis: Secondary | ICD-10-CM | POA: Diagnosis not present

## 2021-11-26 DIAGNOSIS — X32XXXD Exposure to sunlight, subsequent encounter: Secondary | ICD-10-CM | POA: Diagnosis not present

## 2021-11-26 DIAGNOSIS — D0462 Carcinoma in situ of skin of left upper limb, including shoulder: Secondary | ICD-10-CM | POA: Diagnosis not present

## 2021-11-29 DIAGNOSIS — M19071 Primary osteoarthritis, right ankle and foot: Secondary | ICD-10-CM | POA: Diagnosis not present

## 2021-11-30 ENCOUNTER — Telehealth: Payer: Self-pay

## 2021-11-30 NOTE — Telephone Encounter (Signed)
° °  Pre-operative Risk Assessment    Patient Name: [name]  DOB: [birth year] MRN: [id number]      Request for Surgical Clearance    Procedure:   Rt Total Ankle Replacement  Date of Surgery:  Clearance TBD                                 Surgeon:  Dr. Wylene Simmer Surgeon's Group or Practice Name:  [facility] Phone number:  [phone] Fax number:  [phone]   Type of Clearance Requested:   - Medical  - Pharmacy:  Hold Apixaban (Eliquis)     Type of Anesthesia:  General    Additional requests/questions:      SignedBelinda Block [name]   11/30/2021, 4:15 PM

## 2021-12-01 NOTE — Telephone Encounter (Signed)
° °  Name: [name]  DOB: [birth year]  MRN: [id number]   Primary Cardiologist: Peter Martinique, MD  Chart reviewed as part of pre-operative protocol coverage. Patient was contacted 12/01/2021 in reference to pre-operative risk assessment for pending surgery as outlined below.  [name] was last seen on 01/20/21 by Dr. Martinique.  Since that day, [name] has done well. She can complete more than 4.0 METS without angina.   Per our clinical pharmacist: Patient with diagnosis of afib on Eliquis for anticoagulation.     Procedure: right TKA Date of procedure: TBD   CHA2DS2-VASc Score = 5  This indicates a 7.2% annual risk of stroke. The patient's score is based upon: CHF History: 0 HTN History: 1 Diabetes History: 0 Stroke History: 0 Vascular Disease History: 1 Age Score: 2 Gender Score: 1   CrCl 7mL/min using adjusted body weight   Per office protocol, patient can hold Eliquis for 3 days prior to procedure  Therefore, based on ACC/AHA guidelines, the patient would be at acceptable risk for the planned procedure without further cardiovascular testing.   The patient was advised that if she develops new symptoms prior to surgery to contact our office to arrange for a follow-up visit, and she verbalized understanding.  I will route this recommendation to the requesting party via Epic fax function and remove from pre-op pool. Please call with questions.  Ledora Bottcher, PA 12/01/2021, 3:40 PM

## 2021-12-01 NOTE — Telephone Encounter (Signed)
Patient with diagnosis of afib on Eliquis for anticoagulation.    Procedure: right TKA Date of procedure: TBD  CHA2DS2-VASc Score = 5  This indicates a 7.2% annual risk of stroke. The patient's score is based upon: CHF History: 0 HTN History: 1 Diabetes History: 0 Stroke History: 0 Vascular Disease History: 1 Age Score: 2 Gender Score: 1   CrCl 59mL/min using adjusted body weight  Per office protocol, patient can hold Eliquis for 3 days prior to procedure.

## 2021-12-02 ENCOUNTER — Other Ambulatory Visit (HOSPITAL_COMMUNITY): Payer: Self-pay | Admitting: Orthopedic Surgery

## 2021-12-06 DIAGNOSIS — M19071 Primary osteoarthritis, right ankle and foot: Secondary | ICD-10-CM | POA: Diagnosis not present

## 2021-12-24 DIAGNOSIS — Z85828 Personal history of other malignant neoplasm of skin: Secondary | ICD-10-CM | POA: Diagnosis not present

## 2021-12-24 DIAGNOSIS — Z08 Encounter for follow-up examination after completed treatment for malignant neoplasm: Secondary | ICD-10-CM | POA: Diagnosis not present

## 2021-12-24 DIAGNOSIS — L820 Inflamed seborrheic keratosis: Secondary | ICD-10-CM | POA: Diagnosis not present

## 2021-12-29 ENCOUNTER — Other Ambulatory Visit: Payer: Self-pay

## 2021-12-29 ENCOUNTER — Encounter (HOSPITAL_BASED_OUTPATIENT_CLINIC_OR_DEPARTMENT_OTHER): Payer: Self-pay | Admitting: Orthopedic Surgery

## 2022-01-03 ENCOUNTER — Encounter (HOSPITAL_BASED_OUTPATIENT_CLINIC_OR_DEPARTMENT_OTHER)
Admission: RE | Admit: 2022-01-03 | Discharge: 2022-01-03 | Disposition: A | Discharge status: Home / Self Care | Payer: Medicare HMO | Source: Ambulatory Visit | Attending: Orthopedic Surgery | Admitting: Orthopedic Surgery

## 2022-01-03 DIAGNOSIS — I10 Essential (primary) hypertension: Secondary | ICD-10-CM | POA: Diagnosis not present

## 2022-01-03 DIAGNOSIS — K219 Gastro-esophageal reflux disease without esophagitis: Secondary | ICD-10-CM | POA: Diagnosis not present

## 2022-01-03 DIAGNOSIS — G709 Myoneural disorder, unspecified: Secondary | ICD-10-CM | POA: Diagnosis not present

## 2022-01-03 DIAGNOSIS — M6701 Short Achilles tendon (acquired), right ankle: Secondary | ICD-10-CM | POA: Diagnosis not present

## 2022-01-03 DIAGNOSIS — I480 Paroxysmal atrial fibrillation: Secondary | ICD-10-CM | POA: Diagnosis not present

## 2022-01-03 DIAGNOSIS — M19071 Primary osteoarthritis, right ankle and foot: Secondary | ICD-10-CM | POA: Diagnosis not present

## 2022-01-03 DIAGNOSIS — G4730 Sleep apnea, unspecified: Secondary | ICD-10-CM | POA: Diagnosis not present

## 2022-01-03 DIAGNOSIS — F419 Anxiety disorder, unspecified: Secondary | ICD-10-CM | POA: Diagnosis not present

## 2022-01-03 LAB — SURGICAL PCR SCREEN
MRSA, PCR: NEGATIVE
Staphylococcus aureus: NEGATIVE

## 2022-01-03 NOTE — Progress Notes (Signed)

## 2022-01-05 NOTE — Anesthesia Preprocedure Evaluation (Addendum)
Anesthesia Evaluation  ?Patient identified by MRN, date of birth, ID band ?Patient awake ? ? ? ?Reviewed: ?Allergy & Precautions, NPO status , Patient's Chart, lab work & pertinent test results ? ?History of Anesthesia Complications ?(+) PONV and history of anesthetic complications ? ?Airway ?Mallampati: II ? ?TM Distance: >3 FB ?Neck ROM: Full ? ? ? Dental ? ?(+) Teeth Intact, Dental Advisory Given ?  ?Pulmonary ?sleep apnea ,  ?  ?breath sounds clear to auscultation ? ? ? ? ? ? Cardiovascular ?hypertension, Pt. on medications and Pt. on home beta blockers ?+ dysrhythmias Atrial Fibrillation + Valvular Problems/Murmurs MR  ?Rhythm:Regular Rate:Normal ? ? ?  ?Neuro/Psych ?PSYCHIATRIC DISORDERS Anxiety Depression  Neuromuscular disease   ? GI/Hepatic ?Neg liver ROS, hiatal hernia, GERD  ,  ?Endo/Other  ?negative endocrine ROS ? Renal/GU ?Renal InsufficiencyRenal disease  ? ?  ?Musculoskeletal ? ?(+) Arthritis , Osteoarthritis,   ? Abdominal ?Normal abdominal exam  (+)   ?Peds ? Hematology ?negative hematology ROS ?(+)   ?Anesthesia Other Findings ? ? Reproductive/Obstetrics ? ?  ? ? ? ? ? ? ? ? ? ? ? ? ? ?  ?  ? ? ? ? ? ? ? ?Lab Results  ?Component Value Date  ? WBC 5.9 07/22/2020  ? HGB 13.3 07/22/2020  ? HCT 38.8 07/22/2020  ? MCV 90 07/22/2020  ? PLT 207 07/22/2020  ? ?Lab Results  ?Component Value Date  ? INR 1.2 09/20/2019  ? INR 3.2 09/28/2015  ? INR 2.6 09/02/2015  ? PROTIME 19.2 04/14/2009  ? ? ? ?Anesthesia Physical ? ?Anesthesia Plan ? ?ASA: 3 ? ?Anesthesia Plan: Regional and General  ? ?Post-op Pain Management:  Regional for Post-op pain  ? ?Induction: Intravenous ? ?PONV Risk Score and Plan: 4 or greater and Ondansetron, Dexamethasone, Midazolam, Treatment may vary due to age or medical condition, Scopolamine patch - Pre-op, Propofol infusion and TIVA ? ?Airway Management Planned: LMA ? ?Additional Equipment: None ? ?Intra-op Plan:  ? ?Post-operative Plan: Extubation in  OR ? ?Informed Consent: I have reviewed the patients History and Physical, chart, labs and discussed the procedure including the risks, benefits and alternatives for the proposed anesthesia with the patient or authorized representative who has indicated his/her understanding and acceptance.  ? ? ? ?Dental advisory given ? ?Plan Discussed with: CRNA, Anesthesiologist and Surgeon ? ?Anesthesia Plan Comments: (GA/LMA. Pop/saph block for pain control. TIVA given extensive h/o PONV)  ? ? ? ? ? ?Anesthesia Quick Evaluation ? ?

## 2022-01-06 ENCOUNTER — Encounter (HOSPITAL_BASED_OUTPATIENT_CLINIC_OR_DEPARTMENT_OTHER): Payer: Self-pay | Admitting: Orthopedic Surgery

## 2022-01-06 ENCOUNTER — Ambulatory Visit (HOSPITAL_BASED_OUTPATIENT_CLINIC_OR_DEPARTMENT_OTHER): Payer: Medicare HMO | Admitting: Anesthesiology

## 2022-01-06 ENCOUNTER — Encounter (HOSPITAL_BASED_OUTPATIENT_CLINIC_OR_DEPARTMENT_OTHER)
Admission: RE | Disposition: A | Discharge status: Home / Self Care | Payer: Self-pay | Source: Home / Self Care | Attending: Orthopedic Surgery

## 2022-01-06 ENCOUNTER — Ambulatory Visit (HOSPITAL_COMMUNITY): Payer: Medicare HMO

## 2022-01-06 ENCOUNTER — Other Ambulatory Visit: Payer: Self-pay

## 2022-01-06 ENCOUNTER — Ambulatory Visit (HOSPITAL_BASED_OUTPATIENT_CLINIC_OR_DEPARTMENT_OTHER)
Admission: RE | Admit: 2022-01-06 | Discharge: 2022-01-06 | Disposition: A | Discharge status: Home / Self Care | Payer: Medicare HMO | Attending: Orthopedic Surgery | Admitting: Orthopedic Surgery

## 2022-01-06 DIAGNOSIS — Z419 Encounter for procedure for purposes other than remedying health state, unspecified: Secondary | ICD-10-CM

## 2022-01-06 DIAGNOSIS — F419 Anxiety disorder, unspecified: Secondary | ICD-10-CM | POA: Diagnosis not present

## 2022-01-06 DIAGNOSIS — M19071 Primary osteoarthritis, right ankle and foot: Secondary | ICD-10-CM | POA: Insufficient documentation

## 2022-01-06 DIAGNOSIS — I480 Paroxysmal atrial fibrillation: Secondary | ICD-10-CM | POA: Diagnosis not present

## 2022-01-06 DIAGNOSIS — G4730 Sleep apnea, unspecified: Secondary | ICD-10-CM | POA: Insufficient documentation

## 2022-01-06 DIAGNOSIS — K219 Gastro-esophageal reflux disease without esophagitis: Secondary | ICD-10-CM | POA: Insufficient documentation

## 2022-01-06 DIAGNOSIS — F418 Other specified anxiety disorders: Secondary | ICD-10-CM

## 2022-01-06 DIAGNOSIS — I10 Essential (primary) hypertension: Secondary | ICD-10-CM | POA: Diagnosis not present

## 2022-01-06 DIAGNOSIS — Z471 Aftercare following joint replacement surgery: Secondary | ICD-10-CM | POA: Diagnosis not present

## 2022-01-06 DIAGNOSIS — Z96661 Presence of right artificial ankle joint: Secondary | ICD-10-CM | POA: Diagnosis not present

## 2022-01-06 DIAGNOSIS — M6701 Short Achilles tendon (acquired), right ankle: Secondary | ICD-10-CM

## 2022-01-06 DIAGNOSIS — G709 Myoneural disorder, unspecified: Secondary | ICD-10-CM | POA: Diagnosis not present

## 2022-01-06 DIAGNOSIS — G8918 Other acute postprocedural pain: Secondary | ICD-10-CM | POA: Diagnosis not present

## 2022-01-06 HISTORY — PX: TOTAL ANKLE ARTHROPLASTY: SHX811

## 2022-01-06 SURGERY — ARTHROPLASTY, ANKLE, TOTAL
Anesthesia: Regional | Site: Ankle | Laterality: Right

## 2022-01-06 MED ORDER — ONDANSETRON HCL 4 MG/2ML IJ SOLN
4.0000 mg | Freq: Once | INTRAMUSCULAR | Status: AC | PRN
  Administered 2022-01-06: 4 mg via INTRAVENOUS

## 2022-01-06 MED ORDER — MIDAZOLAM HCL 2 MG/2ML IJ SOLN
1.0000 mg | Freq: Once | INTRAMUSCULAR | Status: AC
  Administered 2022-01-06: 1 mg via INTRAVENOUS

## 2022-01-06 MED ORDER — EPHEDRINE 5 MG/ML INJ
INTRAVENOUS | Status: AC
  Filled 2022-01-06: qty 25

## 2022-01-06 MED ORDER — PROPOFOL 10 MG/ML IV BOLUS
INTRAVENOUS | Status: AC
  Filled 2022-01-06: qty 20

## 2022-01-06 MED ORDER — FENTANYL CITRATE (PF) 100 MCG/2ML IJ SOLN
INTRAMUSCULAR | Status: AC
  Filled 2022-01-06: qty 2

## 2022-01-06 MED ORDER — SCOPOLAMINE 1 MG/3DAYS TD PT72
MEDICATED_PATCH | TRANSDERMAL | Status: AC
  Filled 2022-01-06: qty 1

## 2022-01-06 MED ORDER — LIDOCAINE HCL (CARDIAC) PF 100 MG/5ML IV SOSY
PREFILLED_SYRINGE | INTRAVENOUS | Status: DC | PRN
  Administered 2022-01-06: 30 mg via INTRAVENOUS

## 2022-01-06 MED ORDER — LIDOCAINE 2% (20 MG/ML) 5 ML SYRINGE
INTRAMUSCULAR | Status: AC
  Filled 2022-01-06: qty 25

## 2022-01-06 MED ORDER — FENTANYL CITRATE (PF) 100 MCG/2ML IJ SOLN
25.0000 ug | INTRAMUSCULAR | Status: DC | PRN
  Administered 2022-01-06 (×2): 50 ug via INTRAVENOUS

## 2022-01-06 MED ORDER — FENTANYL CITRATE (PF) 100 MCG/2ML IJ SOLN
50.0000 ug | Freq: Once | INTRAMUSCULAR | Status: AC
  Administered 2022-01-06: 50 ug via INTRAVENOUS

## 2022-01-06 MED ORDER — ONDANSETRON HCL 4 MG/2ML IJ SOLN
INTRAMUSCULAR | Status: AC
  Filled 2022-01-06: qty 2

## 2022-01-06 MED ORDER — PHENYLEPHRINE 40 MCG/ML (10ML) SYRINGE FOR IV PUSH (FOR BLOOD PRESSURE SUPPORT)
PREFILLED_SYRINGE | INTRAVENOUS | Status: AC
  Filled 2022-01-06: qty 10

## 2022-01-06 MED ORDER — CEFAZOLIN SODIUM-DEXTROSE 2-4 GM/100ML-% IV SOLN
2.0000 g | INTRAVENOUS | Status: AC
  Administered 2022-01-06: 2 g via INTRAVENOUS

## 2022-01-06 MED ORDER — SODIUM CHLORIDE 0.9 % IV SOLN: INTRAVENOUS | Status: DC

## 2022-01-06 MED ORDER — VANCOMYCIN HCL 500 MG IV SOLR
INTRAVENOUS | Status: DC | PRN
  Administered 2022-01-06: 500 mg via TOPICAL

## 2022-01-06 MED ORDER — PHENYLEPHRINE HCL (PRESSORS) 10 MG/ML IV SOLN
INTRAVENOUS | Status: DC | PRN
  Administered 2022-01-06: 80 ug via INTRAVENOUS
  Administered 2022-01-06: 40 ug via INTRAVENOUS

## 2022-01-06 MED ORDER — ATROPINE SULFATE 0.4 MG/ML IV SOLN
INTRAVENOUS | Status: AC
  Filled 2022-01-06: qty 2

## 2022-01-06 MED ORDER — ACETAMINOPHEN 500 MG PO TABS
ORAL_TABLET | ORAL | Status: AC
  Filled 2022-01-06: qty 2

## 2022-01-06 MED ORDER — PHENYLEPHRINE 40 MCG/ML (10ML) SYRINGE FOR IV PUSH (FOR BLOOD PRESSURE SUPPORT)
PREFILLED_SYRINGE | INTRAVENOUS | Status: AC
  Filled 2022-01-06: qty 30

## 2022-01-06 MED ORDER — ONDANSETRON HCL 4 MG PO TABS: 4.0000 mg | ORAL_TABLET | Freq: Every day | ORAL | 0 refills | Status: DC | PRN

## 2022-01-06 MED ORDER — ONDANSETRON HCL 4 MG/2ML IJ SOLN
INTRAMUSCULAR | Status: DC | PRN
  Administered 2022-01-06: 4 mg via INTRAVENOUS

## 2022-01-06 MED ORDER — PROPOFOL 500 MG/50ML IV EMUL
INTRAVENOUS | Status: DC | PRN
  Administered 2022-01-06: 120 ug/kg/min via INTRAVENOUS

## 2022-01-06 MED ORDER — ACETAMINOPHEN 500 MG PO TABS
1000.0000 mg | ORAL_TABLET | Freq: Once | ORAL | Status: AC
  Administered 2022-01-06: 1000 mg via ORAL

## 2022-01-06 MED ORDER — SCOPOLAMINE 1 MG/3DAYS TD PT72
1.0000 | MEDICATED_PATCH | TRANSDERMAL | Status: DC
  Administered 2022-01-06 (×2): 1.5 mg via TRANSDERMAL

## 2022-01-06 MED ORDER — OXYCODONE HCL 5 MG PO TABS
5.0000 mg | ORAL_TABLET | ORAL | 0 refills | Status: AC | PRN
Start: 2022-01-06 — End: 2022-01-11

## 2022-01-06 MED ORDER — CEFAZOLIN SODIUM-DEXTROSE 2-4 GM/100ML-% IV SOLN
INTRAVENOUS | Status: AC
  Filled 2022-01-06: qty 100

## 2022-01-06 MED ORDER — MIDAZOLAM HCL 2 MG/2ML IJ SOLN
INTRAMUSCULAR | Status: AC
  Filled 2022-01-06: qty 2

## 2022-01-06 MED ORDER — AMISULPRIDE (ANTIEMETIC) 5 MG/2ML IV SOLN
10.0000 mg | Freq: Once | INTRAVENOUS | Status: AC | PRN
  Administered 2022-01-06: 10 mg via INTRAVENOUS

## 2022-01-06 MED ORDER — VANCOMYCIN HCL 500 MG IV SOLR
INTRAVENOUS | Status: AC
  Filled 2022-01-06: qty 10

## 2022-01-06 MED ORDER — PROPOFOL 10 MG/ML IV BOLUS
INTRAVENOUS | Status: DC | PRN
  Administered 2022-01-06: 100 mg via INTRAVENOUS

## 2022-01-06 MED ORDER — SENNA 8.6 MG PO TABS
2.0000 | ORAL_TABLET | Freq: Two times a day (BID) | ORAL | 0 refills | Status: DC
Start: 2022-01-06 — End: 2022-02-16

## 2022-01-06 MED ORDER — AMISULPRIDE (ANTIEMETIC) 5 MG/2ML IV SOLN
INTRAVENOUS | Status: AC
  Filled 2022-01-06: qty 2

## 2022-01-06 MED ORDER — PHENYLEPHRINE HCL-NACL 20-0.9 MG/250ML-% IV SOLN
INTRAVENOUS | Status: DC | PRN
  Administered 2022-01-06: 40 ug/min via INTRAVENOUS

## 2022-01-06 MED ORDER — PHENYLEPHRINE 40 MCG/ML (10ML) SYRINGE FOR IV PUSH (FOR BLOOD PRESSURE SUPPORT)
PREFILLED_SYRINGE | INTRAVENOUS | Status: AC
  Filled 2022-01-06: qty 40

## 2022-01-06 MED ORDER — PROPOFOL 500 MG/50ML IV EMUL
INTRAVENOUS | Status: AC
  Filled 2022-01-06: qty 50

## 2022-01-06 MED ORDER — EPHEDRINE SULFATE (PRESSORS) 50 MG/ML IJ SOLN
INTRAMUSCULAR | Status: DC | PRN
Start: 2022-01-06 — End: 2022-01-06
  Administered 2022-01-06 (×2): 10 mg via INTRAVENOUS

## 2022-01-06 MED ORDER — ONDANSETRON HCL 4 MG/2ML IJ SOLN
INTRAMUSCULAR | Status: AC
  Filled 2022-01-06: qty 20

## 2022-01-06 MED ORDER — LACTATED RINGERS IV SOLN: INTRAVENOUS | Status: DC

## 2022-01-06 MED ORDER — EPHEDRINE 5 MG/ML INJ
INTRAVENOUS | Status: AC
  Filled 2022-01-06: qty 20

## 2022-01-06 MED ORDER — ROPIVACAINE HCL 5 MG/ML IJ SOLN
INTRAMUSCULAR | Status: DC | PRN
  Administered 2022-01-06: 50 mL via PERINEURAL

## 2022-01-06 MED ORDER — 0.9 % SODIUM CHLORIDE (POUR BTL) OPTIME
TOPICAL | Status: DC | PRN
Start: 2022-01-06 — End: 2022-01-06
  Administered 2022-01-06: 400 mL

## 2022-01-06 MED ORDER — DEXAMETHASONE SODIUM PHOSPHATE 10 MG/ML IJ SOLN
INTRAMUSCULAR | Status: DC | PRN
  Administered 2022-01-06: 4 mg via INTRAVENOUS

## 2022-01-06 MED ORDER — DEXAMETHASONE SODIUM PHOSPHATE 10 MG/ML IJ SOLN
INTRAMUSCULAR | Status: AC
  Filled 2022-01-06: qty 2

## 2022-01-06 MED ORDER — PHENYLEPHRINE HCL (PRESSORS) 10 MG/ML IV SOLN
INTRAVENOUS | Status: AC
  Filled 2022-01-06: qty 1

## 2022-01-06 MED ORDER — DOCUSATE SODIUM 100 MG PO CAPS: 100.0000 mg | ORAL_CAPSULE | Freq: Two times a day (BID) | ORAL | 0 refills | Status: DC

## 2022-01-06 SURGICAL SUPPLY — 81 items
APL PRP STRL LF DISP 70% ISPRP (MISCELLANEOUS) ×1
BANDAGE ESMARK 6X9 LF (GAUZE/BANDAGES/DRESSINGS) IMPLANT
BLADE RECIPRO TAPERED (BLADE) ×3 IMPLANT
BLADE SAW OSC ANKLE 8X63X1.19 (BLADE) ×1 IMPLANT
BLADE SAW RECIP ANKLE 8X50X1 (PIN) ×1 IMPLANT
BLADE SURG 15 STRL LF DISP TIS (BLADE) ×6 IMPLANT
BLADE SURG 15 STRL SS (BLADE) ×6
BNDG CMPR 9X6 STRL LF SNTH (GAUZE/BANDAGES/DRESSINGS)
BNDG COHESIVE 4X5 TAN ST LF (GAUZE/BANDAGES/DRESSINGS) ×3 IMPLANT
BNDG ELASTIC 4X5.8 VLCR STR LF (GAUZE/BANDAGES/DRESSINGS) ×3 IMPLANT
BNDG ELASTIC 6X5.8 VLCR STR LF (GAUZE/BANDAGES/DRESSINGS) IMPLANT
BNDG ESMARK 6X9 LF (GAUZE/BANDAGES/DRESSINGS)
CHLORAPREP W/TINT 26 (MISCELLANEOUS) ×3 IMPLANT
CLIP LOCKING VANTAGE SZ 2 (Clip) ×1 IMPLANT
COMP TALAR VT SZ 2 RT (Ankle) ×2 IMPLANT
COMPONENT TALAR VT SZ 2 RT (Ankle) IMPLANT
COVER BACK TABLE 60X90IN (DRAPES) ×6 IMPLANT
COVER MAYO STAND STRL (DRAPES) IMPLANT
CUFF TOURN SGL QUICK 34 (TOURNIQUET CUFF)
CUFF TRNQT CYL 34X4.125X (TOURNIQUET CUFF) IMPLANT
DRAPE C-ARM 42X72 X-RAY (DRAPES) ×3 IMPLANT
DRAPE C-ARMOR (DRAPES) ×3 IMPLANT
DRAPE EXTREMITY T 121X128X90 (DISPOSABLE) ×3 IMPLANT
DRAPE U-SHAPE 47X51 STRL (DRAPES) ×3 IMPLANT
DRESSING MEPILEX FLEX 4X4 (GAUZE/BANDAGES/DRESSINGS) ×2 IMPLANT
DRSG MEPILEX BORDER 4X8 (GAUZE/BANDAGES/DRESSINGS) IMPLANT
DRSG MEPILEX FLEX 4X4 (GAUZE/BANDAGES/DRESSINGS) ×2
DRSG MEPITEL 4X7.2 (GAUZE/BANDAGES/DRESSINGS) ×3 IMPLANT
DRSG PAD ABDOMINAL 8X10 ST (GAUZE/BANDAGES/DRESSINGS) ×6 IMPLANT
ELECT REM PT RETURN 9FT ADLT (ELECTROSURGICAL) ×2
ELECTRODE REM PT RTRN 9FT ADLT (ELECTROSURGICAL) ×2 IMPLANT
GAUZE SPONGE 4X4 12PLY STRL (GAUZE/BANDAGES/DRESSINGS) ×3 IMPLANT
GLOVE SRG 8 PF TXTR STRL LF DI (GLOVE) ×4 IMPLANT
GLOVE SURG ENC MOIS LTX SZ8 (GLOVE) ×3 IMPLANT
GLOVE SURG LTX SZ8 (GLOVE) ×3 IMPLANT
GLOVE SURG POLYISO LF SZ7 (GLOVE) ×1 IMPLANT
GLOVE SURG UNDER POLY LF SZ7 (GLOVE) ×2 IMPLANT
GLOVE SURG UNDER POLY LF SZ8 (GLOVE) ×4
GOWN STRL REUS W/ TWL LRG LVL3 (GOWN DISPOSABLE) ×2 IMPLANT
GOWN STRL REUS W/ TWL XL LVL3 (GOWN DISPOSABLE) ×2 IMPLANT
GOWN STRL REUS W/TWL LRG LVL3 (GOWN DISPOSABLE) ×2
GOWN STRL REUS W/TWL XL LVL3 (GOWN DISPOSABLE) ×5 IMPLANT
INSERT TIB FB ANKLE SZ 2X10 RT (Miscellaneous) ×1 IMPLANT
NEEDLE HYPO 22GX1.5 SAFETY (NEEDLE) IMPLANT
NS IRRIG 1000ML POUR BTL (IV SOLUTION) ×3 IMPLANT
PACK BASIN DAY SURGERY FS (CUSTOM PROCEDURE TRAY) ×3 IMPLANT
PAD CAST 4YDX4 CTTN HI CHSV (CAST SUPPLIES) ×4 IMPLANT
PADDING CAST ABS 4INX4YD NS (CAST SUPPLIES)
PADDING CAST ABS COTTON 4X4 ST (CAST SUPPLIES) IMPLANT
PADDING CAST COTTON 4X4 STRL (CAST SUPPLIES) ×4
PADDING CAST COTTON 6X4 STRL (CAST SUPPLIES) ×3 IMPLANT
PENCIL SMOKE EVACUATOR (MISCELLANEOUS) ×3 IMPLANT
PIN POUCH TALAR VANTAGE 2.0 (PIN) ×1 IMPLANT
PIN POUCH TALAR VANTAGE 2.5 (PIN) ×1 IMPLANT
PIN POUCH TALAR VANTAGE 3.5 (PIN) ×2 IMPLANT
PLATE TIB BEARING VT SZ 2 RT (Plate) ×1 IMPLANT
POUCH SCREW TALAR ANKLE (ORTHOPEDIC DISPOSABLE SUPPLIES) ×1 IMPLANT
SANITIZER HAND PURELL 535ML FO (MISCELLANEOUS) ×3 IMPLANT
SCOTCHCAST PLUS 3X4 WHITE (CAST SUPPLIES) ×1 IMPLANT
SCOTCHCAST PLUS 4X4 WHITE (CAST SUPPLIES) ×2 IMPLANT
SHEET MEDIUM DRAPE 40X70 STRL (DRAPES) ×3 IMPLANT
SLEEVE SCD COMPRESS KNEE MED (STOCKING) ×3 IMPLANT
SPIKE FLUID TRANSFER (MISCELLANEOUS) IMPLANT
SPONGE T-LAP 18X18 ~~LOC~~+RFID (SPONGE) ×3 IMPLANT
STOCKINETTE 6  STRL (DRAPES) ×2
STOCKINETTE 6 STRL (DRAPES) ×2 IMPLANT
SUCTION FRAZIER HANDLE 10FR (MISCELLANEOUS) ×2
SUCTION TUBE FRAZIER 10FR DISP (MISCELLANEOUS) IMPLANT
SURGILUBE 2OZ TUBE FLIPTOP (MISCELLANEOUS) ×3 IMPLANT
SUT ETHILON 3 0 PS 1 (SUTURE) ×6 IMPLANT
SUT MNCRL AB 3-0 PS2 18 (SUTURE) ×6 IMPLANT
SUT VIC AB 0 CT1 27 (SUTURE) ×2
SUT VIC AB 0 CT1 27XBRD ANBCTR (SUTURE) ×2 IMPLANT
SUT VIC AB 2-0 SH 27 (SUTURE)
SUT VIC AB 2-0 SH 27XBRD (SUTURE) IMPLANT
SYR 20ML LL LF (SYRINGE) IMPLANT
SYR BULB IRRIG 60ML STRL (SYRINGE) ×3 IMPLANT
TOWEL GREEN STERILE FF (TOWEL DISPOSABLE) ×6 IMPLANT
TUBE CONNECTING 20X1/4 (TUBING) ×3 IMPLANT
UNDERPAD 30X36 HEAVY ABSORB (UNDERPADS AND DIAPERS) ×3 IMPLANT
YANKAUER SUCT BULB TIP NO VENT (SUCTIONS) ×3 IMPLANT

## 2022-01-06 NOTE — Progress Notes (Signed)
Assisted Dr. Bass with right, ultrasound guided, popliteal, adductor canal block. Side rails up, monitors on throughout procedure. See vital signs in flow sheet. Tolerated Procedure well. 

## 2022-01-06 NOTE — Discharge Instructions (Addendum)
?Post Anesthesia Home Care Instructions ?Next tylenol dose 12:44pm. ?Activity: ?Get plenty of rest for the remainder of the day. A responsible individual must stay with you for 24 hours following the procedure.  ?For the next 24 hours, DO NOT: ?-Drive a car ?-Paediatric nurse ?-Drink alcoholic beverages ?-Take any medication unless instructed by your physician ?-Make any legal decisions or sign important papers. ? ?Meals: ?Start with liquid foods such as gelatin or soup. Progress to regular foods as tolerated. Avoid greasy, spicy, heavy foods. If nausea and/or vomiting occur, drink only clear liquids until the nausea and/or vomiting subsides. Call your physician if vomiting continues. ? ?Special Instructions/Symptoms: ?Your throat may feel dry or sore from the anesthesia or the breathing tube placed in your throat during surgery. If this causes discomfort, gargle with warm salt water. The discomfort should disappear within 24 hours. ? ?If you had a scopolamine patch placed behind your ear for the management of post- operative nausea and/or vomiting: ? ?1. The medication in the patch is effective for 72 hours, after which it should be removed.  Wrap patch in a tissue and discard in the trash. Wash hands thoroughly with soap and water. ?2. You may remove the patch earlier than 72 hours if you experience unpleasant side effects which may include dry mouth, dizziness or visual disturbances. ?3. Avoid touching the patch. Wash your hands with soap and water after contact with the patch. ?    ? ?Wylene Simmer, MD ?[facility] ? ?Please read the following information regarding your care after surgery. ? ?Medications  ?You only need a prescription for the narcotic pain medicine (ex. oxycodone, Percocet, Norco).  All of the other medicines listed below are available over the counter. ?? Aleve 2 pills twice a day for the first 3 days after surgery. ?? acetominophen (Tylenol) 650 mg every 4-6 hours as you need for minor to  moderate pain ?? oxycodone as prescribed for severe pain ?X Zofran as prescribed for post-op nausea ? ?Narcotic pain medicine (ex. oxycodone, Percocet, Vicodin) will cause constipation.  To prevent this problem, take the following medicines while you are taking any pain medicine. ?? docusate sodium (Colace) 100 mg twice a day ? senna (Senokot) 2 tablets twice a day ? ?? To help prevent blood clots, resume your Eliquis the day after surgery.  You should also get up every hour while you are awake to move around.   ? ?Weight Bearing ?? Do not bear any weight on the operated leg or foot. ? ?Cast / Splint / Dressing ?? Keep your splint, cast or dressing clean and dry.  Don?t put anything (coat hanger, pencil, etc) down inside of it.  If it gets damp, use a hair dryer on the cool setting to dry it.  If it gets soaked, call the office to schedule an appointment for a cast change. ? ? ?After your dressing, cast or splint is removed; you may shower, but do not soak or scrub the wound.  Allow the water to run over it, and then gently pat it dry. ? ?Swelling ?It is normal for you to have swelling where you had surgery.  To reduce swelling and pain, keep your toes above your nose for at least 3 days after surgery.  It may be necessary to keep your foot or leg elevated for several weeks.  If it hurts, it should be elevated. ? ?Follow Up ?Call my office at [phone] when you are discharged from the hospital or surgery center to  schedule an appointment to be seen two weeks after surgery. ? ?Call my office at [phone] if you develop a fever >101.5? F, nausea, vomiting, bleeding from the surgical site or severe pain.   ?  ?

## 2022-01-06 NOTE — Anesthesia Postprocedure Evaluation (Signed)
Anesthesia Post Note ? ?Patient: [name] ? ?Procedure(s) Performed: TOTAL ANKLE ARTHROPLASTY (Right: Ankle) ? ?  ? ?Patient location during evaluation: PACU ?Anesthesia Type: Regional and General ?Level of consciousness: awake ?Pain management: pain level controlled ?Vital Signs Assessment: post-procedure vital signs reviewed and stable ?Respiratory status: spontaneous breathing and respiratory function stable ?Cardiovascular status: stable ?Postop Assessment: no apparent nausea or vomiting ?Anesthetic complications: no ? ? ?No notable events documented. ? ?Last Vitals:  ?Vitals:  ? 01/06/22 1115 01/06/22 1130  ?BP: (!) 142/71 (!) 154/68  ?Pulse: (!) 57 (!) 58  ?Resp: 14 16  ?Temp:    ?SpO2: 97% 97%  ?  ?Last Pain:  ?Vitals:  ? 01/06/22 1130  ?TempSrc:   ?PainSc: 0-No pain  ? ? ?  ?  ?  ?  ?  ?  ? ?Merlinda Frederick ? ? ? ? ?

## 2022-01-06 NOTE — Anesthesia Procedure Notes (Signed)
Anesthesia Regional Block: Popliteal block  ? ?Pre-Anesthetic Checklist: , timeout performed,  Correct Patient, Correct Site, Correct Laterality,  Correct Procedure, Correct Position, site marked,  Risks and benefits discussed,  Surgical consent,  Pre-op evaluation,  At surgeon's request and post-op pain management ? ?Laterality: Right ? ?Prep: chloraprep     ?  ?Needles:  ?Injection technique: Single-shot ? ?Needle Type: Echogenic Stimulator Needle   ? ? ?Needle Length: 10cm  ?Needle Gauge: 20  ? ? ? ?Additional Needles: ? ? ?Procedures:,,,, ultrasound used (permanent image in chart),,    ?Narrative:  ?Start time: 01/06/2022 6:55 AM ?End time: 01/06/2022 7:00 AM ?Injection made incrementally with aspirations every 5 mL. ? ?Performed by: Personally  ?Anesthesiologist: Merlinda Frederick, MD ? ?Additional Notes: ?A functioning IV was confirmed and monitors were applied.  Sterile prep and drape, hand hygiene and sterile gloves were used.  Negative aspiration and test dose prior to incremental administration of local anesthetic. The patient tolerated the procedure well.Ultrasound  guidance: relevant anatomy identified, needle position confirmed, local anesthetic spread visualized around nerve(s), vascular puncture avoided.  Image printed for medical record.  ? ? ? ? ?

## 2022-01-06 NOTE — Transfer of Care (Signed)
Immediate Anesthesia Transfer of Care Note ? ?Patient: [name] ? ?Procedure(s) Performed: TOTAL ANKLE ARTHROPLASTY (Right: Ankle) ? ?Patient Location: PACU ? ?Anesthesia Type:GA combined with regional for post-op pain ? ?Level of Consciousness: drowsy and patient cooperative ? ?Airway & Oxygen Therapy: Patient Spontanous Breathing and Patient connected to face mask oxygen ? ?Post-op Assessment: Report given to RN and Post -op Vital signs reviewed and stable ? ?Post vital signs: Reviewed and stable ? ?Last Vitals:  ?Vitals Value Taken Time  ?BP    ?Temp    ?Pulse 59 01/06/22 0939  ?Resp 14 01/06/22 0939  ?SpO2 91 % 01/06/22 0939  ?Vitals shown include unvalidated device data. ? ?Last Pain:  ?Vitals:  ? 01/06/22 0642  ?TempSrc: Oral  ?PainSc: 0-No pain  ?   ? ?  ? ?Complications: No notable events documented. ?

## 2022-01-06 NOTE — Anesthesia Procedure Notes (Signed)
Procedure Name: LMA Insertion ?Date/Time: 01/06/2022 7:37 AM ?Performed by: Signe Colt, CRNA ?Pre-anesthesia Checklist: Patient identified, Emergency Drugs available, Suction available and Patient being monitored ?Patient Re-evaluated:Patient Re-evaluated prior to induction ?Oxygen Delivery Method: Circle System Utilized ?Preoxygenation: Pre-oxygenation with 100% oxygen ?Induction Type: IV induction ?Ventilation: Mask ventilation without difficulty ?LMA: LMA inserted ?LMA Size: 4.0 ?Number of attempts: 1 ?Airway Equipment and Method: bite block ?Placement Confirmation: positive ETCO2 ?Tube secured with: Tape ?Dental Injury: Teeth and Oropharynx as per pre-operative assessment  ? ? ? ? ?

## 2022-01-06 NOTE — Anesthesia Procedure Notes (Signed)
Anesthesia Regional Block: Adductor canal block  ? ?Pre-Anesthetic Checklist: , timeout performed,  Correct Patient, Correct Site, Correct Laterality,  Correct Procedure, Correct Position, site marked,  Risks and benefits discussed,  Surgical consent,  Pre-op evaluation,  At surgeon's request and post-op pain management ? ?Laterality: Right ? ?Prep: chloraprep     ?  ?Needles:  ?Injection technique: Single-shot ? ?Needle Type: Echogenic Stimulator Needle   ? ? ?Needle Length: 10cm  ?Needle Gauge: 20  ? ? ? ?Additional Needles: ? ? ?Procedures:,,,, ultrasound used (permanent image in chart),,    ?Narrative:  ?Start time: 01/06/2022 7:00 AM ?End time: 01/06/2022 7:05 AM ?Injection made incrementally with aspirations every 5 mL. ? ?Performed by: Personally  ?Anesthesiologist: Merlinda Frederick, MD ? ?Additional Notes: ?Functioning IV was confirmed and monitors were applied.  Sterile prep and drape,hand hygiene and sterile gloves were used. Ultrasound guidance: relevant anatomy identified, needle position confirmed, local anesthetic spread visualized around nerve(s)., vascular puncture avoided. Negative aspiration and negative test dose prior to incremental administration of local anesthetic. The patient tolerated the procedure well. ? ? ? ? ? ? ?

## 2022-01-06 NOTE — Op Note (Signed)
01/06/2022 ? ?9:51 AM ? ?PATIENT:  [name]  76 y.o. female ? ?PRE-OPERATIVE DIAGNOSIS:  right ankle osteoarthritis ? ?POST-OPERATIVE DIAGNOSIS:  1.  right ankle osteoarthritis ?     2.  Right short achilles tendon ? ?Procedure(s): ? Right TOTAL ANKLE ARTHROPLASTY ([employer] - 2 tibia, 2 talus, 10 mm poly) ? ?SURGEON:  Wylene Simmer, MD ? ?ASSISTANT: Mechele Claude, PA-C ? ?ANESTHESIA:   General, regional ? ?EBL:  minimal  ? ?TOURNIQUET:   ?Total Tourniquet Time Documented: ?Thigh (Right) - 99 minutes ?Total: Thigh (Right) - 99 minutes ? ? ?COMPLICATIONS:  None apparent ? ?DISPOSITION:  Extubated, awake and stable to recovery. ? ?INDICATION FOR PROCEDURE: 76 year old female with past medical history significant for atrial fibrillation has a long history of right ankle pain due to right ankle arthritis.  She has failed nonoperative treatment to date including activity modification, oral anti-inflammatories, bracing and therapy.  She presents today for right total ankle replacement.  The risks and benefits of the alternative treatment options have been discussed in detail.  The patient wishes to proceed with surgery and specifically understands risks of bleeding, infection, nerve damage, blood clots, need for additional surgery, amputation and death.  ? ?PROCEDURE IN DETAIL:  After pre operative consent was obtained, and the correct operative site was identified, the patient was brought to the operating room and placed supine on the OR table.  Anesthesia was administered.  Pre-operative antibiotics were administered.  A surgical timeout was taken.  The right lower extremity was prepped and draped in standard sterile fashion with a tourniquet around the thigh.  The extremity was elevated and the tourniquet was inflated to 250 mmHg.  An AP radiograph was obtained of the knee determining the distal end of the tibial component of the total knee prosthesis.  A mark was made on the skin distal to the prosthesis  for insertion of the tibial alignment guide. ? ?A longitudinal incision was made over the anterior ankle over the EHL tendon.  Dissection was carried sharply down through the subcutaneous tissues.  The extensor retinaculum was released over the EHL proximally and distally.  The interval between the tibialis anterior and EHL was then developed taking care to protect the neurovascular bundle.  The anterior joint capsule was incised and elevated medially and laterally.  Full-thickness cartilage loss was noted over the entire talus.  The tibial pin was inserted percutaneously at the previously marked site in line with the medial gutter.  The tibial alignment guide was then placed on the pin and aligned with the tibial shaft.  The rotation was set in line with the medial gutter and fixed.  Varus and valgus alignment was set appropriately.  A lateral view was obtained allowing appropriate alignment of slope and resection height.  The cutting block was pinned.  Medial lateral translation was confirmed with an AP radiograph and the block pinned at the level of the joint.  The distal tibial cut was made with a reciprocating oscillating saws.  The cut bone was removed in toto.  The talar block was then positioned appropriately.  A lateral radiograph confirmed appropriate position, and the block was pinned.  The talar dome cut was made and the cut bone removed.  The size 1 block was inserted and the cuts were noted to be adequate.  The size 2 talar lollipop was inserted and held while a lateral radiograph was obtained.  Appropriate position was confirmed and the lollipop was pinned.  It was then  removed and replaced by the size 2 talar cutting guide.  The anterior chamfer cuts were made followed by the posterior chamfer cuts.  The block was removed along with all cut bone.  The talar trial was inserted after rasping the cut surfaces of the bone.  It was positioned appropriately medially and laterally and pinned in position.   The 2 lug holes were drilled.  The distal tibial cut was measured.  A size 1 tibial trial was inserted and followed by a size 2 trial which fit appropriately.  Trial poly was inserted in the tibial trial was pinned in position.  The trial poly and trial talus were both removed.  The punch holes were made in the distal tibia.  The trial was removed.  The wound was irrigated copiously.  The tibial implant was inserted after covering the implant with vancomycin powder.  The lateral view confirmed appropriate seating of the implant.  The talar implant was then inserted and impacted into position.  Again the lateral view showed appropriate seating of the implant.  A trial poly was inserted.  The heel cord was noted to be tight.  A triple hemisection percutaneous tendo Achilles lengthening was then performed with a #15 blade.  The ankle was noted to dorsiflex approximately 20 degrees with the knee extended.  The trial poly was removed and replaced with a 10 mm polyethylene spacer.  The lock was inserted and seated appropriately.  Final AP, mortise and lateral radiographs confirmed appropriate alignment of all implants.  No acute injuries were noted.  The wound was irrigated copiously and sprinkled with vancomycin powder.  The anterior joint capsule was repaired with Vicryl.  The extensor retinaculum was repaired with Vicryl.  Subcutaneous tissues were repaired with Monocryl and the skin incision was closed with nylon.  The proximal pin was removed.  The wound was irrigated and closed with nylon.  Sterile dressings were applied followed by a well-padded short leg cast.  The tourniquet was released after application of the dressings.  The patient was awakened from anesthesia and transported to the recovery room in stable condition. ? ? ?FOLLOW UP PLAN: Nonweightbearing on the right lower extremity.  Resume Eliquis postop day 1.  Follow-up in 3 weeks for suture removal and conversion to a cam boot. ? ? ? Mechele Claude PA-C  was present and scrubbed for the duration of the operative case. His assistance was essential in positioning the patient, prepping and draping, gaining and maintaining exposure, performing the operation, closing and dressing the wounds and applying the splint. ? ?  ?

## 2022-01-06 NOTE — H&P (Signed)
[name] is an 76 y.o. female.   ?Chief Complaint: Right ankle pain ?HPI: 76 year old female without significant past medical history complains of a long history of right ankle pain.  She has right ankle arthritis and has failed nonoperative treatment to date including activity modification, oral anti-inflammatories, bracing and therapy.  She presents now for right total ankle replacement. ? ?Past Medical History:  ?Diagnosis Date  ? Acute upper respiratory infections of unspecified site   ? Arthritis   ? Carotid bruit   ? Carotid disease, bilateral ([place]) 01/31/2012  ? Mild noted on US carotid  ? Cataracts, both eyes   ? Chronic insomnia   ? Diverticulosis   ? pt denies  ? Dysrhythmia   ? a fib  ? Fatty liver   ? Heart murmur   ? Has been told by an MD.  ? Hiatal hernia   ? Denies  ? History of colon polyps   ? Noted on Colonscopy  ? History of gallstones   ? Hyperlipidemia   ? Hypertension   ? Insomnia, controlled 01/01/2014  ? Internal hemorrhoids   ? Macular pucker, left eye   ? surgery planned 01-14-15  ? Menopause   ? noticed in early 45's  ? MR (mitral regurgitation) 09/10/2014  ? Mild, Noted on ECHO   ? Other acute sinusitis   ? Other malaise and fatigue   ? Paroxysmal atrial fibrillation ([facility])   ? on meds  ? Pneumonia   ? as a small child  ? PONV (postoperative nausea and vomiting)   ? Pulmonary hypertension ([place]) 09/10/2014  ? Mild, Noted on ECHO  ? Pure hypercholesterolemia   ? Restless leg syndrome   ? Routine general medical examination at a health care facility   ? Screening for lipoid disorders   ? Sleep apnea   ? no c-pap  ? Special screening for malignant neoplasms, colon   ? Uterine fibroid 09/19/1999  ? Noted on pelvis ultrasound  ? ? ?Past Surgical History:  ?Procedure Laterality Date  ? cardiolyte neg  6/06  ? cardiovascular stress MRI  12/20/06  ? afib  ? CHOLECYSTECTOMY    ? COLONOSCOPY    ? EYE SURGERY    ? cataract   Toric implant bil eyes  ? EYE SURGERY Right 07/29/2020  ? [facility]  [facility] - Dr. Zadie Rhine  ? KNEE ARTHROSCOPY    ? left  ? ORIF- L distal radius  9/07  ? left wrist  ? PARS PLANA VITRECTOMY W/ REPAIR OF MACULAR HOLE    ? left eye  ? torn meniscus    ? bil knees    ? TOTAL KNEE ARTHROPLASTY Left 05/31/2019  ? Procedure: LEFT TOTAL KNEE ARTHROPLASTY;  Surgeon: Mcarthur Rossetti, MD;  Location: [facility];  Service: Orthopedics;  Laterality: Left;  ? TOTAL KNEE ARTHROPLASTY Right 09/20/2019  ? Procedure: RIGHT TOTAL KNEE ARTHROPLASTY;  Surgeon: Mcarthur Rossetti, MD;  Location: [facility];  Service: Orthopedics;  Laterality: Right;  ? TUBAL LIGATION    ? UPPER GI ENDOSCOPY    ? ? ?Family History  ?Problem Relation Age of Onset  ? Breast cancer Mother   ? Heart disease Father   ? Colon cancer Cousin 35  ? Diabetes Other   ?     cousins  ? ?Social History:  reports that she has never smoked. She has never used smokeless tobacco. She reports current alcohol use. She reports that she does not use drugs. ? ?  Allergies:  ?Allergies  ?Allergen Reactions  ? Codeine Nausea And Vomiting  ? Other Nausea And Vomiting  ?  general anesthesia  ? Oxycodone Nausea And Vomiting  ? ? ?Medications Prior to Admission  ?Medication Sig Dispense Refill  ? amLODipine (NORVASC) 2.5 MG tablet Take 1 tablet (2.5 mg total) by mouth daily. 90 tablet 3  ? ELIQUIS 5 MG TABS tablet TAKE 1 TABLET BY MOUTH TWICE A DAY 180 tablet 1  ? losartan (COZAAR) 100 MG tablet TAKE 1 TABLET BY MOUTH EVERY DAY 90 tablet 3  ? metoprolol succinate (TOPROL-XL) 50 MG 24 hr tablet TAKE 1 TABLET BY MOUTH EVERY DAY 90 tablet 3  ? propafenone (RYTHMOL) 300 MG tablet TAKE 1 TABLET (300 MG TOTAL) BY MOUTH 2 (TWO) TIMES DAILY. 180 tablet 3  ? zolpidem (AMBIEN) 5 MG tablet Take 1 tablet (5 mg total) by mouth at bedtime. 90 tablet 1  ? ? ?No results found for this or any previous visit (from the past 48 hour(s)). ?No results found. ? ?Review of Systems no recent fever, chills, nausea, vomiting or changes in her appetite ? ?Blood pressure (!)  101/57, pulse (!) 53, temperature 97.7 ?F (36.5 ?C), temperature source Oral, resp. rate 17, height 5\' 3"  (1.6 m), weight 87 kg, SpO2 98 %. ?Physical Exam  ?Well-nourished well-developed woman in no apparent distress.  Alert and oriented x4.  Normal mood and affect.  Gait is antalgic to the right.  The right ankle has healthy skin and palpable pulses in the foot.  Dorsiflexion of about 10 degrees and plantarflexion of 30 degrees.  5 out of 5 strength in plantarflexion and dorsiflexion of the ankle and toes.  Intact sensibility to light touch dorsally and plantarly at the forefoot. ? ? ?Assessment/Plan ?Right ankle arthritis -to the operating room today for right total ankle replacement.  The risks and benefits of the alternative treatment options have been discussed in detail.  The patient wishes to proceed with surgery and specifically understands risks of bleeding, infection, nerve damage, blood clots, need for additional surgery, amputation and death.  ? ?Wylene Simmer, MD ?01-25-22, 7:16 AM ? ? ? ?

## 2022-01-07 NOTE — Addendum Note (Signed)
Addendum  created 01/07/22 1237 by Willa Frater, CRNA  ? Charge Capture section accepted  ?  ?

## 2022-01-10 ENCOUNTER — Encounter (HOSPITAL_BASED_OUTPATIENT_CLINIC_OR_DEPARTMENT_OTHER): Payer: Self-pay | Admitting: Orthopedic Surgery

## 2022-01-26 DIAGNOSIS — M19071 Primary osteoarthritis, right ankle and foot: Secondary | ICD-10-CM | POA: Diagnosis not present

## 2022-01-26 DIAGNOSIS — Z4889 Encounter for other specified surgical aftercare: Secondary | ICD-10-CM | POA: Diagnosis not present

## 2022-02-16 ENCOUNTER — Encounter: Payer: Self-pay | Admitting: Neurology

## 2022-02-16 ENCOUNTER — Ambulatory Visit: Payer: Medicare HMO | Admitting: Neurology

## 2022-02-16 VITALS — BP 118/76 | HR 72 | Ht 63.0 in | Wt 186.0 lb

## 2022-02-16 DIAGNOSIS — F418 Other specified anxiety disorders: Secondary | ICD-10-CM | POA: Diagnosis not present

## 2022-02-16 DIAGNOSIS — G4700 Insomnia, unspecified: Secondary | ICD-10-CM | POA: Diagnosis not present

## 2022-02-16 DIAGNOSIS — I480 Paroxysmal atrial fibrillation: Secondary | ICD-10-CM | POA: Diagnosis not present

## 2022-02-16 DIAGNOSIS — M19071 Primary osteoarthritis, right ankle and foot: Secondary | ICD-10-CM | POA: Diagnosis not present

## 2022-02-16 DIAGNOSIS — F5104 Psychophysiologic insomnia: Secondary | ICD-10-CM

## 2022-02-16 DIAGNOSIS — Z4889 Encounter for other specified surgical aftercare: Secondary | ICD-10-CM | POA: Diagnosis not present

## 2022-02-16 MED ORDER — BELSOMRA 15 MG PO TABS: ORAL_TABLET | ORAL | 2 refills | Status: DC

## 2022-02-16 MED ORDER — ZOLPIDEM TARTRATE 5 MG PO TABS: 5.0000 mg | ORAL_TABLET | Freq: Every day | ORAL | 1 refills | Status: DC

## 2022-02-16 NOTE — Progress Notes (Signed)
? ? ?PATIENT: [name] ?DOB: [birth year] ? ? ?[place] ? ? ?REASON FOR VISIT: follow up- insomnia ?HISTORY FROM: patient ? ?HISTORY OF PRESENT ILLNESS: ?Last seen 01/20/21. Here to f/u for insomnia. Insomnia is about the same. Still having a hard time falling asleep and taking 2.'5mg'$  of her Ambien.  ? ? ?Interval history : 4-12-2023Aundria Rud. Choi is a meanwhile 76 year old Caucasian female patient of our practice who has a history of obstructive sleep apnea with hypoxia her last sleep study was in 2016/02/10, since then she has been followed for chronic insomnia.  She is able to fall asleep with a low-dose of Ambien 2.5 mg, and she has to get up 2-3 times each night to go to the bathroom but is able to go back to sleep.  She lives alone.  Her husband died in 02/09/18.  Current medication list was reviewed evaluated she is on Eliquis Cozaar, Toprol, Rythmol for atrial fibrillation, again Ambien she takes at a very low-dose and I am glad she does that.  She had 2 knee replacements and recently had an ankle surgery on the right.  So temporarily she was on pain medication and opiates worked to put he to sleep, but she only took it for 7 days post surgery. She fought nausea as well on opiates. I had in Feb 09, 2018 suggested switching to Trazodone, but that failed.  ?She would benefit from cognitive behavior therapy- mind is racing. ? ?9-14Apr 05, 2020, RV with [name], a 76 year old hearing impaired Caucasian female patient.  ?[name] lost her husband and is now living by herself, she has not to her knowledge contracted any COVID or was exposed to.  She has been followed here for years for obstructive sleep apnea with sleep hypoxemia and has also chronic insomnia.  She is not daytime excessively sleepy endorsing the Epworth score at 1 point today the fatigue severity at 18 points and the geriatric depression score at 4 out of 15 points.  Her hearing aids no longer serve her well and therefore we have  switched to a face shield on my part to that she can read my lips.medicare no longer paid for her machine. No longer on CPAP. Pharnacy is now [facility]- she wants Ambien refilled, but understands 5 mg is her age limit. She underwent knee replacement. Post surgery, left knee- &-24-2020.,she used oxycodone and tramadol, but couldn't  tolerate these, she lost appetit , was latently nauseated.  She wants to use Ambien instead.  ? ? ?07-18-2018, Rv with [name] -a 76 year old female patient who has become her husbands caretaker.  ? She endorses today the fatigue severity scale at 29 points, and her Epworth sleepiness score at 2 points.  The patient has suffered for a long time but is chronic insomnia, and underlying condition that has been treated with Ambien at 10 mg.  And she continues to have a.  Of wakefulness somewhere between 2 and 3 AM, this is also correlated to her Fitbit recording.  Her husband continues to receive chemotherapy, she had been successful in losing some weight but has been stable, certainly stress related.  ? ? ?01-15-2018, RV for this 76 year old caucasian married female with chronic insomnia- and who has learnt to live with it. She reports bedtime is around 11.30 pm, sleeping for about 7 hours total.  ?Her husband is on chemotherapy and tolerating it well, he takes naps in daytime, she doesn't.  She has  lost weight again, and has not gained back over the last 12 month. She reports her cardiologist is happy with her, too. Her endorsing the Epworth score at only 2 points, FSS 23,  ? ?Today 12/27/16: Mrs. [name] is a 75 year old female with a history of chronic insomnia. She returns today for follow-up. At her last visit she was advised that she did not have to use the CPAP as it was not offering her much benefit. She states that she's been using Ambien 10 mg at bedtime. She states that this is the only medication that has offered her some benefit. She states that she still wakes up numerous times a  night but she is able to go back to sleep. She reports on the rare occasion she will have a night that she cannot sleep at all. She was referred to Dr. Casimiro Needle but states that she never made an appointment. She states that her husband has had a lot of health issues and may being consumed with that for the last several months. She states that she understands that she has a lot of anxiety but she does not feel depressed. She returns today for an evaluation. ? ?HISTORY  01-20-2012 per Dr. Brett Fairy' notes: [name] is a 89 female and seen here in referral  from [name] for chronic insomnia. The patient will be evaluated for organic reasons of insomnia.  ? ?The patient was last seen on  01-20-12  and has lost a lot of weight in the meantime. After trazodone and Remeron did fail to produce satisfying results for now she will sometimes take a Benadryl or Tylenol PM to assist her with her sleep initiation. She thinks nocturia  was never the main problem. The patient states that she has lost a lot of weight intentionally and that this has helped certainly some of her other health issues including hypertension. She was diagnosed with atrial fibrillation in the past has been on chronic anticoagulation. She reported that topiramate caused her to be drowsy and daytime when taken in the morning for that reason we will change the intake time 2 PM this may assist with her sleepiness as well as was her headaches. It has also helped her to lose weight.  ?At bedtime routines were reviewed : the patient was to bed around 11 PM rises at 6:30 AM depending on how much sleep she got.  ?She did have amnestic spells from after some nights when using Ambien- but she sleeps all much better on Ambien but on the other medications.  ?The onset of her insomnia was related to the development of menopause but she only has really hot flashes now- she uses Ambien chronically for 2 decades.   ?Her bedroom is described as quiet, cool and dark  she shares a bedroom with her husband, who is very sick.  ?  ? ?Interval history , 01-13-15 CD, ?Mrs. Delmar is seen here following up on her sleep study from 01-29-14 the study showed a patient with normal heart rate some prolonged oxygen desaturation and very mild apnea. A problem for the patient was that she couldn't really sleep through the night she was 53 minutes awake during her sleep study. 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 Dr. Zenia Resides had originally been referred to the patient for insomnia evaluation. Based on the hypoxemia and the mix of a mild sleep apnea , she could be invited the patient to come in for a CPAP titration split-night study. ?The patient is  beginning cataracts and she has just been seen by optometry, she has been insomnic again. Testing was suspected to reveal also retinal disease and she was referred to Dr. Zadie Rhine a retinal specialist. She has a macular pucker. Surgery is scheduled for tomorrow morning 01-14-15. She is in need of a refill for Ambien which he uses to treat her chronic insomnia. In spite of this the last week had been difficult for her I think because of anxiety in anticipation of the surgery. Her fatigue severity score is up to 40 points and her fatigue her Epworth sleepiness score is normal at 2 points. ?  ?Her sleep is not restorative, still. She will have an overnight pulse-oximetry after her surgery is done. I will schedule this though [facility].  Rv with NP or me in May-June 2016. ?  ?06-04-15, ?Mrs. Labus is here status post cataract ectomy bilaterally and surgery to the retina. As I had quoted in my last visit she underwent a sleep study on 3-20 5-15. She continued to complain of nonrestorative not refreshing sleep. ?She feels however that she has started to manage better with that and her fatigue is no longer as excessive. She endorsed the fatigue severity score of 31 points and the Epworth sleepiness score at 3 points. She's not likely to fall  asleep unintended. She doesn't struggle with sleep attacks. In her geriatric depression score she endorsed only 1 point which is not indicative of suffering from clinical depression. ?She wears a fit bit,

## 2022-02-16 NOTE — Patient Instructions (Signed)
Suvorexant Tablets ?What is this medication? ?SUVOREXANT (SOO voe REX ant) treats insomnia. It helps you go to sleep faster and stay asleep through the night. ?This medicine may be used for other purposes; ask your health care provider or pharmacist if you have questions. ?COMMON BRAND NAME(S): Belsomra ?What should I tell my care team before I take this medication? ?They need to know if you have any of these conditions: ?Depression ?History of substance abuse or addiction ?History of a sudden onset of muscle weakness (cataplexy) ?History of falling asleep often at unexpected times (narcolepsy) ?If you often drink alcohol ?Liver disease ?Lung or breathing disease ?Sleep apnea ?Sleep-walking, driving, eating or other activity while not fully awake after taking a sleep medication ?Suicidal thoughts, plans, or attempt; a previous suicide attempt by you or a family member ?An unusual or allergic reaction to suvorexant, other medications, foods, dyes, or preservatives ?Pregnant or trying to get pregnant ?Breast-feeding ?How should I use this medication? ?Take this medication by mouth with water 30 minutes before going to bed. Follow the directions on the prescription label. It is better to take this medication on an empty stomach. Do not take your medication more often than directed. ?A special MedGuide will be given to you by the pharmacist with each prescription and refill. Be sure to read this information carefully each time. ?Talk to your care team about the use of this medication in children. Special care may be needed. ?Overdosage: If you think you have taken too much of this medicine contact a poison control center or emergency room at once. ?NOTE: This medicine is only for you. Do not share this medicine with others. ?What if I miss a dose? ?This does not apply. This medication should only be taken as directed before going to sleep. Do not take double or extra doses. ?What may interact with this  medication? ?Alcohol ?Antihistamines for allergy, cough, or cold ?Certain antibiotics, such as erythromycin or clarithromycin ?Certain antivirals for HIV or hepatitis ?Certain medications for fungal infections, such as itraconazole, ketoconazole, posaconazole ?Certain medications for mental health conditions ?Certain medications for seizures, such as carbamazepine, phenytoin, phenobarbital ?Conivaptan ?Diltiazem ?General anesthetics, such as halothane, isoflurane, methoxyflurane, propofol ?Grapefruit juice ?Medications that relax muscles for surgery ?Opioid medications for pain ?Other medications for sleep ?Rifampin ?Viburnum ?Verapamil ?This list may not describe all possible interactions. Give your health care provider a list of all the medicines, herbs, non-prescription drugs, or dietary supplements you use. Also tell them if you smoke, drink alcohol, or use illegal drugs. Some items may interact with your medicine. ?What should I watch for while using this medication? ?Visit your care team for regular checks on your progress. Keep a regular sleep schedule by going to bed at about the same time each night. Avoid caffeine-containing drinks in the evening hours. Talk to your care team if your insomnia worsens or is not better within 7 to 10 days. ?After taking this medication, you may get up out of bed and do an activity that you do not know you are doing. The next morning, you may have no memory of this. Activities include driving a car ("sleep-driving"), making and eating food, talking on the phone, sexual activity, and sleep-walking. Serious injuries have occurred. Stop the medication and call your care team right away if you find out you have done any of these activities. Do not take this medication if you have used alcohol that evening. Do not take it if you have taken another  medication for sleep. The risk of doing these sleep-related activities is higher. ?Do not take this medication unless you are  able to stay in bed for a full night (7 to 8 hours) before you must be active again. Tell your care team if you will need to perform activities requiring full alertness, such as driving, the next day. You may have a decrease in mental alertness the day after use, even if you feel that you are fully awake. Do not stand or sit up quickly after taking this medication, especially if you are an older patient. This reduces the risk of dizzy or fainting spells. ?If you or your family notice any changes in your moods or behavior, such as new or worsening depression, thoughts of harming yourself, anxiety, other unusual or disturbing thoughts, or memory loss, call your care team right away. ?After you stop taking this medication, you may have trouble falling asleep. This is called rebound insomnia. This problem usually goes away on its own after 1 or 2 nights. ?What side effects may I notice from receiving this medication? ?Side effects that you should report to your care team as soon as possible: ?Allergic reactions--skin rash, itching, hives, swelling of the face, lips, tongue, or throat ?CNS depression--slow or shallow breathing, shortness of breath, feeling faint, dizziness, confusion, trouble staying awake ?Mood and behavior changes--anxiety, nervousness, confusion, hallucinations, irritability, hostility, thoughts of suicide or self-harm, worsening mood, feelings of depression ?Sudden and temporary muscle weakness ?Unable to move or speak for several minutes upon waking or going to sleep ?Unusual sleep behaviors or activities you do not remember, such as driving, eating, or sexual activity ?Side effects that usually do not require medical attention (report these to your care team if they continue or are bothersome): ?Drowsiness the day after use ?Vivid dreams or nightmares ?This list may not describe all possible side effects. Call your doctor for medical advice about side effects. You may report side effects to FDA at  1-800-FDA-1088. ?Where should I keep my medication? ?Keep out of the reach of children and pets. This medication can be abused. Keep it in a safe place to protect it from theft. Do not share it with anyone. It is only for you. Selling or giving away this medication is dangerous and against the law. ?Store between 20 and 25 degrees C (68 and 77 degrees F). Protect from light and moisture. Keep the container tightly closed. Get rid of any unused medication after the expiration date. ?This medication may cause harm and death if it is taken by other adults, children, or pets. It is important to get rid of the medication as soon as you no longer need it or it is expired. You can do this in two ways: ?Take the medication to a medication take-back program. Check with your pharmacy or law enforcement to find a location. ?If you cannot return the medication, check the label or package insert to see if the medication should be thrown out in the garbage or flushed down the toilet. If you are not sure, ask your care team. If it is safe to put it in the trash, take the medication out of the container. Mix the medication with cat litter, dirt, coffee grounds, or other unwanted substance. Seal the mixture in a bag or container. Put it in the trash. ?NOTE: This sheet is a summary. It may not cover all possible information. If you have questions about this medicine, talk to your doctor, pharmacist, or health care provider. ??  2022 Elsevier/Gold Standard (2021-06-17 00:00:00) ? ?

## 2022-02-17 ENCOUNTER — Telehealth: Payer: Self-pay | Admitting: Neurology

## 2022-02-17 NOTE — Telephone Encounter (Signed)
Referral sent to [facility] [phone]. ?

## 2022-02-21 DIAGNOSIS — M25571 Pain in right ankle and joints of right foot: Secondary | ICD-10-CM | POA: Diagnosis not present

## 2022-02-23 ENCOUNTER — Other Ambulatory Visit: Payer: Self-pay | Admitting: Cardiology

## 2022-02-24 NOTE — Progress Notes (Signed)
History of Present Illness: ?[name] is seen today to followup of atrial fibrillation and dyspnea. She has a history of paroxysmal atrial fibrillation managed with Rhythmol and metoprolol. She is on Eliquis for anticoagulation.  In October 2015 she complained of increased palpitations and her Rhythmol dose was increased with good response. She complained of increased dyspnea and had a normal stress Myoview. Echo showed normal LV function with mild MR and mild Pulmonary HTN.  ? ?In July 2020 she underwent left TKR and in November she had right TKR.   ? ?In March she had right total ankle arthroplasty by Dr Doran Durand.  ? ?She has been doing very well from a rhythm standpoint. Notes if she misses a dose of Rhytmol her heart will act up that night but as long as she is taking her medication it does very well.  ? ?Past Medical History:  ?Diagnosis Date  ? Acute upper respiratory infections of unspecified site   ? Arthritis   ? Carotid bruit   ? Carotid disease, bilateral ([place]) 01/31/2012  ? Mild noted on US carotid  ? Cataracts, both eyes   ? Chronic insomnia   ? Diverticulosis   ? pt denies  ? Dysrhythmia   ? a fib  ? Fatty liver   ? Heart murmur   ? Has been told by an MD.  ? Hiatal hernia   ? Denies  ? History of colon polyps   ? Noted on Colonscopy  ? History of gallstones   ? Hyperlipidemia   ? Hypertension   ? Insomnia, controlled 01/01/2014  ? Internal hemorrhoids   ? Macular pucker, left eye   ? surgery planned 01-14-15  ? Menopause   ? noticed in early 86's  ? MR (mitral regurgitation) 09/10/2014  ? Mild, Noted on ECHO   ? Other acute sinusitis   ? Other malaise and fatigue   ? Paroxysmal atrial fibrillation (HCC)   ? on meds  ? Pneumonia   ? as a small child  ? PONV (postoperative nausea and vomiting)   ? Pulmonary hypertension ([place]) 09/10/2014  ? Mild, Noted on ECHO  ? Pure hypercholesterolemia   ? Restless leg syndrome   ? Routine general medical examination at a health care facility   ? Screening for  lipoid disorders   ? Sleep apnea   ? no c-pap  ? Special screening for malignant neoplasms, colon   ? Uterine fibroid 09/19/1999  ? Noted on pelvis ultrasound  ? ? ?Current Outpatient Medications  ?Medication Sig Dispense Refill  ? amLODipine (NORVASC) 2.5 MG tablet TAKE 1 TABLET BY MOUTH EVERY DAY 90 tablet 3  ? ELIQUIS 5 MG TABS tablet TAKE 1 TABLET BY MOUTH TWICE A DAY 180 tablet 1  ? losartan (COZAAR) 100 MG tablet TAKE 1 TABLET BY MOUTH EVERY DAY 90 tablet 3  ? metoprolol succinate (TOPROL-XL) 50 MG 24 hr tablet TAKE 1 TABLET BY MOUTH EVERY DAY 90 tablet 3  ? propafenone (RYTHMOL) 300 MG tablet TAKE 1 TABLET (300 MG TOTAL) BY MOUTH 2 (TWO) TIMES DAILY. 180 tablet 3  ? Suvorexant (BELSOMRA) 15 MG TABS Prn insomnia . Helping to wean off ambien 30 tablet 2  ? zolpidem (AMBIEN) 5 MG tablet Take 1 tablet (5 mg total) by mouth at bedtime. 90 tablet 1  ? ?No current facility-administered medications for this visit.  ? ? ?Allergies: ?Allergies  ?Allergen Reactions  ? Codeine Nausea And Vomiting  ? Other Nausea And Vomiting  ?  general anesthesia  ? Oxycodone Nausea And Vomiting  ? ?Review of systems: As noted in history of present illness. All other review of systems are reviewed and are negative. ? ?Vital Signs: ?BP 140/76 (BP Location: Left Arm)   Pulse 61   Ht '5\' 3"'$  (1.6 m)   Wt 184 lb 6.4 oz (83.6 kg)   SpO2 96%   BMI 32.66 kg/m?  ? ?PHYSICAL EXAM: ?GENERAL:  Well appearing WF in NAD ?HEENT:  PERRL, EOMI, sclera are clear. Oropharynx is clear. ?NECK:  No jugular venous distention, carotid upstroke brisk and symmetric, no bruits, no thyromegaly or adenopathy ?LUNGS:  Clear to auscultation bilaterally ?CHEST:  Unremarkable ?HEART:  RRR,  PMI not displaced or sustained,S1 and S2 within normal limits, no S3, no S4: no clicks, no rubs, no murmurs ?ABD:  Soft, nontender. BS +, no masses or bruits. No hepatomegaly, no splenomegaly ?EXT:  2 + pulses throughout, no edema, no cyanosis no clubbing. Right ankle still  swollen from surgery. Incision looks good.  ?SKIN:  Warm and dry.  No rashes ?NEURO:  Alert and oriented x 3. Cranial nerves II through XII intact. ?PSYCH:  Cognitively intact ? ? ? ? ? ?Laboratory data: ? ?Lab Results  ?Component Value Date  ? WBC 5.9 07/22/2020  ? HGB 13.3 07/22/2020  ? HCT 38.8 07/22/2020  ? PLT 207 07/22/2020  ? GLUCOSE 112 (H) 01/20/2021  ? CHOL 187 12/25/2018  ? TRIG 107 12/25/2018  ? HDL 61 12/25/2018  ? LDLDIRECT 137.0 08/10/2010  ? LDLCALC 105 (H) 12/25/2018  ? ALT 13 07/22/2020  ? AST 16 07/22/2020  ? NA 140 01/20/2021  ? K 4.3 01/20/2021  ? CL 101 01/20/2021  ? CREATININE 0.94 01/20/2021  ? BUN 16 01/20/2021  ? CO2 23 01/20/2021  ? TSH 1.190 12/25/2018  ? INR 1.2 09/20/2019  ? ?Ecg today shows NSR rate 61. First degree AV block. Otherwise Normal. I have personally reviewed and interpreted this study. ? ?ASSESSMENT AND PLAN:  ?1. Paroxysmal atrial fibrillation. well controlled on  Rythmol. She is on chronic anticoagulation with Eliquis.   Continue current therapy. Avoid use of NSAIDs. Will check labs today. ? ?2. Hypertension. Blood pressure is controlled.  ? ?3. CKD. Check BMET today ? ?Follow up in one year.  ? ? ?

## 2022-02-28 ENCOUNTER — Encounter: Payer: Self-pay | Admitting: Cardiology

## 2022-02-28 ENCOUNTER — Ambulatory Visit: Payer: Medicare HMO | Admitting: Cardiology

## 2022-02-28 VITALS — BP 140/76 | HR 61 | Ht 63.0 in | Wt 184.4 lb

## 2022-02-28 DIAGNOSIS — I10 Essential (primary) hypertension: Secondary | ICD-10-CM | POA: Diagnosis not present

## 2022-02-28 DIAGNOSIS — I480 Paroxysmal atrial fibrillation: Secondary | ICD-10-CM | POA: Diagnosis not present

## 2022-02-28 NOTE — Telephone Encounter (Signed)
[facility] has made multiple attempts to contact the patient by phone/voicemail. They have not returned their call.  ?

## 2022-03-01 LAB — CBC WITH DIFFERENTIAL/PLATELET
Basophils Absolute: 0.1 10*3/uL (ref 0.0–0.2)
Basos: 1 %
EOS (ABSOLUTE): 0.1 10*3/uL (ref 0.0–0.4)
Eos: 2 %
Hematocrit: 39.3 % (ref 34.0–46.6)
Hemoglobin: 13.2 g/dL (ref 11.1–15.9)
Immature Grans (Abs): 0 10*3/uL (ref 0.0–0.1)
Immature Granulocytes: 0 %
Lymphocytes Absolute: 2 10*3/uL (ref 0.7–3.1)
Lymphs: 27 %
MCH: 30.1 pg (ref 26.6–33.0)
MCHC: 33.6 g/dL (ref 31.5–35.7)
MCV: 90 fL (ref 79–97)
Monocytes Absolute: 0.7 10*3/uL (ref 0.1–0.9)
Monocytes: 10 %
Neutrophils Absolute: 4.4 10*3/uL (ref 1.4–7.0)
Neutrophils: 60 %
Platelets: 272 10*3/uL (ref 150–450)
RBC: 4.38 x10E6/uL (ref 3.77–5.28)
RDW: 12.8 % (ref 11.7–15.4)
WBC: 7.3 10*3/uL (ref 3.4–10.8)

## 2022-03-01 LAB — TSH: TSH: 1.68 u[IU]/mL (ref 0.450–4.500)

## 2022-03-01 LAB — MAGNESIUM: Magnesium: 2.3 mg/dL (ref 1.6–2.3)

## 2022-03-01 LAB — BASIC METABOLIC PANEL
BUN/Creatinine Ratio: 16 (ref 12–28)
BUN: 16 mg/dL (ref 8–27)
CO2: 24 mmol/L (ref 20–29)
Calcium: 8.9 mg/dL (ref 8.7–10.3)
Chloride: 105 mmol/L (ref 96–106)
Creatinine, Ser: 0.98 mg/dL (ref 0.57–1.00)
Glucose: 72 mg/dL (ref 70–99)
Potassium: 4.9 mmol/L (ref 3.5–5.2)
Sodium: 141 mmol/L (ref 134–144)
eGFR: 59 mL/min/{1.73_m2} — ABNORMAL LOW (ref >59)

## 2022-03-02 DIAGNOSIS — M25571 Pain in right ankle and joints of right foot: Secondary | ICD-10-CM | POA: Diagnosis not present

## 2022-03-04 DIAGNOSIS — M25571 Pain in right ankle and joints of right foot: Secondary | ICD-10-CM | POA: Diagnosis not present

## 2022-03-09 DIAGNOSIS — M25571 Pain in right ankle and joints of right foot: Secondary | ICD-10-CM | POA: Diagnosis not present

## 2022-03-11 DIAGNOSIS — M25571 Pain in right ankle and joints of right foot: Secondary | ICD-10-CM | POA: Diagnosis not present

## 2022-03-16 DIAGNOSIS — M25571 Pain in right ankle and joints of right foot: Secondary | ICD-10-CM | POA: Diagnosis not present

## 2022-03-16 DIAGNOSIS — M19071 Primary osteoarthritis, right ankle and foot: Secondary | ICD-10-CM | POA: Diagnosis not present

## 2022-03-16 DIAGNOSIS — Z4889 Encounter for other specified surgical aftercare: Secondary | ICD-10-CM | POA: Diagnosis not present

## 2022-03-18 ENCOUNTER — Telehealth: Payer: Self-pay | Admitting: Family Medicine

## 2022-03-18 DIAGNOSIS — M25571 Pain in right ankle and joints of right foot: Secondary | ICD-10-CM | POA: Diagnosis not present

## 2022-03-18 NOTE — Telephone Encounter (Signed)
Lvm asking pt to call back.  Pt needs OV for left breast symptoms and to get referral.  ?

## 2022-03-18 NOTE — Telephone Encounter (Signed)
Reason for Referral Request: Left Breast ? ?Has patient been seen PCP for this complaint? ?No ? ?Patient scheduled on: N/A ? ?Referral for which specialty: Mammogram ? ?Preferred office/provider: [facility] ? ?[address], [place] [address]    ?Phone Number: [phone] ? ?  ?

## 2022-03-21 ENCOUNTER — Ambulatory Visit (INDEPENDENT_AMBULATORY_CARE_PROVIDER_SITE_OTHER): Payer: Medicare HMO | Admitting: Family

## 2022-03-21 ENCOUNTER — Encounter: Payer: Self-pay | Admitting: Family

## 2022-03-21 VITALS — BP 122/68 | HR 73 | Temp 98.9°F | Resp 16 | Ht 63.0 in | Wt 187.1 lb

## 2022-03-21 DIAGNOSIS — N611 Abscess of the breast and nipple: Secondary | ICD-10-CM | POA: Diagnosis not present

## 2022-03-21 DIAGNOSIS — N6321 Unspecified lump in the left breast, upper outer quadrant: Secondary | ICD-10-CM | POA: Diagnosis not present

## 2022-03-21 MED ORDER — DOXYCYCLINE HYCLATE 100 MG PO TABS: 100.0000 mg | ORAL_TABLET | Freq: Two times a day (BID) | ORAL | 0 refills | Status: AC

## 2022-03-21 NOTE — Patient Instructions (Addendum)
Call [facility] [facility] to schedule your left diagnostic mammogram and left breast ultrasound as I have sent the electronic order to their facility.  ?Here is their number : [phone]  ? ?Sending in doxycycline 100 mg take as directed. ? ?Due to recent changes in healthcare laws, you may see results of your imaging and/or laboratory studies on MyChart before I have had a chance to review them.  I understand that in some cases there may be results that are confusing or concerning to you. Please understand that not all results are received at the same time and often I may need to interpret multiple results in order to provide you with the best plan of care or course of treatment. Therefore, I ask that you please give me 2 business days to thoroughly review all your results before contacting my office for clarification. Should we see a critical lab result, you will be contacted sooner.  ? ?It was a pleasure seeing you today! Please do not hesitate to reach out with any questions and or concerns. ? ?Regards,  ? ?Bridget Westbrooks ?FNP-C ? ? ?

## 2022-03-21 NOTE — Progress Notes (Signed)
? ?Established Patient Office Visit ? ?Subjective:  ?Patient ID: [name], female    DOB: 04/23/1946  Age: 76 y.o. MRN: [id number] ? ?CC:  ?Chief Complaint  ?Patient presents with  ? Breast Mass  ?  At the bottom left breast. Painful  ? ? ?HPI ?CATELYN FRIEL is here today with concerns.  ? ?Noticed about five days ago in the shower some tenderness left breast.  ?Last night hard to sleep when she turned over because was tender.  ?No nipple discharge, feels like breast is slightly red.  ? ?Mom with h/o breast cancer.  ?Last mammogram 2014.  ? ?Past Medical History:  ?Diagnosis Date  ? Acute upper respiratory infections of unspecified site   ? Arthritis   ? Carotid bruit   ? Carotid disease, bilateral ([place]) 01/31/2012  ? Mild noted on US carotid  ? Cataracts, both eyes   ? Chronic insomnia   ? Diverticulosis   ? pt denies  ? Dysrhythmia   ? a fib  ? Fatty liver   ? Heart murmur   ? Has been told by an MD.  ? Hiatal hernia   ? Denies  ? History of colon polyps   ? Noted on Colonscopy  ? History of gallstones   ? Hyperlipidemia   ? Hypertension   ? Insomnia, controlled 01/01/2014  ? Internal hemorrhoids   ? Macular pucker, left eye   ? surgery planned 01-14-15  ? Menopause   ? noticed in early 71's  ? MR (mitral regurgitation) 09/10/2014  ? Mild, Noted on ECHO   ? Other acute sinusitis   ? Other malaise and fatigue   ? Paroxysmal atrial fibrillation (HCC)   ? on meds  ? Pneumonia   ? as a small child  ? PONV (postoperative nausea and vomiting)   ? Pulmonary hypertension ([place]) 09/10/2014  ? Mild, Noted on ECHO  ? Pure hypercholesterolemia   ? Restless leg syndrome   ? Routine general medical examination at a health care facility   ? Screening for lipoid disorders   ? Sleep apnea   ? no c-pap  ? Special screening for malignant neoplasms, colon   ? Uterine fibroid 09/19/1999  ? Noted on pelvis ultrasound  ? ? ?Past Surgical History:  ?Procedure Laterality Date  ? cardiolyte neg  6/06  ? cardiovascular stress MRI   12/20/06  ? afib  ? CHOLECYSTECTOMY    ? COLONOSCOPY    ? EYE SURGERY    ? cataract   Toric implant bil eyes  ? EYE SURGERY Right 07/29/2020  ? Mem [facility] and Vit - Dr. Zadie Rhine  ? KNEE ARTHROSCOPY    ? left  ? ORIF- L distal radius  9/07  ? left wrist  ? PARS PLANA VITRECTOMY W/ REPAIR OF MACULAR HOLE    ? left eye  ? torn meniscus    ? bil knees    ? TOTAL ANKLE ARTHROPLASTY Right 01/06/2022  ? Procedure: TOTAL ANKLE ARTHROPLASTY;  Surgeon: Wylene Simmer, MD;  Location: [facility];  Service: Orthopedics;  Laterality: Right;  ? TOTAL KNEE ARTHROPLASTY Left 05/31/2019  ? Procedure: LEFT TOTAL KNEE ARTHROPLASTY;  Surgeon: Mcarthur Rossetti, MD;  Location: [facility];  Service: Orthopedics;  Laterality: Left;  ? TOTAL KNEE ARTHROPLASTY Right 09/20/2019  ? Procedure: RIGHT TOTAL KNEE ARTHROPLASTY;  Surgeon: Mcarthur Rossetti, MD;  Location: [facility];  Service: Orthopedics;  Laterality: Right;  ? TUBAL LIGATION    ? UPPER  GI ENDOSCOPY    ? ? ?Family History  ?Problem Relation Age of Onset  ? Breast cancer Mother   ? Heart disease Father   ? Colon cancer Cousin 86  ? Diabetes Other   ?     cousins  ? ? ?Social History  ? ?Socioeconomic History  ? Marital status: Widowed  ?  Spouse name: Sterling Big  ? Number of children: 3  ? Years of education: College  ? Highest education level: Not on file  ?Occupational History  ? Occupation: Clinical cytogeneticist  ?  Employer: [employer]  ?Tobacco Use  ? Smoking status: Never  ? Smokeless tobacco: Never  ?Vaping Use  ? Vaping Use: Never used  ?Substance and Sexual Activity  ? Alcohol use: Yes  ?  Comment: rarely  ? Drug use: No  ? Sexual activity: Not Currently  ?  Birth control/protection: Post-menopausal  ?Other Topics Concern  ? Not on file  ?Social History Narrative  ? Patient is widowed. Patient has three adult children.  ? 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   ? Patient is right-handed.  ? Patient drinks two cups of caffeine daily.   ? Patient has a college education.  ? ?Social Determinants of Health  ? ?Financial Resource Strain: Not on file  ?Food Insecurity: Not on file  ?Transportation Needs: Not on file  ?Physical Activity: Not on file  ?Stress: Not on file  ?Social Connections: Not on file  ?Intimate Partner Violence: Not on file  ? ? ?Outpatient Medications Prior to Visit  ?Medication Sig Dispense Refill  ? amLODipine (NORVASC) 2.5 MG tablet TAKE 1 TABLET BY MOUTH EVERY DAY 90 tablet 3  ? ELIQUIS 5 MG TABS tablet TAKE 1 TABLET BY MOUTH TWICE A DAY 180 tablet 1  ? losartan (COZAAR) 100 MG tablet TAKE 1 TABLET BY MOUTH EVERY DAY 90 tablet 3  ? metoprolol succinate (TOPROL-XL) 50 MG 24 hr tablet TAKE 1 TABLET BY MOUTH EVERY DAY 90 tablet 3  ? propafenone (RYTHMOL) 300 MG tablet TAKE 1 TABLET (300 MG TOTAL) BY MOUTH 2 (TWO) TIMES DAILY. 180 tablet 3  ? Suvorexant (BELSOMRA) 15 MG TABS Prn insomnia . Helping to wean off ambien 30 tablet 2  ? zolpidem (AMBIEN) 5 MG tablet Take 1 tablet (5 mg total) by mouth at bedtime. 90 tablet 1  ? ?No facility-administered medications prior to visit.  ? ? ?Allergies  ?Allergen Reactions  ? Codeine Nausea And Vomiting  ? Other Nausea And Vomiting  ?  general anesthesia  ? Oxycodone Nausea And Vomiting  ? ? ?ROS ?Review of Systems  ?Constitutional:  Negative for chills, fatigue and fever.  ?Genitourinary:   ?     Left breast mass, tender   ? ?  ?Objective:  ?  ?Physical Exam ?Constitutional:   ?   General: She is not in acute distress. ?   Appearance: Normal appearance. She is obese. She is not ill-appearing, toxic-appearing or diaphoretic.  ?Pulmonary:  ?   Effort: Pulmonary effort is normal.  ?Neurological:  ?   Mental Status: She is alert.  ? ?Breasts: right breast normal without mass, skin or nipple changes or axillary nodes, bil inverted nipples which are chronic per pt. Left lower mid breast with cyst, expressed white thick drainage, approximately 3 ml. Erythema and tenderness to site. Also tenderness  noted on left mid to outer breast.  ? ?BP 122/68   Pulse 73  Temp 98.9 ?F (37.2 ?C)   Resp 16   Ht _0  (1.6 m)   Wt 187 lb 2 oz (84.9 kg)   SpO2 97%   BMI 33.15 kg/m?  ?Wt Readings from Last 3 Encounters:  ?03/21/22 187 lb 2 oz (84.9 kg)  ?02/28/22 184 lb 6.4 oz (83.6 kg)  ?02/16/22 186 lb (84.4 kg)  ? ? ? ?Health Maintenance Due  ?Topic Date Due  ? Hepatitis C Screening  Never done  ? Zoster Vaccines- Shingrix (1 of 2) Never done  ? DEXA SCAN  Never done  ? COLONOSCOPY (Pts 45-43yr Insurance coverage will need to be confirmed)  03/02/2019  ? COVID-19 Vaccine (3 - Booster for Pfizer series) 02/12/2020  ? ? ?There are no preventive care reminders to display for this patient. ? ?Lab Results  ?Component Value Date  ? TSH 1.680 02/28/2022  ? ?Lab Results  ?Component Value Date  ? WBC 7.3 02/28/2022  ? HGB 13.2 02/28/2022  ? HCT 39.3 02/28/2022  ? MCV 90 02/28/2022  ? PLT 272 02/28/2022  ? ?Lab Results  ?Component Value Date  ? NA 141 02/28/2022  ? K 4.9 02/28/2022  ? CO2 24 02/28/2022  ? GLUCOSE 72 02/28/2022  ? BUN 16 02/28/2022  ? CREATININE 0.98 02/28/2022  ? BILITOT 0.5 07/22/2020  ? ALKPHOS 101 07/22/2020  ? AST 16 07/22/2020  ? ALT 13 07/22/2020  ? PROT 6.7 07/22/2020  ? ALBUMIN 3.9 07/22/2020  ? CALCIUM 8.9 02/28/2022  ? ANIONGAP 6 09/21/2019  ? EGFR 59 (L) 02/28/2022  ? GFR 58.00 (L) 02/15/2012  ? ?No results found for: HGBA1C ? ?  ?Assessment & Plan:  ? ?Problem List Items Addressed This Visit   ? ?  ? Other  ? Mass of upper outer quadrant of left breast  ?  Ordered mammogram dx left breast as well as [place] breast ? ?  ?  ? Relevant Medications  ? doxycycline (VIBRA-TABS) 100 MG tablet  ? Other Relevant Orders  ? MM DIAG BREAST TOMO UNI LEFT  ? [place] LTD UNI LEFT INC AXILLA  ? WOUND CULTURE  ? Left breast abscess - Primary  ?  rx doxycycline 100 mg  ?Monitor daily for worsening s/s infection ?Warm compresses to site.  ?Also ordering [place] breast.  ? ?  ?  ? Relevant Medications  ? doxycycline  (VIBRA-TABS) 100 MG tablet  ? Other Relevant Orders  ? MM DIAG BREAST TOMO UNI LEFT  ? [place]AST LTD UNI LEFT INC AXILLA  ? WOUND CULTURE  ? ? ?Meds ordered this encounter  ?Medications  ? doxycycline (VIBR

## 2022-03-21 NOTE — Telephone Encounter (Signed)
Patient called back I have made app for today with Tabitha.  ?

## 2022-03-21 NOTE — Assessment & Plan Note (Signed)
Ordered mammogram dx left breast as well as [place] left breast ?

## 2022-03-21 NOTE — Assessment & Plan Note (Signed)
rx doxycycline 100 mg  ?Monitor daily for worsening s/s infection ?Warm compresses to site.  ?Also ordering [place] left breast.  ?

## 2022-03-21 NOTE — Telephone Encounter (Signed)
Noted  

## 2022-03-23 DIAGNOSIS — M25571 Pain in right ankle and joints of right foot: Secondary | ICD-10-CM | POA: Diagnosis not present

## 2022-03-24 LAB — WOUND CULTURE
MICRO NUMBER:: 13396317
SPECIMEN QUALITY:: ADEQUATE

## 2022-03-25 ENCOUNTER — Telehealth: Payer: Self-pay | Admitting: Family Medicine

## 2022-03-25 DIAGNOSIS — M25571 Pain in right ankle and joints of right foot: Secondary | ICD-10-CM | POA: Diagnosis not present

## 2022-03-25 DIAGNOSIS — N611 Abscess of the breast and nipple: Secondary | ICD-10-CM

## 2022-03-25 DIAGNOSIS — N6321 Unspecified lump in the left breast, upper outer quadrant: Secondary | ICD-10-CM

## 2022-03-25 NOTE — Telephone Encounter (Signed)
Reason for Referral Request: breast pain  Has patient been seen PCP for this complaint? Yes  Patient scheduled on: Last seen on 03/21/2022  Referral for which specialty: Mammogram  Preferred office/provider: [facility] Address: [address] [place], [place], [place] [address] Phone Number: [phone]

## 2022-03-25 NOTE — Telephone Encounter (Addendum)
I saw this patient back in 02/2020 for an acute issue. She previously saw Dr Deborra Medina.  She never established with me but I'm now listed as her PCP.  Would clarify who she wants to establish with, would prefer she establish with another provider as I'm not currently accepting patients.   I have gone ahead and placed bilat Diag mammo/L breast US orders for recent office visit with Tabitha for left breast abscess/mass. I've placed orders externally - please fax to [facility] and have her call and schedule appt.

## 2022-03-25 NOTE — Addendum Note (Signed)
Addended by: Ria Bush on: 03/25/2022 04:43 PM   Modules accepted: Orders

## 2022-03-25 NOTE — Telephone Encounter (Signed)
Pt seen on 03/21/22 for L breast abscess.

## 2022-03-28 NOTE — Telephone Encounter (Signed)
Spoke with pt relaying Dr. Synthia Innocent message. Pt verbalizes understanding and wants to establish with Tabitha.  Offered to schedule OV to establish care with Tabitha. However, pt wants to have US(s) done first, then call back to schedule.  Tabitha, do you want to cancel [place] ordered by Dr. Darnell Level and you order it or leave orders as is?

## 2022-03-29 ENCOUNTER — Ambulatory Visit (INDEPENDENT_AMBULATORY_CARE_PROVIDER_SITE_OTHER): Payer: Medicare HMO | Admitting: Ophthalmology

## 2022-03-29 ENCOUNTER — Encounter (INDEPENDENT_AMBULATORY_CARE_PROVIDER_SITE_OTHER): Payer: Self-pay | Admitting: Ophthalmology

## 2022-03-29 DIAGNOSIS — H35371 Puckering of macula, right eye: Secondary | ICD-10-CM

## 2022-03-29 NOTE — Progress Notes (Signed)
03/29/2022     CHIEF COMPLAINT Patient presents for  Chief Complaint  Patient presents with   Retina Follow Up      HISTORY OF PRESENT ILLNESS: [name] is a 76 y.o. female who presents to the clinic today for:   HPI     Retina Follow Up           Diagnosis: Other   Laterality: right eye   Severity: moderate   Course: stable         Comments   1 YR FU OU OCT. Pt stated, "I like to read but I guess when my eyes start getting tired, its hard to keep them focused." Pt stated vision has been stable since last visit. Pt denies floaters and FOL. Pt had ankle replacement surgery March 2nd, 2023.       Last edited by Silvestre Moment on 03/29/2022  1:04 PM.      Referring physician: Ria Bush, MD [place],  [place] [phone]  HISTORICAL INFORMATION:   Selected notes from the [facility]: No current outpatient medications on file. (Ophthalmic Drugs)   No current facility-administered medications for this visit. (Ophthalmic Drugs)   Current Outpatient Medications (Other)  Medication Sig   amLODipine (NORVASC) 2.5 MG tablet TAKE 1 TABLET BY MOUTH EVERY DAY   ELIQUIS 5 MG TABS tablet TAKE 1 TABLET BY MOUTH TWICE A DAY   losartan (COZAAR) 100 MG tablet TAKE 1 TABLET BY MOUTH EVERY DAY   metoprolol succinate (TOPROL-XL) 50 MG 24 hr tablet TAKE 1 TABLET BY MOUTH EVERY DAY   propafenone (RYTHMOL) 300 MG tablet TAKE 1 TABLET (300 MG TOTAL) BY MOUTH 2 (TWO) TIMES DAILY.   Suvorexant (BELSOMRA) 15 MG TABS Prn insomnia . Helping to wean off ambien   zolpidem (AMBIEN) 5 MG tablet Take 1 tablet (5 mg total) by mouth at bedtime.   No current facility-administered medications for this visit. (Other)      REVIEW OF SYSTEMS: ROS   Negative for: Constitutional, Gastrointestinal, Neurological, Skin, Genitourinary, Musculoskeletal, HENT, Endocrine, Cardiovascular, Eyes, Respiratory, Psychiatric, Allergic/Imm,  Heme/Lymph Last edited by Silvestre Moment on 03/29/2022  1:04 PM.       ALLERGIES Allergies  Allergen Reactions   Codeine Nausea And Vomiting   Other Nausea And Vomiting    general anesthesia   Oxycodone Nausea And Vomiting    PAST MEDICAL HISTORY Past Medical History:  Diagnosis Date   Acute upper respiratory infections of unspecified site    Arthritis    Carotid bruit    Carotid disease, bilateral ([place]) 01/31/2012   Mild noted on US carotid   Cataracts, both eyes    Chronic insomnia    Diverticulosis    pt denies   Dysrhythmia    a fib   Fatty liver    Heart murmur    Has been told by an MD.   Hiatal hernia    Denies   History of colon polyps    Noted on Colonscopy   History of gallstones    Hyperlipidemia    Hypertension    Insomnia, controlled 01/01/2014   Internal hemorrhoids    Macular pucker, left eye    surgery planned 01-14-15   Menopause    noticed in early 50's   MR (mitral regurgitation) 09/10/2014   Mild, Noted on ECHO    Other acute sinusitis    Other malaise and fatigue  Paroxysmal atrial fibrillation (HCC)    on meds   Pneumonia    as a small child   PONV (postoperative nausea and vomiting)    Pulmonary hypertension (HCC) 09/10/2014   Mild, Noted on ECHO   Pure hypercholesterolemia    Restless leg syndrome    Routine general medical examination at a health care facility    Screening for lipoid disorders    Sleep apnea    no c-pap   Special screening for malignant neoplasms, colon    Uterine fibroid 09/19/1999   Noted on pelvis ultrasound   Past Surgical History:  Procedure Laterality Date   cardiolyte neg  6/06   cardiovascular stress MRI  12/20/06   afib   CHOLECYSTECTOMY     COLONOSCOPY     EYE SURGERY     cataract   Toric implant bil eyes   EYE SURGERY Right 07/29/2020   Mem Peel and Vit - Dr. Zadie Rhine   KNEE ARTHROSCOPY     left   ORIF- L distal radius  9/07   left wrist   PARS PLANA VITRECTOMY W/ REPAIR OF MACULAR HOLE      left eye   torn meniscus     bil knees     TOTAL ANKLE ARTHROPLASTY Right 01/06/2022   Procedure: TOTAL ANKLE ARTHROPLASTY;  Surgeon: Wylene Simmer, MD;  Location: [facility];  Service: Orthopedics;  Laterality: Right;   TOTAL KNEE ARTHROPLASTY Left 05/31/2019   Procedure: LEFT TOTAL KNEE ARTHROPLASTY;  Surgeon: Mcarthur Rossetti, MD;  Location: [facility];  Service: Orthopedics;  Laterality: Left;   TOTAL KNEE ARTHROPLASTY Right 09/20/2019   Procedure: RIGHT TOTAL KNEE ARTHROPLASTY;  Surgeon: Mcarthur Rossetti, MD;  Location: [facility];  Service: Orthopedics;  Laterality: Right;   TUBAL LIGATION     UPPER GI ENDOSCOPY      FAMILY HISTORY Family History  Problem Relation Age of Onset   Breast cancer Mother    Heart disease Father    Colon cancer Cousin 60   Diabetes Other        cousins    SOCIAL HISTORY Social History   Tobacco Use   Smoking status: Never   Smokeless tobacco: Never  Vaping Use   Vaping Use: Never used  Substance Use Topics   Alcohol use: Yes    Comment: rarely   Drug use: No         OPHTHALMIC EXAM:  Base Eye Exam     Visual Acuity (ETDRS)       Right Left   Dist cc 20/20 -1 20/20    Correction: Glasses         Tonometry (Tonopen, 1:08 PM)       Right Left   Pressure 12 13         Pupils       Pupils APD   Right PERRL None   Left PERRL None         Visual Fields       Left Right    Full Full         Extraocular Movement       Right Left    Full Full         Neuro/Psych     Oriented x3: Yes   Mood/Affect: Normal         Dilation     Both eyes: 1.0% Mydriacyl, 2.5% Phenylephrine @ 1:08 PM           Slit  Lamp and Fundus Exam     External Exam       Right Left   External Normal Normal         Slit Lamp Exam       Right Left   Lids/Lashes Normal Normal   Conjunctiva/Sclera White and quiet White and quiet   Cornea Clear Clear   Anterior Chamber Deep and quiet Deep and  quiet   Iris Round and reactive Round and reactive   Lens Posterior chamber intraocular lens, 1+ Posterior capsular opacification Posterior chamber intraocular lens   Anterior Vitreous Normal Normal         Fundus Exam       Right Left   Posterior Vitreous Clear, vitrectomized Vitrectomized   Disc Normal Normal   C/D Ratio 0.6 0.3   Macula No topographic distortion centrally Normal   Vessels Normal Normal   Periphery Normal Normal            IMAGING AND PROCEDURES  Imaging and Procedures for 03/29/22  OCT, Retina - OU - Both Eyes       Right Eye Quality was good. Scan locations included subfoveal. Central Foveal Thickness: 370. Progression has improved. Findings include abnormal foveal contour.   Left Eye Quality was good. Scan locations included subfoveal. Central Foveal Thickness: 398. Progression has been stable. Findings include abnormal foveal contour.   Notes Much less topographic distortion and improving macular thickening of the right eye post vit membrane peel And less thickening now some 7 years post vitrectomy left eye, for ERM             ASSESSMENT/PLAN:  Macular pucker, right eye Now some 1-1/2 years post vitrectomy membrane peel OD looks great, excellent acuity  SLEEP APNEA Review of systems reviewed with the patient.  If symptoms develop I encouraged the patient to be retested at any time she suspect this condition may occur     ICD-10-CM   1. Macular pucker, right eye  H35.371 OCT, Retina - OU - Both Eyes      1.  OU with a history of macular pucker, each eye now well repaired with excellent 20/20 vision.  Typical mild persistent thickening but no active topographic distortion nor CME present either eye  2.  3.  Ophthalmic Meds Ordered this visit:  No orders of the defined types were placed in this encounter.      Return in about 1 year (around 03/30/2023) for DILATE OU, COLOR FP, OCT.  There are no Patient Instructions on file  for this visit.   Explained the diagnoses, plan, and follow up with the patient and they expressed understanding.  Patient expressed understanding of the importance of proper follow up care.   Clent Demark Lebaron Bautch M.D. Diseases & Surgery of the Retina and Vitreous [facility] [place] 03/29/22     Abbreviations: M myopia (nearsighted); A astigmatism; H hyperopia (farsighted); P presbyopia; Mrx spectacle prescription;  CTL contact lenses; OD right eye; OS left eye; OU both eyes  XT exotropia; ET esotropia; PEK punctate epithelial keratitis; PEE punctate epithelial erosions; DES dry eye syndrome; MGD meibomian gland dysfunction; ATs artificial tears; PFAT's preservative free artificial tears; [place] nuclear sclerotic cataract; PSC posterior subcapsular cataract; ERM epi-retinal membrane; PVD posterior vitreous detachment; RD retinal detachment; DM diabetes mellitus; DR diabetic retinopathy; NPDR non-proliferative diabetic retinopathy; PDR proliferative diabetic retinopathy; CSME clinically significant macular edema; DME diabetic macular edema; dbh dot blot hemorrhages; CWS cotton wool spot; POAG primary open angle  glaucoma; C/D cup-to-disc ratio; HVF humphrey visual field; GVF goldmann visual field; OCT optical coherence tomography; IOP intraocular pressure; BRVO Branch retinal vein occlusion; CRVO central retinal vein occlusion; CRAO central retinal artery occlusion; BRAO branch retinal artery occlusion; RT retinal tear; SB scleral buckle; PPV pars plana vitrectomy; VH Vitreous hemorrhage; PRP panretinal laser photocoagulation; IVK intravitreal kenalog; VMT vitreomacular traction; MH Macular hole;  NVD neovascularization of the disc; NVE neovascularization elsewhere; AREDS age related eye disease study; ARMD age related macular degeneration; POAG primary open angle glaucoma; EBMD epithelial/anterior basement membrane dystrophy; ACIOL anterior chamber intraocular lens; IOL intraocular lens; PCIOL  posterior chamber intraocular lens; Phaco/IOL phacoemulsification with intraocular lens placement; [place] photorefractive keratectomy; LASIK laser assisted in situ keratomileusis; HTN hypertension; DM diabetes mellitus; COPD chronic obstructive pulmonary disease

## 2022-03-29 NOTE — Assessment & Plan Note (Signed)
Review of systems reviewed with the patient.  If symptoms develop I encouraged the patient to be retested at any time she suspect this condition may occur

## 2022-03-29 NOTE — Assessment & Plan Note (Signed)
Now some 1-1/2 years post vitrectomy membrane peel OD looks great, excellent acuity

## 2022-03-30 DIAGNOSIS — M25571 Pain in right ankle and joints of right foot: Secondary | ICD-10-CM | POA: Diagnosis not present

## 2022-04-06 DIAGNOSIS — M25571 Pain in right ankle and joints of right foot: Secondary | ICD-10-CM | POA: Diagnosis not present

## 2022-04-08 ENCOUNTER — Telehealth: Payer: Self-pay | Admitting: Family

## 2022-04-08 DIAGNOSIS — M25571 Pain in right ankle and joints of right foot: Secondary | ICD-10-CM | POA: Diagnosis not present

## 2022-04-08 NOTE — Telephone Encounter (Signed)
Pt stated she suppose to get  ultrasound and mammogram . Pt stated never got a call or referral for that

## 2022-04-11 NOTE — Telephone Encounter (Signed)
Patient was notified.

## 2022-04-11 NOTE — Telephone Encounter (Signed)
Left message to return call to our office. Needs to call [facility] to make her appt. [phone].

## 2022-04-20 ENCOUNTER — Ambulatory Visit
Admission: RE | Admit: 2022-04-20 | Discharge: 2022-04-20 | Disposition: A | Discharge status: Home / Self Care | Payer: Medicare HMO | Source: Ambulatory Visit | Attending: Family Medicine | Admitting: Family Medicine

## 2022-04-20 DIAGNOSIS — N6321 Unspecified lump in the left breast, upper outer quadrant: Secondary | ICD-10-CM

## 2022-04-20 DIAGNOSIS — N611 Abscess of the breast and nipple: Secondary | ICD-10-CM

## 2022-04-20 DIAGNOSIS — R928 Other abnormal and inconclusive findings on diagnostic imaging of breast: Secondary | ICD-10-CM | POA: Diagnosis not present

## 2022-04-24 NOTE — Progress Notes (Signed)
Great thank you and noted.

## 2022-06-15 ENCOUNTER — Telehealth: Payer: Self-pay | Admitting: Family

## 2022-06-15 NOTE — Telephone Encounter (Signed)
Left message for patient to call back and schedule Medicare Annual Wellness Visit (AWV) either virtually or phone   AWV-S 01/05/17 per [facility]   I left my direct # [phone]

## 2022-06-21 ENCOUNTER — Ambulatory Visit (INDEPENDENT_AMBULATORY_CARE_PROVIDER_SITE_OTHER): Payer: Medicare HMO | Admitting: *Deleted

## 2022-06-21 DIAGNOSIS — Z0000 Encounter for general adult medical examination without abnormal findings: Secondary | ICD-10-CM | POA: Diagnosis not present

## 2022-06-21 NOTE — Patient Instructions (Signed)
[name] , Thank you for taking time to come for your Medicare Wellness Visit. I appreciate your ongoing commitment to your health goals. Please review the following plan we discussed and let me know if I can assist you in the future.   Screening recommendations/referrals: Colonoscopy: no longer required Mammogram: up to date Bone Density: Education provided Recommended yearly ophthalmology/optometry visit for glaucoma screening and checkup Recommended yearly dental visit for hygiene and checkup  Vaccinations: Influenza vaccine: Education provided Pneumococcal vaccine: up to date Tdap vaccine: Education provided Shingles vaccine: Education provided     Preventive Care 78 Years and Older, Female Preventive care refers to lifestyle choices and visits with your health care provider that can promote health and wellness. What does preventive care include? A yearly physical exam. This is also called an annual well check. Dental exams once or twice a year. Routine eye exams. Ask your health care provider how often you should have your eyes checked. Personal lifestyle choices, including: Daily care of your teeth and gums. Regular physical activity. Eating a healthy diet. Avoiding tobacco and drug use. Limiting alcohol use. Practicing safe sex. Taking low-dose aspirin every day. Taking vitamin and mineral supplements as recommended by your health care provider. What happens during an annual well check? The services and screenings done by your health care provider during your annual well check will depend on your age, overall health, lifestyle risk factors, and family history of disease. Counseling  Your health care provider may ask you questions about your: Alcohol use. Tobacco use. Drug use. Emotional well-being. Home and relationship well-being. Sexual activity. Eating habits. History of falls. Memory and ability to understand (cognition). Work and work  Statistician. Reproductive health. Screening  You may have the following tests or measurements: Height, weight, and BMI. Blood pressure. Lipid and cholesterol levels. These may be checked every 5 years, or more frequently if you are over 42 years old. Skin check. Lung cancer screening. You may have this screening every year starting at age 68 if you have a 30-pack-year history of smoking and currently smoke or have quit within the past 15 years. Fecal occult blood test (FOBT) of the stool. You may have this test every year starting at age 78. Flexible sigmoidoscopy or colonoscopy. You may have a sigmoidoscopy every 5 years or a colonoscopy every 10 years starting at age 36. Hepatitis C blood test. Hepatitis B blood test. Sexually transmitted disease (STD) testing. Diabetes screening. This is done by checking your blood sugar (glucose) after you have not eaten for a while (fasting). You may have this done every 1-3 years. Bone density scan. This is done to screen for osteoporosis. You may have this done starting at age 42. Mammogram. This may be done every 1-2 years. Talk to your health care provider about how often you should have regular mammograms. Talk with your health care provider about your test results, treatment options, and if necessary, the need for more tests. Vaccines  Your health care provider may recommend certain vaccines, such as: Influenza vaccine. This is recommended every year. Tetanus, diphtheria, and acellular pertussis (Tdap, Td) vaccine. You may need a Td booster every 10 years. Zoster vaccine. You may need this after age 69. Pneumococcal 13-valent conjugate (PCV13) vaccine. One dose is recommended after age 30. Pneumococcal polysaccharide (PPSV23) vaccine. One dose is recommended after age 28. Talk to your health care provider about which screenings and vaccines you need and how often you need them. This information is not intended to  replace advice given to you by  your health care provider. Make sure you discuss any questions you have with your health care provider. Document Released: 11/20/2015 Document Revised: 07/13/2016 Document Reviewed: 08/25/2015 Elsevier Interactive Patient Education  2017 [place] [employer] Prevention in the Home Falls can cause injuries. They can happen to people of all ages. There are many things you can do to make your home safe and to help prevent falls. What can I do on the outside of my home? Regularly fix the edges of walkways and driveways and fix any cracks. Remove anything that might make you trip as you walk through a door, such as a raised step or threshold. Trim any bushes or trees on the path to your home. Use bright outdoor lighting. Clear any walking paths of anything that might make someone trip, such as rocks or tools. Regularly check to see if handrails are loose or broken. Make sure that both sides of any steps have handrails. Any raised decks and porches should have guardrails on the edges. Have any leaves, snow, or ice cleared regularly. Use sand or salt on walking paths during winter. Clean up any spills in your garage right away. This includes oil or grease spills. What can I do in the bathroom? Use night lights. Install grab bars by the toilet and in the tub and shower. Do not use towel bars as grab bars. Use non-skid mats or decals in the tub or shower. If you need to sit down in the shower, use a plastic, non-slip stool. Keep the floor dry. Clean up any water that spills on the floor as soon as it happens. Remove soap buildup in the tub or shower regularly. Attach bath mats securely with double-sided non-slip rug tape. Do not have throw rugs and other things on the floor that can make you trip. What can I do in the bedroom? Use night lights. Make sure that you have a light by your bed that is easy to reach. Do not use any sheets or blankets that are too big for your bed. They should not hang  down onto the floor. Have a firm chair that has side arms. You can use this for support while you get dressed. Do not have throw rugs and other things on the floor that can make you trip. What can I do in the kitchen? Clean up any spills right away. Avoid walking on wet floors. Keep items that you use a lot in easy-to-reach places. If you need to reach something above you, use a strong step stool that has a grab bar. Keep electrical cords out of the way. Do not use floor polish or wax that makes floors slippery. If you must use wax, use non-skid floor wax. Do not have throw rugs and other things on the floor that can make you trip. What can I do with my stairs? Do not leave any items on the stairs. Make sure that there are handrails on both sides of the stairs and use them. Fix handrails that are broken or loose. Make sure that handrails are as long as the stairways. Check any carpeting to make sure that it is firmly attached to the stairs. Fix any carpet that is loose or worn. Avoid having throw rugs at the top or bottom of the stairs. If you do have throw rugs, attach them to the floor with carpet tape. Make sure that you have a light switch at the top of the stairs and the  bottom of the stairs. If you do not have them, ask someone to add them for you. What else can I do to help prevent falls? Wear shoes that: Do not have high heels. Have rubber bottoms. Are comfortable and fit you well. Are closed at the toe. Do not wear sandals. If you use a stepladder: Make sure that it is fully opened. Do not climb a closed stepladder. Make sure that both sides of the stepladder are locked into place. Ask someone to hold it for you, if possible. Clearly mark and make sure that you can see: Any grab bars or handrails. First and last steps. Where the edge of each step is. Use tools that help you move around (mobility aids) if they are needed. These  include: Canes. Walkers. Scooters. Crutches. Turn on the lights when you go into a dark area. Replace any light bulbs as soon as they burn out. Set up your furniture so you have a clear path. Avoid moving your furniture around. If any of your floors are uneven, fix them. If there are any pets around you, be aware of where they are. Review your medicines with your doctor. Some medicines can make you feel dizzy. This can increase your chance of falling. Ask your doctor what other things that you can do to help prevent falls. This information is not intended to replace advice given to you by your health care provider. Make sure you discuss any questions you have with your health care provider. Document Released: 08/20/2009 Document Revised: 03/31/2016 Document Reviewed: 11/28/2014 Elsevier Interactive Patient Education  2017 [employer].

## 2022-06-21 NOTE — Progress Notes (Signed)
Subjective:   [name] is a 76 y.o. female who presents for Medicare Annual (Subsequent) preventive examination.  I connected with  Debbora Lacrosse on 06/21/22 by a telephone enabled telemedicine application and verified that I am speaking with the correct person using two identifiers.   I discussed the limitations of evaluation and management by telemedicine. The patient expressed understanding and agreed to proceed.  Patient location: home  Provider location: Tele-health-home    Review of Systems     Cardiac Risk Factors include: advanced age (>80mn, >>57women);family history of premature cardiovascular disease;obesity (BMI >30kg/m2);sedentary lifestyle;hypertension     Objective:    Today's Vitals   There is no height or weight on file to calculate BMI.     06/21/2022   10:06 AM 01/06/2022    6:36 AM 12/29/2021    4:05 PM 09/20/2019    2:00 PM 09/17/2019   10:42 AM 05/31/2019    6:51 AM 05/29/2019   11:40 AM  Advanced Directives  Does Patient Have a Medical Advance Directive? No Yes Yes No No No No  Type of Advance Directive  Healthcare Power of [place]     Does patient want to make changes to medical advance directive?  No - Patient declined       Copy of [place] Chart?  No - copy requested       Would patient like information on creating a medical advance directive? No - Patient declined   No - Patient declined No - Patient declined No - Patient declined No - Patient declined    Current Medications (verified) Outpatient Encounter Medications as of 06/21/2022  Medication Sig   amLODipine (NORVASC) 2.5 MG tablet TAKE 1 TABLET BY MOUTH EVERY DAY   ELIQUIS 5 MG TABS tablet TAKE 1 TABLET BY MOUTH TWICE A DAY   losartan (COZAAR) 100 MG tablet TAKE 1 TABLET BY MOUTH EVERY DAY   metoprolol succinate (TOPROL-XL) 50 MG 24 hr tablet TAKE 1 TABLET BY MOUTH EVERY DAY   propafenone (RYTHMOL) 300 MG tablet TAKE 1  TABLET (300 MG TOTAL) BY MOUTH 2 (TWO) TIMES DAILY.   zolpidem (AMBIEN) 5 MG tablet Take 1 tablet (5 mg total) by mouth at bedtime.   Suvorexant (BELSOMRA) 15 MG TABS Prn insomnia . Helping to wean off aLorrin Mais(Patient not taking: Reported on 06/21/2022)   No facility-administered encounter medications on file as of 06/21/2022.    Allergies (verified) Codeine, Other, and Oxycodone   History: Past Medical History:  Diagnosis Date   Acute upper respiratory infections of unspecified site    Arthritis    Carotid bruit    [place]   Mild noted on [place]arotid   Cataracts, both eyes    Chronic insomnia    Diverticulosis    pt denies   Dysrhythmia    a fib   Fatty liver    Heart murmur    Has been told by an MD.   Hiatal hernia    Denies   History of colon polyps    Noted on Colonscopy   History of gallstones    Hyperlipidemia    Hypertension    Insomnia, controlled 01/01/2014   Internal hemorrhoids    Macular pucker, left eye    surgery planned 01-14-15   Menopause    noticed in early 50's   MR (mitral regurgitation) 09/10/2014   Mild, Noted on ECHO  Other acute sinusitis    Other malaise and fatigue    Paroxysmal atrial fibrillation (HCC)    on meds   Pneumonia    as a small child   PONV (postoperative nausea and vomiting)    Pulmonary hypertension ([place]) 09/10/2014   Mild, Noted on ECHO   Pure hypercholesterolemia    Restless leg syndrome    Routine general medical examination at a health care facility    Screening for lipoid disorders    Sleep apnea    no c-pap   Special screening for malignant neoplasms, colon    Uterine fibroid 09/19/1999   Noted on pelvis ultrasound   Past Surgical History:  Procedure Laterality Date   cardiolyte neg  6/06   cardiovascular stress MRI  12/20/06   afib   CHOLECYSTECTOMY     COLONOSCOPY     EYE SURGERY     cataract   Toric implant bil eyes   EYE SURGERY Right 07/29/2020   Mem Peel and Vit  - Dr. Zadie Rhine   KNEE ARTHROSCOPY     left   ORIF- L distal radius  9/07   left wrist   PARS PLANA VITRECTOMY W/ REPAIR OF MACULAR HOLE     left eye   torn meniscus     bil knees     TOTAL ANKLE ARTHROPLASTY Right 01/06/2022   Procedure: TOTAL ANKLE ARTHROPLASTY;  Surgeon: Wylene Simmer, MD;  Location: [facility];  Service: Orthopedics;  Laterality: Right;   TOTAL KNEE ARTHROPLASTY Left 05/31/2019   Procedure: LEFT TOTAL KNEE ARTHROPLASTY;  Surgeon: Mcarthur Rossetti, MD;  Location: [facility];  Service: Orthopedics;  Laterality: Left;   TOTAL KNEE ARTHROPLASTY Right 09/20/2019   Procedure: RIGHT TOTAL KNEE ARTHROPLASTY;  Surgeon: Mcarthur Rossetti, MD;  Location: [facility];  Service: Orthopedics;  Laterality: Right;   TUBAL LIGATION     UPPER GI ENDOSCOPY     Family History  Problem Relation Age of Onset   Breast cancer Mother    Heart disease Father    Colon cancer Cousin 60   Diabetes Other        cousins   Social History   Socioeconomic History   Marital status: Widowed    Spouse name: Sterling Big   Number of children: 3   Years of education: College   Highest education level: Not on file  Occupational History   Occupation: Clinical cytogeneticist    Employer: [employer]  Tobacco Use   Smoking status: Never   Smokeless tobacco: Never  Vaping Use   Vaping Use: Never used  Substance and Sexual Activity   Alcohol use: Yes    Comment: rarely   Drug use: No   Sexual activity: Not Currently    Birth control/protection: Post-menopausal  Other Topics Concern   Not on file  Social History Narrative   Patient is widowed. Patient has three adult children.   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    Patient is right-handed.   Patient drinks two cups of caffeine daily.   Patient has a college education.   Social Determinants of Health   Financial Resource Strain: Low Risk  (06/21/2022)   Overall Financial Resource Strain  (CARDIA)    Difficulty of Paying Living Expenses: Not hard at all  Food Insecurity: No Food Insecurity (06/21/2022)   Hunger Vital Sign    Worried About Running Out of Food in the Last Year: Never true  Ran Out of Food in the Last Year: Never true  Transportation Needs: No Transportation Needs (06/21/2022)   PRAPARE - Hydrologist (Medical): No    Lack of Transportation (Non-Medical): No  Physical Activity: Inactive (06/21/2022)   Exercise Vital Sign    Days of Exercise per Week: 0 days    Minutes of Exercise per Session: 0 min  Stress: No Stress Concern Present (06/21/2022)   [place]    Feeling of Stress : Not at all  Social Connections: Moderately Integrated (06/21/2022)   Social Connection and Isolation Panel [NHANES]    Frequency of Communication with Friends and Family: Three times a week    Frequency of Social Gatherings with Friends and Family: Once a week    Attends Religious Services: More than 4 times per year    Active Member of [employer] or Organizations: Yes    Attends Archivist Meetings: More than 4 times per year    Marital Status: Widowed    Tobacco Counseling Counseling given: Not Answered   Clinical Intake:  Pre-visit preparation completed: Yes  Pain : No/denies pain     Nutritional Risks: None Diabetes: No  How often do you need to have someone help you when you read instructions, pamphlets, or other written materials from your doctor or pharmacy?: 1 - Never  Diabetic?  no  Interpreter Needed?: No  Information entered by :: Leroy Kennedy LPN   Activities of Daily Living    06/21/2022   10:13 AM 01/06/2022    6:29 AM  In your present state of health, do you have any difficulty performing the following activities:  Hearing? 1 0  Vision? 0 0  Difficulty concentrating or making decisions? 0 0  Walking or climbing stairs? 1 0  Dressing or bathing?  0 0  Doing errands, shopping? 1   Preparing Food and eating ? N   Using the Toilet? N   In the past six months, have you accidently leaked urine? Y   Do you have problems with loss of bowel control? N   Managing your Medications? N   Managing your Finances? N     Patient Care Team: Eugenia Pancoast, FNP as PCP - General (Family Medicine) [place], Peter M, MD as PCP - Cardiology (Cardiology) Rutherford Guys, MD as Consulting Physician (Ophthalmology)  Indicate any recent Medical Services you may have received from other than [facility] providers in the past year (date may be approximate).     Assessment:   This is a routine wellness examination for Heath.  Hearing/Vision screen Hearing Screening - Comments:: Hearing bilateral Vision Screening - Comments:: Up to date [place] issues and exercise activities discussed: Current Exercise Habits: The patient does not participate in regular exercise at present   Goals Addressed             This Visit's Progress    Increase physical activity       Walking less pain free       Depression Screen    06/21/2022   10:13 AM 01/20/2016    1:45 PM  PHQ 2/9 Scores  PHQ - 2 Score 0 0    Fall Risk    06/21/2022   10:11 AM 07/22/2019   11:04 AM 09/27/2018    5:08 PM 05/26/2017    4:10 PM 01/20/2016    1:45 PM  Fall Risk   Falls in the past year?  1 1 0 No No  Comment   Emmi Telephone Survey: data to providers prior to load Emmi Telephone Survey: data to providers prior to load   Number falls in past yr: 1      Comment only when in cast off scooter has not fallen since out      Injury with Fall? 0      Follow up Falls evaluation completed;Education provided;Falls prevention discussed        FALL RISK PREVENTION PERTAINING TO THE HOME:  Any stairs in or around the home? Yes  If so, are there any without handrails? No  Home free of loose throw rugs in walkways, pet beds, electrical cords, etc? Yes  Adequate lighting in  your home to reduce risk of falls? Yes   ASSISTIVE DEVICES UTILIZED TO PREVENT FALLS:  Life alert? No  Use of a cane, walker or w/c? No  Grab bars in the bathroom? No  Shower chair or bench in shower? Yes  Elevated toilet seat or a handicapped toilet? Yes   TIMED UP AND GO:  Was the test performed? No .    Cognitive Function:    01/20/2016    1:56 PM  MMSE - Mini Mental State Exam  Orientation to time 5  Orientation to Place 5  Registration 3  Attention/ Calculation 5  Recall 3  Language- name 2 objects 0  Language- repeat 1  Language- follow 3 step command 3  Language- read & follow direction 1  Write a sentence 0  Copy design 0  Total score 26        06/21/2022   10:09 AM  6CIT Screen  What Year? 0 points  What month? 0 points  What time? 0 points  Count back from 20 2 points  Months in reverse 0 points  Repeat phrase 0 points  Total Score 2 points    Immunizations Immunization History  Administered Date(s) Administered   Influenza,inj,Quad PF,6+ Mos 09/06/2018   PFIZER(Purple Top)SARS-COV-2 Vaccination 11/27/2019, 12/18/2019   Pneumococcal Conjugate-13 01/20/2016   Pneumococcal Polysaccharide-23 12/22/2009   Zoster, Live 06/08/2011    TDAP status: Due, Education has been provided regarding the importance of this vaccine. Advised may receive this vaccine at local pharmacy or Health Dept. Aware to provide a copy of the vaccination record if obtained from local pharmacy or Health Dept. Verbalized acceptance and understanding.  Flu Vaccine status: Due, Education has been provided regarding the importance of this vaccine. Advised may receive this vaccine at local pharmacy or Health Dept. Aware to provide a copy of the vaccination record if obtained from local pharmacy or Health Dept. Verbalized acceptance and understanding.  Pneumococcal vaccine status: Up to date  Covid-19 vaccine status: Information provided on how to obtain vaccines.   Qualifies for  Shingles Vaccine? Yes   Zostavax completed Yes   Shingrix Completed?: No.    Education has been provided regarding the importance of this vaccine. Patient has been advised to call insurance company to determine out of pocket expense if they have not yet received this vaccine. Advised may also receive vaccine at local pharmacy or Health Dept. Verbalized acceptance and understanding.  Screening Tests Health Maintenance  Topic Date Due   Hepatitis C Screening  Never done   INFLUENZA VACCINE  06/07/2022   COVID-19 Vaccine (3 - Pfizer series) 07/07/2022 (Originally 02/12/2020)   Zoster Vaccines- Shingrix (1 of 2) 09/21/2022 (Originally 06/28/1994)   DEXA SCAN  06/22/2023 (Originally 06/28/2009)   COLONOSCOPY (Pts 45-39yr  Insurance coverage will need to be confirmed)  06/22/2023 (Originally 03/02/2019)   TETANUS/TDAP  01/19/2026 (Originally 06/29/1965)   Pneumonia Vaccine 91+ Years old  Completed   HPV VACCINES  Aged Out    Health Maintenance  Health Maintenance Due  Topic Date Due   Hepatitis C Screening  Never done   INFLUENZA VACCINE  06/07/2022    Colorectal cancer screening: No longer required.   Mammogram status: Completed  . Repeat every year  Bone Density declined  Lung Cancer Screening: (Low Dose CT Chest recommended if Age 38-80 years, 30 pack-year currently smoking OR have quit w/in 15years.) does not qualify.   Lung Cancer Screening Referral:   Additional Screening:  Hepatitis C Screening: does not qualify; Completed 2018  Vision Screening: Recommended annual ophthalmology exams for early detection of glaucoma and other disorders of the eye. Is the patient up to date with their annual eye exam?  Yes  Who is the provider or what is the name of the office in which the patient attends annual eye exams? [facility] If pt is not established with a provider, would they like to be referred to a provider to establish care? No .   Dental Screening: Recommended annual  dental exams for proper oral hygiene  Community Resource Referral / Chronic Care Management: CRR required this visit?  No   CCM required this visit?  No      Plan:     I have personally reviewed and noted the following in the patient's chart:   Medical and social history Use of alcohol, tobacco or illicit drugs  Current medications and supplements including opioid prescriptions.  Functional ability and status Nutritional status Physical activity Advanced directives List of other physicians Hospitalizations, surgeries, and ER visits in previous 12 months Vitals Screenings to include cognitive, depression, and falls Referrals and appointments  In addition, I have reviewed and discussed with patient certain preventive protocols, quality metrics, and best practice recommendations. A written personalized care plan for preventive services as well as general preventive health recommendations were provided to patient.     Leroy Kennedy, LPN   05/01/6388   Nurse Notes:

## 2022-07-07 ENCOUNTER — Other Ambulatory Visit: Payer: Self-pay | Admitting: Cardiology

## 2022-07-08 NOTE — Telephone Encounter (Signed)
Prescription refill request for Eliquis received. Indication:Afib Last office visit:4/23 Scr:0.9 Age: 76 Weight:84.9 kg  Prescription refilled

## 2022-08-22 ENCOUNTER — Other Ambulatory Visit: Payer: Self-pay | Admitting: Cardiology

## 2022-11-04 ENCOUNTER — Other Ambulatory Visit: Payer: Self-pay | Admitting: Neurology

## 2022-11-04 DIAGNOSIS — F5104 Psychophysiologic insomnia: Secondary | ICD-10-CM

## 2022-11-14 ENCOUNTER — Other Ambulatory Visit: Payer: Self-pay | Admitting: Cardiology

## 2022-12-15 ENCOUNTER — Other Ambulatory Visit: Payer: Self-pay | Admitting: Cardiology

## 2022-12-15 DIAGNOSIS — I480 Paroxysmal atrial fibrillation: Secondary | ICD-10-CM

## 2022-12-15 NOTE — Telephone Encounter (Signed)
Prescription refill request for Eliquis received. Indication: Afib Last office visit: 02/28/22 ([place]) Scr: 0.98 (02/28/22)  Age: 77 Weight: 84.9kg  Appropriate dose. Refill sent.

## 2023-02-16 ENCOUNTER — Other Ambulatory Visit: Payer: Self-pay | Admitting: Cardiology

## 2023-02-20 ENCOUNTER — Telehealth: Payer: Self-pay

## 2023-02-20 NOTE — Progress Notes (Unsigned)
PATIENT: [name] DOB: 1946-02-20  REASON FOR VISIT: follow up HISTORY FROM: patient  No chief complaint on file.    HISTORY OF PRESENT ILLNESS:   02/20/23 ALL:  [name] returns for follow up for insomnia. She was last seen by Dr Vickey Huger 07-Mar-2022. Ambien  QHS continued. Belsomra given to help wean Ambien. Referral placed to Elsie Lincoln, psychology, for CBT. Since,   02/16/2022 CD: [name] is a meanwhile 77 year old Caucasian female patient of our practice who has a history of obstructive sleep apnea with hypoxia her last sleep study was in March 2017, since then she has been followed for chronic insomnia.  She is able to fall asleep with a low-dose of Ambien 2.5 mg, and she has to get up 2-3 times each night to go to the bathroom but is able to go back to sleep.  She lives alone.  Her husband died in 03/07/18.  Current medication list was reviewed evaluated she is on Eliquis Cozaar, Toprol, Rythmol for atrial fibrillation, again Ambien she takes at a very low-dose and I am glad she does that.  She had 2 knee replacements and recently had an ankle surgery on the right.  So temporarily she was on pain medication and opiates worked to put he to sleep, but she only took it for 7 days post surgery. She fought nausea as well on opiates. I had in Mar 07, 2018 suggested switching to Trazodone, but that failed.  She would benefit from cognitive behavior therapy- mind is racing.  01/20/2021 ALL:  [name] returns today for follow up on chronic insomnia. She has not been able to wean Ambien. She continues 2.5mg  night. Occasionally she has to take another 2.5mg  to help get a few more hours of sleep. She has tried melatonin and valerian  But both were ineffective. She is getting about 6-7 hours of sleep every night. She lives alone. She does have a dog. She has neighbors that check in on her. She denies adverse effects.   Blood pressure is elevated, today. She continues metoprolol but no longer  taking amlodipine. She has atrial fib. She had an event last week where she was in atrial fib and was advised to take an extra dose of metoprolol which seemed to help. She has follow up with cardiology today. She has noted elevated BP readings. She has also gained some weight following her last knee surgery. She is having a hard time getting the weight back off.   07/23/2020 ALL:  [name] is a 77 y.o. female here today for follow up for insomnia.  She has continued to wean Ambien.  She is now taking 2.5 mg every night at bedtime.  She has had more difficulty with insomnia since decreasing Ambien dose.  She is motivated to discontinue Ambien as this is no longer covered by her insurance.  She has tried melatonin in the past but is willing to try this medication again.  No obvious adverse effects.  She is feeling well otherwise.  She remains very active at home.  She denies any significant concerns of anxiety or depression. She  01/20/2020 ALL: [name] is a 77 y.o. female here today for follow up for insomnia. She continues Ambien  at bedtime. She feels that Ambien is the only medication that seems to help. She does try to take less when she can. She is recovering from right knee replacement. She had a left knee replacement in July. She is recovering well.  She does admit to forgetting her BP meds this morning. BP is usually 140's/80's.      HISTORY: (copied from Dr Dohmeier's note on 07/22/2019)   9-14- 2020, RV with Demetri H. Ota, a 77 year old hearing impaired Caucasian female patient.  [name] lost her husband and is now living by herself, she has not to her knowledge contracted any COVID or was exposed to.  She has been followed here for years for obstructive sleep apnea with sleep hypoxemia and has also chronic insomnia.  She is not daytime excessively sleepy endorsing the Epworth score at 1 point today the fatigue severity at 18 points and the geriatric depression score  at 4 out of 15 points.  Her hearing aids no longer serve her well and therefore we have switched to a face shield on my part to that she can read my lips.medicare no longer paid for her machine. No longer on CPAP. Pharnacy is now [facility]- she wants Ambien refilled, but understands 5 mg is her age limit. She underwent knee replacement. Post surgery, left knee- &-24-2020.,she used oxycodone and tramadol, but couldn't  tolerate these, she lost appetit , was latently nauseated.  She wants to use Ambien instead.      07-18-2018, Rv with [name] -a 77 year old female patient who has become her husbands caretaker.   She endorses today the fatigue severity scale at 29 points, and her Epworth sleepiness score at 2 points.  The patient has suffered for a long time but is chronic insomnia, and underlying condition that has been treated with Ambien at 10 mg.  And she continues to have a.  Of wakefulness somewhere between 2 and 3 AM, this is also correlated to her Fitbit recording.  Her husband continues to receive chemotherapy, she had been successful in losing some weight but has been stable, certainly stress related.      01-15-2018, RV for this 77 year old caucasian married female with chronic insomnia- and who has learnt to live with it. She reports bedtime is around 11.30 pm, sleeping for about 7 hours total.  Her husband is on chemotherapy and tolerating it well, he takes naps in daytime, she doesn't.  She has lost weight again, and has not gained back over the last 12 month. She reports her cardiologist is happy with her, too. Her endorsing the Epworth score at only 2 points, FSS 23,    Today 12/27/16: [name] is a 77 year old female with a history of chronic insomnia. She returns today for follow-up. At her last visit she was advised that she did not have to use the CPAP as it was not offering her much benefit. She states that she's been using Ambien 10 mg at bedtime. She states that this is the only  medication that has offered her some benefit. She states that she still wakes up numerous times a night but she is able to go back to sleep. She reports on the rare occasion she will have a night that she cannot sleep at all. She was referred to Dr. Donell Beers but states that she never made an appointment. She states that her husband has had a lot of health issues and may being consumed with that for the last several months. She states that she understands that she has a lot of anxiety but she does not feel depressed. She returns today for an evaluation. And feel refreshed  We will follow HISTORY  01-20-2012 per Dr. Vickey Huger' notes: Levin Erp  Galyon is a 55 female and seen here in referral  from Dr. Dayton Martes for chronic insomnia. The patient will be evaluated for organic reasons of insomnia.    The patient was last seen on  01-20-12  and has lost a lot of weight in the meantime. After trazodone and Remeron did fail to produce satisfying results for now she will sometimes take a Benadryl or Tylenol PM to assist her with her sleep initiation. She thinks nocturia  was never the main problem. The patient states that she has lost a lot of weight intentionally and that this has helped certainly some of her other health issues including hypertension. She was diagnosed with atrial fibrillation in the past has been on chronic anticoagulation. She reported that topiramate caused her to be drowsy and daytime when taken in the morning for that reason we will change the intake time 2 PM this may assist with her sleepiness as well as was her headaches. It has also helped her to lose weight.  At bedtime routines were reviewed : the patient was to bed around 11 PM rises at 6:30 AM depending on how much sleep she got.  She did have amnestic spells from after some nights when using Ambien- but she sleeps all much better on Ambien but on the other medications.  The onset of her insomnia was related to the development of menopause but  she only has really hot flashes now- she uses Ambien chronically for 2 decades.   Her bedroom is described as quiet, cool and dark she shares a bedroom with her husband, who is very sick.      Interval history , 01-13-15 CD, Mrs. Viveiros is seen here following up on her sleep study from 01-29-14 the study showed a patient with normal heart rate some prolonged oxygen desaturation and very mild apnea. A problem for the patient was that she couldn't really sleep through the night she was 53 minutes awake during her sleep study. 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 Dr. Freida Busman had originally been referred to the patient for insomnia evaluation. Based on the hypoxemia and the mix of a mild sleep apnea , she could be invited the patient to come in for a CPAP titration split-night study. The patient is beginning cataracts and she has just been seen by optometry, she has been insomnic again. Testing was suspected to reveal also retinal disease and she was referred to Dr. Luciana Axe a retinal specialist. She has a macular pucker. Surgery is scheduled for tomorrow morning 01-14-15. She is in need of a refill for Ambien which he uses to treat her chronic insomnia. In spite of this the last week had been difficult for her I think because of anxiety in anticipation of the surgery. Her fatigue severity score is up to 40 points and her fatigue her Epworth sleepiness score is normal at 2 points.   Her sleep is not restorative, still. She will have an overnight pulse-oximetry after her surgery is done. I will schedule this though [facility].  Rv with NP or me in May-June 2016.   06-04-15, Mrs. Creswell is here status post cataract ectomy bilaterally and surgery to the retina. As I had quoted in my last visit she underwent a sleep study on 3-20 5-15. She continued to complain of nonrestorative not refreshing sleep. She feels however that she has started to manage better with that and her fatigue is no longer as  excessive. She endorsed the fatigue severity score of  31 points and the Epworth sleepiness score at 3 points. She's not likely to fall asleep unintended. She doesn't struggle with sleep attacks. In her geriatric depression score she endorsed only 1 point which is not indicative of suffering from clinical depression. She wears a fit bit, which records her pulse, she wore a ONO, and had 2 hours of low oxygen levels, she will need a referral to pulmonology for further evaluation or will proceed with a CPAP auto-titration, to see if correcting the mild apnea is an option. She needs to lose weight.    04-25-2016 Mrs. Court was last seen by mypractitioner Darrol Angel, and referred for a new CPAP titration on 01/27/2016. She was again diagnosed with insomnia and a low sleep efficiency of only 72.5% CPAP was explored from 5 through 8 cm water. And an air-fit P 10 nasal pillow in extra small size was used. She still reports that she feels restless and uncomfortable just was having anything in her face or attached to her body. And of course the PLM arousals were not addressed by CPAP. She would like to sleep on her side but feels compelled to sleep in supine position so that the mass does not get as long.   The compliance data since May 30 show 14 out of 15 days of use she used to machine over 4 hours for only 11 out of 15 days. A 74% compliance. The average user time is 4 hours and 56 minutes. The set pressure was 8 cm water with 3 cm EPR and her residual AHI is 2.9 there is a significant reduction in her apnea index. However her baseline AHI was not that high in 2015 with an AHI of 9. I think given that the patient feels no improvement in her daytime alertness and actually more bothered by the machine and given that she has a mild apnea to begin with I would allow her to discontinue the use of CPAP she could change to a dental device is snoring bothers her, but she denies. She will concentrate on weight  loss.   REVIEW OF SYSTEMS: Out of a complete 14 system review of symptoms, the patient complains only of the following symptoms, insomnia, right knee pain, weight gain and all other reviewed systems are negative.  ESS:4 FSS:43   ALLERGIES: Allergies  Allergen Reactions   Codeine Nausea And Vomiting   Other Nausea And Vomiting    general anesthesia   Oxycodone Nausea And Vomiting    HOME MEDICATIONS: Outpatient Medications Prior to Visit  Medication Sig Dispense Refill   amLODipine (NORVASC) 2.5 MG tablet TAKE 1 TABLET BY MOUTH EVERY DAY 90 tablet 3   ELIQUIS 5 MG TABS tablet TAKE 1 TABLET BY MOUTH TWICE A DAY 180 tablet 1   losartan (COZAAR) 100 MG tablet Take 1 tablet (100 mg total) by mouth daily. 90 tablet 0   metoprolol succinate (TOPROL-XL) 50 MG 24 hr tablet TAKE 1 TABLET BY MOUTH EVERY DAY 90 tablet 3   propafenone (RYTHMOL) 300 MG tablet TAKE 1 TABLET BY MOUTH 2 TIMES DAILY. 180 tablet 3   Suvorexant (BELSOMRA) 15 MG TABS Prn insomnia . Helping to wean off Remus Loffler (Patient not taking: Reported on 06/21/2022) 30 tablet 2   zolpidem (AMBIEN) 5 MG tablet TAKE ONE TABLET BY MOUTH AT BEDTIME 30 tablet 0   No facility-administered medications prior to visit.    PAST MEDICAL HISTORY: Past Medical History:  Diagnosis Date   Acute upper respiratory infections of unspecified  site    Arthritis    Carotid bruit    Carotid disease, bilateral (HCC) 01/31/2012   Mild noted on US carotid   Cataracts, both eyes    Chronic insomnia    Diverticulosis    pt denies   Dysrhythmia    a fib   Fatty liver    Heart murmur    Has been told by an MD.   Hiatal hernia    Denies   History of colon polyps    Noted on Colonscopy   History of gallstones    Hyperlipidemia    Hypertension    Insomnia, controlled 01/01/2014   Internal hemorrhoids    Macular pucker, left eye    surgery planned 01-14-15   Menopause    noticed in early 50's   MR (mitral regurgitation) 09/10/2014   Mild,  Noted on ECHO    Other acute sinusitis    Other malaise and fatigue    Paroxysmal atrial fibrillation (HCC)    on meds   Pneumonia    as a small child   PONV (postoperative nausea and vomiting)    Pulmonary hypertension (HCC) 09/10/2014   Mild, Noted on ECHO   Pure hypercholesterolemia    Restless leg syndrome    Routine general medical examination at a health care facility    Screening for lipoid disorders    Sleep apnea    no c-pap   Special screening for malignant neoplasms, colon    Uterine fibroid 09/19/1999   Noted on pelvis ultrasound    PAST SURGICAL HISTORY: Past Surgical History:  Procedure Laterality Date   cardiolyte neg  6/06   cardiovascular stress MRI  12/20/06   afib   CHOLECYSTECTOMY     COLONOSCOPY     EYE SURGERY     cataract   Toric implant bil eyes   EYE SURGERY Right 07/29/2020   Mem Peel and Vit - Dr. Luciana Axe   KNEE ARTHROSCOPY     left   ORIF- L distal radius  9/07   left wrist   PARS PLANA VITRECTOMY W/ REPAIR OF MACULAR HOLE     left eye   torn meniscus     bil knees     TOTAL ANKLE ARTHROPLASTY Right 01/06/2022   Procedure: TOTAL ANKLE ARTHROPLASTY;  Surgeon: Toni Arthurs, MD;  Location: [facility];  Service: Orthopedics;  Laterality: Right;   TOTAL KNEE ARTHROPLASTY Left 05/31/2019   Procedure: LEFT TOTAL KNEE ARTHROPLASTY;  Surgeon: Kathryne Hitch, MD;  Location: [facility];  Service: Orthopedics;  Laterality: Left;   TOTAL KNEE ARTHROPLASTY Right 09/20/2019   Procedure: RIGHT TOTAL KNEE ARTHROPLASTY;  Surgeon: Kathryne Hitch, MD;  Location: [facility];  Service: Orthopedics;  Laterality: Right;   TUBAL LIGATION     UPPER GI ENDOSCOPY      FAMILY HISTORY: Family History  Problem Relation Age of Onset   Breast cancer Mother    Heart disease Father    Colon cancer Cousin 39   Diabetes Other        cousins    SOCIAL HISTORY: Social History   Socioeconomic History   Marital status: Widowed    Spouse name:  Velda Shell   Number of children: 3   Years of education: College   Highest education level: Not on file  Occupational History   Occupation: Conservator, museum/gallery    Employer: [employer]  Tobacco Use   Smoking status: Never   Smokeless tobacco: Never  Vaping Use  Vaping Use: Never used  Substance and Sexual Activity   Alcohol use: Yes    Comment: rarely   Drug use: No   Sexual activity: Not Currently    Birth control/protection: Post-menopausal  Other Topics Concern   Not on file  Social History Narrative   Patient is widowed. Patient has three adult children.   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    Patient is right-handed.   Patient drinks two cups of caffeine daily.   Patient has a college education.   Social Determinants of Health   Financial Resource Strain: Low Risk  (06/21/2022)   Overall Financial Resource Strain (CARDIA)    Difficulty of Paying Living Expenses: Not hard at all  Food Insecurity: No Food Insecurity (06/21/2022)   Hunger Vital Sign    Worried About Running Out of Food in the Last Year: Never true    Ran Out of Food in the Last Year: Never true  Transportation Needs: No Transportation Needs (06/21/2022)   PRAPARE - Administrator, Civil Service (Medical): No    Lack of Transportation (Non-Medical): No  Physical Activity: Inactive (06/21/2022)   Exercise Vital Sign    Days of Exercise per Week: 0 days    Minutes of Exercise per Session: 0 min  Stress: No Stress Concern Present (06/21/2022)   [employer] of Occupational Health - Occupational Stress Questionnaire    Feeling of Stress : Not at all  Social Connections: Moderately Integrated (06/21/2022)   Social Connection and Isolation Panel [NHANES]    Frequency of Communication with Friends and Family: Three times a week    Frequency of Social Gatherings with Friends and Family: Once a week    Attends Religious Services: More than 4 times per year     Active Member of [employer] or Organizations: Yes    Attends Banker Meetings: More than 4 times per year    Marital Status: Widowed  Intimate Partner Violence: Not At Risk (06/21/2022)   Humiliation, Afraid, Rape, and Kick questionnaire    Fear of Current or Ex-Partner: No    Emotionally Abused: No    Physically Abused: No    Sexually Abused: No      PHYSICAL EXAM  There were no vitals filed for this visit.  There is no height or weight on file to calculate BMI.  Generalized: Well developed, in no acute distress  Cardiology: normal rate and rhythm, no murmur noted Respiratory: clear to auscultation bilaterally  Neurological examination  Mentation: Alert oriented to time, place, history taking. Follows all commands speech and language fluent Cranial nerve II-XII: Pupils were equal round reactive to light. Extraocular movements were full, visual field were full  Motor: The motor testing reveals 5 over 5 strength of all 4 extremities. Good symmetric motor tone is noted throughout.  Gait and station: Gait is normal.    DIAGNOSTIC DATA (LABS, IMAGING, TESTING) - I reviewed patient records, labs, notes, testing and imaging myself where available.     01/20/2016    1:56 PM  MMSE - Mini Mental State Exam  Orientation to time 5  Orientation to Place 5  Registration 3  Attention/ Calculation 5  Recall 3  Language- name 2 objects 0  Language- repeat 1  Language- follow 3 step command 3  Language- read & follow direction 1  Write a sentence 0  Copy design 0  Total score 26     Lab  Results  Component Value Date   WBC 7.3 02/28/2022   HGB 13.2 02/28/2022   HCT 39.3 02/28/2022   MCV 90 02/28/2022   PLT 272 02/28/2022      Component Value Date/Time   NA 141 02/28/2022 1054   K 4.9 02/28/2022 1054   CL 105 02/28/2022 1054   CO2 24 02/28/2022 1054   GLUCOSE 72 02/28/2022 1054   GLUCOSE 129 (H) 09/21/2019 0301   BUN 16 02/28/2022 1054   CREATININE 0.98  02/28/2022 1054   CREATININE 1.09 (H) 10/18/2016 1003   CALCIUM 8.9 02/28/2022 1054   PROT 6.7 07/22/2020 1028   ALBUMIN 3.9 07/22/2020 1028   AST 16 07/22/2020 1028   ALT 13 07/22/2020 1028   ALKPHOS 101 07/22/2020 1028   BILITOT 0.5 07/22/2020 1028   GFRNONAA 46 (L) 07/22/2020 1028   GFRAA 53 (L) 07/22/2020 1028   Lab Results  Component Value Date   CHOL 187 12/25/2018   HDL 61 12/25/2018   LDLCALC 105 (H) 12/25/2018   LDLDIRECT 137.0 08/10/2010   TRIG 107 12/25/2018   CHOLHDL 3.1 12/25/2018   No results found for: "HGBA1C" No results found for: "VITAMINB12" Lab Results  Component Value Date   TSH 1.680 02/28/2022       ASSESSMENT AND PLAN 77 y.o. year old female  has a past medical history of Acute upper respiratory infections of unspecified site, Arthritis, Carotid bruit, Carotid disease, bilateral (HCC) (01/31/2012), Cataracts, both eyes, Chronic insomnia, Diverticulosis, Dysrhythmia, Fatty liver, Heart murmur, Hiatal hernia, History of colon polyps, History of gallstones, Hyperlipidemia, Hypertension, Insomnia, controlled (01/01/2014), Internal hemorrhoids, Macular pucker, left eye, Menopause, MR (mitral regurgitation) (09/10/2014), Other acute sinusitis, Other malaise and fatigue, Paroxysmal atrial fibrillation (HCC), Pneumonia, PONV (postoperative nausea and vomiting), Pulmonary hypertension (HCC) (09/10/2014), Pure hypercholesterolemia, Restless leg syndrome, Routine general medical examination at a health care facility, Screening for lipoid disorders, Sleep apnea, Special screening for malignant neoplasms, colon, and Uterine fibroid (09/19/1999). here with significant  No diagnosis found.    Vinette continues to have difficulty with insomnia. We will continue Ambien up to 5mg  every night. Safety concerns discussed with using this medication. She will follow up with cardiology today to discuss elevated BP readings. She is asymptomatic. We will refer her to [facility] to assist in weight management.  Healthy lifestyle duration, well-balanced diet and regular exercise encouraged.  She will continue to follow-up closely with her primary care provider.  She will follow-up in 6 months, sooner if needed.  She verbalizes understanding and agreement with this plan.  No orders of the defined types were placed in this encounter.    No orders of the defined types were placed in this encounter.     I spent 15 minutes with the patient. 50% of this time was spent counseling and educating patient on plan of care and medications.    Shawnie Dapper, FNP-C 02/20/2023, 5:13 PM [facility] [address], [address] [place], [place] [address] [phone]

## 2023-02-20 NOTE — Patient Instructions (Signed)
Below is our plan:  We will continue Ambien 2.5mg  at bedtime.   Please make sure you are staying well hydrated. I recommend 50-60 ounces daily. Well balanced diet and regular exercise encouraged. Consistent sleep schedule with 6-8 hours recommended.   Please continue follow up with care team as directed.   Follow up with me in 6 months   You may receive a survey regarding today's visit. I encourage you to leave honest feed back as I do use this information to improve patient care. Thank you for seeing me today!

## 2023-02-20 NOTE — Telephone Encounter (Signed)
LVM for pt to bring her CPAP Machine.

## 2023-02-21 ENCOUNTER — Ambulatory Visit: Payer: PPO | Admitting: Family Medicine

## 2023-02-21 ENCOUNTER — Encounter: Payer: Self-pay | Admitting: Family Medicine

## 2023-02-21 DIAGNOSIS — F5104 Psychophysiologic insomnia: Secondary | ICD-10-CM

## 2023-02-21 MED ORDER — ZOLPIDEM TARTRATE 5 MG PO TABS
2.5000 mg | ORAL_TABLET | Freq: Every day | ORAL | 1 refills | Status: DC
Start: 2023-02-21 — End: 2023-08-14

## 2023-02-28 NOTE — Progress Notes (Unsigned)
Cardiology Clinic Note   Patient Name: [name] Date of Encounter: 03/02/2023  Primary Care Provider:  Mort Sawyers, FNP Primary Cardiologist:  Emily Swaziland, MD  Patient Profile    [name] 77 year old female presents the clinic today for follow-up evaluation of her atrial fibrillation and essential hypertension.  Past Medical History    Past Medical History:  Diagnosis Date   Acute upper respiratory infections of unspecified site    Arthritis    Carotid bruit    Carotid disease, bilateral 01/31/2012   Mild noted on US carotid   Cataracts, both eyes    Chronic insomnia    Diverticulosis    pt denies   Dysrhythmia    a fib   Fatty liver    Heart murmur    Has been told by an MD.   Hiatal hernia    Denies   History of colon polyps    Noted on Colonscopy   History of gallstones    Hyperlipidemia    Hypertension    Insomnia, controlled 01/01/2014   Internal hemorrhoids    Macular pucker, left eye    surgery planned 01-14-15   Menopause    noticed in early 50's   MR (mitral regurgitation) 09/10/2014   Mild, Noted on ECHO    Other acute sinusitis    Other malaise and fatigue    Paroxysmal atrial fibrillation    on meds   Pneumonia    as a small child   PONV (postoperative nausea and vomiting)    Pulmonary hypertension 09/10/2014   Mild, Noted on ECHO   Pure hypercholesterolemia    Restless leg syndrome    Routine general medical examination at a health care facility    Screening for lipoid disorders    Sleep apnea    no c-pap   Special screening for malignant neoplasms, colon    Uterine fibroid 09/19/1999   Noted on pelvis ultrasound   Past Surgical History:  Procedure Laterality Date   cardiolyte neg  6/06   cardiovascular stress MRI  12/20/06   afib   CHOLECYSTECTOMY     COLONOSCOPY     EYE SURGERY     cataract   Toric implant bil eyes   EYE SURGERY Right 07/29/2020   [facility] - Dr. Luciana Choi   KNEE ARTHROSCOPY      left   ORIF- L distal radius  9/07   left wrist   PARS PLANA VITRECTOMY W/ REPAIR OF MACULAR HOLE     left eye   torn meniscus     bil knees     TOTAL ANKLE ARTHROPLASTY Right 01/06/2022   Procedure: TOTAL ANKLE ARTHROPLASTY;  Surgeon: Emily Arthurs, MD;  Location: [facility];  Service: Orthopedics;  Laterality: Right;   TOTAL KNEE ARTHROPLASTY Left 05/31/2019   Procedure: LEFT TOTAL KNEE ARTHROPLASTY;  Surgeon: Emily Hitch, MD;  Location: [facility];  Service: Orthopedics;  Laterality: Left;   TOTAL KNEE ARTHROPLASTY Right 09/20/2019   Procedure: RIGHT TOTAL KNEE ARTHROPLASTY;  Surgeon: Emily Hitch, MD;  Location: [facility];  Service: Orthopedics;  Laterality: Right;   TUBAL LIGATION     UPPER GI ENDOSCOPY      Allergies  Allergies  Allergen Reactions   Codeine Nausea And Vomiting   Other Nausea And Vomiting    general anesthesia   Oxycodone Nausea And Vomiting    History of Present Illness    [name] has a  PMH of HTN, atrial fibrillation on Eliquis, carotid stenosis, sleep apnea, hiatal hernia, cholelithiasis, diverticular disease, rectal bleeding, shingles, hyperlipidemia, chronic insomnia restless leg syndrome, dyspnea, fatty liver disease, obesity, and anxiety with depression.  7/20 she underwent left total knee replacement and had right total knee replacement in November.  She has also had right total ankle arthroplasty by Dr. Victorino Choi.  She noted increased palpitations 10/15.  Her Rythmol was increased and she noted good response.  She reported an increase in dyspnea and underwent nuclear stress testing which was normal.  Her echocardiogram showed normal LV function with mild mitral valve regurgitation and mild pulmonary hypertension.  She was seen in follow-up by Dr. Swaziland on 02/28/2022.  She continued to do well from a rhythm standpoint.  She reported that if she would miss doses of Rythmol her heart rate would increased.  As long as she  was consistent with her medications her symptoms were well-managed.  She had CBC, BMP, magnesium, TSH drawn which were unremarkable.  She presents to the clinic today for follow-up evaluation and states she enjoys being outdoors and taking care of her 79 year old mare.  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  She does occasionally notice episodes of accelerated heartbeat.  However they last for a short time and dissipate without intervention.  She reports compliance with her apixaban and denies bleeding issues.  We reviewed the importance of high-fiber diet.  She would like to lose some weight.  We discussed intermittent fasting.  She will try this.  I will order a CBC and BMP today.  Will plan follow-up in 12 months.  Today she denies chest pain, shortness of breath, lower extremity edema, fatigue, palpitations, melena, hematuria, hemoptysis, diaphoresis, weakness, presyncope, syncope, orthopnea, and PND.    Home Medications    Prior to Admission medications   Medication Sig Start Date End Date Taking? Authorizing Provider  amLODipine (NORVASC) 2.5 MG tablet TAKE 1 TABLET BY MOUTH EVERY DAY 02/16/23   Emily Bunting, MD  ELIQUIS 5 MG TABS tablet TAKE 1 TABLET BY MOUTH TWICE A DAY 12/15/22   Choi, Emily M, MD  losartan (COZAAR) 100 MG tablet Take 1 tablet (100 mg total) by mouth daily. 11/14/22   Choi, Emily M, MD  metoprolol succinate (TOPROL-XL) 50 MG 24 hr tablet TAKE 1 TABLET BY MOUTH EVERY DAY 02/16/23   Emily Bunting, MD  propafenone (RYTHMOL) 300 MG tablet TAKE 1 TABLET BY MOUTH 2 TIMES DAILY. 08/23/22   Choi, Emily M, MD  zolpidem (AMBIEN) 5 MG tablet Take 0.5 tablets (2.5 mg total) by mouth at bedtime. 02/21/23   Choi, Amy, NP    Family History    Family History  Problem Relation Age of Onset   Breast cancer Mother    Heart disease Father    Colon cancer Cousin 32   Diabetes Other        cousins   She indicated that her mother is deceased. She indicated that her  father is deceased. She indicated that her sister is alive. She indicated that her brother is alive. She indicated that her cousin is alive. She indicated that the status of her other is unknown.  Social History    Social History   Socioeconomic History   Marital status: Widowed    Spouse name: Velda Shell   Number of children: 3   Years of education: College   Highest education level: Not on file  Occupational History   Occupation: Conservator, museum/gallery  Employer: [employer]  Tobacco Use   Smoking status: Never   Smokeless tobacco: Never  Vaping Use   Vaping Use: Never used  Substance and Sexual Activity   Alcohol use: Yes    Comment: rarely   Drug use: No   Sexual activity: Not Currently    Birth control/protection: Post-menopausal  Other Topics Concern   Not on file  Social History Narrative   Patient is widowed. Patient has three adult children.   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    Patient is right-handed.   Patient drinks two cups of caffeine daily.   Patient has a college education.   Social Determinants of Health   Financial Resource Strain: Low Risk  (06/21/2022)   Overall Financial Resource Strain (CARDIA)    Difficulty of Paying Living Expenses: Not hard at all  Food Insecurity: No Food Insecurity (06/21/2022)   Hunger Vital Sign    Worried About Running Out of Food in the Last Year: Never true    Ran Out of Food in the Last Year: Never true  Transportation Needs: No Transportation Needs (06/21/2022)   PRAPARE - Administrator, Civil Service (Medical): No    Lack of Transportation (Non-Medical): No  Physical Activity: Inactive (06/21/2022)   Exercise Vital Sign    Days of Exercise per Week: 0 days    Minutes of Exercise per Session: 0 min  Stress: No Stress Concern Present (06/21/2022)   [employer] of Occupational Health - Occupational Stress Questionnaire    Feeling of Stress : Not at all  Social  Connections: Moderately Integrated (06/21/2022)   Social Connection and Isolation Panel [NHANES]    Frequency of Communication with Friends and Family: Three times a week    Frequency of Social Gatherings with Friends and Family: Once a week    Attends Religious Services: More than 4 times per year    Active Member of [employer] or Organizations: Yes    Attends Banker Meetings: More than 4 times per year    Marital Status: Widowed  Intimate Partner Violence: Not At Risk (06/21/2022)   Humiliation, Afraid, Rape, and Kick questionnaire    Fear of Current or Ex-Partner: No    Emotionally Abused: No    Physically Abused: No    Sexually Abused: No     Review of Systems    General:  No chills, fever, night sweats or weight changes.  Cardiovascular:  No chest pain, dyspnea on exertion, edema, orthopnea, palpitations, paroxysmal nocturnal dyspnea. Dermatological: No rash, lesions/masses Respiratory: No cough, dyspnea Urologic: No hematuria, dysuria Abdominal:   No nausea, vomiting, diarrhea, bright red blood per rectum, melena, or hematemesis Neurologic:  No visual changes, wkns, changes in mental status. All other systems reviewed and are otherwise negative except as noted above.  Physical Exam    VS:  BP 134/84   Pulse 62   Ht 5\' 3"  (1.6 m)   Wt 189 lb 9.6 oz (86 kg)   SpO2 96%   BMI 33.59 kg/m  , BMI Body mass index is 33.59 kg/m. GEN: Well nourished, well developed, in no acute distress. HEENT: normal. Neck: Supple, no JVD, carotid bruits, or masses. Cardiac: RRR, no murmurs, rubs, or gallops. No clubbing, cyanosis, edema.  Radials/DP/PT 2+ and equal bilaterally.  Respiratory:  Respirations regular and unlabored, clear to auscultation bilaterally. GI: Soft, nontender, nondistended, BS + x 4. MS: no deformity or atrophy. Skin: warm  and dry, no rash. Neuro:  Strength and sensation are intact. Psych: Normal affect.  Accessory Clinical Findings    Recent Labs: No  results found for requested labs within last 365 days.   Recent Lipid Panel    Component Value Date/Time   CHOL 187 12/25/2018 1415   TRIG 107 12/25/2018 1415   HDL 61 12/25/2018 1415   CHOLHDL 3.1 12/25/2018 1415   CHOLHDL 3.8 09/24/2015 1214   VLDL 25 09/24/2015 1214   LDLCALC 105 (H) 12/25/2018 1415   LDLDIRECT 137.0 08/10/2010 0845         ECG personally reviewed by me today-sinus rhythm with first-degree AV block 61 bpm- No acute changes  Nuclear stress test 09/10/2014   Impression Exercise Capacity:  Good exercise capacity. BP Response:  Normal blood pressure response. Clinical Symptoms:  No significant symptoms noted. ECG Impression:  No significant ECG changes with Lexiscan. Comparison with Prior Nuclear Study: No previous nuclear study performed     Overall Impression:  Normal stress nuclear study.   Assessment & Plan   1.  Paroxysmal atrial fibrillation-heart rate today 62 bpm.  Reports compliance with apixaban.  Denies bleeding issues.  Denies trauma and falls. Continue metoprolol, Rythmol, apixaban Avoid triggers caffeine, chocolate, dehydration etc. CBC, BMP  Essential hypertension-BP today 134/84. Continue metoprolol, amlodipine, losartan Heart healthy low-sodium diet Increase physical activity as tolerated Maintain blood pressure log  OSA-tests inconclusive.  Waking up well rested. Sleeps on stomach Continue weight loss  Carotid stenosis-carotid Dopplers 01/31/2012, showed 0-39% bilateral carotid stenosis.  Denies episodes of lightheadedness, presyncope or syncope. High-fiber diet  Disposition: Follow-up with Dr. Swaziland or me in 12 months.   Thomasene Ripple. Fern Asmar NP-C     03/02/2023, 10:48 AM [facility] [address] Office [phone] Fax [phone]    I spent 14 minutes examining this patient, reviewing medications, and using patient centered shared decision making involving her cardiac care.  Prior  to her visit I spent greater than 20 minutes reviewing her past medical history,  medications, and prior cardiac tests.

## 2023-03-02 ENCOUNTER — Ambulatory Visit: Payer: PPO | Attending: General Practice | Admitting: General Practice

## 2023-03-02 ENCOUNTER — Encounter: Payer: Self-pay | Admitting: General Practice

## 2023-03-02 VITALS — BP 134/84 | HR 62 | Ht 63.0 in | Wt 189.6 lb

## 2023-03-02 DIAGNOSIS — I10 Essential (primary) hypertension: Secondary | ICD-10-CM

## 2023-03-02 DIAGNOSIS — I480 Paroxysmal atrial fibrillation: Secondary | ICD-10-CM

## 2023-03-02 DIAGNOSIS — G4733 Obstructive sleep apnea (adult) (pediatric): Secondary | ICD-10-CM | POA: Diagnosis not present

## 2023-03-02 DIAGNOSIS — I6523 Occlusion and stenosis of bilateral carotid arteries: Secondary | ICD-10-CM

## 2023-03-02 LAB — CBC
MCH: 30.9 pg (ref 26.6–33.0)
RBC: 4.57 x10E6/uL (ref 3.77–5.28)

## 2023-03-02 LAB — BASIC METABOLIC PANEL

## 2023-03-02 NOTE — Patient Instructions (Addendum)
Medication Instructions:  The current medical regimen is effective;  continue present plan and medications as directed. Please refer to the Current Medication list given to you today.  *If you need a refill on your cardiac medications before your next appointment, please call your pharmacy*  Lab Work: BMET AND CBC TODAY If you have labs (blood work) drawn today and your tests are completely normal, you will receive your results only by: MyChart Message (if you have MyChart) OR A paper copy in the mail If you have any lab test that is abnormal or we need to change your treatment, we will call you to review the results.  Testing/Procedures: NONE  Follow-Up: At [facility], you and your health needs are our priority.  As part of our continuing mission to provide you with exceptional heart care, we have created designated Provider Care Teams.  These Care Teams include your primary Cardiologist (physician) and Advanced Practice Providers (APPs -  Physician Assistants and Nurse Practitioners) who all work together to provide you with the care you need, when you need it.  Your next appointment:   1 year(s)  Provider:   Peter Swaziland, MD     Other Instructions

## 2023-03-03 LAB — CBC
Hematocrit: 41.1 % (ref 34.0–46.6)
Hemoglobin: 14.1 g/dL (ref 11.1–15.9)
MCHC: 34.3 g/dL (ref 31.5–35.7)
MCV: 90 fL (ref 79–97)
Platelets: 181 10*3/uL (ref 150–450)
RDW: 12.6 % (ref 11.7–15.4)
WBC: 6.7 10*3/uL (ref 3.4–10.8)

## 2023-03-03 LAB — BASIC METABOLIC PANEL
BUN: 20 mg/dL (ref 8–27)
Calcium: 8.9 mg/dL (ref 8.7–10.3)
Glucose: 111 mg/dL — ABNORMAL HIGH (ref 70–99)
Potassium: 4.5 mmol/L (ref 3.5–5.2)
Sodium: 141 mmol/L (ref 134–144)
eGFR: 53 mL/min/{1.73_m2} — ABNORMAL LOW (ref >59)

## 2023-03-30 ENCOUNTER — Encounter (INDEPENDENT_AMBULATORY_CARE_PROVIDER_SITE_OTHER): Payer: Medicare HMO | Admitting: Ophthalmology

## 2023-04-01 IMAGING — MG DIGITAL DIAGNOSTIC BILAT W/ TOMO W/ CAD
8 of 14 series · 8 of 40 positions shown · non-contrast
Comparison: Previous exam(s).

CLINICAL DATA: Recent left breast abscess. A small lump remains in
the area of the treated abscess. No current signs of infection.

EXAM:
DIGITAL DIAGNOSTIC BILATERAL MAMMOGRAM WITH TOMOSYNTHESIS AND CAD;
ULTRASOUND LEFT BREAST LIMITED
TECHNIQUE: Bilateral digital diagnostic mammography and breast tomosynthesis
was performed. The images were evaluated with computer-aided
detection.; Targeted ultrasound examination of the left breast was
performed.

[R CC synth-2D (1 of 2)]
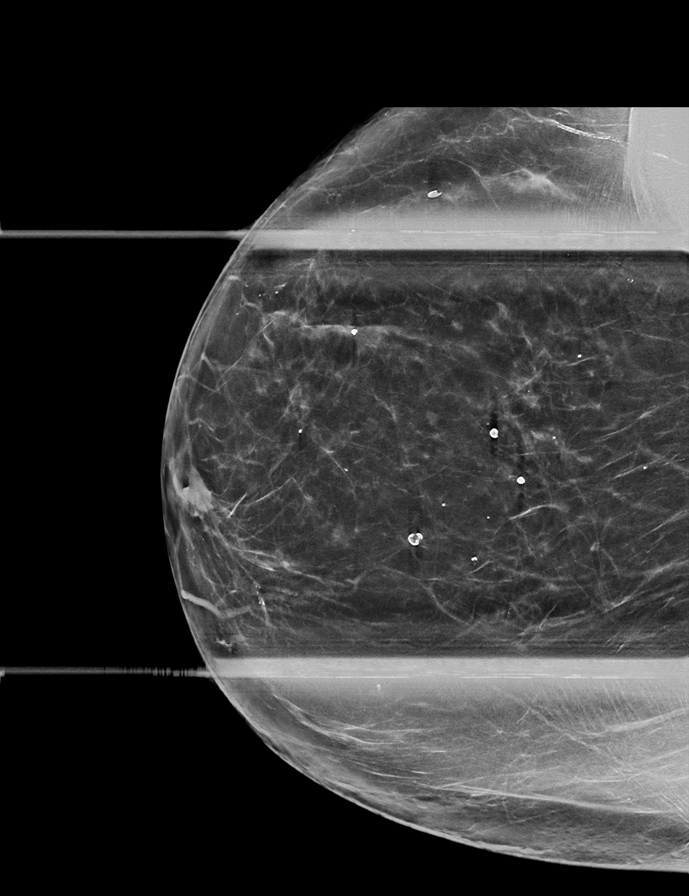

[R MLO synth-2D (1 of 2)]
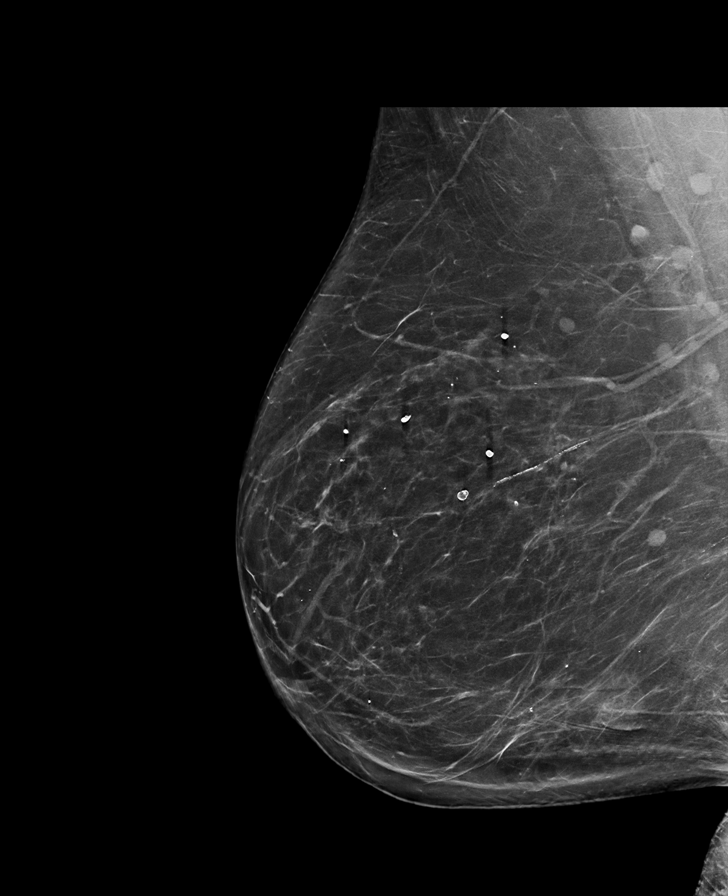

[L CC synth-2D (1 of 2)]
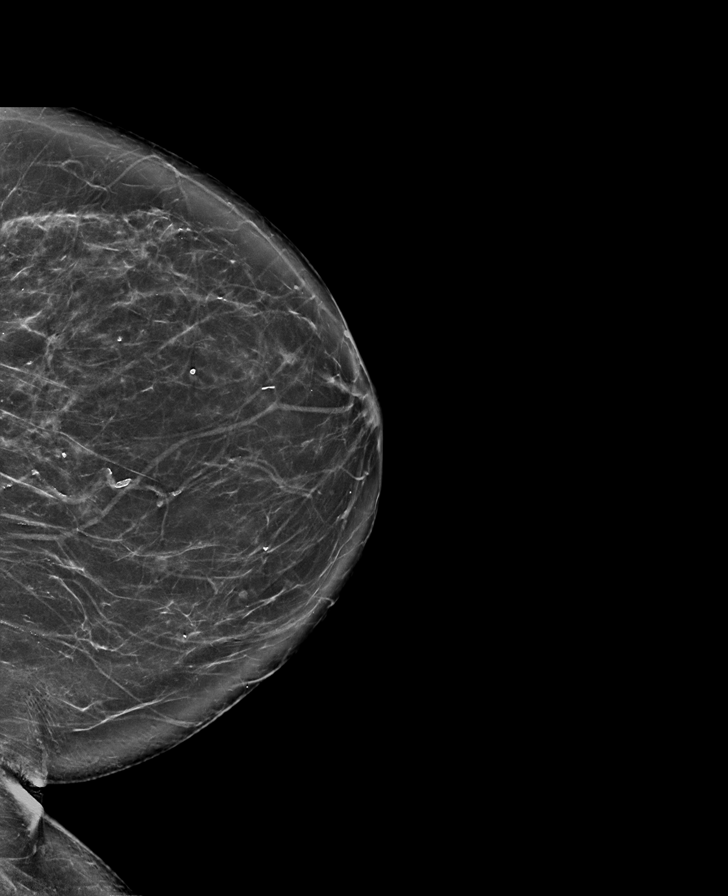

[R CC synth-2D (2 of 2)]
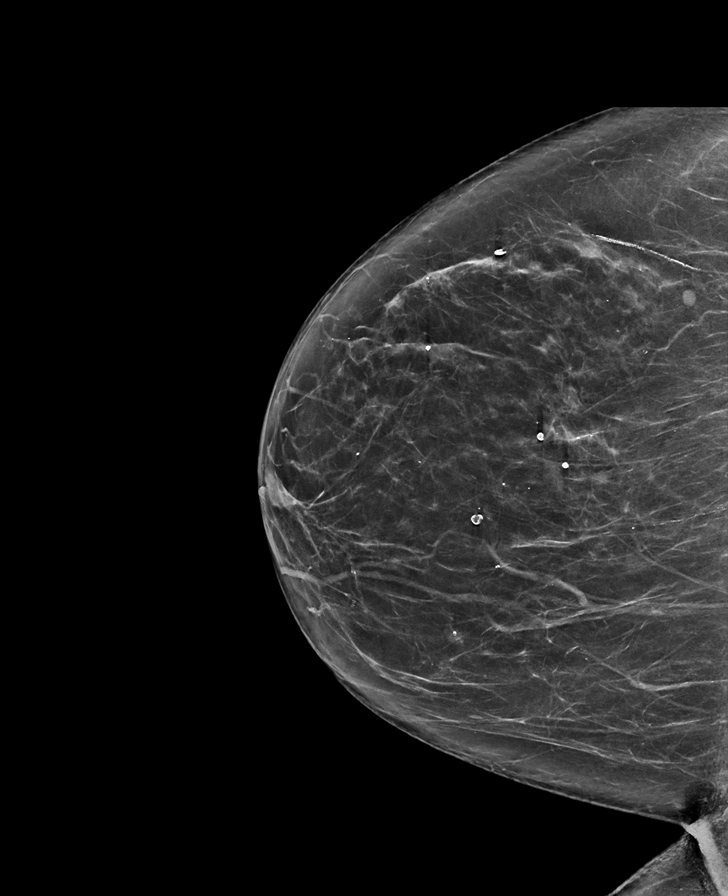

[L CC synth-2D (2 of 2)]
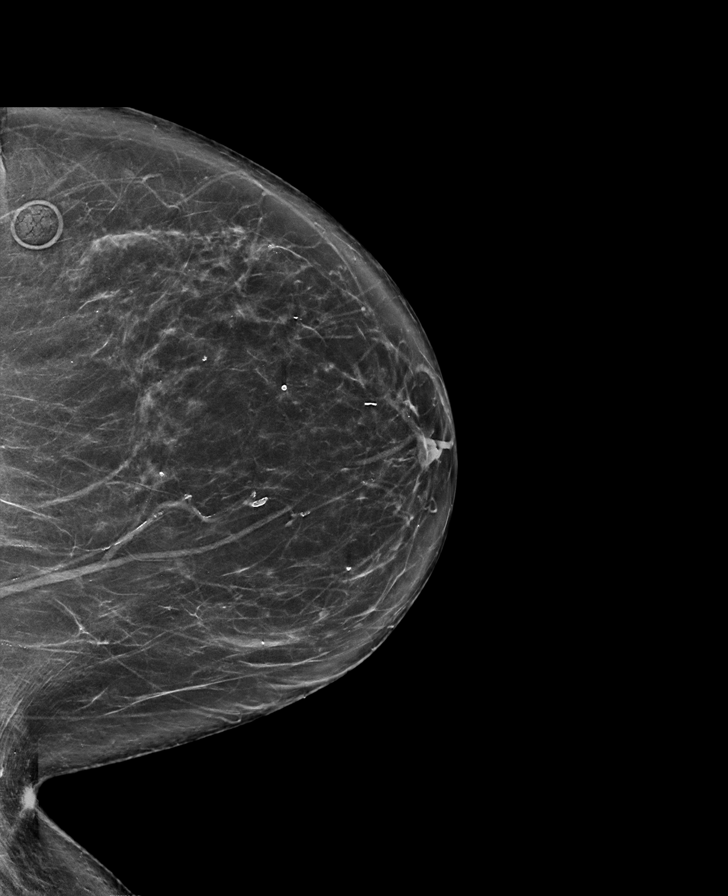

[L MLO synth-2D]
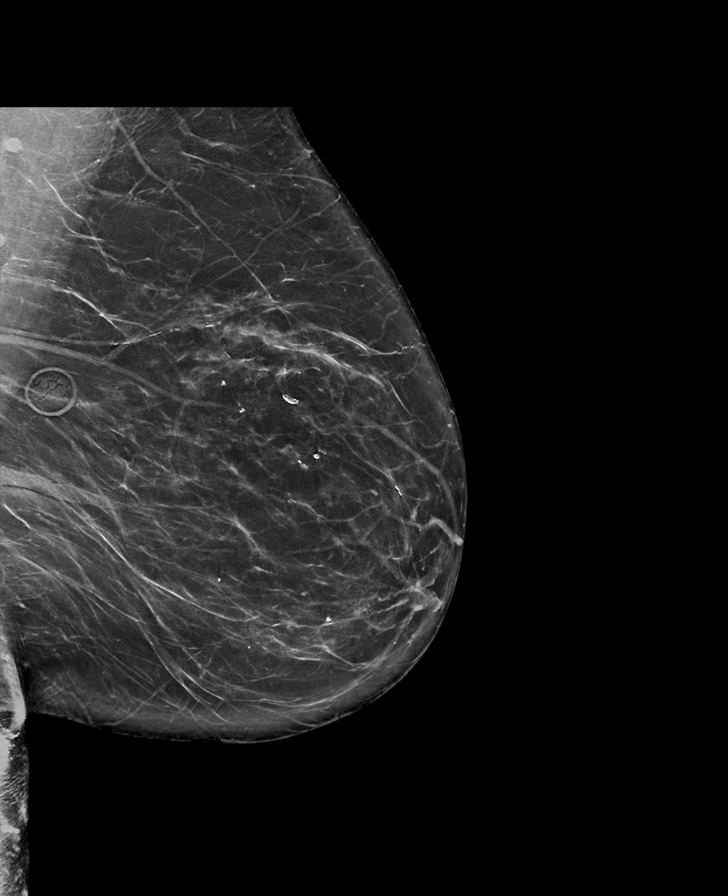

[R MLO synth-2D (2 of 2)]
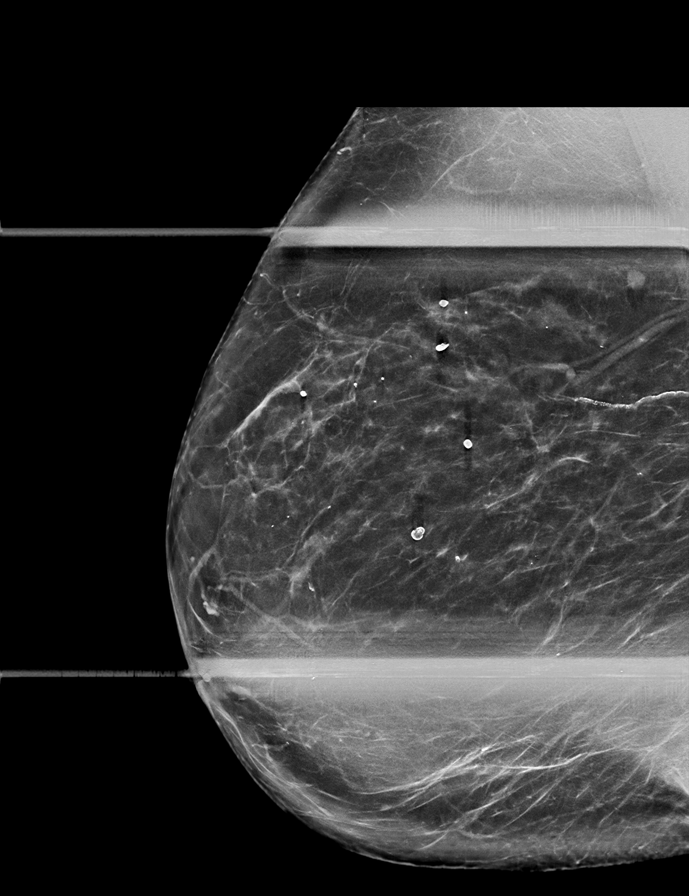

[R MLO tomo · tomo slice 41/80.0]
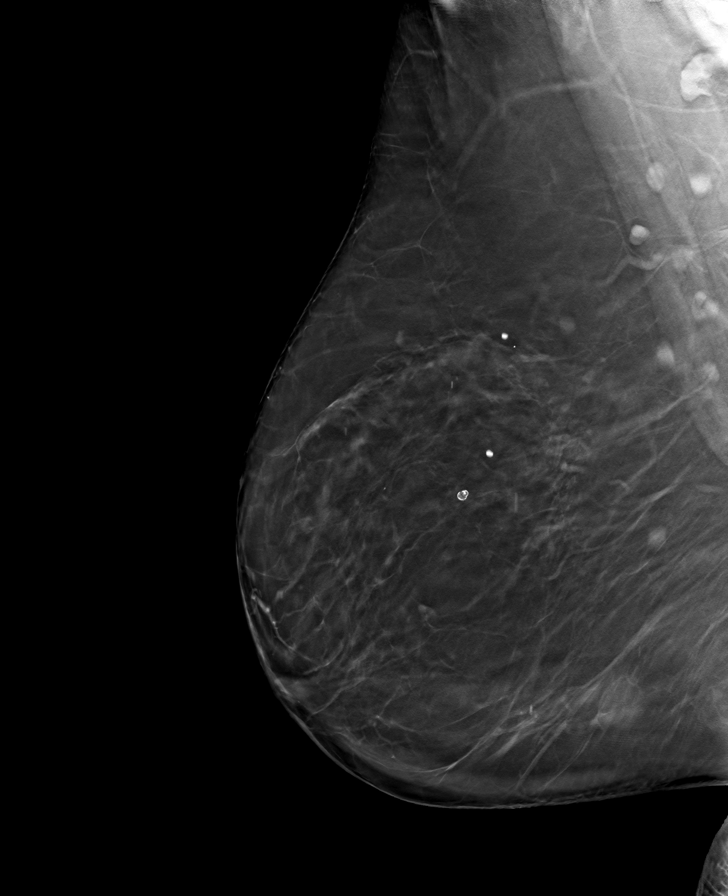

[8 of 40 positions shown; findings below may reference images not displayed]

ACR Breast Density Category b: There are scattered areas of
fibroglandular density.
FINDINGS: Asymmetry on the right is unchanged for many years, of no
significance. No suspicious masses, calcifications, or distortion
identified in either breast.

Targeted ultrasound is performed, showing mild skin thickening in
the region of the patient's recent symptoms. There is a small region
of hypoechogenicity within the mildly thickened skin.
IMPRESSION: The patient is feeling a skin based process. Given history, this is
consistent with sequela of previous infection. The patient may have
had a recently infected sebaceous cyst. However, findings are not
definitive today. No evidence of malignancy in the breast. No other
suspicious findings.

RECOMMENDATION:
Recommend annual screening mammography.

I have discussed the findings and recommendations with the patient.
If applicable, a reminder letter will be sent to the patient
regarding the next appointment.

BI-RADS CATEGORY  2: Benign.

## 2023-04-17 DIAGNOSIS — H35371 Puckering of macula, right eye: Secondary | ICD-10-CM | POA: Diagnosis not present

## 2023-04-17 DIAGNOSIS — G4733 Obstructive sleep apnea (adult) (pediatric): Secondary | ICD-10-CM | POA: Diagnosis not present

## 2023-05-25 ENCOUNTER — Telehealth: Payer: Self-pay | Admitting: Family

## 2023-05-25 NOTE — Telephone Encounter (Signed)
Left message to return call to our office.  

## 2023-05-25 NOTE — Telephone Encounter (Signed)
Which medication did she stop taking? Has she also called her cardiology office?   She was not sweating at all today? No chills? No lightheadedness? Did she feel she was going to faint? Any chest pain or sob? She is a cardiac patient. Is fatigue improving?  Is she hydrating with water well, it is has been hot so we need to make sure goal of 8 glasses daily.   If feeling lightheaded/dizzy/about to pass out and or chest pain or sob needs to go to ER. Otherwise can assess tomorrow at visit.

## 2023-05-25 NOTE — Telephone Encounter (Signed)
Pt scheduled an appointment 05/26/23

## 2023-05-25 NOTE — Telephone Encounter (Signed)
FYI: This call has been transferred to Access Nurse. Once the result note has been entered staff can address the message at that time.  Patient called in with the following symptoms:  Red Word:blood pressure Patient stated that her blood pressure has been dropping low. She stated that today it was 133/72 but now its 101/59, and the lowest it has been is 80/51. She stated that she has been tired.   Please advise at Mobile [phone] (mobile)  Message is routed to Provider Pool and [facility] Triage

## 2023-05-26 ENCOUNTER — Ambulatory Visit (INDEPENDENT_AMBULATORY_CARE_PROVIDER_SITE_OTHER): Payer: PPO | Admitting: Family

## 2023-05-26 ENCOUNTER — Encounter: Payer: Self-pay | Admitting: Family

## 2023-05-26 VITALS — BP 130/60 | HR 57 | Temp 98.1°F | Ht 63.0 in | Wt 189.0 lb

## 2023-05-26 DIAGNOSIS — R739 Hyperglycemia, unspecified: Secondary | ICD-10-CM | POA: Diagnosis not present

## 2023-05-26 DIAGNOSIS — R5383 Other fatigue: Secondary | ICD-10-CM | POA: Diagnosis not present

## 2023-05-26 DIAGNOSIS — I480 Paroxysmal atrial fibrillation: Secondary | ICD-10-CM

## 2023-05-26 DIAGNOSIS — G4719 Other hypersomnia: Secondary | ICD-10-CM

## 2023-05-26 DIAGNOSIS — I10 Essential (primary) hypertension: Secondary | ICD-10-CM

## 2023-05-26 DIAGNOSIS — N3941 Urge incontinence: Secondary | ICD-10-CM | POA: Diagnosis not present

## 2023-05-26 DIAGNOSIS — G4733 Obstructive sleep apnea (adult) (pediatric): Secondary | ICD-10-CM

## 2023-05-26 LAB — CBC WITH DIFFERENTIAL/PLATELET
MPV: 10.5 fL (ref 7.5–12.5)
RBC: 4.46 10*6/uL (ref 3.80–5.10)
WBC: 6.1 10*3/uL (ref 3.8–10.8)

## 2023-05-26 NOTE — Assessment & Plan Note (Signed)
Change to hypotension in last few weeks Stop losartan and amlodipine for the weekend, montior blood pressure if >150/160 systolic take 1/3 tablet losartan 50 mg once daily.

## 2023-05-26 NOTE — Progress Notes (Unsigned)
Established Patient Office Visit  Subjective:   Patient ID: [name], female    DOB: 08-27-1946  Age: 77 y.o. MRN: [id number]  CC:  Chief Complaint  Patient presents with   Low Blood Pressure Reading    HPI: TANAIYA KOLARIK is a 77 y.o. female presenting on 05/26/2023 for Low Blood Pressure Reading  HTN: pt actually with low blood pressure. She states two days ago she noticed she was feeling tired and sweaty, more so than usual, and her blood pressure was 101/59 and earlier that day was 80/51. She has stopped amlodipine 2.5 once daily , but states didn't notice any difference. Blood pressure readings at home Wednesday were as low as 80/51 highest 130/70. Thursday lowest 10/63 highest 137/68. Friday 127/78 and also 133/68. She thought she might be dehydrated so she increased her water intake. She does state that she will occasionally get some chest tightness, not everyday but a few times a week. Passes quickly is not a sharp pain but is heaviness at the moment. This started a few weeks ago. She also feels it might be anxiety in regards to her low blood pressure.   Afib: seeing cardiologist.   Denies urinary symptoms such as dysuria and frequency. Does report some increased urinary urgency with incontinence worse over the last few months.   She does also report decreased energy and fatigue, hard to get out of bed in the am and or in a chair when she has been sitting down for a bit.    Wt Readings from Last 3 Encounters:  05/26/23 189 lb (85.7 kg)  03/02/23 189 lb 9.6 oz (86 kg)  02/21/23 188 lb (85.3 kg)   Temp Readings from Last 3 Encounters:  05/26/23 98.1 F (36.7 C) (Temporal)  03/21/22 98.9 F (37.2 C)  01/06/22 97.9 F (36.6 C)   BP Readings from Last 3 Encounters:  05/26/23 130/60  03/02/23 134/84  02/21/23 (!) 150/64   Pulse Readings from Last 3 Encounters:  05/26/23 (!) 57  03/02/23 62  02/21/23 60        ROS: Negative unless specifically  indicated above in HPI.   Relevant past medical history reviewed and updated as indicated.   Allergies and medications reviewed and updated.   Current Outpatient Medications:    amLODipine (NORVASC) 2.5 MG tablet, TAKE 1 TABLET BY MOUTH EVERY DAY, Disp: 90 tablet, Rfl: 3   ELIQUIS 5 MG TABS tablet, TAKE 1 TABLET BY MOUTH TWICE A DAY, Disp: 180 tablet, Rfl: 1   losartan (COZAAR) 100 MG tablet, Take 1 tablet (100 mg total) by mouth daily., Disp: 90 tablet, Rfl: 0   metoprolol succinate (TOPROL-XL) 50 MG 24 hr tablet, TAKE 1 TABLET BY MOUTH EVERY DAY, Disp: 90 tablet, Rfl: 3   propafenone (RYTHMOL) 300 MG tablet, TAKE 1 TABLET BY MOUTH 2 TIMES DAILY., Disp: 180 tablet, Rfl: 3   zolpidem (AMBIEN) 5 MG tablet, Take 0.5 tablets (2.5 mg total) by mouth at bedtime., Disp: 45 tablet, Rfl: 1  Allergies  Allergen Reactions   Codeine Nausea And Vomiting   Other Nausea And Vomiting    general anesthesia   Oxycodone Nausea And Vomiting    Objective:   BP 130/60 (BP Location: Left Arm, Patient Position: Sitting, Cuff Size: Normal)   Pulse (!) 57   Temp 98.1 F (36.7 C) (Temporal)   Ht 5\' 3"  (1.6 m)   Wt 189 lb (85.7 kg)   SpO2 96%   BMI 33.48 kg/m  Physical Exam Constitutional:      General: She is not in acute distress.    Appearance: Normal appearance. She is normal weight. She is not ill-appearing, toxic-appearing or diaphoretic.  HENT:     Head: Normocephalic.     Nose: Nose normal.     Mouth/Throat:     Mouth: Mucous membranes are moist.  Cardiovascular:     Rate and Rhythm: Normal rate and regular rhythm.  Pulmonary:     Effort: Pulmonary effort is normal.     Breath sounds: Normal breath sounds.  Musculoskeletal:        General: Normal range of motion.  Neurological:     General: No focal deficit present.     Mental Status: She is alert and oriented to person, place, and time. Mental status is at baseline.  Psychiatric:        Mood and Affect: Mood normal.         Behavior: Behavior normal.        Thought Content: Thought content normal.        Judgment: Judgment normal.     Assessment & Plan:  OSA (obstructive sleep apnea) -     Ambulatory referral to Sleep Studies  Excessive daytime sleepiness -     Ambulatory referral to Sleep Studies -     TSH -     Vitamin B12 -     CBC with Differential/Platelet -     Basic metabolic panel  Urge incontinence Assessment & Plan: Worsening  Referral placed for urology for eval/treat  Orders: -     Ambulatory referral to Urology -     Urinalysis w microscopic + reflex cultur  Other fatigue -     TSH -     Vitamin B12 -     CBC with Differential/Platelet -     Basic metabolic panel  Hyperglycemia -     Hemoglobin A1c  Paroxysmal atrial fibrillation (HCC) Assessment & Plan: Stable Continue metoprolol 50 mg once daily  Physical exam with NSR    Essential hypertension Assessment & Plan: Change to hypotension in last few weeks Stop losartan and amlodipine for the weekend, montior blood pressure if >150/160 systolic take 1/3 tablet losartan 50 mg once daily.    Obstructive sleep apnea Assessment & Plan: Noncompliant with cpap  With daytime fatigue, ordering new sleep study.  Did d/w pt importance of using cpap if again with osa findings.  May contribute to daytime fatigue.   Other orders -     Urine Culture -     REFLEXIVE URINE CULTURE     Follow up plan: Return in about 2 weeks (around 06/09/2023) for if no improvement in symptoms.  Mort Sawyers, FNP

## 2023-05-26 NOTE — Assessment & Plan Note (Signed)
Stable Continue metoprolol 50 mg once daily  Physical exam with NSR

## 2023-05-26 NOTE — Patient Instructions (Addendum)
  A referral was placed today for urology, also for sleep study (the sleep study will come to your home) Please let us know if you have not heard back within 2 weeks about the referral.  Stop losartan and amlodipine for the weekend, monitor blood pressure. If systolic over 150/160 take losartan 50 mg (1/2 tablet) and monitor blood pressure when having symptoms.   Red flags: if you are experiencing sudden onset shortness of breath, worsening chest pain, shakiness, and or clammy/lightheaded go to ER or call 911.   Reach out to cardiologist to get in to be evaluated as well.    Regards,   Mort Sawyers FNP-C

## 2023-05-27 LAB — CULTURE INDICATED

## 2023-05-27 LAB — HEMOGLOBIN A1C
Hgb A1c MFr Bld: 6.3 % of total Hgb — ABNORMAL HIGH (ref <5.7)
eAG (mmol/L): 7.4 mmol/L

## 2023-05-27 LAB — URINALYSIS W MICROSCOPIC + REFLEX CULTURE
Bilirubin Urine: NEGATIVE
Ketones, ur: NEGATIVE
Nitrites, Initial: NEGATIVE
Protein, ur: NEGATIVE
Squamous Epithelial / HPF: NONE SEEN /HPF (ref <=5)

## 2023-05-27 LAB — CBC WITH DIFFERENTIAL/PLATELET
Absolute Monocytes: 561 cells/uL (ref 200–950)
Basophils Absolute: 61 cells/uL (ref 0–200)
Basophils Relative: 1 %
Lymphs Abs: 2385 cells/uL (ref 850–3900)
Monocytes Relative: 9.2 %
Platelets: 202 10*3/uL (ref 140–400)
RDW: 12.6 % (ref 11.0–15.0)
Total Lymphocyte: 39.1 %

## 2023-05-27 LAB — VITAMIN B12: Vitamin B-12: 265 pg/mL (ref 200–1100)

## 2023-05-27 LAB — BASIC METABOLIC PANEL
Calcium: 8.8 mg/dL (ref 8.6–10.4)
Potassium: 4.8 mmol/L (ref 3.5–5.3)

## 2023-05-27 LAB — TSH: TSH: 1.61 mIU/L (ref 0.40–4.50)

## 2023-05-28 LAB — BASIC METABOLIC PANEL
BUN/Creatinine Ratio: 27 (calc) — ABNORMAL HIGH (ref 6–22)
BUN: 29 mg/dL — ABNORMAL HIGH (ref 7–25)
CO2: 24 mmol/L (ref 20–32)
Chloride: 107 mmol/L (ref 98–110)
Creat: 1.07 mg/dL — ABNORMAL HIGH (ref 0.60–1.00)
Glucose, Bld: 111 mg/dL — ABNORMAL HIGH (ref 65–99)
Sodium: 138 mmol/L (ref 135–146)

## 2023-05-28 LAB — CBC WITH DIFFERENTIAL/PLATELET
Eosinophils Absolute: 153 cells/uL (ref 15–500)
Eosinophils Relative: 2.5 %
HCT: 40.3 % (ref 35.0–45.0)
Hemoglobin: 13.6 g/dL (ref 11.7–15.5)
MCH: 30.5 pg (ref 27.0–33.0)
MCHC: 33.7 g/dL (ref 32.0–36.0)
MCV: 90.4 fL (ref 80.0–100.0)
Neutro Abs: 2940 cells/uL (ref 1500–7800)
Neutrophils Relative %: 48.2 %

## 2023-05-28 LAB — URINE CULTURE
MICRO NUMBER:: 15225054
SPECIMEN QUALITY:: ADEQUATE

## 2023-05-28 LAB — URINALYSIS W MICROSCOPIC + REFLEX CULTURE
Bacteria, UA: NONE SEEN /HPF
Glucose, UA: NEGATIVE
Hgb urine dipstick: NEGATIVE
Hyaline Cast: NONE SEEN /LPF
RBC / HPF: NONE SEEN /HPF (ref 0–2)
Specific Gravity, Urine: 1.021 (ref 1.001–1.035)
pH: 5.5 (ref 5.0–8.0)

## 2023-05-28 LAB — HEMOGLOBIN A1C: Mean Plasma Glucose: 134 mg/dL

## 2023-05-29 NOTE — Assessment & Plan Note (Signed)
Noncompliant with cpap  With daytime fatigue, ordering new sleep study.  Did d/w pt importance of using cpap if again with osa findings.  May contribute to daytime fatigue.

## 2023-05-29 NOTE — Assessment & Plan Note (Signed)
Worsening  Referral placed for urology for eval/treat

## 2023-05-29 NOTE — Telephone Encounter (Addendum)
Pt was seen on 05/26/23;

## 2023-05-30 ENCOUNTER — Encounter: Payer: Self-pay | Admitting: *Deleted

## 2023-06-05 ENCOUNTER — Telehealth: Payer: Self-pay | Admitting: Family

## 2023-06-05 NOTE — Telephone Encounter (Signed)
Patient contacted the office regarding urology referral, states the office she was referred to is out to September for appointments. She was asking to be referred to an office in [place] to maybe be seen quicker. Please advise patient if needed, thanks.

## 2023-06-06 NOTE — Telephone Encounter (Signed)
Pt called back returning Joellen's call. Told pt Dugal's response. Pt stated she'd call back with info. Call back # [phone]

## 2023-06-06 NOTE — Telephone Encounter (Signed)
September is actually a good time frame to get in with a specialist however if pt would like, she can call around and if she finds someone that can see her sooner let me know the information and I will update the referral to her location preference.

## 2023-06-06 NOTE — Telephone Encounter (Signed)
Left message to return call to our office.  

## 2023-06-15 ENCOUNTER — Other Ambulatory Visit: Payer: Self-pay | Admitting: Cardiology

## 2023-07-02 ENCOUNTER — Emergency Department: Payer: PPO

## 2023-07-02 ENCOUNTER — Other Ambulatory Visit: Payer: Self-pay

## 2023-07-02 ENCOUNTER — Emergency Department
Admission: EM | Admit: 2023-07-02 | Discharge: 2023-07-02 | Disposition: A | Discharge status: Home / Self Care | Payer: PPO | Attending: Emergency Medicine | Admitting: Emergency Medicine

## 2023-07-02 DIAGNOSIS — W130XXA Fall from, out of or through balcony, initial encounter: Secondary | ICD-10-CM | POA: Diagnosis not present

## 2023-07-02 DIAGNOSIS — S93492A Sprain of other ligament of left ankle, initial encounter: Secondary | ICD-10-CM

## 2023-07-02 DIAGNOSIS — S93402A Sprain of unspecified ligament of left ankle, initial encounter: Secondary | ICD-10-CM | POA: Diagnosis not present

## 2023-07-02 DIAGNOSIS — S199XXA Unspecified injury of neck, initial encounter: Secondary | ICD-10-CM | POA: Diagnosis not present

## 2023-07-02 DIAGNOSIS — S51012A Laceration without foreign body of left elbow, initial encounter: Secondary | ICD-10-CM | POA: Insufficient documentation

## 2023-07-02 DIAGNOSIS — Z23 Encounter for immunization: Secondary | ICD-10-CM | POA: Insufficient documentation

## 2023-07-02 DIAGNOSIS — M50322 Other cervical disc degeneration at C5-C6 level: Secondary | ICD-10-CM | POA: Diagnosis not present

## 2023-07-02 DIAGNOSIS — S0990XA Unspecified injury of head, initial encounter: Secondary | ICD-10-CM | POA: Insufficient documentation

## 2023-07-02 DIAGNOSIS — W19XXXA Unspecified fall, initial encounter: Secondary | ICD-10-CM

## 2023-07-02 DIAGNOSIS — I672 Cerebral atherosclerosis: Secondary | ICD-10-CM | POA: Diagnosis not present

## 2023-07-02 DIAGNOSIS — M47812 Spondylosis without myelopathy or radiculopathy, cervical region: Secondary | ICD-10-CM | POA: Diagnosis not present

## 2023-07-02 DIAGNOSIS — Z7901 Long term (current) use of anticoagulants: Secondary | ICD-10-CM | POA: Diagnosis not present

## 2023-07-02 DIAGNOSIS — M85872 Other specified disorders of bone density and structure, left ankle and foot: Secondary | ICD-10-CM | POA: Diagnosis not present

## 2023-07-02 DIAGNOSIS — M25572 Pain in left ankle and joints of left foot: Secondary | ICD-10-CM | POA: Diagnosis not present

## 2023-07-02 DIAGNOSIS — M50321 Other cervical disc degeneration at C4-C5 level: Secondary | ICD-10-CM | POA: Diagnosis not present

## 2023-07-02 MED ORDER — TETANUS-DIPHTH-ACELL PERTUSSIS 5-2.5-18.5 LF-MCG/0.5 IM SUSY
0.5000 mL | PREFILLED_SYRINGE | Freq: Once | INTRAMUSCULAR | Status: AC
  Administered 2023-07-02: 0.5 mL via INTRAMUSCULAR
  Filled 2023-07-02: qty 0.5

## 2023-07-02 MED ORDER — LIDOCAINE HCL (PF) 1 % IJ SOLN
5.0000 mL | Freq: Once | INTRAMUSCULAR | Status: AC
  Administered 2023-07-02: 5 mL
  Filled 2023-07-02: qty 5

## 2023-07-02 NOTE — ED Provider Notes (Signed)
[place] EMERGENCY DEPARTMENT AT [facility] Provider Note   CSN: [id number] Arrival date & time: 07/02/23  1752     History  Chief Complaint  Patient presents with   Marletta Lor    [name] is a 77 y.o. female presents to the emergency department for evaluation of a fall.  She fell forwards off the porch just prior to arrival.  She hit her face and her head, felt some pain in her left ankle.  She also suffered a laceration to the left elbow.  She denies any LOC nausea or vomiting but does have a slight headache.  She denies any vision changes, neck pain numbness tingling radicular symptoms.  She has some soreness to the lateral aspect of the left ankle along mild swelling.  She has a superficial abrasion to the left knee but denies any knee pain.  She has been ambulatory since the incident.  She denies any back pain chest pain or shortness of breath or abdominal pain.  Her tetanus status is unknown.  HPI     Home Medications Prior to Admission medications   Medication Sig Start Date End Date Taking? Authorizing Provider  amLODipine (NORVASC) 2.5 MG tablet TAKE 1 TABLET BY MOUTH EVERY DAY 02/16/23   Lewayne Bunting, MD  ELIQUIS 5 MG TABS tablet TAKE 1 TABLET BY MOUTH TWICE A DAY 12/15/22   [place], Peter M, MD  losartan (COZAAR) 100 MG tablet TAKE 1 TABLET BY MOUTH EVERY DAY 06/15/23   [place], Peter M, MD  metoprolol succinate (TOPROL-XL) 50 MG 24 hr tablet TAKE 1 TABLET BY MOUTH EVERY DAY 02/16/23   Lewayne Bunting, MD  propafenone (RYTHMOL) 300 MG tablet TAKE 1 TABLET BY MOUTH 2 TIMES DAILY. 08/23/22   [place], Peter M, MD  zolpidem (AMBIEN) 5 MG tablet Take 0.5 tablets (2.5 mg total) by mouth at bedtime. 02/21/23   Lomax, Amy, NP      Allergies    Codeine, Other, and Oxycodone    Review of Systems   Review of Systems  Physical Exam Updated Vital Signs BP (!) 185/91   Pulse (!) 57   Temp 97.9 F (36.6 C) (Oral)   Resp 18   Ht 5\' 3"  (1.6 m)   Wt 83.9 kg   SpO2  96%   BMI 32.77 kg/m  Physical Exam Constitutional:      Appearance: She is well-developed.  HENT:     Head: Normocephalic and atraumatic.  Eyes:     Extraocular Movements: Extraocular movements intact.     Conjunctiva/sclera: Conjunctivae normal.     Pupils: Pupils are equal, round, and reactive to light.  Cardiovascular:     Rate and Rhythm: Normal rate.  Pulmonary:     Effort: Pulmonary effort is normal. No respiratory distress.  Musculoskeletal:        General: Normal range of motion.     Cervical back: Normal range of motion.     Comments: Ambulatory able to stand with no assistance.  Left ankle tender along the lateral malleolus and soft tissues along the ATFL region but no medial ankle tenderness.  No tenderness along the proximal tib-fib region.  Both hips and knees move well with no ligamentous laxity or pain with hip internal or external rotation.  She is nontender throughout the cervical thoracic or lumbar spinous process.  She has no shoulder tenderness bilaterally.  Left elbow moves well but has had 3-1/2 cm laceration along the lateral distal humerus.  No visible  or palpable foreign body.  No pain with elbow range of motion.  Active elbow flexion extension is intact.  Skin:    General: Skin is warm.     Findings: No rash.  Neurological:     General: No focal deficit present.     Mental Status: She is alert and oriented to person, place, and time. Mental status is at baseline.  Psychiatric:        Behavior: Behavior normal.        Thought Content: Thought content normal.     ED Results / Procedures / Treatments   Labs (all labs ordered are listed, but only abnormal results are displayed) Labs Reviewed - No data to display  EKG None  Radiology CT HEAD WO CONTRAST ( )  Result Date: 07/02/2023 CLINICAL DATA:  Head trauma, minor (Age >= 65y) fall, on Eliquis EXAM: CT HEAD WITHOUT CONTRAST TECHNIQUE: Contiguous axial images were obtained from the base of the skull  through the vertex without intravenous contrast. RADIATION DOSE REDUCTION: This exam was performed according to the departmental dose-optimization program which includes automated exposure control, adjustment of the mA and/or kV according to patient size and/or use of iterative reconstruction technique. COMPARISON:  None Available. FINDINGS: Brain: No intracranial hemorrhage, mass effect, or midline shift. No hydrocephalus. The basilar cisterns are patent. No evidence of territorial infarct or acute ischemia. No extra-axial or intracranial fluid collection. Vascular: Atherosclerosis of skullbase vasculature without hyperdense vessel or abnormal calcification. Skull: No fracture or focal lesion. Sinuses/Orbits: Paranasal sinuses and mastoid air cells are clear. The visualized orbits are unremarkable. Other: No confluent scalp hematoma. IMPRESSION: No acute intracranial abnormality. No skull fracture. Electronically Signed   By: Narda Rutherford M.D.   On: 07/02/2023 19:25   CT Cervical Spine Wo Contrast  Result Date: 07/02/2023 CLINICAL DATA:  Neck trauma (Age >= 65y) fall Fall off porch to ground. EXAM: CT CERVICAL SPINE WITHOUT CONTRAST TECHNIQUE: Multidetector CT imaging of the cervical spine was performed without intravenous contrast. Multiplanar CT image reconstructions were also generated. RADIATION DOSE REDUCTION: This exam was performed according to the departmental dose-optimization program which includes automated exposure control, adjustment of the mA and/or kV according to patient size and/or use of iterative reconstruction technique. COMPARISON:  None Available. FINDINGS: Alignment: No traumatic subluxation. Skull base and vertebrae: No acute fracture. Vertebral body heights are maintained. The dens and skull base are intact. Chronic fragmentation adjacent to the superior dens. Soft tissues and spinal canal: No prevertebral fluid or swelling. No visible canal hematoma. Disc levels: Moderate  degenerative disc disease C4-C5 and C5-C6, mild degenerative disc disease C6-C7. Moderate multilevel facet hypertrophy. Upper chest: No acute findings. Other: None. 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 Electronically Signed   By: Narda Rutherford M.D.   On: 07/02/2023 19:21   DG Ankle Complete Left  Result Date: 07/02/2023 CLINICAL DATA:  Pain after fall EXAM: LEFT ANKLE COMPLETE - 3 VIEW COMPARISON:  None Available. FINDINGS: Soft tissue swelling about the ankle. Vascular calcifications. Osteopenia. No fracture or dislocation. Mild degenerative changes diffusely. IMPRESSION: Chronic changes. Soft tissue swelling. No acute osseous abnormality. Electronically Signed   By: Karen Kays M.D.   On: 07/02/2023 19:05    Procedures .Marland KitchenLaceration Repair  Date/Time: 07/02/2023 8:04 PM  Performed by: Evon Slack, PA-C Authorized by: Evon Slack, PA-C   Consent:    Consent obtained:  Verbal   Consent given by:  Patient Universal protocol:  Patient identity confirmed:  Verbally with patient Anesthesia:    Anesthesia method:  Local infiltration   Local anesthetic:  Lidocaine 1% w/o epi Laceration details:    Location:  Shoulder/arm   Shoulder/arm location:  L elbow   Length (cm):  4   Depth (mm):  2 Exploration:    Limited defect created (wound extended): no     Imaging outcome: foreign body not noted     Wound exploration: wound explored through full range of motion     Contaminated: no   Treatment:    Area cleansed with:  Povidone-iodine and saline   Amount of cleaning:  Standard   Irrigation solution:  Sterile saline   Irrigation method:  Pressure wash Skin repair:    Repair method:  Sutures and tissue adhesive   Suture size:  2-0   Suture material:  Fast-absorbing gut   Suture technique:  Subcuticular   Number of sutures:  2 Approximation:    Approximation:  Close Repair type:    Repair type:   Simple Post-procedure details:    Dressing:  Bulky dressing Comments:     2 and half centimeter laceration repaired with two 2-0 Vicryl inverted sutures.  The remainder of the wound covered with Dermabond along the skin tear.  Essentially 2-1/2 cm laceration running into a superficial skin tear measuring an additional 1.5 cm.     Medications Ordered in ED Medications  lidocaine (PF) (XYLOCAINE) 1 % injection 5 mL (5 mLs Infiltration Given by Other 07/02/23 1945)  Tdap (BOOSTRIX) injection 0.5 mL (0.5 mLs Intramuscular Given 07/02/23 1958)    ED Course/ Medical Decision Making/ A&P                                 Medical Decision Making Risk Prescription drug management.  78 year old female with fall earlier today.  She bumped her head, had a slight headache.  CT of the head and neck was negative.  She mainly was here today for a laceration to the left elbow, no evidence of fracture on physical exam or history to the elbow.  She had a laceration that was thoroughly cleansed and repaired with dissolvable sutures as well as Dermabond and a bulky dressing.  Tetanus was updated.  She also suffered a slight injury to the left ankle where x-rays were ordered and reviewed by me today is negative but her history and exam consistent with ankle sprain.  She was placed into ASO brace.  She will rest ice and elevate the ankle and take occasional ibuprofen at home as needed for any soreness.  She understands signs symptoms return to the ER for.  Final Clinical Impression(s) / ED Diagnoses Final diagnoses:  Fall, initial encounter  High ankle sprain, left, initial encounter  Injury of head, initial encounter  Laceration of left elbow, initial encounter    Rx / DC Orders ED Discharge Orders     None         Ronnette Juniper 07/02/23 2008    Jene Every, MD 07/05/23 [id number]

## 2023-07-02 NOTE — Discharge Instructions (Signed)
Please keep laceration site clean and dry for 2 days then you may shower and get wet.  Allow Dermabond to come off on its own.  Return for any warmth redness drainage or swelling along the laceration site.  Please take Tylenol and/or ibuprofen over the next few days as needed for any soreness/pain.  Return to the ER for any worsening symptoms or any urgent changes in your health.  Wear ASO brace for left ankle pain/swelling over the next 4 to 6 weeks especially if you are outside walking or walking on uneven surfaces where you are at higher risk of rolling her ankle.

## 2023-07-02 NOTE — ED Triage Notes (Signed)
Patient states she tripped and fell off porch onto ground; denies LOC, reports laceration to left elbow and left ankle swelling; Patient is on Eliquis.

## 2023-07-14 ENCOUNTER — Telehealth: Payer: Self-pay | Admitting: *Deleted

## 2023-07-14 NOTE — Telephone Encounter (Signed)
Transition Care Management Unsuccessful Follow-up Telephone Call  Date of discharge and from where:  [facility]  07/02/2023  Attempts:  1st Attempt  Reason for unsuccessful TCM follow-up call:  Left voice message

## 2023-07-17 ENCOUNTER — Telehealth: Payer: Self-pay | Admitting: *Deleted

## 2023-07-17 NOTE — Telephone Encounter (Signed)
Transition Care Management Unsuccessful Follow-up Telephone Call  Date of discharge and from where:  [facility]  07/02/2023  Attempts:  2nd Attempt  Reason for unsuccessful TCM follow-up call:  Left voice message

## 2023-07-18 ENCOUNTER — Other Ambulatory Visit: Payer: Self-pay | Admitting: Cardiology

## 2023-08-12 ENCOUNTER — Emergency Department (HOSPITAL_COMMUNITY): Payer: PPO

## 2023-08-12 ENCOUNTER — Emergency Department (HOSPITAL_COMMUNITY)
Admission: EM | Admit: 2023-08-12 | Discharge: 2023-08-12 | Disposition: A | Discharge status: Home / Self Care | Payer: PPO | Attending: Emergency Medicine | Admitting: Emergency Medicine

## 2023-08-12 ENCOUNTER — Encounter (HOSPITAL_COMMUNITY): Payer: Self-pay

## 2023-08-12 ENCOUNTER — Other Ambulatory Visit: Payer: Self-pay

## 2023-08-12 DIAGNOSIS — Z7901 Long term (current) use of anticoagulants: Secondary | ICD-10-CM | POA: Insufficient documentation

## 2023-08-12 DIAGNOSIS — R0781 Pleurodynia: Secondary | ICD-10-CM | POA: Diagnosis not present

## 2023-08-12 DIAGNOSIS — Z043 Encounter for examination and observation following other accident: Secondary | ICD-10-CM | POA: Diagnosis not present

## 2023-08-12 DIAGNOSIS — I10 Essential (primary) hypertension: Secondary | ICD-10-CM | POA: Insufficient documentation

## 2023-08-12 DIAGNOSIS — S299XXA Unspecified injury of thorax, initial encounter: Secondary | ICD-10-CM | POA: Diagnosis not present

## 2023-08-12 DIAGNOSIS — M546 Pain in thoracic spine: Secondary | ICD-10-CM | POA: Diagnosis not present

## 2023-08-12 DIAGNOSIS — M8580 Other specified disorders of bone density and structure, unspecified site: Secondary | ICD-10-CM | POA: Diagnosis not present

## 2023-08-12 DIAGNOSIS — M8448XA Pathological fracture, other site, initial encounter for fracture: Secondary | ICD-10-CM | POA: Diagnosis not present

## 2023-08-12 DIAGNOSIS — R519 Headache, unspecified: Secondary | ICD-10-CM | POA: Diagnosis not present

## 2023-08-12 DIAGNOSIS — W06XXXA Fall from bed, initial encounter: Secondary | ICD-10-CM | POA: Diagnosis not present

## 2023-08-12 DIAGNOSIS — S92522A Displaced fracture of medial phalanx of left lesser toe(s), initial encounter for closed fracture: Secondary | ICD-10-CM | POA: Diagnosis not present

## 2023-08-12 DIAGNOSIS — M542 Cervicalgia: Secondary | ICD-10-CM | POA: Diagnosis not present

## 2023-08-12 DIAGNOSIS — R0789 Other chest pain: Secondary | ICD-10-CM | POA: Insufficient documentation

## 2023-08-12 DIAGNOSIS — W19XXXA Unspecified fall, initial encounter: Secondary | ICD-10-CM

## 2023-08-12 DIAGNOSIS — M79672 Pain in left foot: Secondary | ICD-10-CM | POA: Diagnosis not present

## 2023-08-12 DIAGNOSIS — M47814 Spondylosis without myelopathy or radiculopathy, thoracic region: Secondary | ICD-10-CM | POA: Diagnosis not present

## 2023-08-12 MED ORDER — CYCLOBENZAPRINE HCL 5 MG PO TABS: 5.0000 mg | ORAL_TABLET | Freq: Two times a day (BID) | ORAL | 0 refills | Status: DC | PRN

## 2023-08-12 MED ORDER — LIDOCAINE 5 % EX PTCH: 1.0000 | MEDICATED_PATCH | CUTANEOUS | 0 refills | Status: DC

## 2023-08-12 MED ORDER — CYCLOBENZAPRINE HCL 10 MG PO TABS
5.0000 mg | ORAL_TABLET | Freq: Once | ORAL | Status: AC
  Administered 2023-08-12: 5 mg via ORAL
  Filled 2023-08-12: qty 1

## 2023-08-12 MED ORDER — ACETAMINOPHEN 325 MG PO TABS
650.0000 mg | ORAL_TABLET | Freq: Once | ORAL | Status: AC
  Administered 2023-08-12: 650 mg via ORAL
  Filled 2023-08-12: qty 2

## 2023-08-12 NOTE — ED Provider Notes (Signed)
[place] EMERGENCY DEPARTMENT AT [facility] Provider Note   CSN: [id number] Arrival date & time: 08/12/23  7829     History {Add pertinent medical, surgical, social history, OB history to HPI:1} Chief Complaint  Patient presents with   [name] is a 77 y.o. female.  HPI     Larey Seat out of bed early this AM, thinks about 3AM, heard a noise and tried to get up but was cross-ways in the bed and fell out head first hitting head. No LOC.  Having some headache 5/10, neck pain 5/10. Also having pain right side of chest which is better now, was worse with movements.  No shortness of breath, nausea, vomiting, numbness,weakness, change in vision, trouble talking or walking (no new problems, has baseline difficulty)  On eliquis, took it this morning  Home Medications Prior to Admission medications   Medication Sig Start Date End Date Taking? Authorizing Provider  amLODipine (NORVASC) 2.5 MG tablet TAKE 1 TABLET BY MOUTH EVERY DAY 02/16/23   Lewayne Bunting, MD  ELIQUIS 5 MG TABS tablet TAKE 1 TABLET BY MOUTH TWICE A DAY 12/15/22   [place], Peter M, MD  losartan (COZAAR) 100 MG tablet TAKE 1 TABLET BY MOUTH EVERY DAY 06/15/23   [place], Peter M, MD  metoprolol succinate (TOPROL-XL) 50 MG 24 hr tablet TAKE 1 TABLET BY MOUTH EVERY DAY 02/16/23   Lewayne Bunting, MD  propafenone (RYTHMOL) 300 MG tablet TAKE 1 TABLET BY MOUTH TWICE A DAY 07/19/23   Ronney Asters, NP  zolpidem (AMBIEN) 5 MG tablet Take 0.5 tablets (2.5 mg total) by mouth at bedtime. 02/21/23   Lomax, Amy, NP      Allergies    Codeine, Other, and Oxycodone    Review of Systems   Review of Systems  Physical Exam Updated Vital Signs BP (!) 178/74 (BP Location: Left Arm)   Pulse (!) 54   Temp 97.6 F (36.4 C) (Oral)   Resp 17   Ht 5\' 2"  (1.575 m)   Wt 83.9 kg   SpO2 97%   BMI 33.84 kg/m  Physical Exam  ED Results / Procedures / Treatments   Labs (all labs ordered are listed, but only  abnormal results are displayed) Labs Reviewed - No data to display  EKG None  Radiology DG Ribs Unilateral W/Chest Right  Result Date: 08/12/2023 CLINICAL DATA:  Fall, rib pain EXAM: RIGHT RIBS AND CHEST - 3+ VIEW COMPARISON:  02/25/2009 FINDINGS: No displaced fracture or other bone lesions are seen involving the ribs. There is no evidence of pneumothorax or pleural effusion. Both lungs are clear. Heart size and mediastinal contours are within normal limits. IMPRESSION: No displaced fracture or other radiographic abnormality of the right ribs to explain pain. Electronically Signed   By: Jearld Lesch M.D.   On: 08/12/2023 18:59   CT Head Wo Contrast  Result Date: 08/12/2023 CLINICAL DATA:  Fall, head and neck pain EXAM: CT HEAD WITHOUT CONTRAST CT CERVICAL SPINE WITHOUT CONTRAST TECHNIQUE: Multidetector CT imaging of the head and cervical spine was performed following the standard protocol without intravenous contrast. Multiplanar CT image reconstructions of the cervical spine were also generated. RADIATION DOSE REDUCTION: This exam was performed according to the departmental dose-optimization program which includes automated exposure control, adjustment of the mA and/or kV according to patient size and/or use of iterative reconstruction technique. COMPARISON:  07/02/2023 FINDINGS: CT HEAD FINDINGS Brain: No evidence of acute infarction, hemorrhage, hydrocephalus,  extra-axial collection or mass lesion/mass effect. Vascular: No hyperdense vessel or unexpected calcification. Skull: Normal. Negative for fracture or focal lesion. Sinuses/Orbits: No acute finding. Other: None. CT CERVICAL SPINE FINDINGS Alignment: Degenerative straightening of the normal cervical lordosis. Skull base and vertebrae: No acute fracture. No primary bone lesion or focal pathologic process. Soft tissues and spinal canal: No prevertebral fluid or swelling. No visible canal hematoma. Disc levels: Severe disc space height loss and  osteophytosis from C4-C6 with otherwise relatively preserved disc spaces. Upper chest: Negative. Other: None. IMPRESSION: 1. No acute intracranial pathology. 2. No fracture or static subluxation of the cervical spine. 3. Severe disc space height loss and osteophytosis from C4-C6 with otherwise relatively preserved disc spaces. Electronically Signed   By: Jearld Lesch M.D.   On: 08/12/2023 18:46   CT Cervical Spine Wo Contrast  Result Date: 08/12/2023 CLINICAL DATA:  Fall, head and neck pain EXAM: CT HEAD WITHOUT CONTRAST CT CERVICAL SPINE WITHOUT CONTRAST TECHNIQUE: Multidetector CT imaging of the head and cervical spine was performed following the standard protocol without intravenous contrast. Multiplanar CT image reconstructions of the cervical spine were also generated. RADIATION DOSE REDUCTION: This exam was performed according to the departmental dose-optimization program which includes automated exposure control, adjustment of the mA and/or kV according to patient size and/or use of iterative reconstruction technique. COMPARISON:  07/02/2023 FINDINGS: CT HEAD FINDINGS Brain: No evidence of acute infarction, hemorrhage, hydrocephalus, extra-axial collection or mass lesion/mass effect. Vascular: No hyperdense vessel or unexpected calcification. Skull: Normal. Negative for fracture or focal lesion. Sinuses/Orbits: No acute finding. Other: None. CT CERVICAL SPINE FINDINGS Alignment: Degenerative straightening of the normal cervical lordosis. Skull base and vertebrae: No acute fracture. No primary bone lesion or focal pathologic process. Soft tissues and spinal canal: No prevertebral fluid or swelling. No visible canal hematoma. Disc levels: Severe disc space height loss and osteophytosis from C4-C6 with otherwise relatively preserved disc spaces. Upper chest: Negative. Other: None. IMPRESSION: 1. No acute intracranial pathology. 2. No fracture or static subluxation of the cervical spine. 3. Severe disc space  height loss and osteophytosis from C4-C6 with otherwise relatively preserved disc spaces. Electronically Signed   By: Jearld Lesch M.D.   On: 08/12/2023 18:46    Procedures Procedures  {Document cardiac monitor, telemetry assessment procedure when appropriate:1}  Medications Ordered in ED Medications  acetaminophen (TYLENOL) tablet 650 mg (650 mg Oral Given 08/12/23 1945)    ED Course/ Medical Decision Making/ A&P   {   Click here for ABCD2, HEART and other calculatorsREFRESH Note before signing :1}                              Medical Decision Making  ***  {Document critical care time when appropriate:1} {Document review of labs and clinical decision tools ie heart score, Chads2Vasc2 etc:1}  {Document your independent review of radiology images, and any outside records:1} {Document your discussion with family members, caretakers, and with consultants:1} {Document social determinants of health affecting pt's care:1} {Document your decision making why or why not admission, treatments were needed:1} Final Clinical Impression(s) / ED Diagnoses Final diagnoses:  None    Rx / DC Orders ED Discharge Orders     None

## 2023-08-12 NOTE — ED Triage Notes (Signed)
Pt was rolling over in the bed during the night and fell out of the bed. Pt does endorse pain in the neck and ribs. Pt did hit her head, but denies LOC. Pt does take eliquis.

## 2023-08-12 NOTE — ED Provider Triage Note (Signed)
Emergency Medicine Provider Triage Evaluation Note  [name] , a 77 y.o. female  was evaluated in triage.  Pt complains of head and neck pain after hitting her head throughout the night.  Patient is rolled over and misjudged where she was on the bed and hit the crown of her head.  Patient does take Eliquis and takes this daily.  Patient that she does have neck pain that is exacerbated with movement denies paresthesias, weakness, vision changes, chest pain, shortness of breath..  Review of Systems  Positive: See HPI Negative: See HPI  Physical Exam  BP (!) 193/80 (BP Location: Left Arm)   Pulse (!) 58   Temp 97.9 F (36.6 C) (Oral)   Resp 16   Ht 5\' 2"  (1.575 m)   Wt 83.9 kg   SpO2 98%   BMI 33.84 kg/m  Gen:   Awake, no distress   Resp:  Normal effort  MSK:   Moves extremities without difficulty  Other:  Cervical midline tenderness without abnormalities, equal smile, 5 bilateral grip strength  Medical Decision Making  Medically screening exam initiated at 5:31 PM.  Appropriate orders placed.  KIERAN NACHTIGAL was informed that the remainder of the evaluation will be completed by another provider, this initial triage assessment does not replace that evaluation, and the importance of remaining in the ED until their evaluation is complete.  Workup initiated, imaging obtained and patient placed in c-collar, patient given Tylenol for pain, patient stable at this time.   Netta Corrigan, PA-C 08/12/23 1737

## 2023-08-12 NOTE — ED Notes (Addendum)
7:40 PM  Patient reports rolling out OOB while attempting to get OOB and landed on carpeted flooring. Patient endorses positive head-strike. Denies LOC. Takes eloquis for a. Fib. Reports right sided rib pain that worsens with deep inhalation and movement and neck pain. Currently rates pain 5/10 on pain scale. Denies dizziness, lightheadedness, nausea, vomiting, fevers, chills, head pain, visual changes, weakness, CP, SOB. She is alert and oriented x 4. She has equal rise and fall of the chest wall with clear lung sounds. She is medicated per order. Pending CT scan results. Family present at bedside. Vital signs updated. Call light in reach. Bed in lowest position.   8:30 PM  Patient transported to xray.   11:19 PM  Discharge instructions discussed with patient and family. Patient and family voice understanding of discharge instructions and declines any needs at this time. Patient is stable at discharge. She has steady and equal gait, but wheelchair provided. Prescription information discussed with patient and family using teach-back method.

## 2023-08-14 ENCOUNTER — Other Ambulatory Visit: Payer: Self-pay | Admitting: *Deleted

## 2023-08-14 DIAGNOSIS — F5104 Psychophysiologic insomnia: Secondary | ICD-10-CM

## 2023-08-14 MED ORDER — ZOLPIDEM TARTRATE 5 MG PO TABS
2.5000 mg | ORAL_TABLET | Freq: Every day | ORAL | 0 refills | Status: DC
Start: 2023-08-14 — End: 2023-11-16

## 2023-08-14 NOTE — Telephone Encounter (Signed)
Last seen on 02/21/23 per note " We will continue Ambien 2.5mg  every night. " Follow up scheduled on 09/06/23 Last filled 02/21/23 Rx pending to be signed

## 2023-08-18 DIAGNOSIS — L820 Inflamed seborrheic keratosis: Secondary | ICD-10-CM | POA: Diagnosis not present

## 2023-08-18 DIAGNOSIS — L570 Actinic keratosis: Secondary | ICD-10-CM | POA: Diagnosis not present

## 2023-08-18 DIAGNOSIS — X32XXXD Exposure to sunlight, subsequent encounter: Secondary | ICD-10-CM | POA: Diagnosis not present

## 2023-08-18 DIAGNOSIS — L308 Other specified dermatitis: Secondary | ICD-10-CM | POA: Diagnosis not present

## 2023-08-28 DIAGNOSIS — S92912A Unspecified fracture of left toe(s), initial encounter for closed fracture: Secondary | ICD-10-CM | POA: Diagnosis not present

## 2023-08-28 DIAGNOSIS — M19072 Primary osteoarthritis, left ankle and foot: Secondary | ICD-10-CM | POA: Diagnosis not present

## 2023-09-05 NOTE — Patient Instructions (Incomplete)
Below is our plan:  We will continue Ambien 2.5 (1/2 tablet) every night. Let PCP know about back ad neck pain. Follow up with Dr Danielle Dess as directed. Consider using a cane or walking stick for stability. Consider PT.   Please make sure you are staying well hydrated. I recommend 50-60 ounces daily. Well balanced diet and regular exercise encouraged. Consistent sleep schedule with 6-8 hours recommended.   Please continue follow up with care team as directed.   Follow up with Dr Vickey Huger  in 6 months   You may receive a survey regarding today's visit. I encourage you to leave honest feed back as I do use this information to improve patient care. Thank you for seeing me today!

## 2023-09-05 NOTE — Progress Notes (Unsigned)
PATIENT: [name] DOB: [birth year]  REASON FOR VISIT: follow up HISTORY FROM: patient  No chief complaint on file.    HISTORY OF PRESENT ILLNESS:  09/05/23 ALL:  [name] returns for follow up for insomnia. She continues Ambien 2.5mg  at bedtime.   02/21/2023 ALL:  [name] returns for follow up for insomnia. She was last seen by Dr [name] 02/2022. Ambien 5mg  QHS continued. Belsomra given to help wean Ambien. Referral placed to [name], psychology, for CBT. Since, she reports doing well. She did not feel Belsomra was effective. She took med for about a week then returned to Ambien 2.5mg . She denies adverse effects with Ambien. She is aware of risks. She has failed multiple sleep aids an nothing works for her. She did not seek CBT. She will consider in the future.   02/16/2022 CD: [name] is a meanwhile 77 year old Caucasian female patient of our practice who has a history of obstructive sleep apnea with hypoxia her last sleep study was in March 2017, since then she has been followed for chronic insomnia.  She is able to fall asleep with a low-dose of Ambien 2.5 mg, and she has to get up 2-3 times each night to go to the bathroom but is able to go back to sleep.  She lives alone.  Her husband died in October 24, 2018.  Current medication list was reviewed evaluated she is on Eliquis Cozaar, Toprol, Rythmol for atrial fibrillation, again Ambien she takes at a very low-dose and I am glad she does that.  She had 2 knee replacements and recently had an ankle surgery on the right.  So temporarily she was on pain medication and opiates worked to put he to sleep, but she only took it for 7 days post surgery. She fought nausea as well on opiates. I had in 10-24-2018 suggested switching to Trazodone, but that failed.  She would benefit from cognitive behavior therapy- mind is racing.  01/20/2021 ALL:  [name] returns today for follow up on chronic insomnia. She has not been able to wean Ambien. She  continues 2.5mg  night. Occasionally she has to take another 2.5mg  to help get a few more hours of sleep. She has tried melatonin and valerian  But both were ineffective. She is getting about 6-7 hours of sleep every night. She lives alone. She does have a dog. She has neighbors that check in on her. She denies adverse effects.   Blood pressure is elevated, today. She continues metoprolol but no longer taking amlodipine. She has atrial fib. She had an event last week where she was in atrial fib and was advised to take an extra dose of metoprolol which seemed to help. She has follow up with cardiology today. She has noted elevated BP readings. She has also gained some weight following her last knee surgery. She is having a hard time getting the weight back off.   07/23/2020 ALL:  [name] is a 77 y.o. female here today for follow up for insomnia.  She has continued to wean Ambien.  She is now taking 2.5 mg every night at bedtime.  She has had more difficulty with insomnia since decreasing Ambien dose.  She is motivated to discontinue Ambien as this is no longer covered by her insurance.  She has tried melatonin in the past but is willing to try this medication again.  No obvious adverse effects.  She is feeling well otherwise.  She remains very active at home.  She  denies any significant concerns of anxiety or depression. She  01/20/2020 ALL: [name] is a 77 y.o. female here today for follow up for insomnia. She continues Ambien 5mg  at bedtime. She feels that Ambien is the only medication that seems to help. She does try to take less when she can. She is recovering from right knee replacement. She had a left knee replacement in July. She is recovering well. She does admit to forgetting her BP meds this morning. BP is usually 140's/80's.    HISTORY: (copied from Dr Dohmeier's note on 07/22/2019) 9-14- 2020, RV with [name], a 77 year old hearing impaired Caucasian female patient.   [name] lost her husband and is now living by herself, she has not to her knowledge contracted any COVID or was exposed to.  She has been followed here for years for obstructive sleep apnea with sleep hypoxemia and has also chronic insomnia.  She is not daytime excessively sleepy endorsing the Epworth score at 1 point today the fatigue severity at 18 points and the geriatric depression score at 4 out of 15 points.  Her hearing aids no longer serve her well and therefore we have switched to a face shield on my part to that she can read my lips.medicare no longer paid for her machine. No longer on CPAP. Pharnacy is now [facility]- she wants Ambien refilled, but understands 5 mg is her age limit. She underwent knee replacement. Post surgery, left knee- &-24-2020.,she used oxycodone and tramadol, but couldn't  tolerate these, she lost appetit , was latently nauseated.  She wants to use Ambien instead.    07-18-2018, Rv with [name] -a 77 year old female patient who has become her husbands caretaker.   She endorses today the fatigue severity scale at 29 points, and her Epworth sleepiness score at 2 points.  The patient has suffered for a long time but is chronic insomnia, and underlying condition that has been treated with Ambien at 10 mg.  And she continues to have a.  Of wakefulness somewhere between 2 and 3 AM, this is also correlated to her Fitbit recording.  Her husband continues to receive chemotherapy, she had been successful in losing some weight but has been stable, certainly stress related.    01-15-2018, RV for this 77 year old caucasian married female with chronic insomnia- and who has learnt to live with it. She reports bedtime is around 11.30 pm, sleeping for about 7 hours total.  Her husband is on chemotherapy and tolerating it well, he takes naps in daytime, she doesn't.  She has lost weight again, and has not gained back over the last 12 month. She reports her cardiologist is happy with her,  too. Her endorsing the Epworth score at only 2 points, FSS 23,    Today 12/27/16: [name] is a 77 year old female with a history of chronic insomnia. She returns today for follow-up. At her last visit she was advised that she did not have to use the CPAP as it was not offering her much benefit. She states that she's been using Ambien 10 mg at bedtime. She states that this is the only medication that has offered her some benefit. She states that she still wakes up numerous times a night but she is able to go back to sleep. She reports on the rare occasion she will have a night that she cannot sleep at all. She was referred to Dr. Donell Beers but states that she never made  an appointment. She states that her husband has had a lot of health issues and may being consumed with that for the last several months. She states that she understands that she has a lot of anxiety but she does not feel depressed. She returns today for an evaluation. And feel refreshed  We will follow HISTORY  01-20-2012 per Dr. Vickey Choi' notes: DAYLANI LOUDENSLAGER is a 39 female and seen here in referral  from Dr. Dayton Martes for chronic insomnia. The patient will be evaluated for organic reasons of insomnia.    The patient was last seen on  01-20-12  and has lost a lot of weight in the meantime. After trazodone and Remeron did fail to produce satisfying results for now she will sometimes take a Benadryl or Tylenol PM to assist her with her sleep initiation. She thinks nocturia  was never the main problem. The patient states that she has lost a lot of weight intentionally and that this has helped certainly some of her other health issues including hypertension. She was diagnosed with atrial fibrillation in the past has been on chronic anticoagulation. She reported that topiramate caused her to be drowsy and daytime when taken in the morning for that reason we will change the intake time 2 PM this may assist with her sleepiness as well as was her  headaches. It has also helped her to lose weight.  At bedtime routines were reviewed : the patient was to bed around 11 PM rises at 6:30 AM depending on how much sleep she got.  She did have amnestic spells from after some nights when using Ambien- but she sleeps all much better on Ambien but on the other medications.  The onset of her insomnia was related to the development of menopause but she only has really hot flashes now- she uses Ambien chronically for 2 decades.   Her bedroom is described as quiet, cool and dark she shares a bedroom with her husband, who is very sick.    Interval history , 01-13-15 CD, Mrs. Stables is seen here following up on her sleep study from 01-29-14 the study showed a patient with normal heart rate some prolonged oxygen desaturation and very mild apnea. A problem for the patient was that she couldn't really sleep through the night she was 53 minutes awake during her sleep study. 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 Dr. Freida Busman had originally been referred to the patient for insomnia evaluation. Based on the hypoxemia and the mix of a mild sleep apnea , she could be invited the patient to come in for a CPAP titration split-night study. The patient is beginning cataracts and she has just been seen by optometry, she has been insomnic again. Testing was suspected to reveal also retinal disease and she was referred to Dr. Luciana Axe a retinal specialist. She has a macular pucker. Surgery is scheduled for tomorrow morning 01-14-15. She is in need of a refill for Ambien which he uses to treat her chronic insomnia. In spite of this the last week had been difficult for her I think because of anxiety in anticipation of the surgery. Her fatigue severity score is up to 40 points and her fatigue her Epworth sleepiness score is normal at 2 points.   Her sleep is not restorative, still. She will have an overnight pulse-oximetry after her surgery is done. I will schedule this though  [facility].  Rv with NP or me in May-June 2016.   06-04-15, Mrs. Garver is  here status post cataract ectomy bilaterally and surgery to the retina. As I had quoted in my last visit she underwent a sleep study on 3-20 5-15. She continued to complain of nonrestorative not refreshing sleep. She feels however that she has started to manage better with that and her fatigue is no longer as excessive. She endorsed the fatigue severity score of 31 points and the Epworth sleepiness score at 3 points. She's not likely to fall asleep unintended. She doesn't struggle with sleep attacks. In her geriatric depression score she endorsed only 1 point which is not indicative of suffering from clinical depression. She wears a fit bit, which records her pulse, she wore a ONO, and had 2 hours of low oxygen levels, she will need a referral to pulmonology for further evaluation or will proceed with a CPAP auto-titration, to see if correcting the mild apnea is an option. She needs to lose weight.    04-25-2016 Mrs. Nara was last seen by mypractitioner Darrol Angel, and referred for a new CPAP titration on 01/27/2016. She was again diagnosed with insomnia and a low sleep efficiency of only 72.5% CPAP was explored from 5 through 8 cm water. And an air-fit P 10 nasal pillow in extra small size was used. She still reports that she feels restless and uncomfortable just was having anything in her face or attached to her body. And of course the PLM arousals were not addressed by CPAP. She would like to sleep on her side but feels compelled to sleep in supine position so that the mass does not get as long.   The compliance data since May 30 show 14 out of 15 days of use she used to machine over 4 hours for only 11 out of 15 days. A 74% compliance. The average user time is 4 hours and 56 minutes. The set pressure was 8 cm water with 3 cm EPR and her residual AHI is 2.9 there is a significant reduction in her apnea index. However her  baseline AHI was not that high in 2015 with an AHI of 9. I think given that the patient feels no improvement in her daytime alertness and actually more bothered by the machine and given that she has a mild apnea to begin with I would allow her to discontinue the use of CPAP she could change to a dental device is snoring bothers her, but she denies. She will concentrate on weight loss.   REVIEW OF SYSTEMS: Out of a complete 14 system review of symptoms, the patient complains only of the following symptoms, insomnia, right knee pain, weight gain and all other reviewed systems are negative.  ESS:4 FSS:43   ALLERGIES: Allergies  Allergen Reactions   Codeine Nausea And Vomiting   Other Nausea And Vomiting    general anesthesia   Oxycodone Nausea And Vomiting    HOME MEDICATIONS: Outpatient Medications Prior to Visit  Medication Sig Dispense Refill   amLODipine (NORVASC) 2.5 MG tablet TAKE 1 TABLET BY MOUTH EVERY DAY 90 tablet 3   cyclobenzaprine (FLEXERIL) 5 MG tablet Take 1-2 tablets (5-10 mg total) by mouth 2 (two) times daily as needed for muscle spasms. 30 tablet 0   ELIQUIS 5 MG TABS tablet TAKE 1 TABLET BY MOUTH TWICE A DAY 180 tablet 1   lidocaine (LIDODERM) 5 % Place 1 patch onto the skin daily. Remove & Discard patch within 12 hours or as directed by MD 30 patch 0   losartan (COZAAR) 100 MG tablet TAKE  1 TABLET BY MOUTH EVERY DAY 90 tablet 3   metoprolol succinate (TOPROL-XL) 50 MG 24 hr tablet TAKE 1 TABLET BY MOUTH EVERY DAY 90 tablet 3   propafenone (RYTHMOL) 300 MG tablet TAKE 1 TABLET BY MOUTH TWICE A DAY 180 tablet 3   zolpidem (AMBIEN) 5 MG tablet Take 0.5 tablets (2.5 mg total) by mouth at bedtime. 45 tablet 0   No facility-administered medications prior to visit.    PAST MEDICAL HISTORY: Past Medical History:  Diagnosis Date   Acute upper respiratory infections of unspecified site    Arthritis    Carotid bruit    Carotid disease, bilateral (HCC) 01/31/2012   Mild  noted on US carotid   Cataracts, both eyes    Chronic insomnia    Diverticulosis    pt denies   Dysrhythmia    a fib   Fatty liver    Heart murmur    Has been told by an MD.   Hiatal hernia    Denies   History of colon polyps    Noted on Colonscopy   History of gallstones    Hyperlipidemia    Hypertension    Insomnia, controlled 01/01/2014   Internal hemorrhoids    Macular pucker, left eye    surgery planned 01-14-15   Menopause    noticed in early 50's   MR (mitral regurgitation) 09/10/2014   Mild, Noted on ECHO    Other acute sinusitis    Other malaise and fatigue    Paroxysmal atrial fibrillation (HCC)    on meds   Pneumonia    as a small child   PONV (postoperative nausea and vomiting)    Pulmonary hypertension (HCC) 09/10/2014   Mild, Noted on ECHO   Pure hypercholesterolemia    Restless leg syndrome    Routine general medical examination at a health care facility    Screening for lipoid disorders    Sleep apnea    no c-pap   Special screening for malignant neoplasms, colon    Uterine fibroid 09/19/1999   Noted on pelvis ultrasound    PAST SURGICAL HISTORY: Past Surgical History:  Procedure Laterality Date   cardiolyte neg  6/06   cardiovascular stress MRI  12/20/06   afib   CHOLECYSTECTOMY     COLONOSCOPY     EYE SURGERY     cataract   Toric implant bil eyes   EYE SURGERY Right 07/29/2020   Mem Peel and Vit - Dr. Luciana Axe   KNEE ARTHROSCOPY     left   ORIF- L distal radius  9/07   left wrist   PARS PLANA VITRECTOMY W/ REPAIR OF MACULAR HOLE     left eye   torn meniscus     bil knees     TOTAL ANKLE ARTHROPLASTY Right 01/06/2022   Procedure: TOTAL ANKLE ARTHROPLASTY;  Surgeon: Toni Arthurs, MD;  Location: [facility];  Service: Orthopedics;  Laterality: Right;   TOTAL KNEE ARTHROPLASTY Left 05/31/2019   Procedure: LEFT TOTAL KNEE ARTHROPLASTY;  Surgeon: Kathryne Hitch, MD;  Location: [facility];  Service: Orthopedics;  Laterality:  Left;   TOTAL KNEE ARTHROPLASTY Right 09/20/2019   Procedure: RIGHT TOTAL KNEE ARTHROPLASTY;  Surgeon: Kathryne Hitch, MD;  Location: [facility];  Service: Orthopedics;  Laterality: Right;   TUBAL LIGATION     UPPER GI ENDOSCOPY      FAMILY HISTORY: Family History  Problem Relation Age of Onset   Breast cancer Mother  Heart disease Father    Colon cancer Cousin 97   Diabetes Other        cousins    SOCIAL HISTORY: Social History   Socioeconomic History   Marital status: Widowed    Spouse name: Velda Shell   Number of children: 3   Years of education: College   Highest education level: Not on file  Occupational History   Occupation: Conservator, museum/gallery    Employer: [employer]  Tobacco Use   Smoking status: Never   Smokeless tobacco: Never  Vaping Use   Vaping status: Never Used  Substance and Sexual Activity   Alcohol use: Yes    Comment: rarely   Drug use: No   Sexual activity: Not Currently    Birth control/protection: Post-menopausal  Other Topics Concern   Not on file  Social History Narrative   Patient is widowed. Patient has three adult children.   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    Patient is right-handed.   Patient drinks two cups of caffeine daily.   Patient has a college education.   Social Determinants of Health   Financial Resource Strain: Low Risk  (06/21/2022)   Overall Financial Resource Strain (CARDIA)    Difficulty of Paying Living Expenses: Not hard at all  Food Insecurity: No Food Insecurity (06/21/2022)   Hunger Vital Sign    Worried About Running Out of Food in the Last Year: Never true    Ran Out of Food in the Last Year: Never true  Transportation Needs: No Transportation Needs (06/21/2022)   PRAPARE - Administrator, Civil Service (Medical): No    Lack of Transportation (Non-Medical): No  Physical Activity: Inactive (06/21/2022)   Exercise Vital Sign    Days of Exercise per  Week: 0 days    Minutes of Exercise per Session: 0 min  Stress: No Stress Concern Present (06/21/2022)   [employer] of Occupational Health - Occupational Stress Questionnaire    Feeling of Stress : Not at all  Social Connections: Moderately Integrated (06/21/2022)   Social Connection and Isolation Panel [NHANES]    Frequency of Communication with Friends and Family: Three times a week    Frequency of Social Gatherings with Friends and Family: Once a week    Attends Religious Services: More than 4 times per year    Active Member of [employer] or Organizations: Yes    Attends Banker Meetings: More than 4 times per year    Marital Status: Widowed  Intimate Partner Violence: Not At Risk (06/21/2022)   Humiliation, Afraid, Rape, and Kick questionnaire    Fear of Current or Ex-Partner: No    Emotionally Abused: No    Physically Abused: No    Sexually Abused: No      PHYSICAL EXAM  There were no vitals filed for this visit.   There is no height or weight on file to calculate BMI.  Generalized: Well developed, in no acute distress  Cardiology: normal rate and rhythm, no murmur noted Respiratory: clear to auscultation bilaterally  Neurological examination  Mentation: Alert oriented to time, place, history taking. Follows all commands speech and language fluent Cranial nerve II-XII: Pupils were equal round reactive to light. Extraocular movements were full, visual field were full  Motor: The motor testing reveals 5 over 5 strength of all 4 extremities. Good symmetric motor tone is noted throughout.  Gait and station: Gait is normal.  DIAGNOSTIC DATA (LABS, IMAGING, TESTING) - I reviewed patient records, labs, notes, testing and imaging myself where available.     01/20/2016    1:56 PM  MMSE - Mini Mental State Exam  Orientation to time 5  Orientation to Place 5  Registration 3  Attention/ Calculation 5  Recall 3  Language- name 2 objects 0  Language- repeat 1   Language- follow 3 step command 3  Language- read & follow direction 1  Write a sentence 0  Copy design 0  Total score 26     Lab Results  Component Value Date   WBC 6.1 05/26/2023   HGB 13.6 05/26/2023   HCT 40.3 05/26/2023   MCV 90.4 05/26/2023   PLT 202 05/26/2023      Component Value Date/Time   NA 138 05/26/2023 0953   NA 141 03/02/2023 1059   K 4.8 05/26/2023 0953   CL 107 05/26/2023 0953   CO2 24 05/26/2023 0953   GLUCOSE 111 (H) 05/26/2023 0953   BUN 29 (H) 05/26/2023 0953   BUN 20 03/02/2023 1059   CREATININE 1.07 (H) 05/26/2023 0953   CALCIUM 8.8 05/26/2023 0953   PROT 6.7 07/22/2020 1028   ALBUMIN 3.9 07/22/2020 1028   AST 16 07/22/2020 1028   ALT 13 07/22/2020 1028   ALKPHOS 101 07/22/2020 1028   BILITOT 0.5 07/22/2020 1028   GFRNONAA 46 (L) 07/22/2020 1028   GFRAA 53 (L) 07/22/2020 1028   Lab Results  Component Value Date   CHOL 187 12/25/2018   HDL 61 12/25/2018   LDLCALC 105 (H) 12/25/2018   LDLDIRECT 137.0 08/10/2010   TRIG 107 12/25/2018   CHOLHDL 3.1 12/25/2018   Lab Results  Component Value Date   HGBA1C 6.3 (H) 05/26/2023   Lab Results  Component Value Date   VITAMINB12 265 05/26/2023   Lab Results  Component Value Date   TSH 1.61 05/26/2023       ASSESSMENT AND PLAN 77 y.o. year old female  has a past medical history of Acute upper respiratory infections of unspecified site, Arthritis, Carotid bruit, Carotid disease, bilateral (HCC) (01/31/2012), Cataracts, both eyes, Chronic insomnia, Diverticulosis, Dysrhythmia, Fatty liver, Heart murmur, Hiatal hernia, History of colon polyps, History of gallstones, Hyperlipidemia, Hypertension, Insomnia, controlled (01/01/2014), Internal hemorrhoids, Macular pucker, left eye, Menopause, MR (mitral regurgitation) (09/10/2014), Other acute sinusitis, Other malaise and fatigue, Paroxysmal atrial fibrillation (HCC), Pneumonia, PONV (postoperative nausea and vomiting), Pulmonary hypertension (HCC)  (09/10/2014), Pure hypercholesterolemia, Restless leg syndrome, Routine general medical examination at a health care facility, Screening for lipoid disorders, Sleep apnea, Special screening for malignant neoplasms, colon, and Uterine fibroid (09/19/1999). here with significant  No diagnosis found.   Krystelle continues to have difficulty with insomnia but feels it is well managed on low dose Ambien. We will continue Ambien 2.5mg  every night. Safety concerns discussed with using this medication. PDMP shows appropriate refills. Last filled 11/08/2022 for 60 days. She will follow up with cardiology today to discuss elevated BP readings. She is asymptomatic. Healthy lifestyle duration, well-balanced diet and regular exercise encouraged.  She will continue to follow-up closely with her primary care provider.  She will follow-up in 6 months, sooner if needed.  She verbalizes understanding and agreement with this plan.  No orders of the defined types were placed in this encounter.    No orders of the defined types were placed in this encounter.    Shawnie Dapper, FNP-C 09/05/2023, 12:24 PM [facility] [address], [address]  [place], [place] [address] [phone]

## 2023-09-06 ENCOUNTER — Encounter: Payer: Self-pay | Admitting: Family Medicine

## 2023-09-06 ENCOUNTER — Ambulatory Visit: Payer: PPO | Admitting: Family Medicine

## 2023-09-06 VITALS — BP 127/74 | HR 59 | Ht 63.0 in | Wt 187.4 lb

## 2023-09-06 DIAGNOSIS — R2689 Other abnormalities of gait and mobility: Secondary | ICD-10-CM

## 2023-09-06 DIAGNOSIS — F418 Other specified anxiety disorders: Secondary | ICD-10-CM

## 2023-09-06 DIAGNOSIS — F5104 Psychophysiologic insomnia: Secondary | ICD-10-CM | POA: Diagnosis not present

## 2023-09-06 DIAGNOSIS — I480 Paroxysmal atrial fibrillation: Secondary | ICD-10-CM

## 2023-10-07 ENCOUNTER — Other Ambulatory Visit: Payer: Self-pay | Admitting: Cardiology

## 2023-10-07 DIAGNOSIS — I480 Paroxysmal atrial fibrillation: Secondary | ICD-10-CM

## 2023-10-08 NOTE — Telephone Encounter (Signed)
Prescription refill request for Eliquis received. Indication: a fib Last office visit: 03/02/23 Scr:1.07 epic 05/26/23 Age: 77 Weight: 85kg

## 2023-10-16 DIAGNOSIS — H02833 Dermatochalasis of right eye, unspecified eyelid: Secondary | ICD-10-CM | POA: Diagnosis not present

## 2023-10-16 DIAGNOSIS — H35371 Puckering of macula, right eye: Secondary | ICD-10-CM | POA: Diagnosis not present

## 2023-10-16 DIAGNOSIS — H02836 Dermatochalasis of left eye, unspecified eyelid: Secondary | ICD-10-CM | POA: Diagnosis not present

## 2023-10-16 DIAGNOSIS — H26491 Other secondary cataract, right eye: Secondary | ICD-10-CM | POA: Diagnosis not present

## 2023-10-19 ENCOUNTER — Ambulatory Visit
Admission: RE | Admit: 2023-10-19 | Discharge: 2023-10-19 | Disposition: A | Discharge status: Home / Self Care | Payer: PPO | Source: Ambulatory Visit | Attending: Family Medicine | Admitting: Family Medicine

## 2023-10-19 ENCOUNTER — Encounter: Payer: Self-pay | Admitting: Family Medicine

## 2023-10-19 ENCOUNTER — Encounter: Payer: Self-pay | Admitting: *Deleted

## 2023-10-19 ENCOUNTER — Ambulatory Visit (INDEPENDENT_AMBULATORY_CARE_PROVIDER_SITE_OTHER): Payer: PPO | Admitting: Family Medicine

## 2023-10-19 VITALS — BP 130/70 | HR 63 | Temp 97.5°F | Ht 63.0 in | Wt 187.0 lb

## 2023-10-19 DIAGNOSIS — B9789 Other viral agents as the cause of diseases classified elsewhere: Secondary | ICD-10-CM

## 2023-10-19 DIAGNOSIS — M5135 Other intervertebral disc degeneration, thoracolumbar region: Secondary | ICD-10-CM

## 2023-10-19 DIAGNOSIS — M4316 Spondylolisthesis, lumbar region: Secondary | ICD-10-CM | POA: Diagnosis not present

## 2023-10-19 DIAGNOSIS — J040 Acute laryngitis: Secondary | ICD-10-CM

## 2023-10-19 DIAGNOSIS — R296 Repeated falls: Secondary | ICD-10-CM

## 2023-10-19 DIAGNOSIS — M542 Cervicalgia: Secondary | ICD-10-CM

## 2023-10-19 DIAGNOSIS — M5030 Other cervical disc degeneration, unspecified cervical region: Secondary | ICD-10-CM

## 2023-10-19 DIAGNOSIS — M545 Low back pain, unspecified: Secondary | ICD-10-CM

## 2023-10-19 DIAGNOSIS — M47816 Spondylosis without myelopathy or radiculopathy, lumbar region: Secondary | ICD-10-CM | POA: Diagnosis not present

## 2023-10-19 DIAGNOSIS — G8929 Other chronic pain: Secondary | ICD-10-CM

## 2023-10-19 DIAGNOSIS — M546 Pain in thoracic spine: Secondary | ICD-10-CM

## 2023-10-19 DIAGNOSIS — I700 Atherosclerosis of aorta: Secondary | ICD-10-CM | POA: Diagnosis not present

## 2023-10-19 MED ORDER — CYCLOBENZAPRINE HCL 5 MG PO TABS: 5.0000 mg | ORAL_TABLET | Freq: Three times a day (TID) | ORAL | 1 refills | Status: AC | PRN

## 2023-10-19 MED ORDER — PREDNISONE 20 MG PO TABS: ORAL_TABLET | ORAL | 0 refills | Status: AC

## 2023-10-19 NOTE — Progress Notes (Signed)
[name] T. Tyrisha Benninger, MD, CAQ Sports Medicine [facility] [address] [place], [address]  Phone: [phone]  FAX: [phone]  [name] - 77 y.o. female  MRN [id number]  Date of Birth: 30-Aug-1946  Date: 10/19/2023  PCP: Mort Sawyers, FNP  Referral: Mort Sawyers, FNP  Chief Complaint  Patient presents with  . Back Pain  . Laryngitis   Subjective:   [name] is a 77 y.o. very pleasant female patient with Body mass index is 33.13 kg/m. who presents with the following:  Patient presents in follow-up after a fall, prior ER visit on August 12, 2023.  She has had multiple mechanical falls in the last 6 months.  At that time, she was trying to find the edge of her bed and she fell.  She has continued to have pain since then, and now she feels as if this is unbearable.  She is chronically on Eliquis for atrial fibrillation.  She has no history of prior spinal intervention and no history of spinal surgery.  She describes pain essentially from the cervical spine all the way through the sacrum.  It is worse in the cervical spine and the lumbar spine.  She does not describe any numbness, tingling, radiating pain, or focal weakness.  Had an appointment with Dr. Danielle Dess and cancelled  At baseline, she does not have good balance.  Has taken some meds - took some percocet.  Took some muscle relaxers some.  Some NSAIDS -she is on Eliquis  Also fell in August, and she reports she fell an additional time, for a total of 3 falls in the last 6 months  No PT, no chiro    Review of Systems is noted in the HPI, as appropriate  Objective:   BP 130/70 (BP Location: Left Arm, Patient Position: Sitting, Cuff Size: Normal)   Pulse 63   Temp (!) 97.5 F (36.4 C) (Temporal)   Ht 5\' 3"  (1.6 m)   Wt 187 lb (84.8 kg)   SpO2 98%   BMI 33.13 kg/m   GEN: No acute distress; alert,appropriate. PULM: Breathing comfortably in no  respiratory distress PSYCH: Normally interactive.    CERVICAL SPINE EXAM Range of motion: Flexion, extension, lateral bending, and rotation: Approaching full Spurling's: Negative Pain with terminal motion: Minimal Spinous Processes: NT SCM: NT Upper paracervical muscles: Mild tenderness to palpation Upper traps: NT C5-T1 intact, sensation and motor    Range of motion at  the waist: Flexion, extension, lateral bending and rotation: Full range of motion in flexion, extension, lateral bending and rotational maneuvers  No echymosis or edema Rises to examination table with mild difficulty Gait: minimally antalgic  Inspection/Deformity: N Paraspinus Tenderness: She at least has mild tenderness from the upper cervical spinal to the lumbar spine with worse pain roughly L3-S1 bilaterally  B Ankle Dorsiflexion (L5,4): 5/5 B Great Toe Dorsiflexion (L5,4): 5/5 Heel Walk (L5): WNL Toe Walk (S1): WNL Rise/Squat (L4): WNL, mild pain  SENSORY B Medial Foot (L4): WNL B Dorsum (L5): WNL B Lateral (S1): WNL Light Touch: WNL Pinprick: WNL  REFLEXES Knee (L4): 2+ Ankle (S1): 2+  B SLR, seated: neg B SLR, supine: Back pain without radiculopathy B FABER: Back pain B Reverse FABER: neg B Greater Troch: NT B Log Roll: neg B Sciatic Notch: NT    HIP EXAM: SIDE: Bilateral ROM: Abduction, Flexion, Internal and External range of motion: Full Pain with terminal IROM and EROM: None Piriformis:  NT at direct palpation Str: flexion: 3/5 abduction: 4-/5 adduction: 3+/5 Strength testing non-tender    Laboratory and Imaging Data: CLINICAL DATA:  Fall, head and neck pain   EXAM: CT HEAD WITHOUT CONTRAST   CT CERVICAL SPINE WITHOUT CONTRAST   TECHNIQUE: Multidetector CT imaging of the head and cervical spine was performed following the standard protocol without intravenous contrast. Multiplanar CT image reconstructions of the cervical spine were also generated.   RADIATION DOSE  REDUCTION: This exam was performed according to the departmental dose-optimization program which includes automated exposure control, adjustment of the mA and/or kV according to patient size and/or use of iterative reconstruction technique.   COMPARISON:  07/02/2023   FINDINGS: CT HEAD FINDINGS   Brain: No evidence of acute infarction, hemorrhage, hydrocephalus, extra-axial collection or mass lesion/mass effect.   Vascular: No hyperdense vessel or unexpected calcification.   Skull: Normal. Negative for fracture or focal lesion.   Sinuses/Orbits: No acute finding.   Other: None.   CT CERVICAL SPINE FINDINGS   Alignment: Degenerative straightening of the normal cervical lordosis.   Skull base and vertebrae: No acute fracture. No primary bone lesion or focal pathologic process.   Soft tissues and spinal canal: No prevertebral fluid or swelling. No visible canal hematoma.   Disc levels: Severe disc space height loss and osteophytosis from C4-C6 with otherwise relatively preserved disc spaces.   Upper chest: Negative.   Other: None.   IMPRESSION: 1. No acute intracranial pathology. 2. No fracture or static subluxation of the cervical spine. 3. Severe disc space height loss and osteophytosis from C4-C6 with otherwise relatively preserved disc spaces.     Electronically Signed   By: Jearld Lesch M.D.   On: 08/12/2023 18:46  CLINICAL DATA:  [id number]. Fall injury. Larey Seat out of bed during the night. Notes upper thoracic back pain.   EXAM: THORACIC SPINE 2 VIEWS   COMPARISON:  PA and lateral chest 02/25/2009   FINDINGS: Osteopenia. No spinal compression fracture is seen, although please note the upper third of the thoracic spine not well evaluated due superimposition of the patient's arms and shoulders.   There is mild-to-moderate increased thoracic kyphosis, slight levoscoliosis.   Thoracic discs are degenerated, with moderate spondylosis.  Interval cholecystectomy since 2010.   IMPRESSION: Osteopenia, increased kyphosis and degenerative change. No spinal compression fracture is seen, although please note the upper third of the thoracic spine not well evaluated due to superimposition of the patient's arms and shoulders.   If there is strong concern for occult fracture, CT thoracic spine is recommended.     Electronically Signed   By: Almira Bar M.D.   On: 08/12/2023 21:30    CLINICAL DATA:  Fall from bed.   EXAM: CT THORACIC SPINE WITHOUT CONTRAST   TECHNIQUE: Multidetector CT images of the thoracic were obtained using the standard protocol without intravenous contrast.   RADIATION DOSE REDUCTION: This exam was performed according to the departmental dose-optimization program which includes automated exposure control, adjustment of the mA and/or kV according to patient size and/or use of iterative reconstruction technique.   COMPARISON:  None Available.   FINDINGS: Alignment: Normal.   Vertebrae: Chronic superior endplate fracture at T4. Generalized osteopenia.   Paraspinal and other soft tissues: Negative.   Disc levels: Midthoracic disc collapse and endplate degeneration with sclerosis.   IMPRESSION: No acute finding.     Electronically Signed   By: Tiburcio Pea M.D.   On: 08/13/2023 06:36      Assessment  and Plan:     ICD-10-CM   1. Acute midline low back pain without sciatica  M54.50 DG Lumbar Spine Complete    Ambulatory referral to Physical Therapy    2. Chronic neck pain  M54.2 Ambulatory referral to Physical Therapy   G89.29     3. Chronic midline low back pain without sciatica  M54.50 Ambulatory referral to Physical Therapy   G89.29     4. Chronic midline thoracic back pain  M54.6 Ambulatory referral to Physical Therapy   G89.29     5. Degenerative disc disease, cervical  M50.30 Ambulatory referral to Physical Therapy    6. DDD (degenerative disc disease),  thoracolumbar  M51.35 Ambulatory referral to Physical Therapy    7. Frequent falls  R29.6 Ambulatory referral to Physical Therapy    8. Viral laryngitis  J04.0    B97.89      I independently reviewed the patient's lumbar spine film.  She does have multilevel degenerative disc disease, and I do not appreciate any kind of acute fracture or other change.  Acute on chronic cervical, thoracic, and lumbar spine pain with exacerbation after acute fall.  She does not have any neurological changes on exam.  She is fairly remarkably weak about the hip.  She has had frequent falls which is a major health concern for the patient at her age.  I am going to have her do some formal physical therapy for strengthening and fall prevention.  I would anticipate this will also help with her pain management.  I am going to give her a course of some prednisone and some as needed Flexeril.  She will follow-up with me in 8 weeks  Medication Management during today's office visit: Meds ordered this encounter  Medications  . predniSONE (DELTASONE) 20 MG tablet    Sig: 2 tabs po for 7 days, then 1 tab po for 7 days    Dispense:  21 tablet    Refill:  0  . cyclobenzaprine (FLEXERIL) 5 MG tablet    Sig: Take 1 tablet (5 mg total) by mouth 3 (three) times daily as needed for muscle spasms.    Dispense:  50 tablet    Refill:  1   Medications Discontinued During This Encounter  Medication Reason  . lidocaine (LIDODERM) 5 % Not covered by the pt's insurance  . cyclobenzaprine (FLEXERIL) 5 MG tablet     Orders placed today for conditions managed today: Orders Placed This Encounter  Procedures  . DG Lumbar Spine Complete  . Ambulatory referral to Physical Therapy    Disposition: Return in about 8 weeks (around 12/14/2023) for Dr. Patsy Lager, follow-up back pain.  [employer] One speech-to-text software was used for transcription in this dictation.  Possible transcriptional errors can occur using Barista.   Signed,  Elpidio Galea. Maxie Slovacek, MD   Outpatient Encounter Medications as of 10/19/2023  Medication Sig  . amLODipine (NORVASC) 2.5 MG tablet TAKE 1 TABLET BY MOUTH EVERY DAY  . cyclobenzaprine (FLEXERIL) 5 MG tablet Take 1 tablet (5 mg total) by mouth 3 (three) times daily as needed for muscle spasms.  Marland Kitchen ELIQUIS 5 MG TABS tablet TAKE 1 TABLET BY MOUTH TWICE A DAY  . losartan (COZAAR) 100 MG tablet TAKE 1 TABLET BY MOUTH EVERY DAY  . metoprolol succinate (TOPROL-XL) 50 MG 24 hr tablet TAKE 1 TABLET BY MOUTH EVERY DAY  . predniSONE (DELTASONE) 20 MG tablet 2 tabs po for 7 days, then 1 tab po for  7 days  . propafenone (RYTHMOL) 300 MG tablet TAKE 1 TABLET BY MOUTH TWICE A DAY  . zolpidem (AMBIEN) 5 MG tablet Take 0.5 tablets (2.5 mg total) by mouth at bedtime.  . [DISCONTINUED] cyclobenzaprine (FLEXERIL) 5 MG tablet Take 1-2 tablets (5-10 mg total) by mouth 2 (two) times daily as needed for muscle spasms. (Patient not taking: Reported on 10/19/2023)  . [DISCONTINUED] lidocaine (LIDODERM) 5 % Place 1 patch onto the skin daily. Remove & Discard patch within 12 hours or as directed by MD (Patient not taking: Reported on 09/06/2023)   No facility-administered encounter medications on file as of 10/19/2023.

## 2023-10-26 ENCOUNTER — Encounter: Payer: Self-pay | Admitting: Pharmacist

## 2023-10-26 NOTE — Progress Notes (Signed)
Pharmacy Quality Measure Review  This patient is appearing on a report for being at risk of failing the adherence measure for cholesterol (statin) and hypertension (ACEi/ARB) medications this calendar year.   Medication: losartan 100 mg Last fill date: 07/22/23 for 90 day supply  Insurance report was not up to date. No action needed at this time.  Medication has been refilled as of 10/21/23 for 90 ds.

## 2023-10-29 ENCOUNTER — Encounter: Payer: Self-pay | Admitting: Pharmacist

## 2023-10-29 NOTE — Progress Notes (Addendum)
error 

## 2023-11-16 ENCOUNTER — Other Ambulatory Visit: Payer: Self-pay | Admitting: Family Medicine

## 2023-11-16 DIAGNOSIS — F5104 Psychophysiologic insomnia: Secondary | ICD-10-CM

## 2023-11-16 MED ORDER — ZOLPIDEM TARTRATE 5 MG PO TABS
2.5000 mg | ORAL_TABLET | Freq: Every day | ORAL | 0 refills | Status: DC
Start: 2023-11-16 — End: 2023-11-16

## 2023-11-16 MED ORDER — ZOLPIDEM TARTRATE 5 MG PO TABS
2.5000 mg | ORAL_TABLET | Freq: Every day | ORAL | 1 refills | Status: DC
Start: 2023-11-16 — End: 2024-03-07

## 2023-11-16 NOTE — Telephone Encounter (Addendum)
 See other phone note. Pt states Rx went to wrong pharmacy. I sent Rx to Dr Chalice to send to [facility]. I called [facility] and canceled the Zolpidem  Rx there. [facility] also confirmed no change to refill date of 08/14/23 and [facility] confirmed there have been no refills at that pharmacy.

## 2023-11-16 NOTE — Telephone Encounter (Signed)
 Last seen on 09/06/23 Follow up scheduled on 03/07/24 Last filled on 08/14/23 Rx pending to be signed

## 2023-11-16 NOTE — Telephone Encounter (Signed)
 Pt is requesting a refill for zolpidem (AMBIEN) 5 MG tablet .  Pharmacy: [facility] [id number]

## 2023-11-16 NOTE — Telephone Encounter (Signed)
 Pt called stating that her zolpidem (AMBIEN) 5 MG tablet was called in to the wrong pharmacy. Pt states that it was sent to [facility] and it is needing to be sent to the [facility] in [place]

## 2024-03-06 DIAGNOSIS — Z6833 Body mass index (BMI) 33.0-33.9, adult: Secondary | ICD-10-CM | POA: Diagnosis not present

## 2024-03-06 DIAGNOSIS — M4316 Spondylolisthesis, lumbar region: Secondary | ICD-10-CM | POA: Diagnosis not present

## 2024-03-07 ENCOUNTER — Ambulatory Visit: Payer: PPO | Admitting: Neurology

## 2024-03-07 VITALS — BP 144/77 | HR 61 | Ht 63.0 in | Wt 182.0 lb

## 2024-03-07 DIAGNOSIS — F5104 Psychophysiologic insomnia: Secondary | ICD-10-CM

## 2024-03-07 DIAGNOSIS — I480 Paroxysmal atrial fibrillation: Secondary | ICD-10-CM | POA: Diagnosis not present

## 2024-03-07 DIAGNOSIS — F418 Other specified anxiety disorders: Secondary | ICD-10-CM | POA: Diagnosis not present

## 2024-03-07 DIAGNOSIS — J301 Allergic rhinitis due to pollen: Secondary | ICD-10-CM

## 2024-03-07 MED ORDER — ZOLPIDEM TARTRATE 5 MG PO TABS: 2.5000 mg | ORAL_TABLET | Freq: Every evening | ORAL | 1 refills | Status: DC | PRN

## 2024-03-07 NOTE — Progress Notes (Signed)
 Provider:  Neomia Banner, MD  Primary Care Physician:  Felicita Horns, FNP [address] [place] [address]     Referring Provider: Felicita Horns, Fnp [address] [place],  [place] [address]          Chief Complaint according to patient   Patient presents with:                HISTORY OF PRESENT ILLNESS:  [name] is a 78 y.o. female patient who is here for revisit 03/07/2024 for medication renewal of ambien  2.5 mg in the setting of  chronic insomnia.  She has atrial fib, 2% of the day.  On eloquis.  Feels steady when she walks, but has back and hip pain, left side.  She feels the left leg  weaker. She has hearing loss,. She sees Dr Ellery Guthrie for back pain, he wants her to have PT.   She takes 10- 15 Ambien  doses in 30 days.  She no longer travels long distance . 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  He is in a nursing home.  He has metastatic cancer affecting bones and adrenal gland.     Chief concern according to patient :  " I just feel I have experimented enough and want to stay on Ambien ."     09/06/23 ALL:  [name] returns for follow up for chronic insomnia. She continues Ambien  2.5mg  at bedtime. She feels that she is doing well. She sleeps well some nights and others she doesn't. She has not been interested in seeing a counselor. She continues to have gait difficulty since bilateral knee replacements in 2021/2022 and right ankle replacement 12/2021. She had a fall in 06/2023 and 08/2023. She reports fall in 06/2023 was after getting her foot caught while hanging bird feeder. Fall in 08/2023 was when feeding horses and lost balance when she turned. She denies dizziness. She does have neck and back pain. She has consult with Dr Ellery Guthrie next month.    02/21/2023 ALL:  [name] returns for follow up for chronic  insomnia. She was last seen by Dr Albertina Hugger 02/2022. Ambien  5mg  QHS continued. Belsomra  given to help  wean Ambien . Referral placed to Jenna Renfew, psychology, for CBT. Since, she reports doing well. She did not feel Belsomra  was effective. She took med for about a week then returned to Ambien  2.5mg . She denies adverse effects with Ambien . She is aware of risks. She has failed multiple sleep aids an nothing works for her. She did not seek CBT. She will consider in the future.   02/16/2022 CD: [name] is a meanwhile 78 year old Caucasian female patient of our practice who has a history of obstructive sleep apnea with hypoxia her last sleep study was in March 2017, since then she has been followed for chronic insomnia.  She is able to fall asleep with a low-dose of Ambien  2.5 mg, and she has to get up 2-3 times each night to go to the bathroom but is able to go back to sleep.  She lives alone.  Her husband died in 2018-04-18.  Current medication list was reviewed evaluated she is on Eliquis  Cozaar , Toprol , Rythmol  for atrial fibrillation, again Ambien  she takes at a very low-dose and I am glad she does that.  She had 2 knee replacements and recently had an ankle surgery on the right.  So temporarily she  was on pain medication and opiates worked to put he to sleep, but she only took it for 7 days post surgery. She fought nausea as well on opiates. I had in 2019 suggested switching to Trazodone , but that failed.  She would benefit from cognitive behavior therapy- mind is racing.  ED visit : On eloquis.  Atrial fibrillation.  [name] is a 78 y.o. female presents to the emergency department for evaluation of a fall.  She fell forwards off the porch just prior to arrival.  She hit her face and her head, felt some pain in her left ankle.  She also suffered a laceration to the left elbow.  She denies any LOC nausea or vomiting but does have a slight headache.  She denies any vision changes, neck pain numbness tingling radicular symptoms.  She has some soreness to the lateral aspect of the left ankle along  mild swelling.  She has a superficial abrasion to the left knee but denies any knee pain.  She has been ambulatory since the incident.  She denies any back pain chest pain or shortness of breath or abdominal pain.  Her tetanus status is unknown.      HISTORY  01-20-2012 per Dr. Albertina Hugger' notes: [name] is a 78 female and seen here in referral  from Dr. Kirke Pepper for chronic insomnia.  Last seen in 2010  The patient will be evaluated for organic reasons of insomnia.    The patient was seen on  01-20-12  and has lost a lot of weight in the meantime. After trazodone  and Remeron did fail to produce satisfying results for now she will sometimes take a Benadryl  or Tylenol  PM to assist her with her sleep initiation. She thinks nocturia  was never the main problem. The patient states that she has lost a lot of weight intentionally and that this has helped certainly some of her other health issues including hypertension. She was diagnosed with atrial fibrillation in the past has been on chronic anticoagulation. She reported that topiramate  caused her to be drowsy and daytime when taken in the morning for that reason we will change the intake time 2 PM this may assist with her sleepiness as well as was her headaches. It has also helped her to lose weight.  At bedtime routines were reviewed : the patient was to bed around 11 PM rises at 6:30 AM depending on how much sleep she got.  She did have amnestic spells from after some nights when using Ambien - but she sleeps all much better on Ambien  but on the other medications.  The onset of her insomnia was related to the development of menopause but she only has really hot flashes now- she uses Ambien  chronically for 2 decades.   Her bedroom is described as quiet, cool and dark she shares a bedroom with her husband, who is very sick.     Review of Systems: Out of a complete 14 system review, the patient complains of only the following symptoms, and all other  reviewed systems are negative.:    How likely are you to doze in the following situations: 0 = not likely, 1 = slight chance, 2 = moderate chance, 3 = high chance  Sitting and Reading? Watching Television? Sitting inactive in a public place (theater or meeting)? Lying down in the afternoon when circumstances permit? Sitting and talking to someone? Sitting quietly after lunch without alcohol ? In a car, while stopped for a few minutes in traffic?  As a passenger in a car for an hour without a break?  Total = 1/ 24   FSS: 41/ 63   GDS : 1/ 15   Chronic pain   Caregiver burn out   Social History   Socioeconomic History   Marital status: Widowed    Spouse name: Myna Asal   Number of children: 3   Years of education: College   Highest education level: Not on file  Occupational History   Occupation: Conservator, museum/gallery    Employer: [employer]  Tobacco Use   Smoking status: Never   Smokeless tobacco: Never  Vaping Use   Vaping status: Never Used  Substance and Sexual Activity   Alcohol  use: Yes    Comment: rarely   Drug use: No   Sexual activity: Not Currently    Birth control/protection: Post-menopausal  Other Topics Concern   Not on file  Social History Narrative   Patient is widowed. Patient has three adult children.   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    Patient is right-handed.   Patient drinks two cups of caffeine daily.   Patient has a college education.   Social Drivers of Corporate investment banker Strain: Low Risk  (06/21/2022)   Overall Financial Resource Strain (CARDIA)    Difficulty of Paying Living Expenses: Not hard at all  Food Insecurity: No Food Insecurity (06/21/2022)   Hunger Vital Sign    Worried About Running Out of Food in the Last Year: Never true    Ran Out of Food in the Last Year: Never true  Transportation Needs: No Transportation Needs (06/21/2022)   PRAPARE - Scientist, research (physical sciences) (Medical): No    Lack of Transportation (Non-Medical): No  Physical Activity: Inactive (06/21/2022)   Exercise Vital Sign    Days of Exercise per Week: 0 days    Minutes of Exercise per Session: 0 min  Stress: No Stress Concern Present (06/21/2022)   [employer] of Occupational Health - Occupational Stress Questionnaire    Feeling of Stress : Not at all  Social Connections: Moderately Integrated (06/21/2022)   Social Connection and Isolation Panel [NHANES]    Frequency of Communication with Friends and Family: Three times a week    Frequency of Social Gatherings with Friends and Family: Once a week    Attends Religious Services: More than 4 times per year    Active Member of [employer] or Organizations: Yes    Attends Banker Meetings: More than 4 times per year    Marital Status: Widowed    Family History  Problem Relation Age of Onset   Breast cancer Mother    Heart disease Father    Colon cancer Cousin 17   Diabetes Other        cousins    Past Medical History:  Diagnosis Date   Arthritis    Carotid bruit    Carotid disease, bilateral (HCC) 01/31/2012   Mild noted on US  carotid   Cataracts, both eyes    Chronic insomnia    Diverticulosis    pt denies   Dysrhythmia    a fib   Fatty liver    Heart murmur    Has been told by an MD.   Hiatal hernia    Denies   History of colon polyps    Noted on Colonscopy   History of gallstones    Hyperlipidemia  Hypertension    Insomnia, controlled 01/01/2014   Internal hemorrhoids    Macular pucker, left eye    surgery planned 01-14-15   Menopause    noticed in early 50's   MR (mitral regurgitation) 09/10/2014   Mild, Noted on ECHO    Paroxysmal atrial fibrillation (HCC)    on meds   Pneumonia    as a small child   Pulmonary hypertension (HCC) 09/10/2014   Mild, Noted on ECHO   Pure hypercholesterolemia    Restless leg syndrome    Routine general medical examination at a health care  facility    Sleep apnea    no c-pap   Uterine fibroid 09/19/1999   Noted on pelvis ultrasound    Past Surgical History:  Procedure Laterality Date   cardiolyte neg  6/06   cardiovascular stress MRI  12/20/06   afib   CHOLECYSTECTOMY     COLONOSCOPY     EYE SURGERY     cataract   Toric implant bil eyes   EYE SURGERY Right 07/29/2020   Mem Peel and Vit - Dr. Seward Dao   KNEE ARTHROSCOPY     left   ORIF- L distal radius  9/07   left wrist   PARS PLANA VITRECTOMY W/ REPAIR OF MACULAR HOLE     left eye   torn meniscus     bil knees     TOTAL ANKLE ARTHROPLASTY Right 01/06/2022   Procedure: TOTAL ANKLE ARTHROPLASTY;  Surgeon: Amada Backer, MD;  Location: [facility];  Service: Orthopedics;  Laterality: Right;   TOTAL KNEE ARTHROPLASTY Left 05/31/2019   Procedure: LEFT TOTAL KNEE ARTHROPLASTY;  Surgeon: Arnie Lao, MD;  Location: [facility];  Service: Orthopedics;  Laterality: Left;   TOTAL KNEE ARTHROPLASTY Right 09/20/2019   Procedure: RIGHT TOTAL KNEE ARTHROPLASTY;  Surgeon: Arnie Lao, MD;  Location: [facility];  Service: Orthopedics;  Laterality: Right;   TUBAL LIGATION     UPPER GI ENDOSCOPY       Current Outpatient Medications on File Prior to Visit  Medication Sig Dispense Refill   amLODipine  (NORVASC ) 2.5 MG tablet TAKE 1 TABLET BY MOUTH EVERY DAY 90 tablet 3   cyclobenzaprine  (FLEXERIL ) 5 MG tablet Take 1 tablet (5 mg total) by mouth 3 (three) times daily as needed for muscle spasms. 50 tablet 1   ELIQUIS  5 MG TABS tablet TAKE 1 TABLET BY MOUTH TWICE A DAY 180 tablet 1   losartan  (COZAAR ) 100 MG tablet TAKE 1 TABLET BY MOUTH EVERY DAY 90 tablet 3   metoprolol  succinate (TOPROL -XL) 50 MG 24 hr tablet TAKE 1 TABLET BY MOUTH EVERY DAY 90 tablet 3   predniSONE  (DELTASONE ) 20 MG tablet 2 tabs po for 7 days, then 1 tab po for 7 days 21 tablet 0   propafenone  (RYTHMOL ) 300 MG tablet TAKE 1 TABLET BY MOUTH TWICE A DAY 180 tablet 3   zolpidem  (AMBIEN ) 5  MG tablet Take 0.5 tablets (2.5 mg total) by mouth at bedtime. 45 tablet 1   No current facility-administered medications on file prior to visit.    Allergies  Allergen Reactions   Codeine Nausea And Vomiting   Other Nausea And Vomiting    general anesthesia   Oxycodone  Nausea And Vomiting     DIAGNOSTIC DATA (LABS, IMAGING, TESTING) - I reviewed patient records, labs, notes, testing and imaging myself where available.  Lab Results  Component Value Date   WBC 6.1 05/26/2023   HGB 13.6 05/26/2023  HCT 40.3 05/26/2023   MCV 90.4 05/26/2023   PLT 202 05/26/2023      Component Value Date/Time   NA 138 05/26/2023 0953   NA 141 03/02/2023 1059   K 4.8 05/26/2023 0953   CL 107 05/26/2023 0953   CO2 24 05/26/2023 0953   GLUCOSE 111 (H) 05/26/2023 0953   BUN 29 (H) 05/26/2023 0953   BUN 20 03/02/2023 1059   CREATININE 1.07 (H) 05/26/2023 0953   CALCIUM 8.8 05/26/2023 0953   PROT 6.7 07/22/2020 1028   ALBUMIN 3.9 07/22/2020 1028   AST 16 07/22/2020 1028   ALT 13 07/22/2020 1028   ALKPHOS 101 07/22/2020 1028   BILITOT 0.5 07/22/2020 1028   GFRNONAA 46 (L) 07/22/2020 1028   GFRAA 53 (L) 07/22/2020 1028   Lab Results  Component Value Date   CHOL 187 12/25/2018   HDL 61 12/25/2018   LDLCALC 105 (H) 12/25/2018   LDLDIRECT 137.0 08/10/2010   TRIG 107 12/25/2018   CHOLHDL 3.1 12/25/2018   Lab Results  Component Value Date   HGBA1C 6.3 (H) 05/26/2023   Lab Results  Component Value Date   VITAMINB12 265 05/26/2023   Lab Results  Component Value Date   TSH 1.61 05/26/2023    PHYSICAL EXAM:  Today's Vitals   03/07/24 1042  BP: (!) 144/77  Pulse: 61  Weight: 182 lb (82.6 kg)  Height: 5\' 3"  (1.6 m)   Body mass index is 32.24 kg/m.   Wt Readings from Last 3 Encounters:  03/07/24 182 lb (82.6 kg)  10/19/23 187 lb (84.8 kg)  09/06/23 187 lb 6.4 oz (85 kg)     Ht Readings from Last 3 Encounters:  03/07/24 5\' 3"  (1.6 m)  10/19/23 5\' 3"  (1.6 m)  09/06/23 5'  3" (1.6 m)      General: The patient is awake, alert and appears not in acute distress. The patient is well groomed. Head: Normocephalic, atraumatic.  Neck is supple. Mallampati 2,  neck circumference:14 inches .  Nasal airflow  patent.   Overbite Myrle Aspen is seen.  Dental status: biological, crowns.  Cardiovascular:  Regular rate and cardiac rhythm by pulse,  without distended neck veins. Respiratory: Lungs are clear to auscultation.  Skin:  Without evidence of ankle edema, or rash. Trunk: The patient's posture is erect.   NEUROLOGIC EXAM: The patient is awake and alert, oriented to place and time.   Memory subjective described as intact.  Attention span & concentration ability appears normal.  Speech is fluent,  without  dysarthria, dysphonia or aphasia.  Mood and affect are appropriate.   Cranial nerves: no loss of smell or taste reported  Pupils are equal and briskly reactive to light. Funduscopic exam deferred. .  Extraocular movements in vertical and horizontal planes were intact and without nystagmus. No Diplopia. Visual fields by finger perimetry are intact. Hearing is impaired to soft voice while hearing aids are in situ, today only on the right side.   Facial sensation intact to fine touch.  Facial motor strength is symmetric and tongue and uvula move midline.  Neck ROM : rotation, tilt and flexion extension were normal for age and shoulder shrug was symmetrical.    Motor exam:  Symmetric bulk, tone and ROM.   Normal tone without cog- wheeling, symmetric grip strength .   Sensory:  Fine touch, pinprick and vibration were tested  and  normal.  Proprioception tested in the upper extremities was normal. Right  hand weakness since a fracture ,  fall in 2000.   Coordination: Rapid alternating movements in the fingers/hands were of normal speed.  The Finger-to-nose maneuver was intact without evidence of ataxia, dysmetria or tremor.   Gait and station: Patient could  rise unassisted from a seated position, walked without assistive device.  Stance is of normal width/ base and the patient turned with 4 steps.  Left leg is pointing outwards, posture  is stooped. Toe and heel walk were deferred.     ASSESSMENT AND PLAN 78 y.o. year old Caucasian widowed female  from [place], here with:    1) chronic insomnia.  Never been willing or able to  pursue  CBT for insomnia, now using 2.5 mg Ambien  most nights.   2) pain is affecting sleep- at onset , but not fragmenting her sleep, not the cause of arousals.   3)  once asleep she stays asleep until she needs to go urinate. 1-2 times each.  Unknown if she snores.   HST ordered to get a new baseline.   I plan to follow up  our NP within 6 months for AMBIEN  refills.  If her primary care would take the medication over , we wouldn't need to follow up here.   I would like to thank  Felicita Horns, Fnp [address] [place],  [place] [address] for allowing me to meet with and to take care of this pleasant patient.     After spending a total time of  35  minutes face to face and additional time for physical and neurologic examination, review of laboratory studies,  personal review of imaging studies, reports and results of other testing and review of referral information / records as far as provided in visit,   Electronically signed by: Neomia Banner, MD 03/07/2024 11:18 AM  [facility] Board certified by The [employer] of Sleep Medicine and Diplomate of the [employer] of Sleep Medicine. Board certified In Neurology through the ABPN, Fellow of the [employer] of Neurology.

## 2024-03-07 NOTE — Patient Instructions (Signed)
 Insomnia Insomnia is a sleep disorder that makes it difficult to fall asleep or stay asleep. Insomnia can cause fatigue, low energy, difficulty concentrating, mood swings, and poor performance at work or school. There are three different ways to classify insomnia: Difficulty falling asleep. Difficulty staying asleep. Waking up too early in the morning. Any type of insomnia can be long-term (chronic) or short-term (acute). Both are common. Short-term insomnia usually lasts for 3 months or less. Chronic insomnia occurs at least three times a week for longer than 3 months. What are the causes? Insomnia may be caused by another condition, situation, or substance, such as: Having certain mental health conditions, such as anxiety and depression. Using caffeine, alcohol , tobacco, or drugs. Having gastrointestinal conditions, such as gastroesophageal reflux disease (GERD). Having certain medical conditions. These include: Asthma. Alzheimer's disease. Stroke. Chronic pain. An overactive thyroid  gland (hyperthyroidism). Other sleep disorders, such as restless legs syndrome and sleep apnea. Menopause. Sometimes, the cause of insomnia may not be known. What increases the risk? Risk factors for insomnia include: Gender. Females are affected more often than males. Age. Insomnia is more common as people get older. Stress and certain medical and mental health conditions. Lack of exercise. Having an irregular work schedule. This may include working night shifts and traveling between different time zones. What are the signs or symptoms? If you have insomnia, the main symptom is having trouble falling asleep or having trouble staying asleep. This may lead to other symptoms, such as: Feeling tired or having low energy. Feeling nervous about going to sleep. Not feeling rested in the morning. Having trouble concentrating. Feeling irritable, anxious, or depressed. How is this diagnosed? This  condition may be diagnosed based on: Your symptoms and medical history. Your health care provider may ask about: Your sleep habits. Any medical conditions you have. Your mental health. A physical exam. How is this treated? Treatment for insomnia depends on the cause. Treatment may focus on treating an underlying condition that is causing the insomnia. Treatment may also include: Medicines to help you sleep. Counseling or therapy. Lifestyle adjustments to help you sleep better. Follow these instructions at home: Eating and drinking  Limit or avoid alcohol , caffeinated beverages, and products that contain nicotine and tobacco, especially close to bedtime. These can disrupt your sleep. Do not eat a large meal or eat spicy foods right before bedtime. This can lead to digestive discomfort that can make it hard for you to sleep. Sleep habits  Keep a sleep diary to help you and your health care provider figure out what could be causing your insomnia. Write down: When you sleep. When you wake up during the night. How well you sleep and how rested you feel the next day. Any side effects of medicines you are taking. What you eat and drink. Make your bedroom a dark, comfortable place where it is easy to fall asleep. Put up shades or blackout curtains to block light from outside. Use a white noise machine to block noise. Keep the temperature cool. Limit screen use before bedtime. This includes: Not watching TV. Not using your smartphone, tablet, or computer. Stick to a routine that includes going to bed and waking up at the same times every day and night. This can help you fall asleep faster. Consider making a quiet activity, such as reading, part of your nighttime routine. Try to avoid taking naps during the day so that you sleep better at night. Get out of bed if you are still awake  after 15 minutes of trying to sleep. Keep the lights down, but try reading or doing a quiet activity. When you  feel sleepy, go back to bed. General instructions Take over-the-counter and prescription medicines only as told by your health care provider. Exercise regularly as told by your health care provider. However, avoid exercising in the hours right before bedtime. Use relaxation techniques to manage stress. Ask your health care provider to suggest some techniques that may work well for you. These may include: Breathing exercises. Routines to release muscle tension. Visualizing peaceful scenes. Make sure that you drive carefully. Do not drive if you feel very sleepy. Keep all follow-up visits. This is important. Contact a health care provider if: You are tired throughout the day. You have trouble in your daily routine due to sleepiness. You continue to have sleep problems, or your sleep problems get worse. Get help right away if: You have thoughts about hurting yourself or someone else. Get help right away if you feel like you may hurt yourself or others, or have thoughts about taking your own life. Go to your nearest emergency room or: Call 911. Call the National Suicide Prevention Lifeline at 681-282-7267 or 988. This is open 24 hours a day. Text the Crisis Text Line at 561-458-3646. Summary Insomnia is a sleep disorder that makes it difficult to fall asleep or stay asleep. Insomnia can be long-term (chronic) or short-term (acute). Treatment for insomnia depends on the cause. Treatment may focus on treating an underlying condition that is causing the insomnia. Keep a sleep diary to help you and your health care provider figure out what could be causing your insomnia. This information is not intended to replace advice given to you by your health care provider. Make sure you discuss any questions you have with your health care provider. Document Revised: 10/04/2021 Document Reviewed: 10/04/2021 Elsevier Patient Education  2024 Elsevier Inc.Healthy Living: Sleep In this video, you will learn why  sleep is an important part of a healthy lifestyle. To view the content, go to this web address: https://pe.elsevier.com/[email or web address]  This video will expire on: 10/18/2025. If you need access to this video following this date, please reach out to the healthcare provider who assigned it to you. This information is not intended to replace advice given to you by your health care provider. Make sure you discuss any questions you have with your health care provider. Elsevier Patient Education  2024 [employer].

## 2024-03-11 ENCOUNTER — Telehealth: Payer: Self-pay | Admitting: Neurology

## 2024-03-11 NOTE — Telephone Encounter (Signed)
 HST- HTA pending

## 2024-03-17 ENCOUNTER — Other Ambulatory Visit: Payer: Self-pay | Admitting: Cardiology

## 2024-03-26 NOTE — Telephone Encounter (Signed)
 HST HTA Siegfried Dress: [id number] (exp. 03/11/24 to 06/09/24)

## 2024-04-03 DIAGNOSIS — M4316 Spondylolisthesis, lumbar region: Secondary | ICD-10-CM | POA: Diagnosis not present

## 2024-04-11 NOTE — Telephone Encounter (Signed)
 Patient does not want to schedule HST at this time. Has too much going on. Pt will call back when she is ready.

## 2024-04-18 ENCOUNTER — Other Ambulatory Visit: Payer: Self-pay | Admitting: Cardiology

## 2024-05-01 DIAGNOSIS — Z6832 Body mass index (BMI) 32.0-32.9, adult: Secondary | ICD-10-CM | POA: Diagnosis not present

## 2024-05-01 DIAGNOSIS — M4316 Spondylolisthesis, lumbar region: Secondary | ICD-10-CM | POA: Diagnosis not present

## 2024-05-15 ENCOUNTER — Other Ambulatory Visit: Payer: Self-pay | Admitting: Cardiology

## 2024-05-15 DIAGNOSIS — I480 Paroxysmal atrial fibrillation: Secondary | ICD-10-CM

## 2024-05-15 NOTE — Telephone Encounter (Signed)
 Pt last saw Josefa Beauvais, NP on 03/02/23, pt is overdue for follow-up.  Recall in Epic, msg sent to schedulers to contact pt to schedule follow-up appt.

## 2024-05-16 ENCOUNTER — Telehealth: Payer: Self-pay

## 2024-05-16 NOTE — Telephone Encounter (Signed)
 Pt scheduled for OV on 05/30/24.

## 2024-05-16 NOTE — Telephone Encounter (Signed)
   Pre-operative Risk Assessment    Patient Name: [name]  DOB: 07/14/1946 MRN: [id number]   Date of last office visit: 03/02/23 JOSEFA BEAUVAIS, NP Date of next office visit: NONE   Request for Surgical Clearance    Procedure:  TRANSLAMINAR INJECTION  Date of Surgery:  Clearance TBD                                Surgeon:  DR VICTORY GENS Surgeon's Group or Practice Name:  [facility] Phone number:  [phone]  EXT [phone] Fax number:  [phone]  OR   [phone] ATTN: NIKKI   Type of Clearance Requested:   - Medical  - Pharmacy:  Hold Apixaban  (Eliquis )     Type of Anesthesia:  MAC   Additional requests/questions:    SignedLucie DELENA Ku   05/16/2024, 1:48 PM

## 2024-05-16 NOTE — Telephone Encounter (Signed)
   Name: [name]  DOB: [birth year]  MRN: [id number]  Primary Cardiologist: Peter Swaziland, MD  Chart reviewed as part of pre-operative protocol coverage. Because of [name]'s past medical history and time since last visit, she will require a follow-up in-office visit in order to better assess preoperative cardiovascular risk.  Pre-op covering staff: - Please schedule appointment and call patient to inform them. If patient already had an upcoming appointment within acceptable timeframe, please add pre-op clearance to the appointment notes so provider is aware. - Please contact requesting surgeon's office via preferred method (i.e, phone, fax) to inform them of need for appointment prior to surgery.  This message will also be routed to pharmacy pool for input on holding Eliquis  as requested below so that this information is available to the clearing provider at time of patient's appointment.   [name] [name], NP  05/16/2024, 2:23 PM

## 2024-05-17 NOTE — Telephone Encounter (Signed)
 Pt has appt scheduled for 05/30/24 with Jon Hails, PA.  Age 78, weight 82.6kg, last labs 05/26/23 Creat 1.07, based on specified criteria pt is on appropriate dosage of Eliquis  5mg  BID for afib.  Will refill rx to get pt to upcoming appt.

## 2024-05-20 DIAGNOSIS — M4316 Spondylolisthesis, lumbar region: Secondary | ICD-10-CM | POA: Insufficient documentation

## 2024-05-20 NOTE — Telephone Encounter (Signed)
   Patient Name: [name]  DOB: July 08, 1946 MRN: [id number]  Primary Cardiologist: Peter Swaziland, MD  Chart reviewed as part of pre-operative protocol coverage. Per pharmacist, patient will need labs at upcoming OV to help guide anticoag recs. Will cc Angie Duke seeing patient 05/30/24 as FYI to loop back into pharm once labs are obtained. I will cc this note to requesting procedural team so they are aware appointment forthcoming.  Anthone Prieur N Donzell Coller, PA-C 05/20/2024, 11:53 AM

## 2024-05-20 NOTE — Telephone Encounter (Signed)
 Patient with diagnosis of A Fib on Eliquis  for anticoagulation.  Last OV 03/02/23  Procedure: TRANSLAMINAR INJECTION   Date of procedure: TBD   CHA2DS2-VASc Score = 5  This indicates a 7.2% annual risk of stroke. The patient's score is based upon: CHF History: 0 HTN History: 1 Diabetes History: 0 Stroke History: 0 Vascular Disease History: 1 Age Score: 2 Gender Score: 1    CrCl Overdue Platelet count Overdue   Per office protocol, patient can hold ... for ... days prior to procedure.     **This guidance is not considered finalized until pre-operative APP has relayed final recommendations.**

## 2024-05-24 ENCOUNTER — Encounter: Payer: Self-pay | Admitting: Advanced Practice Midwife

## 2024-05-29 NOTE — Progress Notes (Addendum)
 Cardiology Office Note:    Date:  05/30/2024   ID:  [name], [name] 12-29-1945, MRN [id number]  PCP:  Corwin Antu, FNP   [facility] Providers Cardiologist:  Peter Swaziland, MD Cardiology APP:  Madie Jon Garre, PA     Referring MD: Corwin Antu, FNP   Chief Complaint  Patient presents with   Follow-up   Pre-op Exam    History of Present Illness:    [name] is a 78 y.o. female with a hx of paroxysmal atrial fibrillation anticoagulated with Eliquis , hypertension, carotid artery stenosis, OSA, hiatal hernia, hyperlipidemia, fatty liver disease, obesity, anxiety/depression.  She has a history of orthopedic surgeries.  In 2015, nuclear stress test for dyspnea showed normal perfusion.  Follow-up echocardiogram with preserved LVEF and mild MR.  She was noted to have mild pulmonary hypertension.  She has done well on Rythmol  and metoprolol .  She presents today for preoperative risk evaluation prior to translaminar injection and Eliquis  hold.  She requires updated labs to determine Eliquis  hold.  She remains active with taking care of mowing and weed eating (30 min). She can also climb a flight of stairs in her home (did so yesterday). She lives alone and takes care of her house and yard. She has no cardiac complaints. Her watch alerts her to 2% Afib and she states she can have Afib for a couple of hours at a time. She questions coming off of eliquis  and propafenone . She also states she takes 2.5 mg amlodipine  intermittently when SBP in the 140s, less than once weekly.   Past Medical History:  Diagnosis Date   Arthritis    Carotid bruit    Carotid disease, bilateral (HCC) 01/31/2012   Mild noted on US  carotid   Cataracts, both eyes    Chronic insomnia    Diverticulosis    pt denies   Dysrhythmia    a fib   Fatty liver    Heart murmur    Has been told by an MD.   Hiatal hernia    Denies   History of colon polyps    Noted on Colonscopy   History  of gallstones    Hyperlipidemia    Hypertension    Insomnia, controlled 01/01/2014   Internal hemorrhoids    Macular pucker, left eye    surgery planned 01-14-15   Menopause    noticed in early 50's   MR (mitral regurgitation) 09/10/2014   Mild, Noted on ECHO    Paroxysmal atrial fibrillation (HCC)    on meds   Pneumonia    as a small child   Pulmonary hypertension (HCC) 09/10/2014   Mild, Noted on ECHO   Pure hypercholesterolemia    Restless leg syndrome    Routine general medical examination at a health care facility    Sleep apnea    no c-pap   Uterine fibroid 09/19/1999   Noted on pelvis ultrasound    Past Surgical History:  Procedure Laterality Date   cardiolyte neg  6/06   cardiovascular stress MRI  12/20/06   afib   CHOLECYSTECTOMY     COLONOSCOPY     EYE SURGERY     cataract   Toric implant bil eyes   EYE SURGERY Right 07/29/2020   Mem Peel and Vit - Dr. Elner   KNEE ARTHROSCOPY     left   ORIF- L distal radius  9/07   left wrist   PARS PLANA VITRECTOMY W/ REPAIR OF MACULAR HOLE  left eye   torn meniscus     bil knees     TOTAL ANKLE ARTHROPLASTY Right 01/06/2022   Procedure: TOTAL ANKLE ARTHROPLASTY;  Surgeon: Kit Rush, MD;  Location: [facility];  Service: Orthopedics;  Laterality: Right;   TOTAL KNEE ARTHROPLASTY Left 05/31/2019   Procedure: LEFT TOTAL KNEE ARTHROPLASTY;  Surgeon: Vernetta Lonni GRADE, MD;  Location: [facility];  Service: Orthopedics;  Laterality: Left;   TOTAL KNEE ARTHROPLASTY Right 09/20/2019   Procedure: RIGHT TOTAL KNEE ARTHROPLASTY;  Surgeon: Vernetta Lonni GRADE, MD;  Location: [facility];  Service: Orthopedics;  Laterality: Right;   TUBAL LIGATION     UPPER GI ENDOSCOPY      Current Medications: Current Meds  Medication Sig   cyclobenzaprine  (FLEXERIL ) 5 MG tablet Take 1 tablet (5 mg total) by mouth 3 (three) times daily as needed for muscle spasms.   metoprolol  succinate (TOPROL -XL) 50 MG 24 hr tablet TAKE 1  TABLET BY MOUTH EVERY DAY   olmesartan  (BENICAR ) 40 MG tablet Take 1 tablet (40 mg total) by mouth daily.   propafenone  (RYTHMOL ) 300 MG tablet TAKE 1 TABLET BY MOUTH TWICE A DAY   zolpidem  (AMBIEN ) 5 MG tablet Take 0.5 tablets (2.5 mg total) by mouth at bedtime as needed for sleep.   [DISCONTINUED] apixaban  (ELIQUIS ) 5 MG TABS tablet Take 1 tablet (5 mg total) by mouth 2 (two) times daily. Overdue for follow-up, MUST see provider for FUTURE refills.   [DISCONTINUED] losartan  (COZAAR ) 100 MG tablet TAKE 1 TABLET BY MOUTH EVERY DAY     Allergies:   Codeine, Other, and Oxycodone    Social History   Socioeconomic History   Marital status: Widowed    Spouse name: Rosalita   Number of children: 3   Years of education: College   Highest education level: Not on file  Occupational History   Occupation: Conservator, museum/gallery    Employer: [employer]  Tobacco Use   Smoking status: Never   Smokeless tobacco: Never  Vaping Use   Vaping status: Never Used  Substance and Sexual Activity   Alcohol  use: Yes    Comment: rarely   Drug use: No   Sexual activity: Not Currently    Birth control/protection: Post-menopausal  Other Topics Concern   Not on file  Social History Narrative   Patient is widowed. Patient has three adult children.   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    Patient is right-handed.   Patient drinks two cups of caffeine daily.   Patient has a college education.   Social Drivers of Corporate investment banker Strain: Low Risk  (06/21/2022)   Overall Financial Resource Strain (CARDIA)    Difficulty of Paying Living Expenses: Not hard at all  Food Insecurity: No Food Insecurity (06/21/2022)   Hunger Vital Sign    Worried About Running Out of Food in the Last Year: Never true    Ran Out of Food in the Last Year: Never true  Transportation Needs: No Transportation Needs (06/21/2022)   PRAPARE - Administrator, Civil Service  (Medical): No    Lack of Transportation (Non-Medical): No  Physical Activity: Inactive (06/21/2022)   Exercise Vital Sign    Days of Exercise per Week: 0 days    Minutes of Exercise per Session: 0 min  Stress: No Stress Concern Present (06/21/2022)   [employer] of Occupational Health - Occupational Stress Questionnaire    Feeling  of Stress : Not at all  Social Connections: Moderately Integrated (06/21/2022)   Social Connection and Isolation Panel    Frequency of Communication with Friends and Family: Three times a week    Frequency of Social Gatherings with Friends and Family: Once a week    Attends Religious Services: More than 4 times per year    Active Member of [employer] or Organizations: Yes    Attends Banker Meetings: More than 4 times per year    Marital Status: Widowed     Family History: The patient's family history includes Breast cancer in her mother; Colon cancer (age of onset: 40) in her cousin; Diabetes in an other family member; Heart disease in her father.  ROS:   Please see the history of present illness.     All other systems reviewed and are negative.  EKGs/Labs/Other Studies Reviewed:    The following studies were reviewed today:  EKG Interpretation Date/Time:  Thursday May 30 2024 14:05:47 EDT Ventricular Rate:  67 PR Interval:  226 QRS Duration:  114 QT Interval:  422 QTC Calculation: 445 R Axis:   71  Text Interpretation: Sinus rhythm with 1st degree A-V block When compared with ECG of 15-Nov-2009 23:33, PR interval has increased QRS duration has increased Confirmed by Madie Slough ([id number]) on 05/30/2024 2:08:40 PM    Recent Labs: No results found for requested labs within last 365 days.  Recent Lipid Panel    Component Value Date/Time   CHOL 187 12/25/2018 1415   TRIG 107 12/25/2018 1415   HDL 61 12/25/2018 1415   CHOLHDL 3.1 12/25/2018 1415   CHOLHDL 3.8 09/24/2015 1214   VLDL 25 09/24/2015 1214   LDLCALC 105 (H) 12/25/2018  1415   LDLDIRECT 137.0 08/10/2010 0845     Risk Assessment/Calculations:    CHA2DS2-VASc Score = 5  This indicates a 7.2% annual risk of stroke. The patient's score is based upon: CHF History: 0 HTN History: 1 Diabetes History: 0 Stroke History: 0 Vascular Disease History: 1 Age Score: 2 Gender Score: 1              Physical Exam:    VS:  BP 130/70   Pulse 67   Ht 5' 3 (1.6 m)   Wt 177 lb (80.3 kg)   SpO2 97%   BMI 31.35 kg/m     Wt Readings from Last 3 Encounters:  05/30/24 177 lb (80.3 kg)  03/07/24 182 lb (82.6 kg)  10/19/23 187 lb (84.8 kg)     GEN:  Well nourished, well developed in no acute distress HEENT: Normal NECK: No JVD; No carotid bruits LYMPHATICS: No lymphadenopathy CARDIAC: RRR, no murmurs, rubs, gallops RESPIRATORY:  Clear to auscultation without rales, wheezing or rhonchi  ABDOMEN: Soft, non-tender, non-distended MUSCULOSKELETAL:  No edema; No deformity  SKIN: Warm and dry NEUROLOGIC:  Alert and oriented x 3 PSYCHIATRIC:  Normal affect   ASSESSMENT:    1. Essential hypertension   2. Paroxysmal atrial fibrillation (HCC)   3. Pre-op evaluation   4. Medication management   5. Chronic anticoagulation    PLAN:    In order of problems listed above:  Paroxysmal atrial fibrillation - Managed with metoprolol  and propafenone  -- EKG with SR today -- will continue BB and propafenone    Chronic anticoagulation - Anticoagulated with 5 mg Eliquis  twice daily -- will collect CBC, BMP today   Hypertension - Maintained on 100 mg losartan , she is not taking amlodipine  regularly, only if BP  elevated  - less than once per week -- BP elevates to 147 systolic prompting amlodipine  -- will D/C amlodipine  and switch losartan  to 40 mg olmesartan  -- BMP in 2 weeks   Preoperative risk evaluation for Mace prior to translaminar injection According to the RCRI she has a 0.6% risk of Mace. She is able to complete more than 4.0 METS without angina.  She may proceed with back injection without additional cardiac testing.    Will update CBC and BMP to make final recommendations regarding Eliquis  hold.    ADDENDUM: May hold eliquis  three days prior to procedure.            Medication Adjustments/Labs and Tests Ordered: Current medicines are reviewed at length with the patient today.  Concerns regarding medicines are outlined above.  Orders Placed This Encounter  Procedures   Basic Metabolic Panel (BMET)   CBC   Basic Metabolic Panel (BMET)   EKG 12-Lead   Meds ordered this encounter  Medications   olmesartan  (BENICAR ) 40 MG tablet    Sig: Take 1 tablet (40 mg total) by mouth daily.    Dispense:  90 tablet    Refill:  3   apixaban  (ELIQUIS ) 5 MG TABS tablet    Sig: Take 1 tablet (5 mg total) by mouth 2 (two) times daily. Overdue for follow-up, MUST see provider for FUTURE refills.    Dispense:  180 tablet    Refill:  3    Patient Instructions  Medication Instructions:  Your physician has recommended you make the following change in your medication:  STOP LOSARTAN  STOP AMLODIPINE  START OLMESARTAN  40 MG DAILY.  *If you need a refill on your cardiac medications before your next appointment, please call your pharmacy*  Lab Work: TODAY: BMET, CBC  TO BE DONE IN 2 WEEKS: BMET  If you have labs (blood work) drawn today and your tests are completely normal, you will receive your results only by: MyChart Message (if you have MyChart) OR A paper copy in the mail If you have any lab test that is abnormal or we need to change your treatment, we will call you to review the results.  Testing/Procedures: NONE  Follow-Up: At [facility], you and your health needs are our priority.  As part of our continuing mission to provide you with exceptional heart care, our providers are all part of one team.  This team includes your primary Cardiologist (physician) and Advanced Practice Providers or APPs (Physician  Assistants and Nurse Practitioners) who all work together to provide you with the care you need, when you need it.  Your next appointment:   1 year(s)  Provider:   Peter Swaziland, MD   We recommend signing up for the patient portal called MyChart.  Sign up information is provided on this After Visit Summary.  MyChart is used to connect with patients for Virtual Visits (Telemedicine).  Patients are able to view lab/test results, encounter notes, upcoming appointments, etc.  Non-urgent messages can be sent to your provider as well.   To learn more about what you can do with MyChart, go to [email or web address].          Signed, Jon Nat Hails, [place]  05/30/2024 2:41 PM    [facility]

## 2024-05-30 ENCOUNTER — Ambulatory Visit: Attending: Physician Assistant | Admitting: Physician Assistant

## 2024-05-30 ENCOUNTER — Encounter: Payer: Self-pay | Admitting: Physician Assistant

## 2024-05-30 VITALS — BP 130/70 | HR 67 | Ht 63.0 in | Wt 177.0 lb

## 2024-05-30 DIAGNOSIS — I10 Essential (primary) hypertension: Secondary | ICD-10-CM | POA: Diagnosis not present

## 2024-05-30 DIAGNOSIS — Z79899 Other long term (current) drug therapy: Secondary | ICD-10-CM | POA: Diagnosis not present

## 2024-05-30 DIAGNOSIS — Z01818 Encounter for other preprocedural examination: Secondary | ICD-10-CM | POA: Diagnosis not present

## 2024-05-30 DIAGNOSIS — I480 Paroxysmal atrial fibrillation: Secondary | ICD-10-CM

## 2024-05-30 DIAGNOSIS — Z7901 Long term (current) use of anticoagulants: Secondary | ICD-10-CM | POA: Diagnosis not present

## 2024-05-30 MED ORDER — APIXABAN 5 MG PO TABS
5.0000 mg | ORAL_TABLET | Freq: Two times a day (BID) | ORAL | 3 refills | Status: AC
Start: 2024-05-30 — End: ?

## 2024-05-30 MED ORDER — OLMESARTAN MEDOXOMIL 40 MG PO TABS
40.0000 mg | ORAL_TABLET | Freq: Every day | ORAL | 3 refills | Status: AC
Start: 2024-05-30 — End: ?

## 2024-05-30 NOTE — Patient Instructions (Signed)
 Medication Instructions:  Your physician has recommended you make the following change in your medication:  STOP LOSARTAN  STOP AMLODIPINE  START OLMESARTAN  40 MG DAILY.  *If you need a refill on your cardiac medications before your next appointment, please call your pharmacy*  Lab Work: TODAY: BMET, CBC  TO BE DONE IN 2 WEEKS: BMET  If you have labs (blood work) drawn today and your tests are completely normal, you will receive your results only by: MyChart Message (if you have MyChart) OR A paper copy in the mail If you have any lab test that is abnormal or we need to change your treatment, we will call you to review the results.  Testing/Procedures: NONE  Follow-Up: At [facility], you and your health needs are our priority.  As part of our continuing mission to provide you with exceptional heart care, our providers are all part of one team.  This team includes your primary Cardiologist (physician) and Advanced Practice Providers or APPs (Physician Assistants and Nurse Practitioners) who all work together to provide you with the care you need, when you need it.  Your next appointment:   1 year(s)  Provider:   Peter Swaziland, MD   We recommend signing up for the patient portal called MyChart.  Sign up information is provided on this After Visit Summary.  MyChart is used to connect with patients for Virtual Visits (Telemedicine).  Patients are able to view lab/test results, encounter notes, upcoming appointments, etc.  Non-urgent messages can be sent to your provider as well.   To learn more about what you can do with MyChart, go to [email or web address].

## 2024-05-31 ENCOUNTER — Ambulatory Visit: Payer: Self-pay | Admitting: Physician Assistant

## 2024-05-31 LAB — CBC
Hematocrit: 40.6 % (ref 34.0–46.6)
Hemoglobin: 13.6 g/dL (ref 11.1–15.9)
MCH: 30.5 pg (ref 26.6–33.0)
MCHC: 33.5 g/dL (ref 31.5–35.7)
MCV: 91 fL (ref 79–97)
Platelets: 204 x10E3/uL (ref 150–450)
RBC: 4.46 x10E6/uL (ref 3.77–5.28)
RDW: 13 % (ref 11.7–15.4)
WBC: 7.1 x10E3/uL (ref 3.4–10.8)

## 2024-05-31 LAB — BASIC METABOLIC PANEL WITH GFR
BUN/Creatinine Ratio: 13 (ref 12–28)
BUN: 13 mg/dL (ref 8–27)
CO2: 23 mmol/L (ref 20–29)
Calcium: 9.1 mg/dL (ref 8.7–10.3)
Chloride: 104 mmol/L (ref 96–106)
Creatinine, Ser: 1.03 mg/dL — AB (ref 0.57–1.00)
Glucose: 62 mg/dL — AB (ref 70–99)
Potassium: 4.4 mmol/L (ref 3.5–5.2)
Sodium: 142 mmol/L (ref 134–144)
eGFR: 55 mL/min/1.73 — AB (ref >59)

## 2024-05-31 NOTE — Telephone Encounter (Signed)
 Patient with diagnosis of afib on Eliquis  for anticoagulation.    Procedure: TRANSLAMINAR INJECTION  Date of procedure: TBD   CHA2DS2-VASc Score = 5   This indicates a 7.2% annual risk of stroke. The patient's score is based upon: CHF History: 0 HTN History: 1 Diabetes History: 0 Stroke History: 0 Vascular Disease History: 1 Age Score: 2 Gender Score: 1      CrCl 44 ml/min Platelet count 204  Patient has not had an Afib/aflutter ablation within the last 3 months or DCCV within the last 30 days  Per office protocol, patient can hold Eliquis  for 3 days prior to procedure.    **This guidance is not considered finalized until pre-operative APP has relayed final recommendations.**

## 2024-06-25 DIAGNOSIS — H26491 Other secondary cataract, right eye: Secondary | ICD-10-CM | POA: Diagnosis not present

## 2024-06-25 DIAGNOSIS — G4733 Obstructive sleep apnea (adult) (pediatric): Secondary | ICD-10-CM | POA: Diagnosis not present

## 2024-06-25 DIAGNOSIS — H02833 Dermatochalasis of right eye, unspecified eyelid: Secondary | ICD-10-CM | POA: Diagnosis not present

## 2024-06-25 DIAGNOSIS — H02836 Dermatochalasis of left eye, unspecified eyelid: Secondary | ICD-10-CM | POA: Diagnosis not present

## 2024-06-25 DIAGNOSIS — Z961 Presence of intraocular lens: Secondary | ICD-10-CM | POA: Diagnosis not present

## 2024-06-25 DIAGNOSIS — H35371 Puckering of macula, right eye: Secondary | ICD-10-CM | POA: Diagnosis not present

## 2024-07-02 DIAGNOSIS — M5416 Radiculopathy, lumbar region: Secondary | ICD-10-CM | POA: Diagnosis not present

## 2024-07-02 DIAGNOSIS — M5116 Intervertebral disc disorders with radiculopathy, lumbar region: Secondary | ICD-10-CM | POA: Diagnosis not present

## 2024-10-07 ENCOUNTER — Other Ambulatory Visit: Payer: Self-pay | Admitting: General Practice

## 2024-10-09 ENCOUNTER — Telehealth: Payer: Self-pay | Admitting: Neurology

## 2024-10-09 NOTE — Telephone Encounter (Signed)
 appointment r/s due to conflict, pt on wait list

## 2024-10-15 ENCOUNTER — Ambulatory Visit: Admitting: Family Medicine

## 2024-10-18 ENCOUNTER — Encounter: Payer: Self-pay | Admitting: Pharmacist

## 2024-10-18 NOTE — Progress Notes (Signed)
 Pharmacy Quality Measure Review  This patient is appearing on a report for being at risk of failing the adherence measure for hypertension (ACEi/ARB) medications this calendar year.   Medication: olmesartan  40 mg Last fill date: 05/30/24 for 90 day supply  Contacted pharmacy to facilitate refills.

## 2024-10-27 ENCOUNTER — Other Ambulatory Visit: Payer: Self-pay | Admitting: Neurology

## 2024-10-27 DIAGNOSIS — F5104 Psychophysiologic insomnia: Secondary | ICD-10-CM

## 2024-11-08 ENCOUNTER — Telehealth: Payer: Self-pay | Admitting: Cardiology

## 2024-11-08 NOTE — Telephone Encounter (Signed)
" °*  STAT* If patient is at the pharmacy, call can be transferred to refill team.   1. Which medications need to be refilled? (please list name of each medication and dose if known)   metoprolol  succinate (TOPROL -XL) 50 MG 24 hr tablet     2. Would you like to learn more about the convenience, safety, & potential cost savings by using the [facility]?   3. Are you open to using the [facility] (Type [facility].    4. Which pharmacy/location (including street and city if local pharmacy) is medication to be sent to? [id number] - [place], [place] - [address]     5. Do they need a 30 day or 90 day supply? 90  PT is out of meds. PT stated she misplaced prescription over the holidays. Has concerns on if insurance will cover prescription since she recently just got a refill.   "

## 2024-11-09 ENCOUNTER — Other Ambulatory Visit: Payer: Self-pay | Admitting: General Practice

## 2024-11-11 ENCOUNTER — Other Ambulatory Visit: Payer: Self-pay

## 2024-11-11 MED ORDER — METOPROLOL SUCCINATE ER 50 MG PO TB24: 50.0000 mg | ORAL_TABLET | Freq: Every day | ORAL | 0 refills | Status: AC

## 2025-05-12 ENCOUNTER — Ambulatory Visit: Admitting: Family Medicine
